# Patient Record
Sex: Male | Born: 1970 | Hispanic: No | Marital: Single | Smoking: Current every day smoker
Health system: Southern US, Community
[De-identification: ages and names within clinical notes are randomized; demographics above are authoritative.]

## PROBLEM LIST (undated history)

## (undated) DIAGNOSIS — E119 Type 2 diabetes mellitus without complications: Secondary | ICD-10-CM

## (undated) DIAGNOSIS — E785 Hyperlipidemia, unspecified: Secondary | ICD-10-CM

## (undated) DIAGNOSIS — I639 Cerebral infarction, unspecified: Secondary | ICD-10-CM

---

## 1999-03-10 ENCOUNTER — Emergency Department (HOSPITAL_COMMUNITY): Admission: EM | Admit: 1999-03-10 | Discharge: 1999-03-10 | Payer: Self-pay | Admitting: Emergency Medicine

## 1999-03-10 ENCOUNTER — Encounter: Payer: Self-pay | Admitting: Emergency Medicine

## 1999-03-21 ENCOUNTER — Encounter: Admission: RE | Admit: 1999-03-21 | Discharge: 1999-03-21 | Payer: Self-pay | Admitting: *Deleted

## 2005-01-24 ENCOUNTER — Emergency Department (HOSPITAL_COMMUNITY): Admission: EM | Admit: 2005-01-24 | Discharge: 2005-01-24 | Payer: Self-pay | Admitting: Emergency Medicine

## 2016-08-19 ENCOUNTER — Emergency Department: Payer: Self-pay

## 2016-08-19 ENCOUNTER — Emergency Department
Admission: EM | Admit: 2016-08-19 | Discharge: 2016-08-19 | Disposition: A | Discharge status: Home / Self Care | Payer: Self-pay | Attending: Emergency Medicine | Admitting: Emergency Medicine

## 2016-08-19 ENCOUNTER — Encounter: Payer: Self-pay | Admitting: Emergency Medicine

## 2016-08-19 DIAGNOSIS — F17200 Nicotine dependence, unspecified, uncomplicated: Secondary | ICD-10-CM | POA: Insufficient documentation

## 2016-08-19 DIAGNOSIS — E0800 Diabetes mellitus due to underlying condition with hyperosmolarity without nonketotic hyperglycemic-hyperosmolar coma (NKHHC): Secondary | ICD-10-CM

## 2016-08-19 DIAGNOSIS — E1100 Type 2 diabetes mellitus with hyperosmolarity without nonketotic hyperglycemic-hyperosmolar coma (NKHHC): Secondary | ICD-10-CM | POA: Insufficient documentation

## 2016-08-19 DIAGNOSIS — L03211 Cellulitis of face: Secondary | ICD-10-CM | POA: Insufficient documentation

## 2016-08-19 HISTORY — DX: Type 2 diabetes mellitus without complications: E11.9

## 2016-08-19 LAB — CBC WITH DIFFERENTIAL/PLATELET
Basophils Absolute: 0.1 10*3/uL (ref 0–0.1)
Basophils Relative: 1 %
Eosinophils Absolute: 0.7 10*3/uL (ref 0–0.7)
Eosinophils Relative: 5 %
HCT: 48.3 % (ref 40.0–52.0)
Hemoglobin: 16.5 g/dL (ref 13.0–18.0)
Lymphocytes Relative: 13 %
Lymphs Abs: 1.9 10*3/uL (ref 1.0–3.6)
MCH: 30.4 pg (ref 26.0–34.0)
MCHC: 34.2 g/dL (ref 32.0–36.0)
MCV: 88.9 fL (ref 80.0–100.0)
Monocytes Absolute: 0.8 10*3/uL (ref 0.2–1.0)
Monocytes Relative: 6 %
Neutro Abs: 10.6 10*3/uL — ABNORMAL HIGH (ref 1.4–6.5)
Neutrophils Relative %: 75 %
Platelets: 347 10*3/uL (ref 150–440)
RBC: 5.43 MIL/uL (ref 4.40–5.90)
RDW: 13.2 % (ref 11.5–14.5)
WBC: 14.2 10*3/uL — ABNORMAL HIGH (ref 3.8–10.6)

## 2016-08-19 LAB — BASIC METABOLIC PANEL
Anion gap: 7 (ref 5–15)
BUN: 12 mg/dL (ref 6–20)
CO2: 28 mmol/L (ref 22–32)
Calcium: 8.9 mg/dL (ref 8.9–10.3)
Chloride: 101 mmol/L (ref 101–111)
Creatinine, Ser: 0.83 mg/dL (ref 0.61–1.24)
GFR calc Af Amer: >60 mL/min (ref >60)
GFR calc non Af Amer: >60 mL/min (ref >60)
Glucose, Bld: 315 mg/dL — ABNORMAL HIGH (ref 65–99)
Potassium: 3.9 mmol/L (ref 3.5–5.1)
Sodium: 136 mmol/L (ref 135–145)

## 2016-08-19 MED ORDER — INSULIN ASPART 100 UNIT/ML ~~LOC~~ SOLN
20.0000 [IU] | Freq: Once | SUBCUTANEOUS | Status: AC
  Administered 2016-08-19: 20 [IU] via INTRAVENOUS
  Filled 2016-08-19: qty 20

## 2016-08-19 MED ORDER — METFORMIN HCL 500 MG PO TABS: 500.0000 mg | ORAL_TABLET | Freq: Two times a day (BID) | ORAL | 11 refills | Status: DC

## 2016-08-19 MED ORDER — IOPAMIDOL (ISOVUE-300) INJECTION 61%
75.0000 mL | Freq: Once | INTRAVENOUS | Status: AC | PRN
  Administered 2016-08-19: 75 mL via INTRAVENOUS
  Filled 2016-08-19: qty 75

## 2016-08-19 MED ORDER — OXYCODONE-ACETAMINOPHEN 5-325 MG PO TABS: 1.0000 | ORAL_TABLET | Freq: Four times a day (QID) | ORAL | 0 refills | Status: AC | PRN

## 2016-08-19 MED ORDER — OXYCODONE-ACETAMINOPHEN 5-325 MG PO TABS
1.0000 | ORAL_TABLET | Freq: Once | ORAL | Status: AC
  Administered 2016-08-19: 1 via ORAL
  Filled 2016-08-19: qty 1

## 2016-08-19 MED ORDER — SODIUM CHLORIDE 0.9 % IV BOLUS (SEPSIS)
1000.0000 mL | Freq: Once | INTRAVENOUS | Status: AC
  Administered 2016-08-19: 1000 mL via INTRAVENOUS

## 2016-08-19 MED ORDER — IBUPROFEN 600 MG PO TABS
600.0000 mg | ORAL_TABLET | Freq: Once | ORAL | Status: AC
  Administered 2016-08-19: 600 mg via ORAL
  Filled 2016-08-19: qty 1

## 2016-08-19 MED ORDER — IBUPROFEN 600 MG PO TABS: 600.0000 mg | ORAL_TABLET | Freq: Three times a day (TID) | ORAL | 0 refills | Status: DC | PRN

## 2016-08-19 NOTE — ED Provider Notes (Signed)
[facility] Emergency Department Provider Note   ____________________________________________   None    (approximate)  I have reviewed the triage vital signs and the nursing notes.   HISTORY  Chief Complaint Abscess    HPI Phillip Camacho is a 45 y.o. male patient presents to the ED with right inferior lateral facial edema. Patient state the edema started on Tuesday.Patient saw her PCP yesterday and was given Rocephin 1 g IM. Patient also was given antibiotics. Patient states no improvement since yesterday. Patient rates pain as a 6/10.   Past Medical History:  Diagnosis Date  . Diabetes mellitus without complication ([place])     There are no active problems to display for this patient.   History reviewed. No pertinent surgical history.  Prior to Admission medications   Medication Sig Start Date End Date Taking? Authorizing Provider  ibuprofen (ADVIL,MOTRIN) 600 MG tablet Take 1 tablet (600 mg total) by mouth every 8 (eight) hours as needed. 08/19/16   Sable Feil, PA-C  metFORMIN (GLUCOPHAGE) 500 MG tablet Take 1 tablet (500 mg total) by mouth 2 (two) times daily with a meal. 08/19/16 08/19/17  Sable Feil, PA-C  oxyCODONE-acetaminophen (ROXICET) 5-325 MG tablet Take 1 tablet by mouth every 6 (six) hours as needed. 08/19/16 08/19/17  Sable Feil, PA-C    Allergies Review of patient's allergies indicates no known allergies.  No family history on file.  Social History Social History  Substance Use Topics  . Smoking status: Current Every Day Smoker  . Smokeless tobacco: Never Used  . Alcohol use No    Review of Systems Constitutional: No fever/chills Eyes: No visual changes. ENT: No sore throat. Cardiovascular: Denies chest pain. Respiratory: Denies shortness of breath. Gastrointestinal: No abdominal pain.  No nausea, no vomiting.  No diarrhea.  No constipation. Genitourinary: Negative for dysuria. Musculoskeletal: Negative for  back pain. Skin: Negative for rash. Neurological: Negative for headaches, focal weakness or numbness. Endocrine:Diabetes ___________________________________________   PHYSICAL EXAM:  VITAL SIGNS: ED Triage Vitals  Enc Vitals Group     BP 08/19/16 1212 (!) 181/72     Pulse Rate 08/19/16 1212 96     Resp 08/19/16 1212 16     Temp 08/19/16 1212 98.7 F (37.1 C)     Temp Source 08/19/16 1212 Oral     SpO2 08/19/16 1212 98 %     Weight 08/19/16 1211 136 lb (61.7 kg)     Height 08/19/16 1211 5\' 6"  (1.676 m)     Head Circumference --      Peak Flow --      Pain Score 08/19/16 1212 6     Pain Loc --      Pain Edu? --      Excl. in [place]? --     Constitutional: Alert and oriented. Well appearing and in no acute distress. Eyes: Conjunctivae are normal. PERRL. EOMI. Head: Atraumatic. Nose: No congestion/rhinnorhea. Mouth/Throat: Mucous membranes are moist.  Oropharynx non-erythematous.Edema to the right lateral mandible. Neck: No stridor.  No cervical spine tenderness to palpation. Hematological/Lymphatic/Immunilogical: No cervical lymphadenopathy. Cardiovascular: Normal rate, regular rhythm. Grossly normal heart sounds.  Good peripheral circulation.Elevated blood pressure Respiratory: Normal respiratory effort.  No retractions. Lungs CTAB. Gastrointestinal: Soft and nontender. No distention. No abdominal bruits. No CVA tenderness. Musculoskeletal: No lower extremity tenderness nor edema.  No joint effusions. Neurologic:  Normal speech and language. No gross focal neurologic deficits are appreciated. No gait instability. Skin:  Skin is warm,  dry and intact. No rash noted. Psychiatric: Mood and affect are normal. Speech and behavior are normal.  ____________________________________________   LABS (all labs ordered are listed, but only abnormal results are displayed)  Labs Reviewed  BASIC METABOLIC PANEL - Abnormal; Notable for the following:       Result Value   Glucose, Bld 315  (*)    All other components within normal limits  CBC WITH DIFFERENTIAL/PLATELET - Abnormal; Notable for the following:    WBC 14.2 (*)    Neutro Abs 10.6 (*)    All other components within normal limits   ____________________________________________  EKG   ____________________________________________  RADIOLOGY  Maxillofacial CT with contrast is consistent with cellulitis but no true abscess. ____________________________________________   PROCEDURES  Procedure(s) performed: None  Procedures  Critical Care performed: No  ____________________________________________   INITIAL IMPRESSION / ASSESSMENT AND PLAN / ED COURSE  Pertinent labs & imaging results that were available during my care of the patient were reviewed by me and considered in my medical decision making (see chart for details).  Facial cellulitis. Patient advised to continue antibiotics. Patient given discharge care instructions. Patient given a prescription for Percocet, ibuprofen, and metformin. [facility]  Clinical Course   Patient CT scan consistent with cellulitis but no true abscess. Patient relates that he has not been on metformin for 2-3 months. His elevated glucose patient was given sodium chloride bolus 20 units insulin.  ____________________________________________   FINAL CLINICAL IMPRESSION(S) / ED DIAGNOSES  Final diagnoses:  Facial cellulitis  Diabetes mellitus due to underlying condition with hyperosmolarity without coma, without long-term current use of insulin (HCC)      NEW MEDICATIONS STARTED DURING THIS VISIT:  New Prescriptions   IBUPROFEN (ADVIL,MOTRIN) 600 MG TABLET    Take 1 tablet (600 mg total) by mouth every 8 (eight) hours as needed.   METFORMIN (GLUCOPHAGE) 500 MG TABLET    Take 1 tablet (500 mg total) by mouth 2 (two) times daily with a meal.   OXYCODONE-ACETAMINOPHEN (ROXICET) 5-325 MG TABLET    Take 1  tablet by mouth every 6 (six) hours as needed.     Note:  This document was prepared using Dragon voice recognition software and may include unintentional dictation errors.    Sable Feil, PA-C 08/19/16 [place], PA-C 08/19/16 [place], MD 08/23/16 [phone]

## 2016-08-19 NOTE — ED Triage Notes (Signed)
Presents with swelling to right side of face   States swelling started on tuesday  Became worse yesterday   Was seen by PCP   Given rocephin shot and po antibiotics

## 2016-08-19 NOTE — Discharge Instructions (Signed)
Continue previous medications. Advised to [facility] to [facility] care medical conditions.

## 2019-08-11 ENCOUNTER — Emergency Department (HOSPITAL_COMMUNITY): Payer: BLUE CROSS/BLUE SHIELD

## 2019-08-11 ENCOUNTER — Encounter (HOSPITAL_COMMUNITY): Payer: Self-pay

## 2019-08-11 ENCOUNTER — Other Ambulatory Visit: Payer: Self-pay

## 2019-08-11 ENCOUNTER — Inpatient Hospital Stay (HOSPITAL_COMMUNITY): Payer: BLUE CROSS/BLUE SHIELD

## 2019-08-11 ENCOUNTER — Inpatient Hospital Stay (HOSPITAL_COMMUNITY)
Admission: EM | Admit: 2019-08-11 | Discharge: 2019-08-14 | DRG: 040 | Disposition: A | Discharge status: Home / Self Care | Payer: BLUE CROSS/BLUE SHIELD | Attending: Internal Medicine | Admitting: Internal Medicine

## 2019-08-11 DIAGNOSIS — I63413 Cerebral infarction due to embolism of bilateral middle cerebral arteries: Principal | ICD-10-CM | POA: Diagnosis present

## 2019-08-11 DIAGNOSIS — R471 Dysarthria and anarthria: Secondary | ICD-10-CM | POA: Diagnosis present

## 2019-08-11 DIAGNOSIS — Z972 Presence of dental prosthetic device (complete) (partial): Secondary | ICD-10-CM | POA: Diagnosis not present

## 2019-08-11 DIAGNOSIS — N179 Acute kidney failure, unspecified: Secondary | ICD-10-CM | POA: Diagnosis present

## 2019-08-11 DIAGNOSIS — Z7984 Long term (current) use of oral hypoglycemic drugs: Secondary | ICD-10-CM

## 2019-08-11 DIAGNOSIS — E1169 Type 2 diabetes mellitus with other specified complication: Secondary | ICD-10-CM

## 2019-08-11 DIAGNOSIS — E1122 Type 2 diabetes mellitus with diabetic chronic kidney disease: Secondary | ICD-10-CM | POA: Diagnosis present

## 2019-08-11 DIAGNOSIS — N183 Chronic kidney disease, stage 3 unspecified: Secondary | ICD-10-CM | POA: Diagnosis present

## 2019-08-11 DIAGNOSIS — E1165 Type 2 diabetes mellitus with hyperglycemia: Secondary | ICD-10-CM | POA: Diagnosis present

## 2019-08-11 DIAGNOSIS — F17210 Nicotine dependence, cigarettes, uncomplicated: Secondary | ICD-10-CM | POA: Diagnosis present

## 2019-08-11 DIAGNOSIS — J90 Pleural effusion, not elsewhere classified: Secondary | ICD-10-CM | POA: Diagnosis present

## 2019-08-11 DIAGNOSIS — N184 Chronic kidney disease, stage 4 (severe): Secondary | ICD-10-CM | POA: Insufficient documentation

## 2019-08-11 DIAGNOSIS — I129 Hypertensive chronic kidney disease with stage 1 through stage 4 chronic kidney disease, or unspecified chronic kidney disease: Secondary | ICD-10-CM | POA: Diagnosis present

## 2019-08-11 DIAGNOSIS — E785 Hyperlipidemia, unspecified: Secondary | ICD-10-CM | POA: Diagnosis present

## 2019-08-11 DIAGNOSIS — I639 Cerebral infarction, unspecified: Secondary | ICD-10-CM | POA: Diagnosis present

## 2019-08-11 DIAGNOSIS — I361 Nonrheumatic tricuspid (valve) insufficiency: Secondary | ICD-10-CM | POA: Diagnosis not present

## 2019-08-11 DIAGNOSIS — Z720 Tobacco use: Secondary | ICD-10-CM | POA: Diagnosis present

## 2019-08-11 DIAGNOSIS — E876 Hypokalemia: Secondary | ICD-10-CM | POA: Diagnosis present

## 2019-08-11 DIAGNOSIS — R739 Hyperglycemia, unspecified: Secondary | ICD-10-CM

## 2019-08-11 DIAGNOSIS — I700 Atherosclerosis of aorta: Secondary | ICD-10-CM | POA: Diagnosis present

## 2019-08-11 DIAGNOSIS — N189 Chronic kidney disease, unspecified: Secondary | ICD-10-CM | POA: Diagnosis present

## 2019-08-11 DIAGNOSIS — Z9114 Patient's other noncompliance with medication regimen: Secondary | ICD-10-CM

## 2019-08-11 DIAGNOSIS — E119 Type 2 diabetes mellitus without complications: Secondary | ICD-10-CM

## 2019-08-11 DIAGNOSIS — R29810 Facial weakness: Secondary | ICD-10-CM | POA: Diagnosis present

## 2019-08-11 DIAGNOSIS — R29705 NIHSS score 5: Secondary | ICD-10-CM | POA: Diagnosis present

## 2019-08-11 DIAGNOSIS — G9341 Metabolic encephalopathy: Secondary | ICD-10-CM | POA: Diagnosis present

## 2019-08-11 DIAGNOSIS — Z20828 Contact with and (suspected) exposure to other viral communicable diseases: Secondary | ICD-10-CM | POA: Diagnosis present

## 2019-08-11 DIAGNOSIS — I160 Hypertensive urgency: Secondary | ICD-10-CM | POA: Diagnosis present

## 2019-08-11 DIAGNOSIS — G8194 Hemiplegia, unspecified affecting left nondominant side: Secondary | ICD-10-CM | POA: Diagnosis present

## 2019-08-11 DIAGNOSIS — I10 Essential (primary) hypertension: Secondary | ICD-10-CM

## 2019-08-11 DIAGNOSIS — I340 Nonrheumatic mitral (valve) insufficiency: Secondary | ICD-10-CM | POA: Diagnosis not present

## 2019-08-11 HISTORY — DX: Type 2 diabetes mellitus with diabetic chronic kidney disease: E11.22

## 2019-08-11 LAB — I-STAT CHEM 8, ED
BUN: 22 mg/dL — ABNORMAL HIGH (ref 6–20)
Calcium, Ion: 1.15 mmol/L (ref 1.15–1.40)
Chloride: 96 mmol/L — ABNORMAL LOW (ref 98–111)
Creatinine, Ser: 2.6 mg/dL — ABNORMAL HIGH (ref 0.61–1.24)
Glucose, Bld: 440 mg/dL — ABNORMAL HIGH (ref 70–99)
HCT: 43 % (ref 39.0–52.0)
Hemoglobin: 14.6 g/dL (ref 13.0–17.0)
Potassium: 3.3 mmol/L — ABNORMAL LOW (ref 3.5–5.1)
Sodium: 133 mmol/L — ABNORMAL LOW (ref 135–145)
TCO2: 27 mmol/L (ref 22–32)

## 2019-08-11 LAB — DIFFERENTIAL
Abs Immature Granulocytes: 0.04 10*3/uL (ref 0.00–0.07)
Basophils Absolute: 0.1 10*3/uL (ref 0.0–0.1)
Basophils Relative: 1 %
Eosinophils Absolute: 0.6 10*3/uL — ABNORMAL HIGH (ref 0.0–0.5)
Eosinophils Relative: 5 %
Immature Granulocytes: 0 %
Lymphocytes Relative: 17 %
Lymphs Abs: 1.9 10*3/uL (ref 0.7–4.0)
Monocytes Absolute: 0.6 10*3/uL (ref 0.1–1.0)
Monocytes Relative: 5 %
Neutro Abs: 8.1 10*3/uL — ABNORMAL HIGH (ref 1.7–7.7)
Neutrophils Relative %: 72 %

## 2019-08-11 LAB — ETHANOL: Alcohol, Ethyl (B): <10 mg/dL (ref <10)

## 2019-08-11 LAB — RAPID URINE DRUG SCREEN, HOSP PERFORMED
Amphetamines: NOT DETECTED
Barbiturates: NOT DETECTED
Benzodiazepines: NOT DETECTED
Cocaine: NOT DETECTED
Opiates: NOT DETECTED
Tetrahydrocannabinol: NOT DETECTED

## 2019-08-11 LAB — URINALYSIS, ROUTINE W REFLEX MICROSCOPIC
Bacteria, UA: NONE SEEN
Bilirubin Urine: NEGATIVE
Glucose, UA: >=500 mg/dL — AB
Ketones, ur: NEGATIVE mg/dL
Leukocytes,Ua: NEGATIVE
Nitrite: NEGATIVE
Protein, ur: >=300 mg/dL — AB
Specific Gravity, Urine: 1.011 (ref 1.005–1.030)
pH: 7 (ref 5.0–8.0)

## 2019-08-11 LAB — CBG MONITORING, ED
Glucose-Capillary: 434 mg/dL — ABNORMAL HIGH (ref 70–99)
Glucose-Capillary: 297 mg/dL — ABNORMAL HIGH (ref 70–99)

## 2019-08-11 LAB — CBC
HCT: 41.7 % (ref 39.0–52.0)
Hemoglobin: 13.4 g/dL (ref 13.0–17.0)
MCH: 28 pg (ref 26.0–34.0)
MCHC: 32.1 g/dL (ref 30.0–36.0)
MCV: 87.1 fL (ref 80.0–100.0)
Platelets: 494 10*3/uL — ABNORMAL HIGH (ref 150–400)
RBC: 4.79 MIL/uL (ref 4.22–5.81)
RDW: 14 % (ref 11.5–15.5)
WBC: 11.3 10*3/uL — ABNORMAL HIGH (ref 4.0–10.5)
nRBC: 0 % (ref 0.0–0.2)

## 2019-08-11 LAB — COMPREHENSIVE METABOLIC PANEL
ALT: 10 U/L (ref 0–44)
AST: 7 U/L — ABNORMAL LOW (ref 15–41)
Albumin: 1.8 g/dL — ABNORMAL LOW (ref 3.5–5.0)
Alkaline Phosphatase: 112 U/L (ref 38–126)
Anion gap: 6 (ref 5–15)
BUN: 22 mg/dL — ABNORMAL HIGH (ref 6–20)
CO2: 27 mmol/L (ref 22–32)
Calcium: 8.1 mg/dL — ABNORMAL LOW (ref 8.9–10.3)
Chloride: 97 mmol/L — ABNORMAL LOW (ref 98–111)
Creatinine, Ser: 2.72 mg/dL — ABNORMAL HIGH (ref 0.61–1.24)
GFR calc Af Amer: 31 mL/min — ABNORMAL LOW (ref >60)
GFR calc non Af Amer: 26 mL/min — ABNORMAL LOW (ref >60)
Glucose, Bld: 446 mg/dL — ABNORMAL HIGH (ref 70–99)
Potassium: 3.2 mmol/L — ABNORMAL LOW (ref 3.5–5.1)
Sodium: 130 mmol/L — ABNORMAL LOW (ref 135–145)
Total Bilirubin: 0.3 mg/dL (ref 0.3–1.2)
Total Protein: 5.7 g/dL — ABNORMAL LOW (ref 6.5–8.1)

## 2019-08-11 LAB — BRAIN NATRIURETIC PEPTIDE: B Natriuretic Peptide: 591.6 pg/mL — ABNORMAL HIGH (ref 0.0–100.0)

## 2019-08-11 LAB — PROTIME-INR
INR: 0.8 (ref 0.8–1.2)
Prothrombin Time: 11.4 seconds (ref 11.4–15.2)

## 2019-08-11 LAB — SARS CORONAVIRUS 2 (TAT 6-24 HRS): SARS Coronavirus 2: NEGATIVE

## 2019-08-11 LAB — GLUCOSE, CAPILLARY: Glucose-Capillary: 226 mg/dL — ABNORMAL HIGH (ref 70–99)

## 2019-08-11 LAB — T4, FREE: Free T4: 1.08 ng/dL (ref 0.61–1.12)

## 2019-08-11 LAB — TROPONIN I (HIGH SENSITIVITY)
Troponin I (High Sensitivity): 17 ng/L (ref <18)
Troponin I (High Sensitivity): 18 ng/L — ABNORMAL HIGH (ref <18)

## 2019-08-11 LAB — APTT: aPTT: 31 seconds (ref 24–36)

## 2019-08-11 LAB — TSH: TSH: 2.16 u[IU]/mL (ref 0.350–4.500)

## 2019-08-11 MED ORDER — HEPARIN SODIUM (PORCINE) 5000 UNIT/ML IJ SOLN
5000.0000 [IU] | Freq: Three times a day (TID) | INTRAMUSCULAR | Status: DC
  Administered 2019-08-11 – 2019-08-14 (×8): 5000 [IU] via SUBCUTANEOUS
  Filled 2019-08-11 (×8): qty 1

## 2019-08-11 MED ORDER — ASPIRIN 325 MG PO TABS
325.0000 mg | ORAL_TABLET | Freq: Every day | ORAL | Status: DC
  Administered 2019-08-11 – 2019-08-14 (×4): 325 mg via ORAL
  Filled 2019-08-11 (×4): qty 1

## 2019-08-11 MED ORDER — INSULIN GLARGINE 100 UNIT/ML ~~LOC~~ SOLN
10.0000 [IU] | SUBCUTANEOUS | Status: DC
  Administered 2019-08-11: 18:00:00 10 [IU] via SUBCUTANEOUS
  Filled 2019-08-11 (×2): qty 0.1

## 2019-08-11 MED ORDER — ASPIRIN 300 MG RE SUPP: 300.0000 mg | Freq: Every day | RECTAL | Status: DC

## 2019-08-11 MED ORDER — LABETALOL HCL 5 MG/ML IV SOLN
10.0000 mg | Freq: Once | INTRAVENOUS | Status: AC
  Administered 2019-08-11: 11:00:00 10 mg via INTRAVENOUS
  Filled 2019-08-11: qty 4

## 2019-08-11 MED ORDER — NICOTINE 21 MG/24HR TD PT24
21.0000 mg | MEDICATED_PATCH | Freq: Every day | TRANSDERMAL | Status: DC
  Administered 2019-08-11 – 2019-08-14 (×4): 21 mg via TRANSDERMAL
  Filled 2019-08-11 (×4): qty 1

## 2019-08-11 MED ORDER — SENNOSIDES-DOCUSATE SODIUM 8.6-50 MG PO TABS: 1.0000 | ORAL_TABLET | Freq: Every evening | ORAL | Status: DC | PRN

## 2019-08-11 MED ORDER — STROKE: EARLY STAGES OF RECOVERY BOOK
Freq: Once | Status: AC
  Administered 2019-08-11: 22:00:00
  Filled 2019-08-11 (×2): qty 1

## 2019-08-11 MED ORDER — KCL IN DEXTROSE-NACL 20-5-0.45 MEQ/L-%-% IV SOLN
Freq: Once | INTRAVENOUS | Status: AC
  Administered 2019-08-11: 13:00:00 via INTRAVENOUS
  Filled 2019-08-11: qty 1000

## 2019-08-11 MED ORDER — ACETAMINOPHEN 325 MG PO TABS
650.0000 mg | ORAL_TABLET | ORAL | Status: DC | PRN
  Administered 2019-08-13: 23:00:00 650 mg via ORAL
  Filled 2019-08-11: qty 2

## 2019-08-11 MED ORDER — ACETAMINOPHEN 650 MG RE SUPP: 650.0000 mg | RECTAL | Status: DC | PRN

## 2019-08-11 MED ORDER — ASPIRIN 81 MG PO CHEW
324.0000 mg | CHEWABLE_TABLET | Freq: Once | ORAL | Status: AC
  Administered 2019-08-11: 12:00:00 324 mg via ORAL
  Filled 2019-08-11: qty 4

## 2019-08-11 MED ORDER — INSULIN ASPART 100 UNIT/ML ~~LOC~~ SOLN
6.0000 [IU] | Freq: Once | SUBCUTANEOUS | Status: AC
  Administered 2019-08-11: 6 [IU] via SUBCUTANEOUS
  Filled 2019-08-11: qty 0.06

## 2019-08-11 MED ORDER — SODIUM CHLORIDE 0.9 % IV SOLN
INTRAVENOUS | Status: DC
  Administered 2019-08-11 – 2019-08-12 (×2): via INTRAVENOUS

## 2019-08-11 MED ORDER — INSULIN ASPART 100 UNIT/ML ~~LOC~~ SOLN
0.0000 [IU] | Freq: Three times a day (TID) | SUBCUTANEOUS | Status: DC
  Administered 2019-08-11: 17:00:00 5 [IU] via SUBCUTANEOUS
  Administered 2019-08-12: 09:00:00 7 [IU] via SUBCUTANEOUS
  Filled 2019-08-11: qty 0.09

## 2019-08-11 MED ORDER — ACETAMINOPHEN 160 MG/5ML PO SOLN: 650.0000 mg | ORAL | Status: DC | PRN

## 2019-08-11 NOTE — H&P (Signed)
History and Physical  Phillip Camacho [id number] DOB: November 11, 1970 DOA: 08/11/2019   Patient coming from: Home & is able to ambulate  Chief Complaint:   HPI: Phillip Camacho is a 48 y.o. male with medical history significant for hypertension, diabetes, tobacco abuse, presents to the ED after son found patient to be very confused, slurred speech, drooling, left-sided facial droop, left-sided weakness, with some left-sided chest pain this a.m. Son last saw patient well around 8 PM on 08/10/2019.  On initial presentation, patient was noted to be altered, with significant slurred speech.  By my interview, patient was more awake, able to answer simple questions in [place], son at bedside.  Patient complains of reproducible left-sided chest pain, reports has been ongoing for about a couple of weeks, nothing makes it better or worse.  Denies any associated nausea/vomiting, shortness of breath, abdominal pain, dysuria, cough/sore throat.  Patient has not seen a doctor in years.  Patient was scheduled to see nephrology at [facility] next month.  ED Course: Afebrile, noted to be in hypertensive crisis with BP 228/95, saturating well on room air, labs showed glucose of 446, normal bicarb/anion gap, creatinine of 2.72, BNP 591.6, troponin 17-->18, WBC 11.3, UDS negative, all level negative, UA negative except for glucose greater than 500, as well as protein greater than 300.  CT head showed recent infarct in the right cerebellum.  Teleneurology was consulted.  MRI pending, if positive will be transferred to [facility] for further evaluation.  Review of Systems: Review of systems are otherwise negative   Past Medical History:  Diagnosis Date  . Diabetes mellitus without complication ([place])    History reviewed. No pertinent surgical history.  Social History:  reports that he has been smoking. He has never used smokeless tobacco. He reports that he does not drink alcohol. No history on file  for drug.   No Known Allergies  History reviewed. No pertinent family history.    Prior to Admission medications   Medication Sig Start Date End Date Taking? Authorizing Provider  furosemide (LASIX) 80 MG tablet Take 1 tablet by mouth daily as needed for fluid. 07/21/19  Yes [provider]  ibuprofen (ADVIL,MOTRIN) 600 MG tablet Take 1 tablet (600 mg total) by mouth every 8 (eight) hours as needed. Patient not taking: Reported on 08/11/2019 08/19/16   Sable Feil, PA-C  metFORMIN (GLUCOPHAGE) 500 MG tablet Take 1 tablet (500 mg total) by mouth 2 (two) times daily with a meal. 08/19/16 08/19/17  Sable Feil, PA-C    Physical Exam: BP (!) 181/81 Comment: Simultaneous filing. User may not have seen previous data.  Pulse 86   Temp 97.9 F (36.6 C) (Oral)   Resp 18   Wt 61.7 kg   SpO2 100%   BMI 21.95 kg/m   General: NAD, chronically ill-appearing Eyes: Normal ENT: Normal Neck: Supple Cardiovascular: S1, S2 present Respiratory: CTA B Abdomen: Soft, nontender, nondistended, bowel sounds present Skin: Normal Musculoskeletal: Trace bilateral pedal edema noted Psychiatric: Normal mood Neurologic: Left facial droop noted, slurred speech, 4/5 in left upper/left lowerextremity, 5/5 strength in the right upper and lower extremity          Labs on Admission:  Basic Metabolic Panel: Recent Labs  Lab 08/11/19 1048 08/11/19 1054  NA 130* 133*  K 3.2* 3.3*  CL 97* 96*  CO2 27  --   GLUCOSE 446* 440*  BUN 22* 22*  CREATININE 2.72* 2.60*  CALCIUM 8.1*  --  Liver Function Tests: Recent Labs  Lab 08/11/19 1048  AST 7*  ALT 10  ALKPHOS 112  BILITOT 0.3  PROT 5.7*  ALBUMIN 1.8*   No results for input(s): LIPASE, AMYLASE in the last 168 hours. No results for input(s): AMMONIA in the last 168 hours. CBC: Recent Labs  Lab 08/11/19 1048 08/11/19 1054  WBC 11.3*  --   NEUTROABS 8.1*  --   HGB 13.4 14.6  HCT 41.7 43.0  MCV 87.1  --   PLT 494*  --     Cardiac Enzymes: No results for input(s): CKTOTAL, CKMB, CKMBINDEX, TROPONINI in the last 168 hours.  BNP (last 3 results) Recent Labs    08/11/19 1050  BNP 591.6*    ProBNP (last 3 results) No results for input(s): PROBNP in the last 8760 hours.  CBG: Recent Labs  Lab 08/11/19 1041  GLUCAP 434*    Radiological Exams on Admission: Ct Head Wo Contrast  Result Date: 08/11/2019 CLINICAL DATA:  Left-sided facial droop with slurred speech. Left-sided weakness EXAM: CT HEAD WITHOUT CONTRAST TECHNIQUE: Contiguous axial images were obtained from the base of the skull through the vertex without intravenous contrast. COMPARISON:  None. FINDINGS: Brain: Ventricles are normal in size and configuration. There is a small cavum septum pellucidum, an anatomic variant. There is no intracranial mass, hemorrhage, extra-axial fluid collection, or midline shift. There is a focal infarct in the posterior aspect of the dentate nucleus on the right medially which may be recent. No other focal infarct evident. Vascular: No hyperdense vessel evident. There is calcification in each carotid siphon region. Skull: The bony calvarium appears intact. Sinuses/Orbits: There is mucosal thickening in several ethmoid air cells. Other visualized paranasal sinuses are clear. Orbits appear symmetric bilaterally. There is rightward deviation of the nasal septum. Other: Visualized mastoid air cells are clear. IMPRESSION: Age uncertain and potentially recent infarct in the posteromedial aspect of the dentate nucleus of the right cerebellum. No other parenchymal abnormality to suggest infarct noted. No mass or hemorrhage. There are foci of arterial vascular calcification. There is mucosal thickening in several ethmoid air cells. There is rightward deviation of the nasal septum. Electronically Signed   By: Lowella Grip III M.D.   On: 08/11/2019 11:08    EKG: Independently reviewed.  Sinus rhythm, no acute ST changes   Assessment/Plan Present on Admission: . Acute CVA (cerebrovascular accident) ([place]) . Essential hypertension . Tobacco abuse  Principal Problem:   Acute CVA (cerebrovascular accident) [facility]) Active Problems:   DM (diabetes mellitus) ([place])   Essential hypertension   Tobacco abuse  ??Acute CVA History as above CT head without contrast showed recent infarct in the right cerebellum MRI brain pending for confirmation VS r/o Lipid panel, A1c pending Echo pending Carotid Doppler pending SLP/PT/OT pending Teleneurology consulted, recommend MRI Spoke to neurology Dr. Cheral Marker, if MRI positive for acute stroke, transfer to [facility] for further management  Hypertensive crisis BP 228/95 on presentation Not on any home meds except for ??Lasix Was given 1 dose of labetalol Will not start any medication to allow for permissive hypertension  Acute metabolic encephalopathy Improving Likely 2/2 above Management as above  Atypical chest pain Reproducible Troponin 17-->18 BNP 591.6 EKG with no acute ST changes Echo pending Telemetry  AKI Likely history of CKD, last creatinine was normal on 07/2016 Patient has been taking ??Lasix UA with >300 protein Renal ultrasound showed medical renal disease, no obstruction focus on either side Avoid nephrotoxic Continue IV fluids Daily BMP  Diabetes mellitus type 2 No A1c on file, currently pending Random blood glucose, 446 on presentation SSI, Accu-Cheks, hypoglycemic protocol Diabetes coordinator  Leukocytosis Likely reactive Afebrile UA negative for infection Chest x-ray pending Trend CBC  Hypokalemia Replace as needed  Tobacco abuse Advised to quit Nicotine patch ordered      DVT prophylaxis: Heparin [place]  Code Status: Full  Family Communication: Son at bedside  Disposition Plan: To be determined  Consults called: Spoke to Dr. Cheral Marker from neurology  Admission status: Inpatient    Alma Friendly MD  [facility]   If 7PM-7AM, please contact night-coverage [email or web address]   08/11/2019, 2:30 PM

## 2019-08-11 NOTE — ED Notes (Signed)
[facility] here for transfer to [place] 

## 2019-08-11 NOTE — ED Notes (Signed)
Awaiting [facility] consult

## 2019-08-11 NOTE — Consult Note (Signed)
[facility] Tele[facility]urology Consult Services  Stat Consult  Date of Service:   08/11/2019 11:07:27  Impression:     .  Rule Out Acute Ischemic Stroke     .  R cerebellar lacunar stroke - subacute; in the setting of hyperglycemia w possible component of metabolic encephalopathy  Comments/Sign-Out: 48 year old man with PMH of DM, HTN and tobacco abuse last known normal yesterday p/w dysarthria, L facial droop and b/l leg weakness and confusion, found w subacte R cerebellar stroke and hyperglycemia likely causing metabolic encephalopathy.  CT HEAD: Reviewed   IMPRESSION: Age uncertain and potentially recent infarct in the posteromedial aspect of the dentate nucleus of the right cerebellum. No other parenchymal abnormality to suggest infarct noted. No mass or hemorrhage.   There are foci of arterial vascular calcification. There is mucosal thickening in several ethmoid air cells. There is rightward deviation of the nasal septum.  Metrics: [facility] Notification Time: 08/11/2019 11:05:52 Stamp Time: 08/11/2019 11:07:27 Callback Response Time: 08/11/2019 11:09:44 Video Start Time: 08/11/2019 11:32:33 Video End Time: 08/11/2019 11:43:08  Our recommendations are outlined below.  Recommendations:     .  Antiplatelet Therapy     .  Initiate Aspirin 325 MG Daily     .  x 1, followed by asa 81mg  daily     .  avoid hyperglycemia     .  permissive htn  Imaging Studies:     .  MRI Head Without Contrast     .  MRA Head and Neck Without Contrast When Available - Stroke Protocol     .  Echocardiogram - Transthoracic Echocardiogram  Therapies:     .  Physical Therapy, Occupational Therapy, Speech Therapy Assessment When Applicable  Other WorkUp:     .  Infectious/metabolic workup per primary team  Disposition: Neurology Follow Up Recommended  Sign Out:     .  Discussed with Emergency Department  Provider  ----------------------------------------------------------------------------------------------------  Chief Complaint: difficulty speaking  History of Present Illness: Patient is a 48 year old Male.  48 year old man with PMH of DM, HTN and tobacco abuse last known normal yesterday am as per son who endorses decreased responsiveness, confusion, left facial droop and difficulty speaking. He smokes tobacco.   Past Medical History:     . Hypertension     . Diabetes Mellitus  Anticoagulant use:  No  Antiplatelet use: No Examination: BP(194/90), Pulse(81), Blood Glucose(434) 1A: Level of Consciousness - Alert; keenly responsive + 0 1B: Ask Month and Age - 1 Question Right + 1 1C: Blink Eyes & Squeeze Hands - Performs Both Tasks + 0 2: Test Horizontal Extraocular Movements - Normal + 0 3: Test Visual Fields - No Visual Loss + 0 4: Test Facial Palsy (Use Grimace if Obtunded) - Minor paralysis (flat nasolabial fold, smile asymmetry) + 1 5A: Test Left Arm Motor Drift - No Drift for 10 Seconds + 0 5B: Test Right Arm Motor Drift - No Drift for 10 Seconds + 0 6A: Test Left Leg Motor Drift - Drift, but doesn't hit bed + 1 6B: Test Right Leg Motor Drift - Drift, but doesn't hit bed + 1 7: Test Limb Ataxia (FNF/Heel-Shin) - No Ataxia + 0 8: Test Sensation - Normal; No sensory loss + 0 9: Test Language/Aphasia - Normal; No aphasia + 0 10: Test Dysarthria - Mild-Moderate Dysarthria: Slurring but can be understood + 1 11: Test Extinction/Inattention - No abnormality + 0  NIHSS Score: 5   Due to  the immediate potential for life-threatening deterioration due to underlying acute neurologic illness, I spent 22 minutes providing critical care. This time includes time for face to face visit via telemedicine, review of medical records, imaging studies and discussion of findings with providers, the patient and/or family.   Dr Barron Schmid   [facility] [phone]    Case [id number]

## 2019-08-11 NOTE — ED Provider Notes (Signed)
[place] DEPT Provider Note   CSN: [id number] Arrival date & time: 08/11/19  1026     History   Chief Complaint Chief Complaint  Patient presents with   Stroke Symptoms    HPI Phillip Camacho is a 48 y.o. male.  He is brought in by ambulance with his son for evaluation of possible strokelike symptoms.  Primarily speaks [place] although is able to speak some English and follow commands.  His son said he saw him last night between 7 and 8 PM to have dinner.  He thought he was moving a little slower than normal.  He checked on him this morning around 715 and said he was less responsive and had slurred speech and was drooling.  He assisted him with ambulation but found that he was leaning towards the left.  The patient himself is pointing to the left side of his chest and saying he has had pain there since 3 days ago.  Denies any trauma.  History of diabetes.  No prior strokes.  History says he is diabetic although the son shows me the only medication he takes since furosemide.  Smoker.     The history is provided by the patient and a relative. The history is limited by a language barrier. A language interpreter was used (family).  Cerebrovascular Accident This is a new problem. The problem has been gradually improving. Associated symptoms include chest pain. Pertinent negatives include no abdominal pain, no headaches and no shortness of breath. Nothing aggravates the symptoms. Nothing relieves the symptoms. He has tried nothing for the symptoms. The treatment provided no relief.    Past Medical History:  Diagnosis Date   Diabetes mellitus without complication ([place])     There are no active problems to display for this patient.   No past surgical history on file.      Home Medications    Prior to Admission medications   Medication Sig Start Date End Date Taking? Authorizing Provider  ibuprofen (ADVIL,MOTRIN) 600 MG tablet Take 1 tablet (600  mg total) by mouth every 8 (eight) hours as needed. 08/19/16   Sable Feil, PA-C  metFORMIN (GLUCOPHAGE) 500 MG tablet Take 1 tablet (500 mg total) by mouth 2 (two) times daily with a meal. 08/19/16 08/19/17  Sable Feil, PA-C    Family History No family history on file.  Social History Social History   Tobacco Use   Smoking status: Current Every Day Smoker   Smokeless tobacco: Never Used  Substance Use Topics   Alcohol use: No   Drug use: Not on file     Allergies   Patient has no known allergies.   Review of Systems Review of Systems  Constitutional: Negative for fever.  HENT: Negative for sore throat.   Eyes: Positive for visual disturbance (blurry).  Respiratory: Negative for shortness of breath.   Cardiovascular: Positive for chest pain.  Gastrointestinal: Negative for abdominal pain.  Genitourinary: Negative for dysuria.  Musculoskeletal: Negative for neck pain.  Skin: Negative for rash.  Neurological: Positive for dizziness. Negative for headaches.     Physical Exam Updated Vital Signs BP (!) 228/95    Pulse 87    Temp 97.9 F (36.6 C) (Oral)    Resp 15    SpO2 100%   Physical Exam Vitals signs and nursing note reviewed.  Constitutional:      Appearance: He is well-developed.  HENT:     Head: Normocephalic and atraumatic.  Eyes:  Conjunctiva/sclera: Conjunctivae normal.  Neck:     Musculoskeletal: Neck supple.  Cardiovascular:     Rate and Rhythm: Normal rate and regular rhythm.     Heart sounds: No murmur.  Pulmonary:     Effort: Pulmonary effort is normal. No respiratory distress.     Breath sounds: Normal breath sounds.  Abdominal:     Palpations: Abdomen is soft.     Tenderness: There is no abdominal tenderness. There is no guarding.  Musculoskeletal: Normal range of motion.        General: No deformity.     Right lower leg: Edema present.     Left lower leg: Edema present.  Skin:    General: Skin is warm and dry.      Capillary Refill: Capillary refill takes less than 2 seconds.  Neurological:     Mental Status: He is alert.     Comments: Patient is awake and alert.  He is following commands.  There may be some subtle left-sided facial droop.  There are some stuttering in his speech but I do not know if this is normal for him.  His son states that his speech is slower and more slurred than normal.  Upper extremity strength 5 x 5 and no pronator drift.  Lower extremity strength 4+ out of 5 and symmetric.      ED Treatments / Results  Labs (all labs ordered are listed, but only abnormal results are displayed) Labs Reviewed  CBC - Abnormal; Notable for the following components:      Result Value   WBC 11.3 (*)    Platelets 494 (*)    All other components within normal limits  DIFFERENTIAL - Abnormal; Notable for the following components:   Neutro Abs 8.1 (*)    Eosinophils Absolute 0.6 (*)    All other components within normal limits  COMPREHENSIVE METABOLIC PANEL - Abnormal; Notable for the following components:   Sodium 130 (*)    Potassium 3.2 (*)    Chloride 97 (*)    Glucose, Bld 446 (*)    BUN 22 (*)    Creatinine, Ser 2.72 (*)    Calcium 8.1 (*)    Total Protein 5.7 (*)    Albumin 1.8 (*)    AST 7 (*)    GFR calc non Af Amer 26 (*)    GFR calc Af Amer 31 (*)    All other components within normal limits  URINALYSIS, ROUTINE W REFLEX MICROSCOPIC - Abnormal; Notable for the following components:   Color, Urine STRAW (*)    Glucose, UA >=500 (*)    Hgb urine dipstick SMALL (*)    Protein, ur >=300 (*)    All other components within normal limits  BRAIN NATRIURETIC PEPTIDE - Abnormal; Notable for the following components:   B Natriuretic Peptide 591.6 (*)    All other components within normal limits  I-STAT CHEM 8, ED - Abnormal; Notable for the following components:   Sodium 133 (*)    Potassium 3.3 (*)    Chloride 96 (*)    BUN 22 (*)    Creatinine, Ser 2.60 (*)    Glucose, Bld  440 (*)    All other components within normal limits  CBG MONITORING, ED - Abnormal; Notable for the following components:   Glucose-Capillary 434 (*)    All other components within normal limits  CBG MONITORING, ED - Abnormal; Notable for the following components:   Glucose-Capillary 297 (*)  All other components within normal limits  TROPONIN I (HIGH SENSITIVITY) - Abnormal; Notable for the following components:   Troponin I (High Sensitivity) 18 (*)    All other components within normal limits  SARS CORONAVIRUS 2 (TAT 6-24 HRS)  CULTURE, BLOOD (ROUTINE X 2)  CULTURE, BLOOD (ROUTINE X 2)  ETHANOL  PROTIME-INR  APTT  RAPID URINE DRUG SCREEN, HOSP PERFORMED  TSH  T4, FREE  HEMOGLOBIN A1C  CBC  BASIC METABOLIC PANEL  TROPONIN I (HIGH SENSITIVITY)    EKG EKG Interpretation  Date/Time:  Monday August 11 2019 10:46:44 EDT Ventricular Rate:  87 PR Interval:    QRS Duration: 80 QT Interval:  376 QTC Calculation: 453 R Axis:   48 Text Interpretation:  Sinus rhythm Probable left atrial enlargement Probable LVH with secondary repol abnrm Baseline wander in lead(s) V5 No old tracing to compare Confirmed by Aletta Edouard [phone]) on 08/11/2019 11:02:33 AM   Radiology Ct Head Wo Contrast  Result Date: 08/11/2019 CLINICAL DATA:  Left-sided facial droop with slurred speech. Left-sided weakness EXAM: CT HEAD WITHOUT CONTRAST TECHNIQUE: Contiguous axial images were obtained from the base of the skull through the vertex without intravenous contrast. COMPARISON:  None. FINDINGS: Brain: Ventricles are normal in size and configuration. There is a small cavum septum pellucidum, an anatomic variant. There is no intracranial mass, hemorrhage, extra-axial fluid collection, or midline shift. There is a focal infarct in the posterior aspect of the dentate nucleus on the right medially which may be recent. No other focal infarct evident. Vascular: No hyperdense vessel evident. There is  calcification in each carotid siphon region. Skull: The bony calvarium appears intact. Sinuses/Orbits: There is mucosal thickening in several ethmoid air cells. Other visualized paranasal sinuses are clear. Orbits appear symmetric bilaterally. There is rightward deviation of the nasal septum. Other: Visualized mastoid air cells are clear. IMPRESSION: Age uncertain and potentially recent infarct in the posteromedial aspect of the dentate nucleus of the right cerebellum. No other parenchymal abnormality to suggest infarct noted. No mass or hemorrhage. There are foci of arterial vascular calcification. There is mucosal thickening in several ethmoid air cells. There is rightward deviation of the nasal septum. Electronically Signed   By: Lowella Grip III M.D.   On: 08/11/2019 11:08   Mr Brain Wo Contrast  Result Date: 08/11/2019 CLINICAL DATA:  Left-sided weakness, facial droop, and slurred speech. History of hypertension and diabetes. EXAM: MRI HEAD WITHOUT CONTRAST TECHNIQUE: Multiplanar, multiecho pulse sequences of the brain and surrounding structures were obtained without intravenous contrast. COMPARISON:  Head CT 08/11/2019 FINDINGS: Brain: There is a 1 cm acute infarct medially in the right cerebellar hemisphere. Additionally, there are scattered punctate acute infarcts involving the left greater than right cerebral hemispheres, predominantly white matter as well as left thalamus. Scattered small foci of T2 hyperintensity elsewhere in the cerebral white matter bilaterally and brainstem/right cerebral peduncle are nonspecific though may reflect mild chronic small vessel ischemic disease given vascular risk factors. Chronic lacunar infarcts are present in the dorsal thalami bilaterally. The ventricles and sulci are within normal limits in size for age. A cavum septum pellucidum is incidentally noted. No intracranial hemorrhage, mass, midline shift, or extra-axial fluid collection is identified. Vascular:  Major intracranial vascular flow voids are preserved. Skull and upper cervical spine: Unremarkable bone marrow signal. Sinuses/Orbits: Unremarkable orbits. Trace left mastoid fluid. Mild left ethmoid air cell mucosal thickening. Other: 2 cm lipoma in the scalp posterior to the right ear. IMPRESSION: 1. Small acute infarcts  in the right cerebellum and both cerebral hemispheres. 2. Mild chronic small vessel ischemic disease. Electronically Signed   By: Logan Bores M.D.   On: 08/11/2019 15:58   US Renal  Result Date: 08/11/2019 CLINICAL DATA:  Acute kidney injury EXAM: RENAL / URINARY TRACT ULTRASOUND COMPLETE COMPARISON:  None. FINDINGS: Right Kidney: Renal measurements: 10.7 x 5.6 x 6.5 cm = volume: 202.2 mL . Echogenicity is increased. Renal cortical thickness is normal. No mass, perinephric fluid, or hydronephrosis visualized. No sonographically demonstrable calculus or ureterectasis. Left Kidney: Renal measurements: 11.3 x 7.4 x 4.4 cm = volume: 192.7 mL. Echogenicity is increased. Renal cortical thickness is normal. No mass, perinephric fluid, or hydronephrosis visualized. No sonographically demonstrable calculus or ureterectasis. Bladder: Appears normal for degree of bladder distention. Other: None IMPRESSION: Increased renal echogenicity, a finding indicative of medical renal disease. Renal cortical thickness normal. No obstructing focus on either side. Study otherwise unremarkable. Electronically Signed   By: Lowella Grip III M.D.   On: 08/11/2019 14:36   Dg Chest Port 1 View  Result Date: 08/11/2019 CLINICAL DATA:  Stroke. EXAM: PORTABLE CHEST 1 VIEW COMPARISON:  None. FINDINGS: The heart size and mediastinal contours are within normal limits. Normal pulmonary vascularity. Small to moderate right and small left pleural effusions with adjacent bibasilar atelectasis. No pneumothorax. No acute osseous abnormality. IMPRESSION: 1. Small to moderate right and small left pleural effusions with  adjacent bibasilar atelectasis. Electronically Signed   By: Titus Dubin M.D.   On: 08/11/2019 15:10    Procedures .Critical Care Performed by: Hayden Rasmussen, MD Authorized by: Hayden Rasmussen, MD   Critical care provider statement:    Critical care time (minutes):  45   Critical care time was exclusive of:  Separately billable procedures and treating other patients   Critical care was necessary to treat or prevent imminent or life-threatening deterioration of the following conditions:  CNS failure or compromise and metabolic crisis   Critical care was time spent personally by me on the following activities:  Discussions with consultants, evaluation of patient's response to treatment, examination of patient, ordering and performing treatments and interventions, ordering and review of laboratory studies, ordering and review of radiographic studies, pulse oximetry, re-evaluation of patient's condition, obtaining history from patient or surrogate, review of old charts and development of treatment plan with patient or surrogate   I assumed direction of critical care for this patient from another provider in my specialty: no     (including critical care time)  Medications Ordered in ED Medications  insulin aspart (novoLOG) injection 0-9 Units (5 Units Subcutaneous Given 08/11/19 1729)  nicotine (NICODERM CQ - dosed in mg/24 hours) patch 21 mg (21 mg Transdermal Patch Applied 08/11/19 1404)  0.9 %  sodium chloride infusion ( Intravenous New Bag/Given 08/11/19 1809)  insulin glargine (LANTUS) injection 10 Units (10 Units Subcutaneous Given 08/11/19 1809)  labetalol (NORMODYNE) injection 10 mg (10 mg Intravenous Given 08/11/19 1108)  aspirin chewable tablet 324 mg (324 mg Oral Given 08/11/19 1207)  insulin aspart (novoLOG) injection 6 Units (6 Units Subcutaneous Given 08/11/19 1210)  dextrose 5 % and 0.45 % NaCl with KCl 20 mEq/L infusion ( Intravenous Stopped 08/11/19 1808)      Initial Impression / Assessment and Plan / ED Course  I have reviewed the triage vital signs and the nursing notes.  Pertinent labs & imaging results that were available during my care of the patient were reviewed by me and considered in my medical  decision making (see chart for details).  Clinical Course as of Aug 10 1912  Mon Aug 10, 6913  2168 48 year old male with possible strokelike symptoms.  Blood pressure also markedly elevated along with glucose.  Differential includes stroke, bleed, malignant hypertension, hyperglycemia, metabolic derangement   [MB]  1115 Blood pressure markedly elevated ordered labetalol 10 mg.  Will reevaluate.  Head CT showing probable stroke   [MB]  1250 Received call back from teleneurology who feels the patient had an acute stroke.  She is recommending aspirin, permissive hypertension, and admission for MRI and further studies.  Discussed with [facility] hospitalist Dr. Horris Latino who will accept the patient for admission, [facility] at [facility]   [MB]    Clinical Course User Index [MB] Hayden Rasmussen, MD       Final Clinical Impressions(s) / ED Diagnoses   Final diagnoses:  Cerebral infarction, unspecified mechanism ([place])  Hypertension, unspecified type  Hyperglycemia  AKI (acute kidney injury) [facility])  Hypokalemia    ED Discharge Orders    None       Hayden Rasmussen, MD 08/11/19 1916

## 2019-08-11 NOTE — ED Notes (Signed)
MRI called. Verified pt is not claustrophobic, no metal or other significant surgical hx

## 2019-08-11 NOTE — ED Notes (Signed)
Son at bedside. Pending Stroke consult on tele monitor.

## 2019-08-11 NOTE — ED Triage Notes (Signed)
Patient BIB EMS from home with son. Patient speaks [place]namese primarily. Patient's son states last known well was approx 2000 08/10/19. Patient son states he checked on his dad and ate dinner with him. Patient's son put dad to bed approx 2000, then checked on his dad this morning approx 0715. Patient's son states dad was "drooling really bad," left side facial droop, slurred speech, "stuttering really bad like he couldn't find his wordss," left sided weakness, and left sided chest pain. Patients son states the patient was able to "barely" walk with assistance.

## 2019-08-11 NOTE — ED Notes (Signed)
Pt off floor in CT 

## 2019-08-11 NOTE — ED Notes (Addendum)
[facility] called for patient transport to [facility] 3W. Attempted to call report to [facility] 3W, but was told the RN was unavailable as she was taking report on another patient. Will re-attempt

## 2019-08-11 NOTE — Progress Notes (Signed)
Inpatient Diabetes Program Recommendations  AACE/ADA: New Consensus Statement on Inpatient Glycemic Control (2015)  Target Ranges:  Prepandial:   less than 140 mg/dL      Peak postprandial:   less than 180 mg/dL (1-2 hours)      Critically ill patients:  140 - 180 mg/dL   Lab Results  Component Value Date   [id number] (H) 08/11/2019    Review of Glycemic Control  Diabetes history: DM2 Outpatient Diabetes medications: metformin 500 mg bid Current orders for Inpatient glycemic control: Novolog 0-9 units tidwc.  Blood sugars 434, 440 mg/dL. No HgbA1C available.   Inpatient Diabetes Program Recommendations:     Add Lantus 10 units QD HgbA1C to assess glycemic control prior to admission  Will continue to follow.   Thank you. Lorenda Peck, RD, LDN, CDE Inpatient Diabetes Coordinator [phone]

## 2019-08-11 NOTE — ED Notes (Signed)
US at bedside

## 2019-08-11 NOTE — ED Notes (Signed)
Attempted to call report to [facility] 3W RN x2. RN not available at this time- reports she is still getting report on another patient. Will re-attempt.

## 2019-08-12 ENCOUNTER — Inpatient Hospital Stay (HOSPITAL_COMMUNITY): Payer: BLUE CROSS/BLUE SHIELD

## 2019-08-12 DIAGNOSIS — I340 Nonrheumatic mitral (valve) insufficiency: Secondary | ICD-10-CM

## 2019-08-12 DIAGNOSIS — I639 Cerebral infarction, unspecified: Secondary | ICD-10-CM

## 2019-08-12 DIAGNOSIS — I10 Essential (primary) hypertension: Secondary | ICD-10-CM | POA: Diagnosis not present

## 2019-08-12 DIAGNOSIS — I361 Nonrheumatic tricuspid (valve) insufficiency: Secondary | ICD-10-CM

## 2019-08-12 LAB — FOLATE: Folate: 6.6 ng/mL (ref >5.9)

## 2019-08-12 LAB — GLUCOSE, CAPILLARY
Glucose-Capillary: 315 mg/dL — ABNORMAL HIGH (ref 70–99)
Glucose-Capillary: 116 mg/dL — ABNORMAL HIGH (ref 70–99)
Glucose-Capillary: 124 mg/dL — ABNORMAL HIGH (ref 70–99)
Glucose-Capillary: 121 mg/dL — ABNORMAL HIGH (ref 70–99)

## 2019-08-12 LAB — ECHOCARDIOGRAM COMPLETE: Weight: 2176.38 oz

## 2019-08-12 LAB — VITAMIN B12: Vitamin B-12: 326 pg/mL (ref 180–914)

## 2019-08-12 LAB — D-DIMER, QUANTITATIVE: D-Dimer, Quant: 1.7 ug/mL-FEU — ABNORMAL HIGH (ref 0.00–0.50)

## 2019-08-12 LAB — FIBRINOGEN: Fibrinogen: >800 mg/dL — ABNORMAL HIGH (ref 210–475)

## 2019-08-12 LAB — ANTITHROMBIN III: AntiThromb III Func: 106 % (ref 75–120)

## 2019-08-12 LAB — RPR: RPR Ser Ql: NONREACTIVE

## 2019-08-12 LAB — HEMOGLOBIN A1C
Hgb A1c MFr Bld: 11.6 % — ABNORMAL HIGH (ref 4.8–5.6)
Mean Plasma Glucose: 286 mg/dL

## 2019-08-12 MED ORDER — INSULIN ASPART 100 UNIT/ML ~~LOC~~ SOLN: 0.0000 [IU] | Freq: Every day | SUBCUTANEOUS | Status: DC

## 2019-08-12 MED ORDER — CLOPIDOGREL BISULFATE 75 MG PO TABS
75.0000 mg | ORAL_TABLET | Freq: Every day | ORAL | Status: DC
  Administered 2019-08-12 – 2019-08-14 (×3): 75 mg via ORAL
  Filled 2019-08-12 (×3): qty 1

## 2019-08-12 MED ORDER — INSULIN GLARGINE 100 UNIT/ML ~~LOC~~ SOLN
12.0000 [IU] | SUBCUTANEOUS | Status: DC
  Administered 2019-08-12 – 2019-08-13 (×2): 12 [IU] via SUBCUTANEOUS
  Filled 2019-08-12 (×4): qty 0.12

## 2019-08-12 MED ORDER — AMLODIPINE BESYLATE 2.5 MG PO TABS
2.5000 mg | ORAL_TABLET | Freq: Every day | ORAL | Status: DC
  Administered 2019-08-12: 2.5 mg via ORAL
  Filled 2019-08-12: qty 1

## 2019-08-12 MED ORDER — SODIUM CHLORIDE 0.9 % IV SOLN: INTRAVENOUS | Status: AC

## 2019-08-12 MED ORDER — INSULIN ASPART 100 UNIT/ML ~~LOC~~ SOLN
4.0000 [IU] | Freq: Three times a day (TID) | SUBCUTANEOUS | Status: DC
  Administered 2019-08-12 – 2019-08-14 (×5): 4 [IU] via SUBCUTANEOUS

## 2019-08-12 MED ORDER — ATORVASTATIN CALCIUM 80 MG PO TABS
80.0000 mg | ORAL_TABLET | Freq: Every day | ORAL | Status: DC
  Administered 2019-08-12 – 2019-08-13 (×2): 80 mg via ORAL
  Filled 2019-08-12 (×3): qty 1

## 2019-08-12 MED ORDER — INSULIN ASPART 100 UNIT/ML ~~LOC~~ SOLN
0.0000 [IU] | Freq: Three times a day (TID) | SUBCUTANEOUS | Status: DC
  Administered 2019-08-12: 18:00:00 1 [IU] via SUBCUTANEOUS
  Administered 2019-08-13: 2 [IU] via SUBCUTANEOUS
  Administered 2019-08-13: 18:00:00 5 [IU] via SUBCUTANEOUS
  Administered 2019-08-14: 06:00:00 1 [IU] via SUBCUTANEOUS

## 2019-08-12 NOTE — Progress Notes (Signed)
Paged on call about patient's bp of 225/79 (118); received care order to recheck bp at 9pm and if it is greater than [id number] systolic call for medication order; will continue to monitor.

## 2019-08-12 NOTE — Progress Notes (Signed)
  Echocardiogram 2D Echocardiogram has been performed.  Phillip Camacho 08/12/2019, 4:12 PM

## 2019-08-12 NOTE — H&P (View-Only) (Signed)
Stroke Neurology Consultation Note  Consult Requested by: [facility], seen by teleneurology at [facility] ED 08/11/2019  Reason for Consult: stroke symptoms  Consult Date:  08/12/19  The history was obtained from the pt, his son and medical record.  During history and examination, all items were  able to obtain unless otherwise noted.  History of Present Illness:  Phillip Camacho is an 48 y.o. [place] male with PMH of HTN, DM, and tobacco abusepresented to [facility] with confusion, slurred speech, L facial droop, and L side weakness. teleneurology called for code stroke. NIHSS = 5 (question, left facial droop, dysarthria, b/l LE drift). CT showed subacute right cerebellar infarct.  Glucose was high.  Admitted for further stroke work-up.  Overnight, patient symptom gradually getting better and now resolved.  Stroke work-up with MRI showed right cerebellum, bilateral punctate MCA/PCA, and right PCA and right thalamus infarcts.  As per son, patient not compliant with medication at home, not taking BP and glucose level.  Found to have elevated creatinine, with plans to see nephrology soon.  Current smoker, denies alcohol or illicit drugs.   Past Medical History:  Diagnosis Date  . Diabetes mellitus without complication ([place])      History reviewed. No pertinent surgical history.  History reviewed. No pertinent family history.   Social History:  reports that he has been smoking. He has never used smokeless tobacco. He reports that he does not drink alcohol. No history on file for drug.  Review of Systems: A full ROS was attempted today and was able to be performed.  Systems assessed include - Constitutional, Eyes, HENT, Respiratory, Cardiovascular, Gastrointestinal, Genitourinary, Integument/breast, Hematologic/lymphatic, Musculoskeletal, Neurological, Behavioral/Psych, Endocrine, Allergic/Immunologic - with pertinent responses as per HPI.  Allergies: No Known  Allergies   Medications:  Prior to Admission:  Medications Prior to Admission  Medication Sig Dispense Refill Last Dose  . furosemide (LASIX) 80 MG tablet Take 1 tablet by mouth daily as needed for fluid.   Unknown  . ibuprofen (ADVIL,MOTRIN) 600 MG tablet Take 1 tablet (600 mg total) by mouth every 8 (eight) hours as needed. (Patient not taking: Reported on 08/11/2019) 15 tablet 0 Not Taking at Unknown time  . metFORMIN (GLUCOPHAGE) 500 MG tablet Take 1 tablet (500 mg total) by mouth 2 (two) times daily with a meal. 60 tablet 11    Scheduled: . aspirin  300 mg Rectal Daily   Or  . aspirin  325 mg Oral Daily  . atorvastatin  80 mg Oral q1800  . clopidogrel  75 mg Oral Daily  . heparin  5,000 Units Subcutaneous Q8H  . insulin aspart  0-5 Units Subcutaneous QHS  . insulin aspart  0-9 Units Subcutaneous TID WC  . insulin aspart  4 Units Subcutaneous TID WC  . insulin glargine  12 Units Subcutaneous Q24H  . nicotine  21 mg Transdermal Daily   Continuous: . sodium chloride 50 mL/hr at 08/12/19 [id number]    Test Results: CBC:  Recent Labs  Lab 08/11/19 1048 08/11/19 1054  WBC 11.3*  --   NEUTROABS 8.1*  --   HGB 13.4 14.6  HCT 41.7 43.0  MCV 87.1  --   PLT 494*  --    Basic Metabolic Panel:  Recent Labs  Lab 08/11/19 1048 08/11/19 1054  NA 130* 133*  K 3.2* 3.3*  CL 97* 96*  CO2 27  --   GLUCOSE 446* 440*  BUN 22* 22*  CREATININE 2.72* 2.60*  CALCIUM 8.1*  --    Liver Function Tests: Recent Labs  Lab 08/11/19 1048  AST 7*  ALT 10  ALKPHOS 112  BILITOT 0.3  PROT 5.7*  ALBUMIN 1.8*   No results for input(s): LIPASE, AMYLASE in the last 168 hours. No results for input(s): AMMONIA in the last 168 hours. Coagulation Studies:  Recent Labs    08/11/19 1048  LABPROT 11.4  INR 0.8   Cardiac Enzymes: No results for input(s): CKTOTAL, CKMB, CKMBINDEX, TROPONINI in the last 168 hours. BNP: Invalid input(s): POCBNP CBG:  Recent Labs  Lab 08/11/19 1041  08/11/19 1711 08/11/19 2119 08/12/19 0919 08/12/19 1146  GLUCAP 434* 297* 226* 315* 116*   Urinalysis:  Recent Labs  Lab 08/11/19 1254  COLORURINE STRAW*  LABSPEC 1.011  PHURINE 7.0  GLUCOSEU >=500*  HGBUR SMALL*  BILIRUBINUR NEGATIVE  KETONESUR NEGATIVE  PROTEINUR >=300*  NITRITE NEGATIVE  LEUKOCYTESUR NEGATIVE   Microbiology:  Results for orders placed or performed during the hospital encounter of 08/11/19  SARS CORONAVIRUS 2 (TAT 6-24 HRS) Nasopharyngeal Nasopharyngeal Swab     Status: None   Collection Time: 08/11/19 11:16 AM   Specimen: Nasopharyngeal Swab  Result Value Ref Range Status   SARS Coronavirus 2 NEGATIVE NEGATIVE Final    Comment: (NOTE) SARS-CoV-2 target nucleic acids are NOT DETECTED. The SARS-CoV-2 RNA is generally detectable in upper and lower respiratory specimens during the acute phase of infection. Negative results do not preclude SARS-CoV-2 infection, do not rule out co-infections with other pathogens, and should not be used as the sole basis for treatment or other patient management decisions. Negative results must be combined with clinical observations, patient history, and epidemiological information. The expected result is Negative. Fact Sheet for Patients: SugarRoll.be Fact Sheet for Healthcare Providers: https://www.woods-mathews.com/ This test is not yet approved or cleared by the [place] FDA and  has been authorized for detection and/or diagnosis of SARS-CoV-2 by FDA under an Emergency Use Authorization (EUA). This EUA will remain  in effect (meaning this test can be used) for the duration of the COVID-19 declaration under Section 56 4(b)(1) of the Act, 21 U.S.C. section 360bbb-3(b)(1), unless the authorization is terminated or revoked sooner. Performed at [facility], [place] [address], [place], [place] [address]   Culture, blood (routine x 2)     Status: None (Preliminary result)    Collection Time: 08/11/19  5:45 PM   Specimen: BLOOD  Result Value Ref Range Status   Specimen Description   Final    BLOOD RIGHT ANTECUBITAL Performed at [facility] [address], [place], [place] [address]    Special Requests   Final    BOTTLES DRAWN AEROBIC AND ANAEROBIC Blood Culture adequate volume Performed at [facility] [address], [place], [place] [address]    Culture   Final    NO GROWTH < 12 HOURS Performed at [facility] [address], [place], [place] [address]    Report Status PENDING  Incomplete   Lipid Panel: No results found for: CHOL, TRIG, HDL, CHOLHDL, VLDL, LDLCALC HgbA1c:  Lab Results  Component Value Date   HGBA1C 11.6 (H) 08/11/2019   Urine Drug Screen:     Component Value Date/Time   LABOPIA NONE DETECTED 08/11/2019 1254   COCAINSCRNUR NONE DETECTED 08/11/2019 1254   LABBENZ NONE DETECTED 08/11/2019 1254   AMPHETMU NONE DETECTED 08/11/2019 1254   THCU NONE DETECTED 08/11/2019 1254   LABBARB NONE DETECTED 08/11/2019  1254    Alcohol Level:  Recent Labs  Lab 08/11/19 1048  ETH <10    Ct Head Wo Contrast  Result Date: 08/11/2019 CLINICAL DATA:  Left-sided facial droop with slurred speech. Left-sided weakness EXAM: CT HEAD WITHOUT CONTRAST TECHNIQUE: Contiguous axial images were obtained from the base of the skull through the vertex without intravenous contrast. COMPARISON:  None. FINDINGS: Brain: Ventricles are normal in size and configuration. There is a small cavum septum pellucidum, an anatomic variant. There is no intracranial mass, hemorrhage, extra-axial fluid collection, or midline shift. There is a focal infarct in the posterior aspect of the dentate nucleus on the right medially which may be recent. No other focal infarct evident. Vascular: No hyperdense vessel evident. There is calcification in each carotid siphon region. Skull: The bony calvarium appears intact. Sinuses/Orbits: There  is mucosal thickening in several ethmoid air cells. Other visualized paranasal sinuses are clear. Orbits appear symmetric bilaterally. There is rightward deviation of the nasal septum. Other: Visualized mastoid air cells are clear. IMPRESSION: Age uncertain and potentially recent infarct in the posteromedial aspect of the dentate nucleus of the right cerebellum. No other parenchymal abnormality to suggest infarct noted. No mass or hemorrhage. There are foci of arterial vascular calcification. There is mucosal thickening in several ethmoid air cells. There is rightward deviation of the nasal septum. Electronically Signed   By: Lowella Grip III M.D.   On: 08/11/2019 11:08   Mr Angio Head Wo Contrast  Result Date: 08/12/2019 CLINICAL DATA:  Stroke follow-up EXAM: MRA HEAD WITHOUT CONTRAST TECHNIQUE: Angiographic images of the Circle of Willis were obtained using MRA technique without intravenous contrast. COMPARISON:  Brain MRI from yesterday FINDINGS: Anterior circulation: Atheromatous irregularity of cavernous carotids with up to 50% stenosis at the right paraclinoid segment. Degree of right ACA hypoplasia with at least moderate narrowed appearance of right A1 and A2 segments. Mild atheromatous irregularity of medium size vessels. Negative for aneurysm. Posterior circulation: The vertebral and basilar arteries are smooth and widely patent. Dominant left PICA and right AICA. Moderate stenosis at the right P2 segment and high-grade stenosis with apparent flow gap at the upper branch right PCA. IMPRESSION: Age advanced intracranial atherosclerosis with affected sites described above. Electronically Signed   By: Monte Fantasia M.D.   On: 08/12/2019 04:56   Mr Angio Neck Wo Contrast  Result Date: 08/12/2019 CLINICAL DATA:  Stroke follow-up EXAM: MRA NECK WITHOUT CONTRAST TECHNIQUE: Angiographic images of the neck were obtained using MRA technique without intravenous contrast. COMPARISON:  None. FINDINGS:  The arch was not covered in the field of view. There appears to be 3 vessel branching with no evidence of great vessel stenoses. There is antegrade flow in the bilateral carotid and vertebral circulation. No significant stenosis, beading, or ulceration. There is mild narrowing at the proximal right ECA. IMPRESSION: Essentially negative neck MRA. Electronically Signed   By: Monte Fantasia M.D.   On: 08/12/2019 04:58   Mr Brain Wo Contrast  Result Date: 08/11/2019 CLINICAL DATA:  Left-sided weakness, facial droop, and slurred speech. History of hypertension and diabetes. EXAM: MRI HEAD WITHOUT CONTRAST TECHNIQUE: Multiplanar, multiecho pulse sequences of the brain and surrounding structures were obtained without intravenous contrast. COMPARISON:  Head CT 08/11/2019 FINDINGS: Brain: There is a 1 cm acute infarct medially in the right cerebellar hemisphere. Additionally, there are scattered punctate acute infarcts involving the left greater than right cerebral hemispheres, predominantly white matter as well as left thalamus. Scattered small foci of T2 hyperintensity  elsewhere in the cerebral white matter bilaterally and brainstem/right cerebral peduncle are nonspecific though may reflect mild chronic small vessel ischemic disease given vascular risk factors. Chronic lacunar infarcts are present in the dorsal thalami bilaterally. The ventricles and sulci are within normal limits in size for age. A cavum septum pellucidum is incidentally noted. No intracranial hemorrhage, mass, midline shift, or extra-axial fluid collection is identified. Vascular: Major intracranial vascular flow voids are preserved. Skull and upper cervical spine: Unremarkable bone marrow signal. Sinuses/Orbits: Unremarkable orbits. Trace left mastoid fluid. Mild left ethmoid air cell mucosal thickening. Other: 2 cm lipoma in the scalp posterior to the right ear. IMPRESSION: 1. Small acute infarcts in the right cerebellum and both cerebral  hemispheres. 2. Mild chronic small vessel ischemic disease. Electronically Signed   By: Logan Bores M.D.   On: 08/11/2019 15:58   US Renal  Result Date: 08/11/2019 CLINICAL DATA:  Acute kidney injury EXAM: RENAL / URINARY TRACT ULTRASOUND COMPLETE COMPARISON:  None. FINDINGS: Right Kidney: Renal measurements: 10.7 x 5.6 x 6.5 cm = volume: 202.2 mL . Echogenicity is increased. Renal cortical thickness is normal. No mass, perinephric fluid, or hydronephrosis visualized. No sonographically demonstrable calculus or ureterectasis. Left Kidney: Renal measurements: 11.3 x 7.4 x 4.4 cm = volume: 192.7 mL. Echogenicity is increased. Renal cortical thickness is normal. No mass, perinephric fluid, or hydronephrosis visualized. No sonographically demonstrable calculus or ureterectasis. Bladder: Appears normal for degree of bladder distention. Other: None IMPRESSION: Increased renal echogenicity, a finding indicative of medical renal disease. Renal cortical thickness normal. No obstructing focus on either side. Study otherwise unremarkable. Electronically Signed   By: Lowella Grip III M.D.   On: 08/11/2019 14:36   Dg Chest Port 1 View  Result Date: 08/11/2019 CLINICAL DATA:  Stroke. EXAM: PORTABLE CHEST 1 VIEW COMPARISON:  None. FINDINGS: The heart size and mediastinal contours are within normal limits. Normal pulmonary vascularity. Small to moderate right and small left pleural effusions with adjacent bibasilar atelectasis. No pneumothorax. No acute osseous abnormality. IMPRESSION: 1. Small to moderate right and small left pleural effusions with adjacent bibasilar atelectasis. Electronically Signed   By: Titus Dubin M.D.   On: 08/11/2019 15:10   Vas [place] Lower Extremity Venous (dvt)  Result Date: 08/12/2019  Lower Venous Study Indications: Stroke.  Performing Technologist: Antonieta Pert RDMS, RVT  Examination Guidelines: A complete evaluation includes B-mode imaging, spectral Doppler, color Doppler, and  power Doppler as needed of all accessible portions of each vessel. Bilateral testing is considered an integral part of a complete examination. Limited examinations for reoccurring indications may be performed as noted.  +---------+---------------+---------+-----------+----------+--------------+ RIGHT    CompressibilityPhasicitySpontaneityPropertiesThrombus Aging +---------+---------------+---------+-----------+----------+--------------+ CFV      Full           Yes      Yes                                 +---------+---------------+---------+-----------+----------+--------------+ SFJ      Full                                                        +---------+---------------+---------+-----------+----------+--------------+ FV Prox  Full                                                        +---------+---------------+---------+-----------+----------+--------------+  FV Mid   Full                                                        +---------+---------------+---------+-----------+----------+--------------+ FV DistalFull                                                        +---------+---------------+---------+-----------+----------+--------------+ PFV      Full                                                        +---------+---------------+---------+-----------+----------+--------------+ POP      Full           Yes      Yes                                 +---------+---------------+---------+-----------+----------+--------------+ PTV      Full                                                        +---------+---------------+---------+-----------+----------+--------------+ PERO     Full                                                        +---------+---------------+---------+-----------+----------+--------------+ GSV      Full                                                         +---------+---------------+---------+-----------+----------+--------------+ atherosclerotic changes throughout extremity.  +---------+---------------+---------+-----------+----------+--------------+ LEFT     CompressibilityPhasicitySpontaneityPropertiesThrombus Aging +---------+---------------+---------+-----------+----------+--------------+ CFV      Full           Yes      Yes                                 +---------+---------------+---------+-----------+----------+--------------+ SFJ      Full                                                        +---------+---------------+---------+-----------+----------+--------------+ FV Prox  Full                                                        +---------+---------------+---------+-----------+----------+--------------+  FV Mid   Full                                                        +---------+---------------+---------+-----------+----------+--------------+ FV DistalFull                                                        +---------+---------------+---------+-----------+----------+--------------+ PFV      Full                                                        +---------+---------------+---------+-----------+----------+--------------+ POP      Full           Yes      Yes                                 +---------+---------------+---------+-----------+----------+--------------+ PTV      Full                                                        +---------+---------------+---------+-----------+----------+--------------+ PERO     Full                                                        +---------+---------------+---------+-----------+----------+--------------+ GSV      Full                                                        +---------+---------------+---------+-----------+----------+--------------+ atherosclerotic changes throughout extremity.    Summary: Right: There is no  evidence of deep vein thrombosis in the lower extremity. No cystic structure found in the popliteal fossa. Left: There is no evidence of deep vein thrombosis in the lower extremity. No cystic structure found in the popliteal fossa.  *See table(s) above for measurements and observations.    Preliminary      EKG: normal sinus rhythm, probable LA enlargement, probable LVH, baseline wander in V5.   Physical Examination: Temp:  [97.6 F (36.4 C)-98.7 F (37.1 C)] 97.6 F (36.4 C) (10/20 1143) Pulse Rate:  [78-100] 100 (10/20 1143) Resp:  [14-23] 17 (10/20 1143) BP: (146-206)/(72-92) 171/86 (10/20 1143) SpO2:  [96 %-100 %] 100 % (10/20 1143)  General - Well nourished, well developed, in no apparent distress.  Ophthalmologic - fundi not visualized due to noncooperation.  Cardiovascular - Regular rate and rhythm.  Mental Status -  Level of arousal and orientation to time, place, and person were intact. Language including  expression, naming, repetition, comprehension was assessed and found intact.  Cranial Nerves II - XII - II - Visual field intact OU. III, IV, VI - Extraocular movements intact. V - Facial sensation intact bilaterally. VII - Facial movement intact bilaterally. VIII - Hearing & vestibular intact bilaterally. X - Palate elevates symmetrically. XI - Chin turning & shoulder shrug intact bilaterally. XII - Tongue protrusion intact.  Motor Strength - The patient's strength was normal in all extremities and pronator drift was absent.  Bulk was normal and fasciculations were absent.   Motor Tone - Muscle tone was assessed at the neck and appendages and was normal.  Reflexes - The patient's reflexes were symmetrical in all extremities and he had no pathological reflexes.  Sensory - Light touch, temperature/pinprick were assessed and were symmetrical.    Coordination - The patient had normal movements in the hands with no ataxia or dysmetria.  Tremor was absent.  Gait and  Station - deferred.    Data Reviewed: Ct Head Wo Contrast  Result Date: 08/11/2019 CLINICAL DATA:  Left-sided facial droop with slurred speech. Left-sided weakness EXAM: CT HEAD WITHOUT CONTRAST TECHNIQUE: Contiguous axial images were obtained from the base of the skull through the vertex without intravenous contrast. COMPARISON:  None. FINDINGS: Brain: Ventricles are normal in size and configuration. There is a small cavum septum pellucidum, an anatomic variant. There is no intracranial mass, hemorrhage, extra-axial fluid collection, or midline shift. There is a focal infarct in the posterior aspect of the dentate nucleus on the right medially which may be recent. No other focal infarct evident. Vascular: No hyperdense vessel evident. There is calcification in each carotid siphon region. Skull: The bony calvarium appears intact. Sinuses/Orbits: There is mucosal thickening in several ethmoid air cells. Other visualized paranasal sinuses are clear. Orbits appear symmetric bilaterally. There is rightward deviation of the nasal septum. Other: Visualized mastoid air cells are clear. IMPRESSION: Age uncertain and potentially recent infarct in the posteromedial aspect of the dentate nucleus of the right cerebellum. No other parenchymal abnormality to suggest infarct noted. No mass or hemorrhage. There are foci of arterial vascular calcification. There is mucosal thickening in several ethmoid air cells. There is rightward deviation of the nasal septum. Electronically Signed   By: Lowella Grip III M.D.   On: 08/11/2019 11:08   Mr Angio Head Wo Contrast  Result Date: 08/12/2019 CLINICAL DATA:  Stroke follow-up EXAM: MRA HEAD WITHOUT CONTRAST TECHNIQUE: Angiographic images of the Circle of Willis were obtained using MRA technique without intravenous contrast. COMPARISON:  Brain MRI from yesterday FINDINGS: Anterior circulation: Atheromatous irregularity of cavernous carotids with up to 50% stenosis at the  right paraclinoid segment. Degree of right ACA hypoplasia with at least moderate narrowed appearance of right A1 and A2 segments. Mild atheromatous irregularity of medium size vessels. Negative for aneurysm. Posterior circulation: The vertebral and basilar arteries are smooth and widely patent. Dominant left PICA and right AICA. Moderate stenosis at the right P2 segment and high-grade stenosis with apparent flow gap at the upper branch right PCA. IMPRESSION: Age advanced intracranial atherosclerosis with affected sites described above. Electronically Signed   By: Monte Fantasia M.D.   On: 08/12/2019 04:56   Mr Angio Neck Wo Contrast  Result Date: 08/12/2019 CLINICAL DATA:  Stroke follow-up EXAM: MRA NECK WITHOUT CONTRAST TECHNIQUE: Angiographic images of the neck were obtained using MRA technique without intravenous contrast. COMPARISON:  None. FINDINGS: The arch was not covered in the field of view. There appears to  be 3 vessel branching with no evidence of great vessel stenoses. There is antegrade flow in the bilateral carotid and vertebral circulation. No significant stenosis, beading, or ulceration. There is mild narrowing at the proximal right ECA. IMPRESSION: Essentially negative neck MRA. Electronically Signed   By: Monte Fantasia M.D.   On: 08/12/2019 04:58   Mr Brain Wo Contrast  Result Date: 08/11/2019 CLINICAL DATA:  Left-sided weakness, facial droop, and slurred speech. History of hypertension and diabetes. EXAM: MRI HEAD WITHOUT CONTRAST TECHNIQUE: Multiplanar, multiecho pulse sequences of the brain and surrounding structures were obtained without intravenous contrast. COMPARISON:  Head CT 08/11/2019 FINDINGS: Brain: There is a 1 cm acute infarct medially in the right cerebellar hemisphere. Additionally, there are scattered punctate acute infarcts involving the left greater than right cerebral hemispheres, predominantly white matter as well as left thalamus. Scattered small foci of T2  hyperintensity elsewhere in the cerebral white matter bilaterally and brainstem/right cerebral peduncle are nonspecific though may reflect mild chronic small vessel ischemic disease given vascular risk factors. Chronic lacunar infarcts are present in the dorsal thalami bilaterally. The ventricles and sulci are within normal limits in size for age. A cavum septum pellucidum is incidentally noted. No intracranial hemorrhage, mass, midline shift, or extra-axial fluid collection is identified. Vascular: Major intracranial vascular flow voids are preserved. Skull and upper cervical spine: Unremarkable bone marrow signal. Sinuses/Orbits: Unremarkable orbits. Trace left mastoid fluid. Mild left ethmoid air cell mucosal thickening. Other: 2 cm lipoma in the scalp posterior to the right ear. IMPRESSION: 1. Small acute infarcts in the right cerebellum and both cerebral hemispheres. 2. Mild chronic small vessel ischemic disease. Electronically Signed   By: Logan Bores M.D.   On: 08/11/2019 15:58   US Renal  Result Date: 08/11/2019 CLINICAL DATA:  Acute kidney injury EXAM: RENAL / URINARY TRACT ULTRASOUND COMPLETE COMPARISON:  None. FINDINGS: Right Kidney: Renal measurements: 10.7 x 5.6 x 6.5 cm = volume: 202.2 mL . Echogenicity is increased. Renal cortical thickness is normal. No mass, perinephric fluid, or hydronephrosis visualized. No sonographically demonstrable calculus or ureterectasis. Left Kidney: Renal measurements: 11.3 x 7.4 x 4.4 cm = volume: 192.7 mL. Echogenicity is increased. Renal cortical thickness is normal. No mass, perinephric fluid, or hydronephrosis visualized. No sonographically demonstrable calculus or ureterectasis. Bladder: Appears normal for degree of bladder distention. Other: None IMPRESSION: Increased renal echogenicity, a finding indicative of medical renal disease. Renal cortical thickness normal. No obstructing focus on either side. Study otherwise unremarkable. Electronically Signed   By:  Lowella Grip III M.D.   On: 08/11/2019 14:36   Dg Chest Port 1 View  Result Date: 08/11/2019 CLINICAL DATA:  Stroke. EXAM: PORTABLE CHEST 1 VIEW COMPARISON:  None. FINDINGS: The heart size and mediastinal contours are within normal limits. Normal pulmonary vascularity. Small to moderate right and small left pleural effusions with adjacent bibasilar atelectasis. No pneumothorax. No acute osseous abnormality. IMPRESSION: 1. Small to moderate right and small left pleural effusions with adjacent bibasilar atelectasis. Electronically Signed   By: Titus Dubin M.D.   On: 08/11/2019 15:10   Vas [place] Lower Extremity Venous (dvt)  Result Date: 08/12/2019  Lower Venous Study Indications: Stroke.  Performing Technologist: Antonieta Pert RDMS, RVT  Examination Guidelines: A complete evaluation includes B-mode imaging, spectral Doppler, color Doppler, and power Doppler as needed of all accessible portions of each vessel. Bilateral testing is considered an integral part of a complete examination. Limited examinations for reoccurring indications may be performed as noted.  +---------+---------------+---------+-----------+----------+--------------+  RIGHT    CompressibilityPhasicitySpontaneityPropertiesThrombus Aging +---------+---------------+---------+-----------+----------+--------------+ CFV      Full           Yes      Yes                                 +---------+---------------+---------+-----------+----------+--------------+ SFJ      Full                                                        +---------+---------------+---------+-----------+----------+--------------+ FV Prox  Full                                                        +---------+---------------+---------+-----------+----------+--------------+ FV Mid   Full                                                        +---------+---------------+---------+-----------+----------+--------------+ FV DistalFull                                                         +---------+---------------+---------+-----------+----------+--------------+ PFV      Full                                                        +---------+---------------+---------+-----------+----------+--------------+ POP      Full           Yes      Yes                                 +---------+---------------+---------+-----------+----------+--------------+ PTV      Full                                                        +---------+---------------+---------+-----------+----------+--------------+ PERO     Full                                                        +---------+---------------+---------+-----------+----------+--------------+ GSV      Full                                                        +---------+---------------+---------+-----------+----------+--------------+  atherosclerotic changes throughout extremity.  +---------+---------------+---------+-----------+----------+--------------+ LEFT     CompressibilityPhasicitySpontaneityPropertiesThrombus Aging +---------+---------------+---------+-----------+----------+--------------+ CFV      Full           Yes      Yes                                 +---------+---------------+---------+-----------+----------+--------------+ SFJ      Full                                                        +---------+---------------+---------+-----------+----------+--------------+ FV Prox  Full                                                        +---------+---------------+---------+-----------+----------+--------------+ FV Mid   Full                                                        +---------+---------------+---------+-----------+----------+--------------+ FV DistalFull                                                        +---------+---------------+---------+-----------+----------+--------------+ PFV      Full                                                         +---------+---------------+---------+-----------+----------+--------------+ POP      Full           Yes      Yes                                 +---------+---------------+---------+-----------+----------+--------------+ PTV      Full                                                        +---------+---------------+---------+-----------+----------+--------------+ PERO     Full                                                        +---------+---------------+---------+-----------+----------+--------------+ GSV      Full                                                        +---------+---------------+---------+-----------+----------+--------------+  atherosclerotic changes throughout extremity.    Summary: Right: There is no evidence of deep vein thrombosis in the lower extremity. No cystic structure found in the popliteal fossa. Left: There is no evidence of deep vein thrombosis in the lower extremity. No cystic structure found in the popliteal fossa.  *See table(s) above for measurements and observations.    Preliminary      Assessment:  Mr. IAGO COTTINGHAM is a 48 y.o. male with history of HTN, DB, tobacco abuse presenting to [facility] ED with confusion, slurred speech, drooling, L facial droop, L side weakness with L sided CP. He did not receive IV t-PA due as out of the window.   Stroke: R small cerebellar and bilateral MCA/PCA, right PCA, right thalamic punctate infarcts embolic secondary to unknown source  CT head age indeterminate R cerebellar infarct. Sinus dz. R septum deviation  MRI  Small R cerebellar and B cerebral punctate infarcts. Mild small vessel disease.   MRA head age advanced atherosclerosis, right ICA siphon 50% stenosis, right P2 moderate and P3 high-grade stenosis.  MRA neck Unremarkable   LE Doppler  No DVT  2D Echo EF 65 to 70%  TEE tomorrow at 0930  If TEE negative, a [facility] electrophysiologist will consult and consider placement of an implantable loop recorder to evaluate for atrial fibrillation as etiology of stroke. This has been explained to patient/family by Dr. Erlinda Hong and he is agreeable. They plan to see tomorrow.  LDL pending   HgbA1c 11.6   Heparin 5000 units sq tid for VTE prophylaxis  No antithrombotic prior to admission, now on aspirin 325 daily and clopidogrel 75 mg daily.  Continue DAPT for 3 months and then aspirin alone given severe intracranial stenosis.  Therapy recommendations:  HH PT, no OT  Disposition:  Return home  Hypertensive Urgency   BP as high as 228/95  Stable on the high end  On amlodipine 2.5 . Permissive hypertension (OK if < 220/120) but gradually normalize in 3-5 days . Long-term BP goal 130-150 given severe intracranial stenosis  Hyperlipidemia  Home meds:  No statin  Now on lipitor 80  LDL pending, goal < 70  Continue statin at discharge  Diabetes type II, Uncontrolled  HgbA1c 11.6, goal < 7.0  CBGs  SSI  On Lantus  DB coordinator following  Close PCP follow-up for better DM control  CKD  Creatinine 2.72->2.6  Has outpatient follow-up with nephrology  Tobacco abuse  Current smoker  Smoking cessation counseling provided  Nicotine patch provided  Pt is willing to quit  Other Stroke Risk Factors    Other Active Problems  Leukocytosis WBC 11.3  Hypokalemia K 3.3-supplement  Hospital day # 1   Thank you for this consultation and allowing [place] to participate in the care of this patient.  We will follow  Rosalin Hawking, MD PhD Stroke Neurology 08/12/2019 6:57 PM  To contact Stroke Continuity provider, please refer to [email or web address] After hours, contact General Neurology

## 2019-08-12 NOTE — Evaluation (Signed)
Physical Therapy Evaluation Patient Details Name: Phillip Camacho MRN: [id number] DOB: Jan 22, 1971 Today's Date: 08/12/2019   History of Present Illness  Phillip Camacho is a 48 y.o. male with medical history significant for hypertension, diabetes, tobacco abuse, presents to the ED after son found patient to be very confused, slurred speech, drooling, left-sided facial droop, left-sided weakness, with some left-sided chest pain this a.m.   Clinical Impression  Pt understands most english and demonstrated good command following however when speaking in medical terms and providing test/imaging results he needs it to be translated in [place]. Pt doesn't read english very well. Pts son came at end of session and verified pt's report of PLOF and living set up to be accurate. Pt with mild L UE/LE weakness, mild delayed processing (could be language barrier), and mildly impaired balance. Pt's son reports daughter to be arriving from [place] tonight or tomorrow morning and to stay with patient for 2 weeks as they are looking for an ALF for patient as his lease is up this month. Son very supportive and hands on. Pt functioning at min guard for safety. Acute PT to cont to follow.     Follow Up Recommendations Home health PT;Supervision/Assistance - 24 hour    Equipment Recommendations  None recommended by PT    Recommendations for Other Services       Precautions / Restrictions Precautions Precautions: Fall Precaution Comments: limited english Restrictions Weight Bearing Restrictions: No      Mobility  Bed Mobility Overal bed mobility: Modified Independent             General bed mobility comments: no difficulty, HOB elevated  Transfers Overall transfer level: Needs assistance Equipment used: None Transfers: Sit to/from Stand Sit to Stand: Min guard         General transfer comment: min guard for safety due to first time up in 24 hours, mildly unsteady initially but no physical  assist needed  Ambulation/Gait Ambulation/Gait assistance: Min assist Gait Distance (Feet): 300 Feet Assistive device: None Gait Pattern/deviations: Step-through pattern;Decreased stride length Gait velocity: slow Gait velocity interpretation: 1.31 - 2.62 ft/sec, indicative of limited community ambulator General Gait Details: pt inititaly unsteady however became steady and amb WFL  Stairs Stairs: Yes Stairs assistance: Min assist Stair Management: One rail Right Number of Stairs: 3(limited by IV pole and lines) General stair comments: no difficulty, reciprocal  Wheelchair Mobility    Modified Rankin (Stroke Patients Only) Modified Rankin (Stroke Patients Only) Pre-Morbid Rankin Score: No significant disability Modified Rankin: Slight disability     Balance Overall balance assessment: Mild deficits observed, not formally tested                               Standardized Balance Assessment Standardized Balance Assessment : Dynamic Gait Index   Dynamic Gait Index Level Surface: Mild Impairment Change in Gait Speed: Mild Impairment Gait with Horizontal Head Turns: Mild Impairment Gait with Vertical Head Turns: Mild Impairment Gait and Pivot Turn: Mild Impairment Step Over Obstacle: Mild Impairment Step Around Obstacles: Mild Impairment Steps: Mild Impairment Total Score: 16       Pertinent Vitals/Pain Pain Assessment: No/denies pain    Home Living Family/patient expects to be discharged to:: Private residence Living Arrangements: Alone Available Help at Discharge: Family;Available PRN/intermittently Type of Home: Apartment Home Access: Stairs to enter Entrance Stairs-Rails: Right Entrance Stairs-Number of Steps: flight(second floor apartment) Home Layout: Two level Home Equipment:  None Additional Comments: son reports his lease ends this month, looking for another home possibly ALF    Prior Function Level of Independence: Independent          Comments: 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     Hand Dominance   Dominant Hand: Right    Extremity/Trunk Assessment   Upper Extremity Assessment Upper Extremity Assessment: Overall WFL for tasks assessed    Lower Extremity Assessment Lower Extremity Assessment: Overall WFL for tasks assessed(reports numbness in bilat feet)    Cervical / Trunk Assessment Cervical / Trunk Assessment: Normal  Communication   Communication: No difficulties  Cognition Arousal/Alertness: Awake/alert Behavior During Therapy: WFL for tasks assessed/performed Overall Cognitive Status: Difficult to assess                                 General Comments: pt with noted sequencing difficulty when using hand sanitizer but ultimatey       General Comments General comments (skin integrity, edema, etc.): BP 188/98, noted to be high    Exercises     Assessment/Plan    PT Assessment Patient needs continued PT services  PT Problem List Decreased strength;Decreased activity tolerance;Decreased balance;Decreased mobility;Decreased knowledge of use of DME;Decreased safety awareness;Decreased cognition;Decreased knowledge of precautions       PT Treatment Interventions Gait training;Stair training;Functional mobility training;Therapeutic activities;Balance training;Therapeutic exercise;Neuromuscular re-education;Cognitive remediation;Patient/family education    PT Goals (Current goals can be found in the Care Plan section)  Acute Rehab PT Goals Patient Stated Goal: go home PT Goal Formulation: With patient/family Time For Goal Achievement: 08/26/19 Potential to Achieve Goals: Good    Frequency Min 4X/week   Barriers to discharge Decreased caregiver support daughter coming to stay for 2 weeks, they're looking for assisted living facility    Co-evaluation               AM-PAC PT "6 Clicks" Mobility  Outcome Measure Help  needed turning from your back to your side while in a flat bed without using bedrails?: None Help needed moving from lying on your back to sitting on the side of a flat bed without using bedrails?: None Help needed moving to and from a bed to a chair (including a wheelchair)?: A Little Help needed standing up from a chair using your arms (e.g., wheelchair or bedside chair)?: A Little Help needed to walk in hospital room?: A Little Help needed climbing 3-5 steps with a railing? : A Little 6 Click Score: 20    End of Session Equipment Utilized During Treatment: Gait belt Activity Tolerance: Patient tolerated treatment well Patient left: in chair;with call bell/phone within reach;with chair alarm set;with family/visitor present Nurse Communication: Mobility status PT Visit Diagnosis: Unsteadiness on feet (R26.81);Difficulty in walking, not elsewhere classified (R26.2)    Time: [id number] PT Time Calculation (min) (ACUTE ONLY): 31 min   Charges:   PT Evaluation $PT Eval Moderate Complexity: 1 Mod PT Treatments $Gait Training: 8-22 mins        Kittie Plater, PT, DPT Acute Rehabilitation Services Pager #: [phone] Office #: [phone]   Berline Lopes 08/12/2019, 10:27 AM

## 2019-08-12 NOTE — Progress Notes (Addendum)
Inpatient Diabetes Program Recommendations  AACE/ADA: New Consensus Statement on Inpatient Glycemic Control (2015)  Target Ranges:  Prepandial:   less than 140 mg/dL      Peak postprandial:   less than 180 mg/dL (1-2 hours)      Critically ill patients:  140 - 180 mg/dL   Lab Results  Component Value Date   GLUCAP 315 (H) 08/12/2019   HGBA1C 11.6 (H) 08/11/2019    Review of Glycemic Control Results for Phillip Camacho, Phillip Camacho (MRN [id number]) as of 08/12/2019 11:09  Ref. Range 08/11/2019 10:41 08/11/2019 17:11 08/11/2019 21:19 08/12/2019 09:19  Glucose-Capillary Latest Ref Range: 70 - 99 mg/dL 434 (H) 297 (H) 226 (H) 315 (H)   Outpatient Diabetes medications: metformin 500 mg bid Current orders for Inpatient glycemic control: Lantus 10 units + Novolog 0-9 units tidwc.  Inpatient Diabetes Program Recommendations:   Please consider: -Increase Lantus to 12 units -Add Novolog hs 0-5 units  Thank you, Bethena Roys E. Sricharan Lacomb, RN, MSN, CDE  Diabetes Coordinator Inpatient Glycemic Control Team Team Pager [phone] (8am-5pm) 08/12/2019 11:11 AM

## 2019-08-12 NOTE — Progress Notes (Addendum)
PROGRESS NOTE  Phillip Camacho [id number] DOB: [birth year] DOA: 08/11/2019 PCP: Patient, No Pcp Per  HPI/Recap of past 24 hours: Phillip Camacho is a 48 y.o. male with medical history significant for hypertension, diabetes, tobacco abuse, presents to the ED after son found patient to be very confused, slurred speech, drooling, left-sided facial droop, left-sided weakness, with some left-sided chest pain this a.m. Son last saw patient well around 8 PM on 08/10/2019.  On initial presentation, patient was noted to be altered, with significant slurred speech.  By my interview, patient was more awake, able to answer simple questions in [place], son at bedside.  Patient complains of reproducible left-sided chest pain, reports has been ongoing for about a couple of weeks, nothing makes it better or worse.  Denies any associated nausea/vomiting, shortness of breath, abdominal pain, dysuria, cough/sore throat.  Patient has not seen a doctor in years.  Patient was scheduled to see nephrology at [facility] next month.  ED Course: Afebrile, noted to be in hypertensive crisis with BP 228/95, saturating well on room air, labs showed glucose of 446, normal bicarb/anion gap, creatinine of 2.72, BNP 591.6, troponin 17-->18, WBC 11.3, UDS negative, all level negative, UA negative except for glucose greater than 500, as well as protein greater than 300.  CT head showed recent infarct in the right cerebellum.  Teleneurology was consulted.  MRI pending, if positive will be transferred to [facility] for further evaluation.  08/12/19: Patient was seen and examined at his bedside this morning.  Reports numbness in his feet.  Also reports generalized weakness.  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 Seen by neurology, stroke team.  Appreciate recommendations. Ongoing stroke work-up Hemoglobin A1c 11.6 goal less than 7.0 LDL pending 2D echo pending Negative neck MRA Twelve-lead EKG personally reviewed showing sinus rhythm with rate of 87 and QTC 453. Medical health PT with 24-hour supervision/assistance OT recommends no OT follow-up Speech evaluation pending Continue fall precautions Possible TEE and loop recorder placement tomorrow.  Appreciate neurology assistance in contacting cardiology to arrange this.  Type 2 diabetes with hyperglycemia Hemoglobin A1c 11.6 Continue Lantus, increase to 12 units nightly NovoLog 4 units 3 times daily Continue insulin sliding scale, sensitive  Right pleural effusion Unclear etiology Independently reviewed chest x-ray done on admission which showed right pleural effusion. O2 saturation currently stable 100% on room air Continue to monitor  Elevated BNP No prior history of heart failure, he denies. Presented with BNP greater than 500. Lasix 80 mg as needed at home, hold due to AKI 2D echo pending Monitor volume status Start strict I's and O's and daily weight  AKI Previous records indicate baseline creatinine 0.8 with GFR greater than 60 Presented with creatinine of 2.72 with GFR of 26 Started on IV fluid hydration normal saline at 100 cc/h Reduced to 50 cc/h x 1 day in light of elevated BNP and right pleural effusion. Hold Lasix Continue to avoid nephrotoxins Repeat BMP daily Monitor urine output  Ambulatory dysfunction PT OT assessed Recommendation for home health PT Continue physical therapy Fall precaution.  Tobacco abuse Tobacco cessation counseling done at bedside Nicotine patch as needed  Essential hypertension No antihypertensives at home On as needed p.o. Lasix at home Ongoing permissive hypertension Gradually normalize blood pressure  DVT prophylaxis: Heparin [place]  Code Status: Full  Family  Communication: Son at bedside  Disposition Plan:  Patient is currently not appropriate for discharge at this time due to ongoing stroke work-up.  Patient requires at least 2 midnights for further evaluation and treatment of present condition.  Consults called:  Neurology stroke team      Objective: Vitals:   08/12/19 0505 08/12/19 0556 08/12/19 0857 08/12/19 1143  BP: (!) 206/87 (!) 191/88  (!) 171/86  Pulse: 91 92  100  Resp: 20 20  17   Temp: 97.7 F (36.5 C)  98.7 F (37.1 C) 97.6 F (36.4 C)  TempSrc: Oral  Oral Oral  SpO2: 99% 98%  100%  Weight:        Intake/Output Summary (Last 24 hours) at 08/12/2019 1426 Last data filed at 08/12/2019 1155 Gross per 24 hour  Intake 1120 ml  Output 720 ml  Net 400 ml   Filed Weights   08/11/19 1040  Weight: 61.7 kg    Exam:   General: 48 y.o. year-old male well developed well nourished in no acute distress.  Alert and oriented x3.  Cardiovascular: Regular rate and rhythm with no rubs or gallops.  No thyromegaly or JVD noted.    Respiratory: Mild rales at bases with no wheezing noted. Good inspiratory effort.  Abdomen: Soft nontender nondistended with normal bowel sounds x4 quadrants.  Musculoskeletal: No lower extremity edema. 2/4 pulses in all 4 extremities.  Psychiatry: Mood is appropriate for condition and setting   Data Reviewed: CBC: Recent Labs  Lab 08/11/19 1048 08/11/19 1054  WBC 11.3*  --   NEUTROABS 8.1*  --   HGB 13.4 14.6  HCT 41.7 43.0  MCV 87.1  --   PLT 494*  --    Basic Metabolic Panel: Recent Labs  Lab 08/11/19 1048 08/11/19 1054  NA 130* 133*  K 3.2* 3.3*  CL 97* 96*  CO2 27  --   GLUCOSE 446* 440*  BUN 22* 22*  CREATININE 2.72* 2.60*  CALCIUM 8.1*  --    GFR: CrCl cannot be calculated (Unknown ideal weight.). Liver Function Tests: Recent Labs  Lab 08/11/19 1048  AST 7*  ALT 10  ALKPHOS 112  BILITOT 0.3  PROT 5.7*  ALBUMIN 1.8*   No results for input(s): LIPASE,  AMYLASE in the last 168 hours. No results for input(s): AMMONIA in the last 168 hours. Coagulation Profile: Recent Labs  Lab 08/11/19 1048  INR 0.8   Cardiac Enzymes: No results for input(s): CKTOTAL, CKMB, CKMBINDEX, TROPONINI in the last 168 hours. BNP (last 3 results) No results for input(s): PROBNP in the last 8760 hours. HbA1C: Recent Labs    08/11/19 1047  HGBA1C 11.6*   CBG: Recent Labs  Lab 08/11/19 1041 08/11/19 1711 08/11/19 2119 08/12/19 0919 08/12/19 1146  GLUCAP 434* 297* 226* 315* 116*   Lipid Profile: No results for input(s): CHOL, HDL, LDLCALC, TRIG, CHOLHDL, LDLDIRECT in the last 72 hours. Thyroid Function Tests: Recent Labs    08/11/19 1047  TSH 2.160  FREET4 1.08   Anemia Panel: Recent Labs    08/12/19 1006  VITAMINB12 326  FOLATE 6.6   Urine analysis:    Component Value Date/Time   COLORURINE STRAW (A) 08/11/2019 1254   APPEARANCEUR CLEAR 08/11/2019 1254   LABSPEC 1.011 08/11/2019 1254   PHURINE 7.0 08/11/2019 1254   GLUCOSEU >=500 (A) 08/11/2019 1254   HGBUR SMALL (A) 08/11/2019 1254   BILIRUBINUR NEGATIVE 08/11/2019 1254  KETONESUR NEGATIVE 08/11/2019 1254   PROTEINUR >=300 (A) 08/11/2019 1254   NITRITE NEGATIVE 08/11/2019 1254   LEUKOCYTESUR NEGATIVE 08/11/2019 1254   Sepsis Labs: @LABRCNTIP (procalcitonin:4,lacticidven:4)  ) Recent Results (from the past 240 hour(s))  SARS CORONAVIRUS 2 (TAT 6-24 HRS) Nasopharyngeal Nasopharyngeal Swab     Status: None   Collection Time: 08/11/19 11:16 AM   Specimen: Nasopharyngeal Swab  Result Value Ref Range Status   SARS Coronavirus 2 NEGATIVE NEGATIVE Final    Comment: (NOTE) SARS-CoV-2 target nucleic acids are NOT DETECTED. The SARS-CoV-2 RNA is generally detectable in upper and lower respiratory specimens during the acute phase of infection. Negative results do not preclude SARS-CoV-2 infection, do not rule out co-infections with other pathogens, and should not be used as  the sole basis for treatment or other patient management decisions. Negative results must be combined with clinical observations, patient history, and epidemiological information. The expected result is Negative. Fact Sheet for Patients: SugarRoll.be Fact Sheet for Healthcare Providers: https://www.woods-mathews.com/ This test is not yet approved or cleared by the [place] FDA and  has been authorized for detection and/or diagnosis of SARS-CoV-2 by FDA under an Emergency Use Authorization (EUA). This EUA will remain  in effect (meaning this test can be used) for the duration of the COVID-19 declaration under Section 56 4(b)(1) of the Act, 21 U.S.C. section 360bbb-3(b)(1), unless the authorization is terminated or revoked sooner. Performed at [facility], [place] [address] [place], [place] [address]   Culture, blood (routine x 2)     Status: None (Preliminary result)   Collection Time: 08/11/19  5:45 PM   Specimen: BLOOD  Result Value Ref Range Status   Specimen Description   Final    BLOOD RIGHT ANTECUBITAL Performed at [facility] [address], [place], [place] [address]    Special Requests   Final    BOTTLES DRAWN AEROBIC AND ANAEROBIC Blood Culture adequate volume Performed at [facility] [address] [place], [place] [address]    Culture   Final    NO GROWTH < 12 HOURS Performed at [facility] [address] [place], [place] [address]    Report Status PENDING  Incomplete      Studies: Mr Angio Head Wo Contrast  Result Date: 08/12/2019 CLINICAL DATA:  Stroke follow-up EXAM: MRA HEAD WITHOUT CONTRAST TECHNIQUE: Angiographic images of the Circle of Willis were obtained using MRA technique without intravenous contrast. COMPARISON:  Brain MRI from yesterday FINDINGS: Anterior circulation: Atheromatous irregularity of cavernous carotids with up to 50% stenosis at the  right paraclinoid segment. Degree of right ACA hypoplasia with at least moderate narrowed appearance of right A1 and A2 segments. Mild atheromatous irregularity of medium size vessels. Negative for aneurysm. Posterior circulation: The vertebral and basilar arteries are smooth and widely patent. Dominant left PICA and right AICA. Moderate stenosis at the right P2 segment and high-grade stenosis with apparent flow gap at the upper branch right PCA. IMPRESSION: Age advanced intracranial atherosclerosis with affected sites described above. Electronically Signed   By: Monte Fantasia M.D.   On: 08/12/2019 04:56   Mr Angio Neck Wo Contrast  Result Date: 08/12/2019 CLINICAL DATA:  Stroke follow-up EXAM: MRA NECK WITHOUT CONTRAST TECHNIQUE: Angiographic images of the neck were obtained using MRA technique without intravenous contrast. COMPARISON:  None. FINDINGS: The arch was not covered in the field of view. There appears to be 3 vessel branching with no evidence of great vessel stenoses.  There is antegrade flow in the bilateral carotid and vertebral circulation. No significant stenosis, beading, or ulceration. There is mild narrowing at the proximal right ECA. IMPRESSION: Essentially negative neck MRA. Electronically Signed   By: Monte Fantasia M.D.   On: 08/12/2019 04:58   Mr Brain Wo Contrast  Result Date: 08/11/2019 CLINICAL DATA:  Left-sided weakness, facial droop, and slurred speech. History of hypertension and diabetes. EXAM: MRI HEAD WITHOUT CONTRAST TECHNIQUE: Multiplanar, multiecho pulse sequences of the brain and surrounding structures were obtained without intravenous contrast. COMPARISON:  Head CT 08/11/2019 FINDINGS: Brain: There is a 1 cm acute infarct medially in the right cerebellar hemisphere. Additionally, there are scattered punctate acute infarcts involving the left greater than right cerebral hemispheres, predominantly white matter as well as left thalamus. Scattered small foci of T2  hyperintensity elsewhere in the cerebral white matter bilaterally and brainstem/right cerebral peduncle are nonspecific though may reflect mild chronic small vessel ischemic disease given vascular risk factors. Chronic lacunar infarcts are present in the dorsal thalami bilaterally. The ventricles and sulci are within normal limits in size for age. A cavum septum pellucidum is incidentally noted. No intracranial hemorrhage, mass, midline shift, or extra-axial fluid collection is identified. Vascular: Major intracranial vascular flow voids are preserved. Skull and upper cervical spine: Unremarkable bone marrow signal. Sinuses/Orbits: Unremarkable orbits. Trace left mastoid fluid. Mild left ethmoid air cell mucosal thickening. Other: 2 cm lipoma in the scalp posterior to the right ear. IMPRESSION: 1. Small acute infarcts in the right cerebellum and both cerebral hemispheres. 2. Mild chronic small vessel ischemic disease. Electronically Signed   By: Logan Bores M.D.   On: 08/11/2019 15:58   US Renal  Result Date: 08/11/2019 CLINICAL DATA:  Acute kidney injury EXAM: RENAL / URINARY TRACT ULTRASOUND COMPLETE COMPARISON:  None. FINDINGS: Right Kidney: Renal measurements: 10.7 x 5.6 x 6.5 cm = volume: 202.2 mL . Echogenicity is increased. Renal cortical thickness is normal. No mass, perinephric fluid, or hydronephrosis visualized. No sonographically demonstrable calculus or ureterectasis. Left Kidney: Renal measurements: 11.3 x 7.4 x 4.4 cm = volume: 192.7 mL. Echogenicity is increased. Renal cortical thickness is normal. No mass, perinephric fluid, or hydronephrosis visualized. No sonographically demonstrable calculus or ureterectasis. Bladder: Appears normal for degree of bladder distention. Other: None IMPRESSION: Increased renal echogenicity, a finding indicative of medical renal disease. Renal cortical thickness normal. No obstructing focus on either side. Study otherwise unremarkable. Electronically Signed   By:  Lowella Grip III M.D.   On: 08/11/2019 14:36   Dg Chest Port 1 View  Result Date: 08/11/2019 CLINICAL DATA:  Stroke. EXAM: PORTABLE CHEST 1 VIEW COMPARISON:  None. FINDINGS: The heart size and mediastinal contours are within normal limits. Normal pulmonary vascularity. Small to moderate right and small left pleural effusions with adjacent bibasilar atelectasis. No pneumothorax. No acute osseous abnormality. IMPRESSION: 1. Small to moderate right and small left pleural effusions with adjacent bibasilar atelectasis. Electronically Signed   By: Titus Dubin M.D.   On: 08/11/2019 15:10   Vas [place] Lower Extremity Venous (dvt)  Result Date: 08/12/2019  Lower Venous Study Indications: Stroke.  Performing Technologist: Antonieta Pert RDMS, RVT  Examination Guidelines: A complete evaluation includes B-mode imaging, spectral Doppler, color Doppler, and power Doppler as needed of all accessible portions of each vessel. Bilateral testing is considered an integral part of a complete examination. Limited examinations for reoccurring indications may be performed as noted.  +---------+---------------+---------+-----------+----------+--------------+  RIGHT     Compressibility Phasicity Spontaneity Properties Thrombus  Aging  +---------+---------------+---------+-----------+----------+--------------+  CFV       Full            Yes       Yes                                    +---------+---------------+---------+-----------+----------+--------------+  SFJ       Full                                                             +---------+---------------+---------+-----------+----------+--------------+  FV Prox   Full                                                             +---------+---------------+---------+-----------+----------+--------------+  FV Mid    Full                                                             +---------+---------------+---------+-----------+----------+--------------+  FV Distal Full                                                              +---------+---------------+---------+-----------+----------+--------------+  PFV       Full                                                             +---------+---------------+---------+-----------+----------+--------------+  POP       Full            Yes       Yes                                    +---------+---------------+---------+-----------+----------+--------------+  PTV       Full                                                             +---------+---------------+---------+-----------+----------+--------------+  PERO      Full                                                             +---------+---------------+---------+-----------+----------+--------------+  GSV       Full                                                             +---------+---------------+---------+-----------+----------+--------------+ atherosclerotic changes throughout extremity.  +---------+---------------+---------+-----------+----------+--------------+  LEFT      Compressibility Phasicity Spontaneity Properties Thrombus Aging  +---------+---------------+---------+-----------+----------+--------------+  CFV       Full            Yes       Yes                                    +---------+---------------+---------+-----------+----------+--------------+  SFJ       Full                                                             +---------+---------------+---------+-----------+----------+--------------+  FV Prox   Full                                                             +---------+---------------+---------+-----------+----------+--------------+  FV Mid    Full                                                             +---------+---------------+---------+-----------+----------+--------------+  FV Distal Full                                                             +---------+---------------+---------+-----------+----------+--------------+  PFV       Full                                                              +---------+---------------+---------+-----------+----------+--------------+  POP       Full            Yes       Yes                                    +---------+---------------+---------+-----------+----------+--------------+  PTV       Full                                                             +---------+---------------+---------+-----------+----------+--------------+  PERO      Full                                                             +---------+---------------+---------+-----------+----------+--------------+  GSV       Full                                                             +---------+---------------+---------+-----------+----------+--------------+ atherosclerotic changes throughout extremity.    Summary: Right: There is no evidence of deep vein thrombosis in the lower extremity. No cystic structure found in the popliteal fossa. Left: There is no evidence of deep vein thrombosis in the lower extremity. No cystic structure found in the popliteal fossa.  *See table(s) above for measurements and observations.    Preliminary     Scheduled Meds:  aspirin  300 mg Rectal Daily   Or   aspirin  325 mg Oral Daily   atorvastatin  80 mg Oral q1800   clopidogrel  75 mg Oral Daily   heparin  5,000 Units Subcutaneous Q8H   insulin aspart  0-5 Units Subcutaneous QHS   insulin aspart  0-9 Units Subcutaneous TID WC   insulin aspart  4 Units Subcutaneous TID WC   insulin glargine  12 Units Subcutaneous Q24H   nicotine  21 mg Transdermal Daily    Continuous Infusions:  sodium chloride 50 mL/hr at 08/12/19 [id number]     LOS: 1 day     [name], MD [facility] Pager [phone]  If 7PM-7AM, please contact night-coverage [email or web address] Password [email or web address] 08/12/2019, 2:26 PM

## 2019-08-12 NOTE — Progress Notes (Signed)
    [facility] has been requested to perform a transesophageal echocardiogram on 08/13/2019 for stroke.  After careful review of history and examination, the risks and benefits of transesophageal echocardiogram have been explained including risks of esophageal damage, perforation (1:10,000 risk), bleeding, pharyngeal hematoma as well as other potential complications associated with conscious sedation including aspiration, arrhythmia, respiratory failure and death. Alternatives to treatment were discussed, questions were answered. Patient is willing to proceed.   TEE scheduled for 08/13/2019 at 12:30pm with Dr. Sallyanne Kuster.  Roby Lofts, PA-C 08/12/2019 3:20 PM

## 2019-08-12 NOTE — Evaluation (Signed)
Occupational Therapy Evaluation Patient Details Name: Phillip Camacho MRN: [id number] DOB: 03-Dec-1970 Today's Date: 08/12/2019    History of Present Illness Phillip Camacho is a 48 y.o. male with medical history significant for hypertension, diabetes, tobacco abuse, presents to the ED after son found patient to be very confused, slurred speech, drooling, left-sided facial droop, left-sided weakness, with some left-sided chest pain this a.m.    Clinical Impression   PT admitted with R cerebellar lacunar infarct. Pt currently with functional limitiations due to the deficits listed below (see OT problem list). OT to follow acutely for higher level balance changes for home environment with adls. Pt does cook his meals and drives to grocery store.  Pt will benefit from skilled OT to increase their independence and safety with adls and balance to allow discharge none.     Follow Up Recommendations  No OT follow up    Equipment Recommendations  None recommended by OT    Recommendations for Other Services       Precautions / Restrictions Precautions Precautions: Fall Precaution Comments: limited english Restrictions Weight Bearing Restrictions: No      Mobility Bed Mobility Overal bed mobility: Modified Independent             General bed mobility comments: in hall with PT on arrival  Transfers Overall transfer level: Needs assistance Equipment used: None Transfers: Sit to/from Stand Sit to Stand: Supervision         General transfer comment: min guard for safety due to first time up in 24 hours, mildly unsteady initially but no physical assist needed    Balance Overall balance assessment: Mild deficits observed, not formally tested                               Standardized Balance Assessment Standardized Balance Assessment : Dynamic Gait Index   Dynamic Gait Index Level Surface: Mild Impairment Change in Gait Speed: Mild Impairment Gait with  Horizontal Head Turns: Mild Impairment Gait with Vertical Head Turns: Mild Impairment Gait and Pivot Turn: Mild Impairment Step Over Obstacle: Mild Impairment Step Around Obstacles: Mild Impairment Steps: Mild Impairment Total Score: 16     ADL either performed or assessed with clinical judgement   ADL Overall ADL's : At baseline                                       General ADL Comments: pt able to complete oral care sink level , bathroom transfer, LB dressing and coffee prep to drink it. pt pleasant and thanking thearpist for help. son arriving at end of session and educated on recommendation     Vision         Perception     Praxis      Pertinent Vitals/Pain Pain Assessment: No/denies pain     Hand Dominance Right   Extremity/Trunk Assessment Upper Extremity Assessment Upper Extremity Assessment: Overall WFL for tasks assessed   Lower Extremity Assessment Lower Extremity Assessment: Defer to PT evaluation   Cervical / Trunk Assessment Cervical / Trunk Assessment: Normal   Communication Communication Communication: No difficulties   Cognition Arousal/Alertness: Awake/alert Behavior During Therapy: WFL for tasks assessed/performed Overall Cognitive Status: Within Functional Limits for tasks assessed  General Comments: Pt asked to pay with paper money for groceries 13 dollars. what money would he give the cashier. pt reports 10 and 3 ones. pt asked to pay a different way. pt states 20 and they give me 7 back. pt states i could give them 13 ones. pt demonstrates understanding of payment . pt drives at baseline but son manages all medicaiton and bill due to language barriers   General Comments  noted to have elevated BP throughout session. Son educated on elevated BP sign symptoms of stroke and risk factors. Booklet present and son provided to help translate to patient. Pt smokes and jokes asking for  cigarette. son states 'we have talked about that" SOn and daughter from [place] to help move patient     Exercises     Shoulder Instructions      Home Living Family/patient expects to be discharged to:: Private residence Living Arrangements: Alone Available Help at Discharge: Family;Available PRN/intermittently Type of Home: Apartment Home Access: Stairs to enter Entrance Stairs-Number of Steps: flight(second floor apartment) Entrance Stairs-Rails: Right Home Layout: Two level Alternate Level Stairs-Number of Steps: flight Alternate Level Stairs-Rails: Right Bathroom Shower/Tub: Tub/shower unit;Walk-in shower(reports he has 2 showers)   Biochemist, clinical: Standard     Home Equipment: None   Additional Comments: son reports his lease ends this month, looking for another home possibly ALF . son reports having outside cameras and being able to see patient come and go . son also checks on patient prior to work and after work daily      Prior Functioning/Environment Level of Independence: Independent        Comments: reports he was working in a Company [employer] but then the place shut down this past january        OT Problem List:        OT Treatment/Interventions:      OT Goals(Current goals can be found in the care plan section) Acute Rehab OT Goals Patient Stated Goal: go home Potential to Achieve Goals: Good  OT Frequency:     Barriers to D/C:            Co-evaluation              AM-PAC OT "6 Clicks" Daily Activity     Outcome Measure Help from another person eating meals?: None Help from another person taking care of personal grooming?: None Help from another person toileting, which includes using toliet, bedpan, or urinal?: None Help from another person bathing (including washing, rinsing, drying)?: None Help from another person to put on and taking off regular upper body clothing?: None Help from another person to put on and taking off regular lower  body clothing?: None 6 Click Score: 24   End of Session Equipment Utilized During Treatment: Gait belt Nurse Communication: Mobility status;Precautions  Activity Tolerance: Patient tolerated treatment well Patient left: in chair;with call bell/phone within reach;with family/visitor present  OT Visit Diagnosis: Muscle weakness (generalized) (M62.81)                Time: [id number] OT Time Calculation (min): 26 min Charges:  OT General Charges $OT Visit: 1 Visit OT Evaluation $OT Eval Moderate Complexity: 1 Mod   Jeri Modena, OTR/L  Acute Rehabilitation Services Pager: [phone] Office: [phone] .   Jeri Modena 08/12/2019, 11:47 AM

## 2019-08-12 NOTE — Consult Note (Signed)
Stroke Neurology Consultation Note  Consult Requested by: [facility], seen by teleneurology at [facility] ED 08/11/2019  Reason for Consult: stroke symptoms  Consult Date:  08/12/19  The history was obtained from the pt, his son and medical record.  During history and examination, all items were  able to obtain unless otherwise noted.  History of Present Illness:  Phillip Camacho is an 48 y.o. [place] male with PMH of HTN, DM, and tobacco abusepresented to [facility] ED with confusion, slurred speech, L facial droop, and L side weakness. teleneurology called for code stroke. NIHSS = 5 (question, left facial droop, dysarthria, b/l LE drift). CT showed subacute right cerebellar infarct.  Glucose was high.  Admitted for further stroke work-up.  Overnight, patient symptom gradually getting better and now resolved.  Stroke work-up with MRI showed right cerebellum, bilateral punctate MCA/PCA, and right PCA and right thalamus infarcts.  As per son, patient not compliant with medication at home, not taking BP and glucose level.  Found to have elevated creatinine, with plans to see nephrology soon.  Current smoker, denies alcohol or illicit drugs.   Past Medical History:  Diagnosis Date  . Diabetes mellitus without complication ([place])      History reviewed. No pertinent surgical history.  History reviewed. No pertinent family history.   Social History:  reports that he has been smoking. He has never used smokeless tobacco. He reports that he does not drink alcohol. No history on file for drug.  Review of Systems: A full ROS was attempted today and was able to be performed.  Systems assessed include - Constitutional, Eyes, HENT, Respiratory, Cardiovascular, Gastrointestinal, Genitourinary, Integument/breast, Hematologic/lymphatic, Musculoskeletal, Neurological, Behavioral/Psych, Endocrine, Allergic/Immunologic - with pertinent responses as per HPI.  Allergies: No Known  Allergies   Medications:  Prior to Admission:  Medications Prior to Admission  Medication Sig Dispense Refill Last Dose  . furosemide (LASIX) 80 MG tablet Take 1 tablet by mouth daily as needed for fluid.   Unknown  . ibuprofen (ADVIL,MOTRIN) 600 MG tablet Take 1 tablet (600 mg total) by mouth every 8 (eight) hours as needed. (Patient not taking: Reported on 08/11/2019) 15 tablet 0 Not Taking at Unknown time  . metFORMIN (GLUCOPHAGE) 500 MG tablet Take 1 tablet (500 mg total) by mouth 2 (two) times daily with a meal. 60 tablet 11    Scheduled: . aspirin  300 mg Rectal Daily   Or  . aspirin  325 mg Oral Daily  . atorvastatin  80 mg Oral q1800  . clopidogrel  75 mg Oral Daily  . heparin  5,000 Units Subcutaneous Q8H  . insulin aspart  0-5 Units Subcutaneous QHS  . insulin aspart  0-9 Units Subcutaneous TID WC  . insulin aspart  4 Units Subcutaneous TID WC  . insulin glargine  12 Units Subcutaneous Q24H  . nicotine  21 mg Transdermal Daily   Continuous: . sodium chloride 50 mL/hr at 08/12/19 [id number]    Test Results: CBC:  Recent Labs  Lab 08/11/19 1048 08/11/19 1054  WBC 11.3*  --   NEUTROABS 8.1*  --   HGB 13.4 14.6  HCT 41.7 43.0  MCV 87.1  --   PLT 494*  --    Basic Metabolic Panel:  Recent Labs  Lab 08/11/19 1048 08/11/19 1054  NA 130* 133*  K 3.2* 3.3*  CL 97* 96*  CO2 27  --   GLUCOSE 446* 440*  BUN 22* 22*  CREATININE 2.72* 2.60*  CALCIUM 8.1*  --    Liver Function Tests: Recent Labs  Lab 08/11/19 1048  AST 7*  ALT 10  ALKPHOS 112  BILITOT 0.3  PROT 5.7*  ALBUMIN 1.8*   No results for input(s): LIPASE, AMYLASE in the last 168 hours. No results for input(s): AMMONIA in the last 168 hours. Coagulation Studies:  Recent Labs    08/11/19 1048  LABPROT 11.4  INR 0.8   Cardiac Enzymes: No results for input(s): CKTOTAL, CKMB, CKMBINDEX, TROPONINI in the last 168 hours. BNP: Invalid input(s): POCBNP CBG:  Recent Labs  Lab 08/11/19 1041  08/11/19 1711 08/11/19 2119 08/12/19 0919 08/12/19 1146  GLUCAP 434* 297* 226* 315* 116*   Urinalysis:  Recent Labs  Lab 08/11/19 1254  COLORURINE STRAW*  LABSPEC 1.011  PHURINE 7.0  GLUCOSEU >=500*  HGBUR SMALL*  BILIRUBINUR NEGATIVE  KETONESUR NEGATIVE  PROTEINUR >=300*  NITRITE NEGATIVE  LEUKOCYTESUR NEGATIVE   Microbiology:  Results for orders placed or performed during the hospital encounter of 08/11/19  SARS CORONAVIRUS 2 (TAT 6-24 HRS) Nasopharyngeal Nasopharyngeal Swab     Status: None   Collection Time: 08/11/19 11:16 AM   Specimen: Nasopharyngeal Swab  Result Value Ref Range Status   SARS Coronavirus 2 NEGATIVE NEGATIVE Final    Comment: (NOTE) SARS-CoV-2 target nucleic acids are NOT DETECTED. The SARS-CoV-2 RNA is generally detectable in upper and lower respiratory specimens during the acute phase of infection. Negative results do not preclude SARS-CoV-2 infection, do not rule out co-infections with other pathogens, and should not be used as the sole basis for treatment or other patient management decisions. Negative results must be combined with clinical observations, patient history, and epidemiological information. The expected result is Negative. Fact Sheet for Patients: SugarRoll.be Fact Sheet for Healthcare Providers: https://www.woods-mathews.com/ This test is not yet approved or cleared by the [place] FDA and  has been authorized for detection and/or diagnosis of SARS-CoV-2 by FDA under an Emergency Use Authorization (EUA). This EUA will remain  in effect (meaning this test can be used) for the duration of the COVID-19 declaration under Section 56 4(b)(1) of the Act, 21 U.S.C. section 360bbb-3(b)(1), unless the authorization is terminated or revoked sooner. Performed at [facility], [place] [address] [place], [place] [address]   Culture, blood (routine x 2)     Status: None (Preliminary result)    Collection Time: 08/11/19  5:45 PM   Specimen: BLOOD  Result Value Ref Range Status   Specimen Description   Final    BLOOD RIGHT ANTECUBITAL Performed at [facility] [address], [place], [place] [address]    Special Requests   Final    BOTTLES DRAWN AEROBIC AND ANAEROBIC Blood Culture adequate volume Performed at [facility] [address] [place], [place] [address]    Culture   Final    NO GROWTH < 12 HOURS Performed at [facility] [address] [place], [place] [address]    Report Status PENDING  Incomplete   Lipid Panel: No results found for: CHOL, TRIG, HDL, CHOLHDL, VLDL, LDLCALC HgbA1c:  Lab Results  Component Value Date   HGBA1C 11.6 (H) 08/11/2019   Urine Drug Screen:     Component Value Date/Time   LABOPIA NONE DETECTED 08/11/2019 1254   COCAINSCRNUR NONE DETECTED 08/11/2019 1254   LABBENZ NONE DETECTED 08/11/2019 1254   AMPHETMU NONE DETECTED 08/11/2019 1254   THCU NONE DETECTED 08/11/2019 1254   LABBARB NONE DETECTED 08/11/2019  1254    Alcohol Level:  Recent Labs  Lab 08/11/19 1048  ETH <10    Ct Head Wo Contrast  Result Date: 08/11/2019 CLINICAL DATA:  Left-sided facial droop with slurred speech. Left-sided weakness EXAM: CT HEAD WITHOUT CONTRAST TECHNIQUE: Contiguous axial images were obtained from the base of the skull through the vertex without intravenous contrast. COMPARISON:  None. FINDINGS: Brain: Ventricles are normal in size and configuration. There is a small cavum septum pellucidum, an anatomic variant. There is no intracranial mass, hemorrhage, extra-axial fluid collection, or midline shift. There is a focal infarct in the posterior aspect of the dentate nucleus on the right medially which may be recent. No other focal infarct evident. Vascular: No hyperdense vessel evident. There is calcification in each carotid siphon region. Skull: The bony calvarium appears intact. Sinuses/Orbits: There  is mucosal thickening in several ethmoid air cells. Other visualized paranasal sinuses are clear. Orbits appear symmetric bilaterally. There is rightward deviation of the nasal septum. Other: Visualized mastoid air cells are clear. IMPRESSION: Age uncertain and potentially recent infarct in the posteromedial aspect of the dentate nucleus of the right cerebellum. No other parenchymal abnormality to suggest infarct noted. No mass or hemorrhage. There are foci of arterial vascular calcification. There is mucosal thickening in several ethmoid air cells. There is rightward deviation of the nasal septum. Electronically Signed   By: Lowella Grip III M.D.   On: 08/11/2019 11:08   Mr Angio Head Wo Contrast  Result Date: 08/12/2019 CLINICAL DATA:  Stroke follow-up EXAM: MRA HEAD WITHOUT CONTRAST TECHNIQUE: Angiographic images of the Circle of Willis were obtained using MRA technique without intravenous contrast. COMPARISON:  Brain MRI from yesterday FINDINGS: Anterior circulation: Atheromatous irregularity of cavernous carotids with up to 50% stenosis at the right paraclinoid segment. Degree of right ACA hypoplasia with at least moderate narrowed appearance of right A1 and A2 segments. Mild atheromatous irregularity of medium size vessels. Negative for aneurysm. Posterior circulation: The vertebral and basilar arteries are smooth and widely patent. Dominant left PICA and right AICA. Moderate stenosis at the right P2 segment and high-grade stenosis with apparent flow gap at the upper branch right PCA. IMPRESSION: Age advanced intracranial atherosclerosis with affected sites described above. Electronically Signed   By: Monte Fantasia M.D.   On: 08/12/2019 04:56   Mr Angio Neck Wo Contrast  Result Date: 08/12/2019 CLINICAL DATA:  Stroke follow-up EXAM: MRA NECK WITHOUT CONTRAST TECHNIQUE: Angiographic images of the neck were obtained using MRA technique without intravenous contrast. COMPARISON:  None. FINDINGS:  The arch was not covered in the field of view. There appears to be 3 vessel branching with no evidence of great vessel stenoses. There is antegrade flow in the bilateral carotid and vertebral circulation. No significant stenosis, beading, or ulceration. There is mild narrowing at the proximal right ECA. IMPRESSION: Essentially negative neck MRA. Electronically Signed   By: Monte Fantasia M.D.   On: 08/12/2019 04:58   Mr Brain Wo Contrast  Result Date: 08/11/2019 CLINICAL DATA:  Left-sided weakness, facial droop, and slurred speech. History of hypertension and diabetes. EXAM: MRI HEAD WITHOUT CONTRAST TECHNIQUE: Multiplanar, multiecho pulse sequences of the brain and surrounding structures were obtained without intravenous contrast. COMPARISON:  Head CT 08/11/2019 FINDINGS: Brain: There is a 1 cm acute infarct medially in the right cerebellar hemisphere. Additionally, there are scattered punctate acute infarcts involving the left greater than right cerebral hemispheres, predominantly white matter as well as left thalamus. Scattered small foci of T2 hyperintensity  elsewhere in the cerebral white matter bilaterally and brainstem/right cerebral peduncle are nonspecific though may reflect mild chronic small vessel ischemic disease given vascular risk factors. Chronic lacunar infarcts are present in the dorsal thalami bilaterally. The ventricles and sulci are within normal limits in size for age. A cavum septum pellucidum is incidentally noted. No intracranial hemorrhage, mass, midline shift, or extra-axial fluid collection is identified. Vascular: Major intracranial vascular flow voids are preserved. Skull and upper cervical spine: Unremarkable bone marrow signal. Sinuses/Orbits: Unremarkable orbits. Trace left mastoid fluid. Mild left ethmoid air cell mucosal thickening. Other: 2 cm lipoma in the scalp posterior to the right ear. IMPRESSION: 1. Small acute infarcts in the right cerebellum and both cerebral  hemispheres. 2. Mild chronic small vessel ischemic disease. Electronically Signed   By: Logan Bores M.D.   On: 08/11/2019 15:58   US Renal  Result Date: 08/11/2019 CLINICAL DATA:  Acute kidney injury EXAM: RENAL / URINARY TRACT ULTRASOUND COMPLETE COMPARISON:  None. FINDINGS: Right Kidney: Renal measurements: 10.7 x 5.6 x 6.5 cm = volume: 202.2 mL . Echogenicity is increased. Renal cortical thickness is normal. No mass, perinephric fluid, or hydronephrosis visualized. No sonographically demonstrable calculus or ureterectasis. Left Kidney: Renal measurements: 11.3 x 7.4 x 4.4 cm = volume: 192.7 mL. Echogenicity is increased. Renal cortical thickness is normal. No mass, perinephric fluid, or hydronephrosis visualized. No sonographically demonstrable calculus or ureterectasis. Bladder: Appears normal for degree of bladder distention. Other: None IMPRESSION: Increased renal echogenicity, a finding indicative of medical renal disease. Renal cortical thickness normal. No obstructing focus on either side. Study otherwise unremarkable. Electronically Signed   By: Lowella Grip III M.D.   On: 08/11/2019 14:36   Dg Chest Port 1 View  Result Date: 08/11/2019 CLINICAL DATA:  Stroke. EXAM: PORTABLE CHEST 1 VIEW COMPARISON:  None. FINDINGS: The heart size and mediastinal contours are within normal limits. Normal pulmonary vascularity. Small to moderate right and small left pleural effusions with adjacent bibasilar atelectasis. No pneumothorax. No acute osseous abnormality. IMPRESSION: 1. Small to moderate right and small left pleural effusions with adjacent bibasilar atelectasis. Electronically Signed   By: Titus Dubin M.D.   On: 08/11/2019 15:10   Vas [place] Lower Extremity Venous (dvt)  Result Date: 08/12/2019  Lower Venous Study Indications: Stroke.  Performing Technologist: Antonieta Pert RDMS, RVT  Examination Guidelines: A complete evaluation includes B-mode imaging, spectral Doppler, color Doppler, and  power Doppler as needed of all accessible portions of each vessel. Bilateral testing is considered an integral part of a complete examination. Limited examinations for reoccurring indications may be performed as noted.  +---------+---------------+---------+-----------+----------+--------------+ RIGHT    CompressibilityPhasicitySpontaneityPropertiesThrombus Aging +---------+---------------+---------+-----------+----------+--------------+ CFV      Full           Yes      Yes                                 +---------+---------------+---------+-----------+----------+--------------+ SFJ      Full                                                        +---------+---------------+---------+-----------+----------+--------------+ FV Prox  Full                                                        +---------+---------------+---------+-----------+----------+--------------+  FV Mid   Full                                                        +---------+---------------+---------+-----------+----------+--------------+ FV DistalFull                                                        +---------+---------------+---------+-----------+----------+--------------+ PFV      Full                                                        +---------+---------------+---------+-----------+----------+--------------+ POP      Full           Yes      Yes                                 +---------+---------------+---------+-----------+----------+--------------+ PTV      Full                                                        +---------+---------------+---------+-----------+----------+--------------+ PERO     Full                                                        +---------+---------------+---------+-----------+----------+--------------+ GSV      Full                                                         +---------+---------------+---------+-----------+----------+--------------+ atherosclerotic changes throughout extremity.  +---------+---------------+---------+-----------+----------+--------------+ LEFT     CompressibilityPhasicitySpontaneityPropertiesThrombus Aging +---------+---------------+---------+-----------+----------+--------------+ CFV      Full           Yes      Yes                                 +---------+---------------+---------+-----------+----------+--------------+ SFJ      Full                                                        +---------+---------------+---------+-----------+----------+--------------+ FV Prox  Full                                                        +---------+---------------+---------+-----------+----------+--------------+  FV Mid   Full                                                        +---------+---------------+---------+-----------+----------+--------------+ FV DistalFull                                                        +---------+---------------+---------+-----------+----------+--------------+ PFV      Full                                                        +---------+---------------+---------+-----------+----------+--------------+ POP      Full           Yes      Yes                                 +---------+---------------+---------+-----------+----------+--------------+ PTV      Full                                                        +---------+---------------+---------+-----------+----------+--------------+ PERO     Full                                                        +---------+---------------+---------+-----------+----------+--------------+ GSV      Full                                                        +---------+---------------+---------+-----------+----------+--------------+ atherosclerotic changes throughout extremity.    Summary: Right: There is no  evidence of deep vein thrombosis in the lower extremity. No cystic structure found in the popliteal fossa. Left: There is no evidence of deep vein thrombosis in the lower extremity. No cystic structure found in the popliteal fossa.  *See table(s) above for measurements and observations.    Preliminary      EKG: normal sinus rhythm, probable LA enlargement, probable LVH, baseline wander in V5.   Physical Examination: Temp:  [97.6 F (36.4 C)-98.7 F (37.1 C)] 97.6 F (36.4 C) (10/20 1143) Pulse Rate:  [78-100] 100 (10/20 1143) Resp:  [14-23] 17 (10/20 1143) BP: (146-206)/(72-92) 171/86 (10/20 1143) SpO2:  [96 %-100 %] 100 % (10/20 1143)  General - Well nourished, well developed, in no apparent distress.  Ophthalmologic - fundi not visualized due to noncooperation.  Cardiovascular - Regular rate and rhythm.  Mental Status -  Level of arousal and orientation to time, place, and person were intact. Language including  expression, naming, repetition, comprehension was assessed and found intact.  Cranial Nerves II - XII - II - Visual field intact OU. III, IV, VI - Extraocular movements intact. V - Facial sensation intact bilaterally. VII - Facial movement intact bilaterally. VIII - Hearing & vestibular intact bilaterally. X - Palate elevates symmetrically. XI - Chin turning & shoulder shrug intact bilaterally. XII - Tongue protrusion intact.  Motor Strength - The patient's strength was normal in all extremities and pronator drift was absent.  Bulk was normal and fasciculations were absent.   Motor Tone - Muscle tone was assessed at the neck and appendages and was normal.  Reflexes - The patient's reflexes were symmetrical in all extremities and he had no pathological reflexes.  Sensory - Light touch, temperature/pinprick were assessed and were symmetrical.    Coordination - The patient had normal movements in the hands with no ataxia or dysmetria.  Tremor was absent.  Gait and  Station - deferred.    Data Reviewed: Ct Head Wo Contrast  Result Date: 08/11/2019 CLINICAL DATA:  Left-sided facial droop with slurred speech. Left-sided weakness EXAM: CT HEAD WITHOUT CONTRAST TECHNIQUE: Contiguous axial images were obtained from the base of the skull through the vertex without intravenous contrast. COMPARISON:  None. FINDINGS: Brain: Ventricles are normal in size and configuration. There is a small cavum septum pellucidum, an anatomic variant. There is no intracranial mass, hemorrhage, extra-axial fluid collection, or midline shift. There is a focal infarct in the posterior aspect of the dentate nucleus on the right medially which may be recent. No other focal infarct evident. Vascular: No hyperdense vessel evident. There is calcification in each carotid siphon region. Skull: The bony calvarium appears intact. Sinuses/Orbits: There is mucosal thickening in several ethmoid air cells. Other visualized paranasal sinuses are clear. Orbits appear symmetric bilaterally. There is rightward deviation of the nasal septum. Other: Visualized mastoid air cells are clear. IMPRESSION: Age uncertain and potentially recent infarct in the posteromedial aspect of the dentate nucleus of the right cerebellum. No other parenchymal abnormality to suggest infarct noted. No mass or hemorrhage. There are foci of arterial vascular calcification. There is mucosal thickening in several ethmoid air cells. There is rightward deviation of the nasal septum. Electronically Signed   By: Lowella Grip III M.D.   On: 08/11/2019 11:08   Mr Angio Head Wo Contrast  Result Date: 08/12/2019 CLINICAL DATA:  Stroke follow-up EXAM: MRA HEAD WITHOUT CONTRAST TECHNIQUE: Angiographic images of the Circle of Willis were obtained using MRA technique without intravenous contrast. COMPARISON:  Brain MRI from yesterday FINDINGS: Anterior circulation: Atheromatous irregularity of cavernous carotids with up to 50% stenosis at the  right paraclinoid segment. Degree of right ACA hypoplasia with at least moderate narrowed appearance of right A1 and A2 segments. Mild atheromatous irregularity of medium size vessels. Negative for aneurysm. Posterior circulation: The vertebral and basilar arteries are smooth and widely patent. Dominant left PICA and right AICA. Moderate stenosis at the right P2 segment and high-grade stenosis with apparent flow gap at the upper branch right PCA. IMPRESSION: Age advanced intracranial atherosclerosis with affected sites described above. Electronically Signed   By: Monte Fantasia M.D.   On: 08/12/2019 04:56   Mr Angio Neck Wo Contrast  Result Date: 08/12/2019 CLINICAL DATA:  Stroke follow-up EXAM: MRA NECK WITHOUT CONTRAST TECHNIQUE: Angiographic images of the neck were obtained using MRA technique without intravenous contrast. COMPARISON:  None. FINDINGS: The arch was not covered in the field of view. There appears to  be 3 vessel branching with no evidence of great vessel stenoses. There is antegrade flow in the bilateral carotid and vertebral circulation. No significant stenosis, beading, or ulceration. There is mild narrowing at the proximal right ECA. IMPRESSION: Essentially negative neck MRA. Electronically Signed   By: Monte Fantasia M.D.   On: 08/12/2019 04:58   Mr Brain Wo Contrast  Result Date: 08/11/2019 CLINICAL DATA:  Left-sided weakness, facial droop, and slurred speech. History of hypertension and diabetes. EXAM: MRI HEAD WITHOUT CONTRAST TECHNIQUE: Multiplanar, multiecho pulse sequences of the brain and surrounding structures were obtained without intravenous contrast. COMPARISON:  Head CT 08/11/2019 FINDINGS: Brain: There is a 1 cm acute infarct medially in the right cerebellar hemisphere. Additionally, there are scattered punctate acute infarcts involving the left greater than right cerebral hemispheres, predominantly white matter as well as left thalamus. Scattered small foci of T2  hyperintensity elsewhere in the cerebral white matter bilaterally and brainstem/right cerebral peduncle are nonspecific though may reflect mild chronic small vessel ischemic disease given vascular risk factors. Chronic lacunar infarcts are present in the dorsal thalami bilaterally. The ventricles and sulci are within normal limits in size for age. A cavum septum pellucidum is incidentally noted. No intracranial hemorrhage, mass, midline shift, or extra-axial fluid collection is identified. Vascular: Major intracranial vascular flow voids are preserved. Skull and upper cervical spine: Unremarkable bone marrow signal. Sinuses/Orbits: Unremarkable orbits. Trace left mastoid fluid. Mild left ethmoid air cell mucosal thickening. Other: 2 cm lipoma in the scalp posterior to the right ear. IMPRESSION: 1. Small acute infarcts in the right cerebellum and both cerebral hemispheres. 2. Mild chronic small vessel ischemic disease. Electronically Signed   By: Logan Bores M.D.   On: 08/11/2019 15:58   US Renal  Result Date: 08/11/2019 CLINICAL DATA:  Acute kidney injury EXAM: RENAL / URINARY TRACT ULTRASOUND COMPLETE COMPARISON:  None. FINDINGS: Right Kidney: Renal measurements: 10.7 x 5.6 x 6.5 cm = volume: 202.2 mL . Echogenicity is increased. Renal cortical thickness is normal. No mass, perinephric fluid, or hydronephrosis visualized. No sonographically demonstrable calculus or ureterectasis. Left Kidney: Renal measurements: 11.3 x 7.4 x 4.4 cm = volume: 192.7 mL. Echogenicity is increased. Renal cortical thickness is normal. No mass, perinephric fluid, or hydronephrosis visualized. No sonographically demonstrable calculus or ureterectasis. Bladder: Appears normal for degree of bladder distention. Other: None IMPRESSION: Increased renal echogenicity, a finding indicative of medical renal disease. Renal cortical thickness normal. No obstructing focus on either side. Study otherwise unremarkable. Electronically Signed   By:  Lowella Grip III M.D.   On: 08/11/2019 14:36   Dg Chest Port 1 View  Result Date: 08/11/2019 CLINICAL DATA:  Stroke. EXAM: PORTABLE CHEST 1 VIEW COMPARISON:  None. FINDINGS: The heart size and mediastinal contours are within normal limits. Normal pulmonary vascularity. Small to moderate right and small left pleural effusions with adjacent bibasilar atelectasis. No pneumothorax. No acute osseous abnormality. IMPRESSION: 1. Small to moderate right and small left pleural effusions with adjacent bibasilar atelectasis. Electronically Signed   By: Titus Dubin M.D.   On: 08/11/2019 15:10   Vas [place] Lower Extremity Venous (dvt)  Result Date: 08/12/2019  Lower Venous Study Indications: Stroke.  Performing Technologist: Antonieta Pert RDMS, RVT  Examination Guidelines: A complete evaluation includes B-mode imaging, spectral Doppler, color Doppler, and power Doppler as needed of all accessible portions of each vessel. Bilateral testing is considered an integral part of a complete examination. Limited examinations for reoccurring indications may be performed as noted.  +---------+---------------+---------+-----------+----------+--------------+  RIGHT    CompressibilityPhasicitySpontaneityPropertiesThrombus Aging +---------+---------------+---------+-----------+----------+--------------+ CFV      Full           Yes      Yes                                 +---------+---------------+---------+-----------+----------+--------------+ SFJ      Full                                                        +---------+---------------+---------+-----------+----------+--------------+ FV Prox  Full                                                        +---------+---------------+---------+-----------+----------+--------------+ FV Mid   Full                                                        +---------+---------------+---------+-----------+----------+--------------+ FV DistalFull                                                         +---------+---------------+---------+-----------+----------+--------------+ PFV      Full                                                        +---------+---------------+---------+-----------+----------+--------------+ POP      Full           Yes      Yes                                 +---------+---------------+---------+-----------+----------+--------------+ PTV      Full                                                        +---------+---------------+---------+-----------+----------+--------------+ PERO     Full                                                        +---------+---------------+---------+-----------+----------+--------------+ GSV      Full                                                        +---------+---------------+---------+-----------+----------+--------------+  atherosclerotic changes throughout extremity.  +---------+---------------+---------+-----------+----------+--------------+ LEFT     CompressibilityPhasicitySpontaneityPropertiesThrombus Aging +---------+---------------+---------+-----------+----------+--------------+ CFV      Full           Yes      Yes                                 +---------+---------------+---------+-----------+----------+--------------+ SFJ      Full                                                        +---------+---------------+---------+-----------+----------+--------------+ FV Prox  Full                                                        +---------+---------------+---------+-----------+----------+--------------+ FV Mid   Full                                                        +---------+---------------+---------+-----------+----------+--------------+ FV DistalFull                                                        +---------+---------------+---------+-----------+----------+--------------+ PFV      Full                                                         +---------+---------------+---------+-----------+----------+--------------+ POP      Full           Yes      Yes                                 +---------+---------------+---------+-----------+----------+--------------+ PTV      Full                                                        +---------+---------------+---------+-----------+----------+--------------+ PERO     Full                                                        +---------+---------------+---------+-----------+----------+--------------+ GSV      Full                                                        +---------+---------------+---------+-----------+----------+--------------+  atherosclerotic changes throughout extremity.    Summary: Right: There is no evidence of deep vein thrombosis in the lower extremity. No cystic structure found in the popliteal fossa. Left: There is no evidence of deep vein thrombosis in the lower extremity. No cystic structure found in the popliteal fossa.  *See table(s) above for measurements and observations.    Preliminary      Assessment:  [name] is a 47 y.o. male with history of HTN, DB, tobacco abuse presenting to [facility] ED with confusion, slurred speech, drooling, L facial droop, L side weakness with L sided CP. He did not receive IV t-PA due as out of the window.   Stroke: R small cerebellar and bilateral MCA/PCA, right PCA, right thalamic punctate infarcts embolic secondary to unknown source  CT head age indeterminate R cerebellar infarct. Sinus dz. R septum deviation  MRI  Small R cerebellar and B cerebral punctate infarcts. Mild small vessel disease.   MRA head age advanced atherosclerosis, right ICA siphon 50% stenosis, right P2 moderate and P3 high-grade stenosis.  MRA neck Unremarkable   LE Doppler  No DVT  2D Echo EF 65 to 70%  TEE tomorrow at 0930  If TEE negative, a [place] electrophysiologist will consult and consider placement of an implantable loop recorder to evaluate for atrial fibrillation as etiology of stroke. This has been explained to patient/family by Dr. Erlinda Hong and he is agreeable. They plan to see tomorrow.  LDL pending   HgbA1c 11.6   Heparin 5000 units sq tid for VTE prophylaxis  No antithrombotic prior to admission, now on aspirin 325 daily and clopidogrel 75 mg daily.  Continue DAPT for 3 months and then aspirin alone given severe intracranial stenosis.  Therapy recommendations:  HH PT, no OT  Disposition:  Return home  Hypertensive Urgency   BP as high as 228/95  Stable on the high end  On amlodipine 2.5 . Permissive hypertension (OK if < 220/120) but gradually normalize in 3-5 days . Long-term BP goal 130-150 given severe intracranial stenosis  Hyperlipidemia  Home meds:  No statin  Now on lipitor 80  LDL pending, goal < 70  Continue statin at discharge  Diabetes type II, Uncontrolled  HgbA1c 11.6, goal < 7.0  CBGs  SSI  On Lantus  DB coordinator following  Close PCP follow-up for better DM control  CKD  Creatinine 2.72->2.6  Has outpatient follow-up with nephrology  Tobacco abuse  Current smoker  Smoking cessation counseling provided  Nicotine patch provided  Pt is willing to quit  Other Stroke Risk Factors    Other Active Problems  Leukocytosis WBC 11.3  Hypokalemia K 3.3-supplement  Hospital day # 1   Thank you for this consultation and allowing [place] to participate in the care of this patient.  We will follow  Rosalin Hawking, MD PhD Stroke Neurology 08/12/2019 6:57 PM  To contact Stroke Continuity provider, please refer to [email or web address] After hours, contact General Neurology

## 2019-08-12 NOTE — Progress Notes (Signed)
Bilateral lower extremity venous duplex complete. Please see CV Proc tab for preliminary results. Lita Mains- RDMS, RVT 12:55 PM  08/12/2019

## 2019-08-12 NOTE — TOC Initial Note (Signed)
Transition of Care [facility]) - Initial/Assessment Note    Patient Details  Name: Phillip Camacho MRN: [id number] Date of Birth: February 20, 1971  [facility]) CM/SW Contact:    Pollie Friar, RN Phone Number: 08/12/2019, 3:49 PM  Clinical Narrative:                 Recommendations are for outpatient therapy. Pts PCP:Dr [name] will not sign HH orders. Also he has a [employer] and no HH will accept. Pt is in agreement with outpatient therapy and so is MD. Pt states he will have transportation for appts.  TOC following.  Expected Discharge Plan: OP Rehab Barriers to Discharge: Continued Medical Work up   Patient Goals and CMS Choice     Choice offered to / list presented to : Patient  Expected Discharge Plan and Services Expected Discharge Plan: OP Rehab   Discharge Planning Services: CM Consult                                          Prior Living Arrangements/Services   Lives with:: Self Patient language and need for interpreter reviewed:: Yes(no needs) Do you feel safe going back to the place where you live?: Yes      Need for Family Participation in Patient Care: Yes (Comment) Care giver support system in place?: Yes (comment)(daughter is coming to stay with the patient)   Criminal Activity/Legal Involvement Pertinent to Current Situation/Hospitalization: No - Comment as needed  Activities of Daily Living Home Assistive Devices/Equipment: None ADL Screening (condition at time of admission) Patient's cognitive ability adequate to safely complete daily activities?: Yes Is the patient deaf or have difficulty hearing?: No Does the patient have difficulty seeing, even when wearing glasses/contacts?: No Does the patient have difficulty concentrating, remembering, or making decisions?: No Patient able to express need for assistance with ADLs?: Yes Does the patient have difficulty dressing or bathing?: Yes Independently performs ADLs?:  No Communication: Independent(speech slurred) Dressing (OT): Needs assistance Is this a change from baseline?: Change from baseline, expected to last >3 days Grooming: Needs assistance Is this a change from baseline?: Change from baseline, expected to last >3 days Feeding: Needs assistance Is this a change from baseline?: Change from baseline, expected to last >3 days Bathing: Needs assistance Is this a change from baseline?: Change from baseline, expected to last >3 days Toileting: Needs assistance Is this a change from baseline?: Change from baseline, expected to last >3days In/Out Bed: Needs assistance Is this a change from baseline?: Change from baseline, expected to last >3 days Walks in Home: Needs assistance Is this a change from baseline?: Change from baseline, expected to last >3 days Does the patient have difficulty walking or climbing stairs?: Yes(secondary to weakness) Weakness of Legs: Left Weakness of Arms/Hands: Left  Permission Sought/Granted                  Emotional Assessment Appearance:: Appears stated age Attitude/Demeanor/Rapport: Engaged Affect (typically observed): Accepting, Pleasant Orientation: : Oriented to Self, Oriented to Place, Oriented to  Time, Oriented to Situation   Psych Involvement: No (comment)  Admission diagnosis:  Hypokalemia [E87.6] Hyperglycemia [R73.9] AKI (acute kidney injury) ([place]) [N17.9] Acute CVA (cerebrovascular accident) ([place]) [I63.9] Cerebral infarction, unspecified mechanism ([place]) [I63.9] Hypertension, unspecified type [I10] Patient Active Problem List   Diagnosis Date Noted  . Acute CVA (cerebrovascular accident) ([place])  08/11/2019  . DM (diabetes mellitus) ([place]) 08/11/2019  . Essential hypertension 08/11/2019  . Tobacco abuse 08/11/2019   PCP:  Patient, No Pcp Per Pharmacy:   [facility], [place] [address] Phone: [phone] Fax:  [phone]     Social Determinants of Health (SDOH) Interventions    Readmission Risk Interventions No flowsheet data found.

## 2019-08-13 ENCOUNTER — Inpatient Hospital Stay (HOSPITAL_COMMUNITY): Payer: BLUE CROSS/BLUE SHIELD | Admitting: Anesthesiology

## 2019-08-13 ENCOUNTER — Inpatient Hospital Stay (HOSPITAL_COMMUNITY): Payer: BLUE CROSS/BLUE SHIELD

## 2019-08-13 ENCOUNTER — Encounter (HOSPITAL_COMMUNITY): Payer: Self-pay | Admitting: Physician Assistant

## 2019-08-13 ENCOUNTER — Encounter (HOSPITAL_COMMUNITY)
Admission: EM | Disposition: A | Discharge status: Home / Self Care | Payer: Self-pay | Source: Home / Self Care | Attending: Internal Medicine

## 2019-08-13 DIAGNOSIS — I639 Cerebral infarction, unspecified: Secondary | ICD-10-CM

## 2019-08-13 HISTORY — PX: BUBBLE STUDY: SHX6837

## 2019-08-13 HISTORY — PX: LOOP RECORDER INSERTION: EP1214

## 2019-08-13 HISTORY — PX: TEE WITHOUT CARDIOVERSION: SHX5443

## 2019-08-13 LAB — BASIC METABOLIC PANEL
Anion gap: 9 (ref 5–15)
BUN: 25 mg/dL — ABNORMAL HIGH (ref 6–20)
CO2: 21 mmol/L — ABNORMAL LOW (ref 22–32)
Calcium: 7.7 mg/dL — ABNORMAL LOW (ref 8.9–10.3)
Chloride: 107 mmol/L (ref 98–111)
Creatinine, Ser: 2.55 mg/dL — ABNORMAL HIGH (ref 0.61–1.24)
GFR calc Af Amer: 33 mL/min — ABNORMAL LOW (ref >60)
GFR calc non Af Amer: 29 mL/min — ABNORMAL LOW (ref >60)
Glucose, Bld: 244 mg/dL — ABNORMAL HIGH (ref 70–99)
Potassium: 2.8 mmol/L — ABNORMAL LOW (ref 3.5–5.1)
Sodium: 137 mmol/L (ref 135–145)

## 2019-08-13 LAB — GLUCOSE, CAPILLARY
Glucose-Capillary: 94 mg/dL (ref 70–99)
Glucose-Capillary: 144 mg/dL — ABNORMAL HIGH (ref 70–99)
Glucose-Capillary: 287 mg/dL — ABNORMAL HIGH (ref 70–99)
Glucose-Capillary: 148 mg/dL — ABNORMAL HIGH (ref 70–99)

## 2019-08-13 LAB — LIPID PANEL
Cholesterol: 312 mg/dL — ABNORMAL HIGH (ref 0–200)
HDL: 39 mg/dL — ABNORMAL LOW (ref >40)
LDL Cholesterol: 248 mg/dL — ABNORMAL HIGH (ref 0–99)
Total CHOL/HDL Ratio: 8 RATIO
Triglycerides: 125 mg/dL (ref <150)
VLDL: 25 mg/dL (ref 0–40)

## 2019-08-13 LAB — PROTEIN S ACTIVITY: Protein S Activity: 118 % (ref 63–140)

## 2019-08-13 LAB — LUPUS ANTICOAGULANT PANEL
DRVVT: 46.4 s (ref 0.0–47.0)
PTT Lupus Anticoagulant: 44.2 s (ref 0.0–51.9)

## 2019-08-13 LAB — BETA-2-GLYCOPROTEIN I ABS, IGG/M/A
Beta-2 Glyco I IgG: <9 GPI IgG units (ref 0–20)
Beta-2-Glycoprotein I IgA: <9 GPI IgA units (ref 0–25)
Beta-2-Glycoprotein I IgM: <9 GPI IgM units (ref 0–32)

## 2019-08-13 LAB — PROTEIN C ACTIVITY: Protein C Activity: 139 % (ref 73–180)

## 2019-08-13 LAB — HOMOCYSTEINE: Homocysteine: 12.2 umol/L (ref 0.0–14.5)

## 2019-08-13 LAB — PROTEIN S, TOTAL: Protein S Ag, Total: 162 % — ABNORMAL HIGH (ref 60–150)

## 2019-08-13 SURGERY — ECHOCARDIOGRAM, TRANSESOPHAGEAL
Anesthesia: Monitor Anesthesia Care

## 2019-08-13 SURGERY — LOOP RECORDER INSERTION

## 2019-08-13 MED ORDER — AMLODIPINE BESYLATE 5 MG PO TABS
5.0000 mg | ORAL_TABLET | Freq: Every day | ORAL | Status: DC
  Administered 2019-08-13: 08:00:00 5 mg via ORAL
  Filled 2019-08-13: qty 1

## 2019-08-13 MED ORDER — PROPOFOL 500 MG/50ML IV EMUL
INTRAVENOUS | Status: AC
  Filled 2019-08-13: qty 100

## 2019-08-13 MED ORDER — POTASSIUM CHLORIDE CRYS ER 20 MEQ PO TBCR
40.0000 meq | EXTENDED_RELEASE_TABLET | Freq: Two times a day (BID) | ORAL | Status: AC
  Administered 2019-08-13 (×2): 40 meq via ORAL
  Filled 2019-08-13 (×2): qty 2

## 2019-08-13 MED ORDER — PROPOFOL 500 MG/50ML IV EMUL
INTRAVENOUS | Status: DC | PRN
  Administered 2019-08-13: 100 ug/kg/min via INTRAVENOUS

## 2019-08-13 MED ORDER — ONDANSETRON HCL 4 MG/2ML IJ SOLN
INTRAMUSCULAR | Status: DC | PRN
  Administered 2019-08-13: 4 mg via INTRAVENOUS

## 2019-08-13 MED ORDER — LIDOCAINE-EPINEPHRINE 1 %-1:100000 IJ SOLN
INTRAMUSCULAR | Status: DC | PRN
  Administered 2019-08-13: 10 mL

## 2019-08-13 MED ORDER — PROPOFOL 10 MG/ML IV BOLUS
INTRAVENOUS | Status: DC | PRN
  Administered 2019-08-13 (×2): 50 mg via INTRAVENOUS

## 2019-08-13 MED ORDER — LIDOCAINE-EPINEPHRINE 1 %-1:100000 IJ SOLN
INTRAMUSCULAR | Status: AC
  Filled 2019-08-13: qty 1

## 2019-08-13 MED ORDER — BUTAMBEN-TETRACAINE-BENZOCAINE 2-2-14 % EX AERO
INHALATION_SPRAY | CUTANEOUS | Status: DC | PRN
  Administered 2019-08-13: 2 via TOPICAL

## 2019-08-13 MED ORDER — LABETALOL HCL 5 MG/ML IV SOLN
5.0000 mg | INTRAVENOUS | Status: DC | PRN
  Administered 2019-08-13 (×2): 5 mg via INTRAVENOUS
  Filled 2019-08-13 (×4): qty 4

## 2019-08-13 MED ORDER — LIDOCAINE 2% (20 MG/ML) 5 ML SYRINGE
INTRAMUSCULAR | Status: DC | PRN
  Administered 2019-08-13: 60 mg via INTRAVENOUS

## 2019-08-13 MED ORDER — SODIUM CHLORIDE 0.9 % IV SOLN
INTRAVENOUS | Status: DC | PRN
  Administered 2019-08-13 (×2): via INTRAVENOUS

## 2019-08-13 MED ORDER — AMLODIPINE BESYLATE 5 MG PO TABS
5.0000 mg | ORAL_TABLET | Freq: Once | ORAL | Status: AC
  Administered 2019-08-13: 13:00:00 5 mg via ORAL
  Filled 2019-08-13: qty 1

## 2019-08-13 MED ORDER — AMLODIPINE BESYLATE 10 MG PO TABS: 10.0000 mg | ORAL_TABLET | Freq: Every day | ORAL | Status: DC

## 2019-08-13 SURGICAL SUPPLY — 2 items
MONITOR REVEAL LINQ II (Prosthesis & Implant Heart) ×2 IMPLANT
PACK LOOP INSERTION (CUSTOM PROCEDURE TRAY) ×3 IMPLANT

## 2019-08-13 NOTE — Progress Notes (Signed)
SLP Cancellation Note  Patient Details Name: Phillip Camacho MRN: [id number] DOB: 20-Jun-1971   Cancelled treatment:       Reason Eval/Treat Not Completed: Patient at procedure or test/unavailable. Unable to complete cog/com evaluation at this time, as pt is currently off unit for TEE. Will continue efforts.  Phillip Camacho, [facility], Benjamin Perez Speech Language Pathologist Office: [phone] Pager: [phone]  [name] 08/13/2019, 9:50 AM

## 2019-08-13 NOTE — Interval H&P Note (Signed)
History and Physical Interval Note:  08/13/2019 8:10 AM  Phillip Camacho  has presented today for surgery, with the diagnosis of stroke.  The various methods of treatment have been discussed with the patient and family. After consideration of risks, benefits and other options for treatment, the patient has consented to  Procedure(s) with comments: TRANSESOPHAGEAL ECHOCARDIOGRAM (TEE) (N/A) - PATIENT NEEDS LOOP as a surgical intervention.  The patient's history has been reviewed, patient examined, no change in status, stable for surgery.  I have reviewed the patient's chart and labs.  Questions were answered to the patient's satisfaction.     Asiya Cutbirth

## 2019-08-13 NOTE — Anesthesia Procedure Notes (Signed)
Procedure Name: MAC Date/Time: 08/13/2019 9:45 AM Performed by: Teressa Lower., CRNA Pre-anesthesia Checklist: Patient identified, Emergency Drugs available, Suction available, Patient being monitored and Timeout performed Patient Re-evaluated:Patient Re-evaluated prior to induction Oxygen Delivery Method: Nasal cannula

## 2019-08-13 NOTE — Op Note (Signed)
INDICATIONS: cryptogenic stroke  PROCEDURE:   Informed consent was obtained prior to the procedure. The risks, benefits and alternatives for the procedure were discussed and the patient comprehended these risks.  Risks include, but are not limited to, cough, sore throat, vomiting, nausea, somnolence, esophageal and stomach trauma or perforation, bleeding, low blood pressure, aspiration, pneumonia, infection, trauma to the teeth and death.    After a procedural time-out, the oropharynx was anesthetized with 20% benzocaine spray.   During this procedure the patient was administered IV propofol, Dr. Christella Hartigan, Anesthesiology.  The transesophageal probe was inserted in the esophagus and stomach without difficulty and multiple views were obtained.  The patient was kept under observation until the patient left the procedure room.  The patient left the procedure room in stable condition.   Agitated microbubble saline contrast was administered.  COMPLICATIONS:    There were no immediate complications.  FINDINGS:  Normal cardiac TEE. No intracardiac thrombus, mass or shunt. There is moderate thickness and ulcerated plaque in the proximal aortic arch, but no mobile components are seen.  RECOMMENDATIONS:     proceed with ILR.  Time Spent Directly with the Patient:  30 minutes   Da Authement 08/13/2019, 9:51 AM

## 2019-08-13 NOTE — Consult Note (Addendum)
ELECTROPHYSIOLOGY CONSULT NOTE  Patient ID: Phillip Camacho MRN: [id number], DOB/AGE: 48-Oct-1972   Admit date: 08/11/2019 Date of Consult: 08/13/2019  Primary Physician: Patient, No Pcp Per Primary Cardiologist: none Reason for Consultation: Cryptogenic stroke - recommendations regarding Implantable Loop Recorder, requested by Dr. Erlinda Hong   NOTE: the patient is [place].  He speaks [place] and when asked reports comfortable with English and declined translation service.  He answers all orientation answers correctly/accurately.  Asked appropriate questions regarding loop implant/our discussion.   History of Present Illness Arshia Egnew Emrick was admitted on 08/11/2019 with confusion, slurred speech, L facial droop, and L side weakness. Found in hypertensive crisis (228/95), hyperglycemic (446), aute on CKD (22/2.72, unclear baseline), and with stroke.  He first developed symptoms while at home.  PMHx includes: HTN, DM, ongoing smoker, CKD was pending nephrology evaluation, out patient noncompliance with meds and follow up  Neurology has noted R small cerebellar and bilateral MCA/PCA, right PCA, right thalamic punctate infarcts embolic secondary to unknown source.  he has undergone workup for stroke including echocardiogram and carotid angio.  The patient has been monitored on telemetry which has demonstrated sinus rhythm with no arrhythmias.  Inpatient stroke work-up is to be completed with a TEE.     Echocardiogram this admission demonstrated  IMPRESSIONS 1. Left ventricular ejection fraction, by visual estimation, is 65 to 70%. The left ventricle has normal function. Normal left ventricular size. There is no left ventricular hypertrophy.  2. Elevated left ventricular end-diastolic pressure.  3. Left ventricular diastolic Doppler parameters are consistent with pseudonormalization pattern of LV diastolic filling.  4. Global right ventricle has normal systolic function.The right ventricular size  is normal. No increase in right ventricular wall thickness.  5. Left atrial size was mildly dilated.  6. Right atrial size was normal.  7. The mitral valve is normal in structure. Mild mitral valve regurgitation. No evidence of mitral stenosis.  8. The tricuspid valve is normal in structure. Tricuspid valve regurgitation is mild.  9. The aortic valve is tricuspid Aortic valve regurgitation was not visualized by color flow Doppler. Mild aortic valve sclerosis without stenosis. 10. The pulmonic valve was normal in structure. Pulmonic valve regurgitation is trivial by color flow Doppler. 11. Mildly elevated pulmonary artery systolic pressure. 12. The inferior vena cava is normal in size with greater than 50% respiratory variability, suggesting right atrial pressure of 3 mmHg.    Lab work is reviewed.  Prior to admission, the patient denies chest pain, shortness of breath, dizziness, palpitations, or syncope.  They are recovering from their stroke with plans to home at discharge.   Past Medical History:  Diagnosis Date   Diabetes mellitus without complication [facility])      Surgical History: History reviewed. No pertinent surgical history.   Medications Prior to Admission  Medication Sig Dispense Refill Last Dose   furosemide (LASIX) 80 MG tablet Take 1 tablet by mouth daily as needed for fluid.   Unknown   ibuprofen (ADVIL,MOTRIN) 600 MG tablet Take 1 tablet (600 mg total) by mouth every 8 (eight) hours as needed. (Patient not taking: Reported on 08/11/2019) 15 tablet 0 Not Taking at Unknown time   metFORMIN (GLUCOPHAGE) 500 MG tablet Take 1 tablet (500 mg total) by mouth 2 (two) times daily with a meal. 60 tablet 11     Inpatient Medications:   amLODipine  5 mg Oral Daily   aspirin  300 mg Rectal Daily   Or   aspirin  325  mg Oral Daily   atorvastatin  80 mg Oral q1800   clopidogrel  75 mg Oral Daily   heparin  5,000 Units Subcutaneous Q8H   insulin aspart  0-5 Units  Subcutaneous QHS   insulin aspart  0-9 Units Subcutaneous TID WC   insulin aspart  4 Units Subcutaneous TID WC   insulin glargine  12 Units Subcutaneous Q24H   nicotine  21 mg Transdermal Daily    Allergies: No Known Allergies  Social History   Socioeconomic History   Marital status: Single    Spouse name: Not on file   Number of children: Not on file   Years of education: Not on file   Highest education level: Not on file  Occupational History   Not on file  Social Needs   Financial resource strain: Not on file   Food insecurity    Worry: Not on file    Inability: Not on file   Transportation needs    Medical: Not on file    Non-medical: Not on file  Tobacco Use   Smoking status: Current Every Day Smoker   Smokeless tobacco: Never Used  Substance and Sexual Activity   Alcohol use: No   Drug use: Not on file   Sexual activity: Not on file  Lifestyle   Physical activity    Days per week: Not on file    Minutes per session: Not on file   Stress: Not on file  Relationships   Social connections    Talks on phone: Not on file    Gets together: Not on file    Attends religious service: Not on file    Active member of club or organization: Not on file    Attends meetings of clubs or organizations: Not on file    Relationship status: Not on file   Intimate partner violence    Fear of current or ex partner: Not on file    Emotionally abused: Not on file    Physically abused: Not on file    Forced sexual activity: Not on file  Other Topics Concern   Not on file  Social History Narrative   Not on file     Family History  Problem Relation Age of Onset   Other Neg Hx        patient denies any particular family medical history      Review of Systems: All other systems reviewed and are otherwise negative except as noted above.  Physical Exam: Vitals:   08/12/19 2309 08/13/19 0405 08/13/19 0606 08/13/19 0707  BP: (!) 170/68 (!) 176/83 (!)  175/80 (!) 181/78  Pulse: 86 82 86 80  Resp: 14 13 11 15   Temp: 99 F (37.2 C) 98.9 F (37.2 C)    TempSrc: Oral Oral    SpO2: 100% 100% 100% 99%  Weight:  65.8 kg      GEN- The patient is well appearing, alert and oriented x 3 today.   Head- normocephalic, atraumatic Eyes-  Sclera clear, conjunctiva pink Ears- hearing intact Oropharynx- clear Neck- supple Lungs- CTA b/l, normal work of breathing Heart-RRR, no murmurs, rubs or gallops  GI- soft, NT, ND Extremities- no clubbing, cyanosis, or edema MS- no significant deformity or atrophy Skin- no rash or lesion Psych- euthymic mood, full affect   Labs:   Lab Results  Component Value Date   WBC 11.3 (H) 08/11/2019   HGB 14.6 08/11/2019   HCT 43.0 08/11/2019   MCV 87.1 08/11/2019  PLT 494 (H) 08/11/2019    Recent Labs  Lab 08/11/19 1048 08/11/19 1054  NA 130* 133*  K 3.2* 3.3*  CL 97* 96*  CO2 27  --   BUN 22* 22*  CREATININE 2.72* 2.60*  CALCIUM 8.1*  --   PROT 5.7*  --   BILITOT 0.3  --   ALKPHOS 112  --   ALT 10  --   AST 7*  --   GLUCOSE 446* 440*   No results found for: CKTOTAL, CKMB, CKMBINDEX, TROPONINI Lab Results  Component Value Date   CHOL 312 (H) 08/13/2019   Lab Results  Component Value Date   HDL 39 (L) 08/13/2019   Lab Results  Component Value Date   LDLCALC 248 (H) 08/13/2019   Lab Results  Component Value Date   TRIG 125 08/13/2019   Lab Results  Component Value Date   CHOLHDL 8.0 08/13/2019   No results found for: LDLDIRECT  Lab Results  Component Value Date   DDIMER 1.70 (H) 08/12/2019     Radiology/Studies:   Ct Head Wo Contrast Result Date: 08/11/2019 CLINICAL DATA:  Left-sided facial droop with slurred speech. Left-sided weakness EXAM: CT HEAD WITHOUT CONTRAST TECHNIQUE: Contiguous axial images were obtained from the base of the skull through the vertex without intravenous contrast. COMPARISON:  None. FINDINGS: Brain: Ventricles are normal in size and  configuration. There is a small cavum septum pellucidum, an anatomic variant. There is no intracranial mass, hemorrhage, extra-axial fluid collection, or midline shift. There is a focal infarct in the posterior aspect of the dentate nucleus on the right medially which may be recent. No other focal infarct evident. Vascular: No hyperdense vessel evident. There is calcification in each carotid siphon region. Skull: The bony calvarium appears intact. Sinuses/Orbits: There is mucosal thickening in several ethmoid air cells. Other visualized paranasal sinuses are clear. Orbits appear symmetric bilaterally. There is rightward deviation of the nasal septum. Other: Visualized mastoid air cells are clear. IMPRESSION: Age uncertain and potentially recent infarct in the posteromedial aspect of the dentate nucleus of the right cerebellum. No other parenchymal abnormality to suggest infarct noted. No mass or hemorrhage. There are foci of arterial vascular calcification. There is mucosal thickening in several ethmoid air cells. There is rightward deviation of the nasal septum. Electronically Signed   By: Lowella Grip III M.D.   On: 08/11/2019 11:08     Mr Angio Head Wo Contrast Result Date: 08/12/2019 CLINICAL DATA:  Stroke follow-up EXAM: MRA HEAD WITHOUT CONTRAST TECHNIQUE: Angiographic images of the Circle of Willis were obtained using MRA technique without intravenous contrast. COMPARISON:  Brain MRI from yesterday FINDINGS: Anterior circulation: Atheromatous irregularity of cavernous carotids with up to 50% stenosis at the right paraclinoid segment. Degree of right ACA hypoplasia with at least moderate narrowed appearance of right A1 and A2 segments. Mild atheromatous irregularity of medium size vessels. Negative for aneurysm. Posterior circulation: The vertebral and basilar arteries are smooth and widely patent. Dominant left PICA and right AICA. Moderate stenosis at the right P2 segment and high-grade stenosis  with apparent flow gap at the upper branch right PCA. IMPRESSION: Age advanced intracranial atherosclerosis with affected sites described above. Electronically Signed   By: Monte Fantasia M.D.   On: 08/12/2019 04:56    Mr Angio Neck Wo Contrast Result Date: 08/12/2019 CLINICAL DATA:  Stroke follow-up EXAM: MRA NECK WITHOUT CONTRAST TECHNIQUE: Angiographic images of the neck were obtained using MRA technique without intravenous contrast. COMPARISON:  None.  FINDINGS: The arch was not covered in the field of view. There appears to be 3 vessel branching with no evidence of great vessel stenoses. There is antegrade flow in the bilateral carotid and vertebral circulation. No significant stenosis, beading, or ulceration. There is mild narrowing at the proximal right ECA. IMPRESSION: Essentially negative neck MRA. Electronically Signed   By: Monte Fantasia M.D.   On: 08/12/2019 04:58     Mr Brain Wo Contrast Result Date: 08/11/2019 CLINICAL DATA:  Left-sided weakness, facial droop, and slurred speech. History of hypertension and diabetes. EXAM: MRI HEAD WITHOUT CONTRAST TECHNIQUE: Multiplanar, multiecho pulse sequences of the brain and surrounding structures were obtained without intravenous contrast. COMPARISON:  Head CT 08/11/2019 FINDINGS: Brain: There is a 1 cm acute infarct medially in the right cerebellar hemisphere. Additionally, there are scattered punctate acute infarcts involving the left greater than right cerebral hemispheres, predominantly white matter as well as left thalamus. Scattered small foci of T2 hyperintensity elsewhere in the cerebral white matter bilaterally and brainstem/right cerebral peduncle are nonspecific though may reflect mild chronic small vessel ischemic disease given vascular risk factors. Chronic lacunar infarcts are present in the dorsal thalami bilaterally. The ventricles and sulci are within normal limits in size for age. A cavum septum pellucidum is incidentally noted. No  intracranial hemorrhage, mass, midline shift, or extra-axial fluid collection is identified. Vascular: Major intracranial vascular flow voids are preserved. Skull and upper cervical spine: Unremarkable bone marrow signal. Sinuses/Orbits: Unremarkable orbits. Trace left mastoid fluid. Mild left ethmoid air cell mucosal thickening. Other: 2 cm lipoma in the scalp posterior to the right ear. IMPRESSION: 1. Small acute infarcts in the right cerebellum and both cerebral hemispheres. 2. Mild chronic small vessel ischemic disease. Electronically Signed   By: Logan Bores M.D.   On: 08/11/2019 15:58     US Renal Result Date: 08/11/2019 CLINICAL DATA:  Acute kidney injury EXAM: RENAL / URINARY TRACT ULTRASOUND COMPLETE COMPARISON:  None. FINDINGS: Right Kidney: Renal measurements: 10.7 x 5.6 x 6.5 cm = volume: 202.2 mL . Echogenicity is increased. Renal cortical thickness is normal. No mass, perinephric fluid, or hydronephrosis visualized. No sonographically demonstrable calculus or ureterectasis. Left Kidney: Renal measurements: 11.3 x 7.4 x 4.4 cm = volume: 192.7 mL. Echogenicity is increased. Renal cortical thickness is normal. No mass, perinephric fluid, or hydronephrosis visualized. No sonographically demonstrable calculus or ureterectasis. Bladder: Appears normal for degree of bladder distention. Other: None IMPRESSION: Increased renal echogenicity, a finding indicative of medical renal disease. Renal cortical thickness normal. No obstructing focus on either side. Study otherwise unremarkable. Electronically Signed   By: Lowella Grip III M.D.   On: 08/11/2019 14:36     Dg Chest Port 1 View Result Date: 08/11/2019 CLINICAL DATA:  Stroke. EXAM: PORTABLE CHEST 1 VIEW COMPARISON:  None. FINDINGS: The heart size and mediastinal contours are within normal limits. Normal pulmonary vascularity. Small to moderate right and small left pleural effusions with adjacent bibasilar atelectasis. No pneumothorax. No  acute osseous abnormality. IMPRESSION: 1. Small to moderate right and small left pleural effusions with adjacent bibasilar atelectasis. Electronically Signed   By: Titus Dubin M.D.   On: 08/11/2019 15:10     Vas [place] Lower Extremity Venous (dvt) Result Date: 08/12/2019  Lower Venous Study Indications: Stroke.  Performing Technologist: Antonieta Pert RDMS, RVT  Examination Guidelines: A complete evaluation includes B-mode imaging, spectral Doppler, color Doppler, and power Doppler as needed of all accessible portions of each vessel. Bilateral testing is considered an  integral part of a complete examination. Limited examinations for reoccurring indications may be performed as noted. Summary: Right: There is no evidence of deep vein thrombosis in the lower extremity. No cystic structure found in the popliteal fossa. Left: There is no evidence of deep vein thrombosis in the lower extremity. No cystic structure found in the popliteal fossa.  *See table(s) above for measurements and observations. Electronically signed by Servando Snare MD on 08/12/2019 at 3:01:57 PM.    Final     12-lead ECG SR All prior EKG's in EPIC reviewed with no documented atrial fibrillation  Telemetry SR  Assessment and Plan:  1. Cryptogenic stroke The patient presents with cryptogenic stroke.  The patient has a TEE planned for this AM.  I spoke at length with the patient about monitoring for afib with either a 30 day event monitor or an implantable loop recorder.  Risks, benefits, and alteratives to implantable loop recorder were discussed with the patient today.   At this time, the patient is very clear in his decision to proceed with implantable loop recorder.   I discussed with the patient importance of stopping smoking, as well as management of his HTN, DM, and kidney disease as all parts of his care and attempts at preventing another stroke, discussed importance of seeing his doctors and medical compliance.  He states  understanding   Wound care was reviewed with the patient (keep incision clean and dry for 3 days).  Wound check will be scheduled for the patient  Please call with questions.   Renee Dyane Dustman, PA-C 08/13/2019  EP attending  Patient seen and examined.  Agree with the findings as noted above.  The patient has experienced a cryptogenic stroke.  I have discussed the treatment options in detail with the patient.  The risks, goals, benefits, and expectations of insertion of an implantable loop recorder were reviewed.  He wishes to proceed.  If his TEE demonstrates no etiology for his stroke, we will plan to proceed with implantable loop recorder.  I have attempted to call his family as he has requested but his home number had no answer.  Cristopher Peru, MD

## 2019-08-13 NOTE — Progress Notes (Signed)
Physical Therapy Treatment Patient Details Name: Phillip Camacho MRN: [id number] DOB: 23-Feb-1971 Today's Date: 08/13/2019    History of Present Illness Phillip Camacho is a 48 y.o. male with medical history significant for hypertension, diabetes, tobacco abuse, presents to the ED after son found patient to be very confused, slurred speech, drooling, left-sided facial droop, left-sided weakness, with some left-sided chest pain this a.m.     PT Comments    Pt able to speak clearer today and was able to hold appropriate conversation. Pt with some veering and instability initially with ambulation however improved with distance. Pt with impaired L SLS and tandem walking. Pt improved from yesterday but remains at increased falls risk and would require 24/7 initially for safe transition home.   Follow Up Recommendations  Outpatient PT;Supervision/Assistance - 24 hour     Equipment Recommendations  None recommended by PT    Recommendations for Other Services       Precautions / Restrictions Precautions Precautions: Fall Precaution Comments: limited english Restrictions Weight Bearing Restrictions: No    Mobility  Bed Mobility Overal bed mobility: Modified Independent             General bed mobility comments: brought self to EOB, HOB elevated  Transfers Overall transfer level: Needs assistance Equipment used: None Transfers: Sit to/from Stand Sit to Stand: Min guard         General transfer comment: min guard for balance, pt mildly unsteady with noted initial crossover step reaching for foot of bed  Ambulation/Gait Ambulation/Gait assistance: Min assist Gait Distance (Feet): 300 Feet Assistive device: None Gait Pattern/deviations: Step-through pattern;Decreased stride length;Staggering left Gait velocity: slow Gait velocity interpretation: 1.31 - 2.62 ft/sec, indicative of limited community ambulator General Gait Details: pt with decreased L step height, pt with noted  vearing to the L. pt c/o "I was so tired last night" Pt improved with stability t/o amb session   Stairs             Wheelchair Mobility    Modified Rankin (Stroke Patients Only) Modified Rankin (Stroke Patients Only) Pre-Morbid Rankin Score: No significant disability Modified Rankin: Slight disability     Balance Overall balance assessment: Needs assistance                             High Level Balance Comments: pt able to tandem amb 5' prior to LOB, x 3 trials. Pt able to maintian R SLS x 10 sec however unable to maintain L SLS x 5 trials without modA            Cognition Arousal/Alertness: Awake/alert Behavior During Therapy: WFL for tasks assessed/performed Overall Cognitive Status: Within Functional Limits for tasks assessed                                 General Comments: pt following commands well and holding appropriate conversation      Exercises      General Comments General comments (skin integrity, edema, etc.): multiple discoloration spots on bilat LE, pt with noted high BP [id number] systolic      Pertinent Vitals/Pain Pain Assessment: No/denies pain    Home Living                      Prior Function            PT Goals (current goals  can now be found in the care plan section) Progress towards PT goals: Progressing toward goals    Frequency    Min 4X/week      PT Plan Discharge plan needs to be updated    Co-evaluation              AM-PAC PT "6 Clicks" Mobility   Outcome Measure  Help needed turning from your back to your side while in a flat bed without using bedrails?: None Help needed moving from lying on your back to sitting on the side of a flat bed without using bedrails?: None Help needed moving to and from a bed to a chair (including a wheelchair)?: A Little Help needed standing up from a chair using your arms (e.g., wheelchair or bedside chair)?: A Little Help needed to walk in  hospital room?: A Little Help needed climbing 3-5 steps with a railing? : A Little 6 Click Score: 20    End of Session Equipment Utilized During Treatment: Gait belt Activity Tolerance: Patient tolerated treatment well Patient left: in bed;with call bell/phone within reach;with bed alarm set(leaving for TEE) Nurse Communication: Mobility status PT Visit Diagnosis: Unsteadiness on feet (R26.81);Difficulty in walking, not elsewhere classified (R26.2)     Time: [id number] PT Time Calculation (min) (ACUTE ONLY): 21 min  Charges:  $Gait Training: 8-22 mins                     Kittie Plater, PT, DPT Acute Rehabilitation Services Pager #: [phone] Office #: [phone]    Phillip Camacho 08/13/2019, 10:26 AM

## 2019-08-13 NOTE — Anesthesia Postprocedure Evaluation (Signed)
Anesthesia Post Note  Patient: Phillip Camacho  Procedure(s) Performed: TRANSESOPHAGEAL ECHOCARDIOGRAM (TEE) (N/A ) BUBBLE STUDY     Patient location during evaluation: Endoscopy Anesthesia Type: MAC Level of consciousness: awake and alert Pain management: pain level controlled Vital Signs Assessment: post-procedure vital signs reviewed and stable Respiratory status: spontaneous breathing, nonlabored ventilation and respiratory function stable Cardiovascular status: blood pressure returned to baseline and stable Postop Assessment: no apparent nausea or vomiting Anesthetic complications: no    Last Vitals:  Vitals:   08/13/19 0956 08/13/19 1005  BP: 137/65 (!) 143/74  Pulse: 77 81  Resp: (!) 22 (!) 23  Temp: (!) 36.3 C   SpO2: 100% 97%    Last Pain:  Vitals:   08/13/19 0956  TempSrc:   PainSc: 0-No pain                 Lidia Collum

## 2019-08-13 NOTE — Anesthesia Preprocedure Evaluation (Signed)
Anesthesia Evaluation  Patient identified by MRN, date of birth, ID band Patient awake    Reviewed: Allergy & Precautions, NPO status , Patient's Chart, lab work & pertinent test results  History of Anesthesia Complications Negative for: history of anesthetic complications  Airway Mallampati: II  TM Distance: >3 FB Neck ROM: Full    Dental  (+) Upper Dentures   Pulmonary Current Smoker and Patient abstained from smoking.,    Pulmonary exam normal        Cardiovascular hypertension, Normal cardiovascular exam     Neuro/Psych MRA head: age advanced atherosclerosis, right ICA siphon 50% stenosis, right P2 moderate and P3 high-grade stenosis. CVA (R small cerebellar and bilateral MCA/PCA, right PCA, right thalamic punctate infarcts embolic secondary to unknown source) negative psych ROS   GI/Hepatic negative GI ROS, Neg liver ROS,   Endo/Other  diabetes, Poorly Controlled  Renal/GU Renal InsufficiencyRenal disease  negative genitourinary   Musculoskeletal negative musculoskeletal ROS (+)   Abdominal   Peds  Hematology negative hematology ROS (+)   Anesthesia Other Findings   Reproductive/Obstetrics                             Anesthesia Physical Anesthesia Plan  ASA: III  Anesthesia Plan: MAC   Post-op Pain Management:    Induction: Intravenous  PONV Risk Score and Plan: 0 and Propofol infusion, TIVA and Treatment may vary due to age or medical condition  Airway Management Planned: Natural Airway, Nasal Cannula and Simple Face Mask  Additional Equipment: None  Intra-op Plan:   Post-operative Plan:   Informed Consent: I have reviewed the patients History and Physical, chart, labs and discussed the procedure including the risks, benefits and alternatives for the proposed anesthesia with the patient or authorized representative who has indicated his/her understanding and acceptance.        Plan Discussed with:   Anesthesia Plan Comments:         Anesthesia Quick Evaluation

## 2019-08-13 NOTE — Progress Notes (Signed)
SLP Consult Note  Patient Details Name: Phillip Camacho MRN: [id number] DOB: 1971-01-08   Reason Eval/Treat Not Completed: Fatigue/lethargy limiting ability to participate. SLE attempted, however, pt currently quite sleepy and speech is difficult to understand. No family present at this time. Will continue efforts.  Phillip Camacho, [facility], [place] Speech Language Pathologist Office: [phone] Pager: [phone]  Phillip Camacho 08/13/2019, 2:27 PM

## 2019-08-13 NOTE — Discharge Instructions (Signed)
Post implant site/wound care instructions °Keep incision clean and dry for 3 days. °You can remove outer dressing tomorrow. °Leave steri-strips (little pieces of tape) on until seen in the office for wound check appointment. °Call the office ([phone]) for redness, drainage, swelling, or fever. ° °

## 2019-08-13 NOTE — Transfer of Care (Signed)
Immediate Anesthesia Transfer of Care Note  Patient: Phillip Camacho  Procedure(s) Performed: TRANSESOPHAGEAL ECHOCARDIOGRAM (TEE) (N/A ) BUBBLE STUDY  Patient Location: Endoscopy Unit  Anesthesia Type:MAC  Level of Consciousness: awake, alert  and oriented  Airway & Oxygen Therapy: Patient Spontanous Breathing and Patient connected to nasal cannula oxygen  Post-op Assessment: Report given to RN and Post -op Vital signs reviewed and stable  Post vital signs: Reviewed and stable  Last Vitals:  Vitals Value Taken Time  BP    Temp    Pulse 74 08/13/19 0955  Resp 23 08/13/19 0955  SpO2 99 % 08/13/19 0955  Vitals shown include unvalidated device data.  Last Pain:  Vitals:   08/13/19 0845  TempSrc: Oral  PainSc: 0-No pain         Complications: No apparent anesthesia complications

## 2019-08-13 NOTE — Progress Notes (Addendum)
PROGRESS NOTE  Phillip Camacho [id number] DOB: [birth year] DOA: 08/11/2019 PCP: Patient, No Pcp Per  HPI/Recap of past 24 hours: Phillip Camacho is a 48 y.o. male with medical history significant for hypertension, diabetes, tobacco abuse, presents to the ED after son found patient to be very confused, slurred speech, drooling, left-sided facial droop, left-sided weakness, with some left-sided chest pain this a.m. Son last saw patient well around 8 PM on 08/10/2019.  On initial presentation, patient was noted to be altered, with significant slurred speech.  By my interview, patient was more awake, able to answer simple questions in [place], son at bedside.  Patient complains of reproducible left-sided chest pain, reports has been ongoing for about a couple of weeks, nothing makes it better or worse.  Denies any associated nausea/vomiting, shortness of breath, abdominal pain, dysuria, cough/sore throat.  Patient has not seen a doctor in years.  Patient was scheduled to see nephrology at [facility] next month.  ED Course: Afebrile, noted to be in hypertensive crisis with BP 228/95, saturating well on room air, labs showed glucose of 446, normal bicarb/anion gap, creatinine of 2.72, BNP 591.6, troponin 17-->18, WBC 11.3, UDS negative, all level negative, UA negative except for glucose greater than 500, as well as protein greater than 300.  CT head showed recent infarct in the right cerebellum.  Teleneurology was consulted.  MRI pending, if positive will be transferred to [facility] for further evaluation.  10/20 Reports numbness in his feet.  Also reports generalized weakness.  08/13/19:Patient was seen and examined at his bedside. No acute events overnight.  Uncontrolled hypertension with systolic blood pressure greater than 200 this morning.  Started on Norvasc.  Plan for TEE and possible loop recorder placement today.  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 Seen by neurology, stroke team.  Appreciate recommendations. Ongoing stroke work-up Hemoglobin A1c 11.6 goal less than 7.0 LDL pending 2D echo pending Negative neck MRA Twelve-lead EKG personally reviewed showing sinus rhythm with rate of 87 and QTC 453. Medical health PT with 24-hour supervision/assistance OT recommends no OT follow-up Speech evaluation pending Continue fall precautions TEE and loop recorder placement on 08/13/2019.    Type 2 diabetes with hyperglycemia Hemoglobin A1c 11.6 Continue Lantus, increase to 12 units nightly NovoLog 4 units 3 times daily Continue insulin sliding scale, sensitive  HLD LDL 248 on 08/13/2019  goal LDL less than 70 Continue Lipitor 80 mg daily  Uncontrolled HTN BP not at goal Started on norvasc, increase dose to 10 mg daily Goal BP normotensive gradually  Right pleural effusion Unclear etiology Independently reviewed chest x-ray done on admission which showed right pleural effusion. O2 saturation currently stable 100% on room air Continue to monitor  Elevated BNP No prior history of heart failure, he denies. Presented with BNP greater than 500. Lasix 80 mg as needed at home, hold due to AKI 2D echo LVEF 65-70% Monitor volume status C/w strict I's and O's and daily weight  AKI Previous records indicate baseline creatinine 0.8 with GFR greater than 60 Presented with creatinine of 2.72 with GFR of 26 Started on IV fluid hydration normal saline at 100 cc/h Hold Lasix for now Continue to avoid nephrotoxins Repeat BMP daily, add on Monitor urine output  Ambulatory dysfunction PT OT  assessed Recommendation for home health PT Continue physical therapy Fall precaution.  Tobacco abuse Tobacco cessation counseling done  at bedside Nicotine patch as needed  Essential hypertension No antihypertensives at home On as needed p.o. Lasix at home Ongoing permissive hypertension Gradually normalize blood pressure   DVT prophylaxis: Heparin   Code Status: Full  Family Communication: Son at bedside  Disposition Plan:  Patient is currently not appropriate for discharge at this time due to uncontrolled HTN and titration of his new medication.  Consults called:  Neurology stroke team      Objective: Vitals:   08/13/19 0845 08/13/19 0956 08/13/19 1005 08/13/19 1119  BP: (!) 204/83 137/65 (!) 143/74 (!) 151/76  Pulse: 84 77 81 82  Resp: 19 (!) 22 (!) 23 20  Temp: 98 F (36.7 C) (!) 97.3 F (36.3 C)  97.9 F (36.6 C)  TempSrc: Oral   Oral  SpO2: 97% 100% 97% 100%  Weight: 65.8 kg     Height: 5\' 5"  (1.651 m)       Intake/Output Summary (Last 24 hours) at 08/13/2019 1235 Last data filed at 08/13/2019 0949 Gross per 24 hour  Intake 1050 ml  Output 1220 ml  Net -170 ml   Filed Weights   08/11/19 1040 08/13/19 0405 08/13/19 0845  Weight: 61.7 kg 65.8 kg 65.8 kg    Exam:  . General: 48 y.o. year-old male WD  WN NAD A&O x 4 . Cardiovascular: Regular rate and rhythm no rubs or gallops no JVD.   Marland Kitchen Respiratory: Clear to auscultationauscultation no wheezes or rales.  Poor Respiratory effort.   . Abdomen:soft NT ND NBS . Musculoskeletal: no LE edema . Psychiatry: Mood is appropriate for condition and setting.   Data Reviewed: CBC: Recent Labs  Lab 08/11/19 1048 08/11/19 1054  WBC 11.3*  --   NEUTROABS 8.1*  --   HGB 13.4 14.6  HCT 41.7 43.0  MCV 87.1  --   PLT 494*  --    Basic Metabolic Panel: Recent Labs  Lab 08/11/19 1048 08/11/19 1054  NA 130* 133*  K 3.2* 3.3*  CL 97* 96*  CO2 27  --   GLUCOSE 446* 440*  BUN 22* 22*  CREATININE 2.72* 2.60*  CALCIUM 8.1*  --    GFR: Estimated Creatinine Clearance: 30.2 mL/min (A) (by C-G formula based on SCr of 2.6 mg/dL (H)).  Liver Function Tests: Recent Labs  Lab 08/11/19 1048  AST 7*  ALT 10  ALKPHOS 112  BILITOT 0.3  PROT 5.7*  ALBUMIN 1.8*   No results for input(s): LIPASE, AMYLASE in the last 168 hours. No results for input(s): AMMONIA in the last 168 hours. Coagulation Profile: Recent Labs  Lab 08/11/19 1048  INR 0.8   Cardiac Enzymes: No results for input(s): CKTOTAL, CKMB, CKMBINDEX, TROPONINI in the last 168 hours. BNP (last 3 results) No results for input(s): PROBNP in the last 8760 hours. HbA1C: Recent Labs    08/11/19 1047  HGBA1C 11.6*   CBG: Recent Labs  Lab 08/12/19 1146 08/12/19 1744 08/12/19 2114 08/13/19 0602 08/13/19 1228  GLUCAP 116* 124* 121* 94 144*   Lipid Profile: Recent Labs    08/13/19 0356  CHOL 312*  HDL 39*  LDLCALC 248*  TRIG 125  CHOLHDL 8.0   Thyroid Function Tests: Recent Labs    08/11/19 1047  TSH 2.160  FREET4 1.08   Anemia Panel: Recent Labs    08/12/19 1006  VITAMINB12 326  FOLATE 6.6  Urine analysis:    Component Value Date/Time   COLORURINE STRAW (A) 08/11/2019 1254   APPEARANCEUR CLEAR 08/11/2019 1254   LABSPEC 1.011 08/11/2019 1254   PHURINE 7.0 08/11/2019 1254   GLUCOSEU >=500 (A) 08/11/2019 1254   HGBUR SMALL (A) 08/11/2019 1254   BILIRUBINUR NEGATIVE 08/11/2019 1254   KETONESUR NEGATIVE 08/11/2019 1254   PROTEINUR >=300 (A) 08/11/2019 1254   NITRITE NEGATIVE 08/11/2019 1254   LEUKOCYTESUR NEGATIVE 08/11/2019 1254   Sepsis Labs: @LABRCNTIP (procalcitonin:4,lacticidven:4)  ) Recent Results (from the past 240 hour(s))  SARS CORONAVIRUS 2 (TAT 6-24 HRS) Nasopharyngeal Nasopharyngeal Swab     Status: None   Collection Time: 08/11/19 11:16 AM   Specimen: Nasopharyngeal Swab  Result Value Ref Range Status   SARS Coronavirus 2 NEGATIVE NEGATIVE Final    Comment: (NOTE) SARS-CoV-2 target nucleic acids are NOT DETECTED. The SARS-CoV-2 RNA is generally detectable in upper and lower respiratory specimens during the  acute phase of infection. Negative results do not preclude SARS-CoV-2 infection, do not rule out co-infections with other pathogens, and should not be used as the sole basis for treatment or other patient management decisions. Negative results must be combined with clinical observations, patient history, and epidemiological information. The expected result is Negative. Fact Sheet for Patients: SugarRoll.be Fact Sheet for Healthcare Providers: https://www.woods-mathews.com/ This test is not yet approved or cleared by the [place] FDA and  has been authorized for detection and/or diagnosis of SARS-CoV-2 by FDA under an Emergency Use Authorization (EUA). This EUA will remain  in effect (meaning this test can be used) for the duration of the COVID-19 declaration under Section 56 4(b)(1) of the Act, 21 U.S.C. section 360bbb-3(b)(1), unless the authorization is terminated or revoked sooner. Performed at [facility], [place] [address], [place], [place] [address]   Culture, blood (routine x 2)     Status: None (Preliminary result)   Collection Time: 08/11/19  5:45 PM   Specimen: BLOOD  Result Value Ref Range Status   Specimen Description   Final    BLOOD RIGHT ANTECUBITAL Performed at [facility] [address], [place], [place] [address]    Special Requests   Final    BOTTLES DRAWN AEROBIC AND ANAEROBIC Blood Culture adequate volume Performed at [facility] [address], [place], [place] [address]    Culture   Final    NO GROWTH 2 DAYS Performed at [facility] [address] [place], [place] [address]    Report Status PENDING  Incomplete      Studies: No results found.  Scheduled Meds: . amLODipine  5 mg Oral Daily  . aspirin  300 mg Rectal Daily   Or  . aspirin  325 mg Oral Daily  . atorvastatin  80 mg Oral q1800  . clopidogrel  75 mg Oral Daily  . heparin  5,000 Units  Subcutaneous Q8H  . insulin aspart  0-5 Units Subcutaneous QHS  . insulin aspart  0-9 Units Subcutaneous TID WC  . insulin aspart  4 Units Subcutaneous TID WC  . insulin glargine  12 Units Subcutaneous Q24H  . nicotine  21 mg Transdermal Daily    Continuous Infusions:    LOS: 2 days     [name], MD [facility] Pager [phone]  If 7PM-7AM, please contact night-coverage [email or web address] Password [facility] 08/13/2019, 12:35 PM

## 2019-08-13 NOTE — Progress Notes (Addendum)
Echocardiogram 2D Echocardiogram has been performed. Transesophageal Echocardiogram has been performed.  Phillip Camacho Aquilla Voiles 08/13/2019, 10:05 AM

## 2019-08-13 NOTE — Progress Notes (Addendum)
STROKE TEAM PROGRESS NOTE   INTERVAL HISTORY Pt sitting in chair eating chicken nuggets from [employer]. He had TEE and loop recorder done.   Vitals:   08/13/19 0845 08/13/19 0956 08/13/19 1005 08/13/19 1119  BP: (!) 204/83 137/65 (!) 143/74 (!) 151/76  Pulse: 84 77 81 82  Resp: 19 (!) 22 (!) 23 20  Temp: 98 F (36.7 C) (!) 97.3 F (36.3 C)  97.9 F (36.6 C)  TempSrc: Oral   Oral  SpO2: 97% 100% 97% 100%  Weight: 65.8 kg     Height: 5\' 5"  (1.651 m)       CBC:  Recent Labs  Lab 08/11/19 1048 08/11/19 1054  WBC 11.3*  --   NEUTROABS 8.1*  --   HGB 13.4 14.6  HCT 41.7 43.0  MCV 87.1  --   PLT 494*  --     Basic Metabolic Panel:  Recent Labs  Lab 08/11/19 1048 08/11/19 1054  NA 130* 133*  K 3.2* 3.3*  CL 97* 96*  CO2 27  --   GLUCOSE 446* 440*  BUN 22* 22*  CREATININE 2.72* 2.60*  CALCIUM 8.1*  --    Lipid Panel:     Component Value Date/Time   CHOL 312 (H) 08/13/2019 0356   TRIG 125 08/13/2019 0356   HDL 39 (L) 08/13/2019 0356   CHOLHDL 8.0 08/13/2019 0356   VLDL 25 08/13/2019 0356   LDLCALC 248 (H) 08/13/2019 0356   HgbA1c:  Lab Results  Component Value Date   HGBA1C 11.6 (H) 08/11/2019   Urine Drug Screen:     Component Value Date/Time   LABOPIA NONE DETECTED 08/11/2019 1254   COCAINSCRNUR NONE DETECTED 08/11/2019 1254   LABBENZ NONE DETECTED 08/11/2019 1254   AMPHETMU NONE DETECTED 08/11/2019 1254   THCU NONE DETECTED 08/11/2019 1254   LABBARB NONE DETECTED 08/11/2019 1254    Alcohol Level     Component Value Date/Time   ETH <10 08/11/2019 1048    IMAGING Mr Angio Head Wo Contrast  Result Date: 08/12/2019 CLINICAL DATA:  Stroke follow-up EXAM: MRA HEAD WITHOUT CONTRAST TECHNIQUE: Angiographic images of the Circle of Willis were obtained using MRA technique without intravenous contrast. COMPARISON:  Brain MRI from yesterday FINDINGS: Anterior circulation: Atheromatous irregularity of cavernous carotids with up to 50% stenosis at the right  paraclinoid segment. Degree of right ACA hypoplasia with at least moderate narrowed appearance of right A1 and A2 segments. Mild atheromatous irregularity of medium size vessels. Negative for aneurysm. Posterior circulation: The vertebral and basilar arteries are smooth and widely patent. Dominant left PICA and right AICA. Moderate stenosis at the right P2 segment and high-grade stenosis with apparent flow gap at the upper branch right PCA. IMPRESSION: Age advanced intracranial atherosclerosis with affected sites described above. Electronically Signed   By: Monte Fantasia M.D.   On: 08/12/2019 04:56   Mr Angio Neck Wo Contrast  Result Date: 08/12/2019 CLINICAL DATA:  Stroke follow-up EXAM: MRA NECK WITHOUT CONTRAST TECHNIQUE: Angiographic images of the neck were obtained using MRA technique without intravenous contrast. COMPARISON:  None. FINDINGS: The arch was not covered in the field of view. There appears to be 3 vessel branching with no evidence of great vessel stenoses. There is antegrade flow in the bilateral carotid and vertebral circulation. No significant stenosis, beading, or ulceration. There is mild narrowing at the proximal right ECA. IMPRESSION: Essentially negative neck MRA. Electronically Signed   By: Monte Fantasia M.D.   On: 08/12/2019 04:58  Mr Brain Wo Contrast  Result Date: 08/11/2019 CLINICAL DATA:  Left-sided weakness, facial droop, and slurred speech. History of hypertension and diabetes. EXAM: MRI HEAD WITHOUT CONTRAST TECHNIQUE: Multiplanar, multiecho pulse sequences of the brain and surrounding structures were obtained without intravenous contrast. COMPARISON:  Head CT 08/11/2019 FINDINGS: Brain: There is a 1 cm acute infarct medially in the right cerebellar hemisphere. Additionally, there are scattered punctate acute infarcts involving the left greater than right cerebral hemispheres, predominantly white matter as well as left thalamus. Scattered small foci of T2  hyperintensity elsewhere in the cerebral white matter bilaterally and brainstem/right cerebral peduncle are nonspecific though may reflect mild chronic small vessel ischemic disease given vascular risk factors. Chronic lacunar infarcts are present in the dorsal thalami bilaterally. The ventricles and sulci are within normal limits in size for age. A cavum septum pellucidum is incidentally noted. No intracranial hemorrhage, mass, midline shift, or extra-axial fluid collection is identified. Vascular: Major intracranial vascular flow voids are preserved. Skull and upper cervical spine: Unremarkable bone marrow signal. Sinuses/Orbits: Unremarkable orbits. Trace left mastoid fluid. Mild left ethmoid air cell mucosal thickening. Other: 2 cm lipoma in the scalp posterior to the right ear. IMPRESSION: 1. Small acute infarcts in the right cerebellum and both cerebral hemispheres. 2. Mild chronic small vessel ischemic disease. Electronically Signed   By: Logan Bores M.D.   On: 08/11/2019 15:58   US Renal  Result Date: 08/11/2019 CLINICAL DATA:  Acute kidney injury EXAM: RENAL / URINARY TRACT ULTRASOUND COMPLETE COMPARISON:  None. FINDINGS: Right Kidney: Renal measurements: 10.7 x 5.6 x 6.5 cm = volume: 202.2 mL . Echogenicity is increased. Renal cortical thickness is normal. No mass, perinephric fluid, or hydronephrosis visualized. No sonographically demonstrable calculus or ureterectasis. Left Kidney: Renal measurements: 11.3 x 7.4 x 4.4 cm = volume: 192.7 mL. Echogenicity is increased. Renal cortical thickness is normal. No mass, perinephric fluid, or hydronephrosis visualized. No sonographically demonstrable calculus or ureterectasis. Bladder: Appears normal for degree of bladder distention. Other: None IMPRESSION: Increased renal echogenicity, a finding indicative of medical renal disease. Renal cortical thickness normal. No obstructing focus on either side. Study otherwise unremarkable. Electronically Signed   By:  Lowella Grip III M.D.   On: 08/11/2019 14:36   Dg Chest Port 1 View  Result Date: 08/11/2019 CLINICAL DATA:  Stroke. EXAM: PORTABLE CHEST 1 VIEW COMPARISON:  None. FINDINGS: The heart size and mediastinal contours are within normal limits. Normal pulmonary vascularity. Small to moderate right and small left pleural effusions with adjacent bibasilar atelectasis. No pneumothorax. No acute osseous abnormality. IMPRESSION: 1. Small to moderate right and small left pleural effusions with adjacent bibasilar atelectasis. Electronically Signed   By: Titus Dubin M.D.   On: 08/11/2019 15:10   Vas [place] Lower Extremity Venous (dvt)  Result Date: 08/12/2019  Lower Venous Study Indications: Stroke.  Performing Technologist: Antonieta Pert RDMS, RVT  Examination Guidelines: A complete evaluation includes B-mode imaging, spectral Doppler, color Doppler, and power Doppler as needed of all accessible portions of each vessel. Bilateral testing is considered an integral part of a complete examination. Limited examinations for reoccurring indications may be performed as noted.  +---------+---------------+---------+-----------+----------+--------------+ RIGHT    CompressibilityPhasicitySpontaneityPropertiesThrombus Aging +---------+---------------+---------+-----------+----------+--------------+ CFV      Full           Yes      Yes                                 +---------+---------------+---------+-----------+----------+--------------+  SFJ      Full                                                        +---------+---------------+---------+-----------+----------+--------------+ FV Prox  Full                                                        +---------+---------------+---------+-----------+----------+--------------+ FV Mid   Full                                                        +---------+---------------+---------+-----------+----------+--------------+ FV DistalFull                                                         +---------+---------------+---------+-----------+----------+--------------+ PFV      Full                                                        +---------+---------------+---------+-----------+----------+--------------+ POP      Full           Yes      Yes                                 +---------+---------------+---------+-----------+----------+--------------+ PTV      Full                                                        +---------+---------------+---------+-----------+----------+--------------+ PERO     Full                                                        +---------+---------------+---------+-----------+----------+--------------+ GSV      Full                                                        +---------+---------------+---------+-----------+----------+--------------+ atherosclerotic changes throughout extremity.  +---------+---------------+---------+-----------+----------+--------------+ LEFT     CompressibilityPhasicitySpontaneityPropertiesThrombus Aging +---------+---------------+---------+-----------+----------+--------------+ CFV      Full           Yes      Yes                                 +---------+---------------+---------+-----------+----------+--------------+  SFJ      Full                                                        +---------+---------------+---------+-----------+----------+--------------+ FV Prox  Full                                                        +---------+---------------+---------+-----------+----------+--------------+ FV Mid   Full                                                        +---------+---------------+---------+-----------+----------+--------------+ FV DistalFull                                                        +---------+---------------+---------+-----------+----------+--------------+ PFV      Full                                                         +---------+---------------+---------+-----------+----------+--------------+ POP      Full           Yes      Yes                                 +---------+---------------+---------+-----------+----------+--------------+ PTV      Full                                                        +---------+---------------+---------+-----------+----------+--------------+ PERO     Full                                                        +---------+---------------+---------+-----------+----------+--------------+ GSV      Full                                                        +---------+---------------+---------+-----------+----------+--------------+ atherosclerotic changes throughout extremity.    Summary: Right: There is no evidence of deep vein thrombosis in the lower extremity. No cystic structure found in the popliteal fossa. Left: There is no evidence of deep vein thrombosis in the lower extremity. No cystic structure found in the popliteal  fossa.  *See table(s) above for measurements and observations. Electronically signed by Servando Snare MD on 08/12/2019 at 3:01:57 PM.    Final    TEE Normal cardiac TEE. No intracardiac thrombus, mass or shunt. There is moderate thickness and ulcerated plaque in the proximal aortic arch, but no mobile components are seen.   PHYSICAL EXAM  General - Well nourished, well developed, in no apparent distress.  Ophthalmologic - fundi not visualized due to noncooperation.  Cardiovascular - Regular rate and rhythm.  Mental Status -  Level of arousal and orientation to time, place, and person were intact. Language including expression, naming, repetition, comprehension was assessed and found intact.  Cranial Nerves II - XII - II - Visual field intact OU. III, IV, VI - Extraocular movements intact. V - Facial sensation intact bilaterally. VII - Facial movement intact  bilaterally. VIII - Hearing & vestibular intact bilaterally. X - Palate elevates symmetrically. XI - Chin turning & shoulder shrug intact bilaterally. XII - Tongue protrusion intact.  Motor Strength - The patient's strength was normal in all extremities and pronator drift was absent.  Bulk was normal and fasciculations were absent.   Motor Tone - Muscle tone was assessed at the neck and appendages and was normal.  Reflexes - The patient's reflexes were symmetrical in all extremities and he had no pathological reflexes.  Sensory - Light touch, temperature/pinprick were assessed and were symmetrical.    Coordination - The patient had normal movements in the hands with no ataxia or dysmetria.  Tremor was absent.  Gait and Station - deferred.   ASSESSMENT/PLAN [name] is a 48 y.o. male with history of HTN, DB, tobacco abuse presenting to [facility] ED with confusion, slurred speech, drooling, L facial droop, L side weakness with L sided CP. He did not receive IV t-PA due as out of the window.   Stroke: R small cerebellar and bilateral MCA/PCA, right PCA, right thalamic punctate infarcts embolic secondary to unknown source  CT head age indeterminate R cerebellar infarct. Sinus dz. R septum deviation  MRI  Small R cerebellar and B cerebral punctate infarcts. Mild small vessel disease.   MRA head age advanced atherosclerosis, right ICA siphon 50% stenosis, right P2 moderate and P3 high-grade stenosis.  MRA neck Unremarkable   LE Doppler  No DVT  2D Echo EF 65 to 70%  TEE - no PFO, moderate thickness and ulcerated plaque in aortic arch   Loop recorder placed  LDL 248   HgbA1c 11.6   Heparin 5000 units sq tid for VTE prophylaxis  No antithrombotic prior to admission, now on aspirin 325 daily and clopidogrel 75 mg daily.  Continue DAPT for 3 months and then aspirin alone given severe intracranial stenosis.  Therapy recommendations:  OP PT, no  OT  Disposition:  Return home  Aortic arch atherosclerosis  TEE showed proximal aortic arch ulcerated plaque  Could be potential source of emboli  Continue DAPT  Stroke risk factor modifications  Hypertensive Urgency   BP as high as 228/95  Stable on the high end  On amlodipine 2.5  Permissive hypertension (OK if < 220/120) but gradually normalize in 3-5 days  Long-term BP goal 130-150 given severe intracranial stenosis  Hyperlipidemia  Home meds:  No statin  Now on lipitor 80  LDL 248, goal < 70  Continue statin at discharge  Diabetes type II, Uncontrolled  HgbA1c 11.6, goal < 7.0  CBGs  SSI  On Lantus  DB  coordinator following  Close PCP follow-up for better DM control  CKD  Creatinine 2.72->2.6  Has outpatient follow-up with nephrology  Tobacco abuse  Current smoker  Smoking cessation counseling provided  Nicotine patch provided  Pt is willing to quit  Other Stroke Risk Factors    Other Active Problems  Leukocytosis WBC 11.3    Hypokalemia K 3.3-supplement    Elevated BNP  R pleural effusion   Hospital day # 2  Neurology will sign off. Please call with questions. Pt will follow up with stroke clinic NP at [facility] in about 4 weeks. Thanks for the consult.  Rosalin Hawking, MD PhD Stroke Neurology 08/13/2019 1:34 PM    To contact Stroke Continuity provider, please refer to [email or web address] After hours, contact General Neurology

## 2019-08-14 LAB — BASIC METABOLIC PANEL WITH GFR
Anion gap: 8 (ref 5–15)
BUN: 26 mg/dL — ABNORMAL HIGH (ref 6–20)
CO2: 20 mmol/L — ABNORMAL LOW (ref 22–32)
Calcium: 8 mg/dL — ABNORMAL LOW (ref 8.9–10.3)
Chloride: 110 mmol/L (ref 98–111)
Creatinine, Ser: 2.67 mg/dL — ABNORMAL HIGH (ref 0.61–1.24)
GFR calc Af Amer: 31 mL/min — ABNORMAL LOW
GFR calc non Af Amer: 27 mL/min — ABNORMAL LOW
Glucose, Bld: 169 mg/dL — ABNORMAL HIGH (ref 70–99)
Potassium: 4.1 mmol/L (ref 3.5–5.1)
Sodium: 138 mmol/L (ref 135–145)

## 2019-08-14 LAB — GLUCOSE, CAPILLARY
Glucose-Capillary: 121 mg/dL — ABNORMAL HIGH (ref 70–99)
Glucose-Capillary: 147 mg/dL — ABNORMAL HIGH (ref 70–99)

## 2019-08-14 LAB — CARDIOLIPIN ANTIBODIES, IGG, IGM, IGA
Anticardiolipin IgA: <9 APL U/mL (ref 0–11)
Anticardiolipin IgG: <9 GPL U/mL (ref 0–14)
Anticardiolipin IgM: 9 MPL U/mL (ref 0–12)

## 2019-08-14 LAB — PROTEIN C, TOTAL: Protein C, Total: 110 % (ref 60–150)

## 2019-08-14 MED ORDER — ATORVASTATIN CALCIUM 80 MG PO TABS: 80.0000 mg | ORAL_TABLET | Freq: Every day | ORAL | 0 refills | Status: DC

## 2019-08-14 MED ORDER — INSULIN ASPART 100 UNIT/ML FLEXPEN: 4.0000 [IU] | PEN_INJECTOR | Freq: Three times a day (TID) | SUBCUTANEOUS | 0 refills | Status: DC

## 2019-08-14 MED ORDER — CLOPIDOGREL BISULFATE 75 MG PO TABS: 75.0000 mg | ORAL_TABLET | Freq: Every day | ORAL | 0 refills | Status: AC

## 2019-08-14 MED ORDER — AMLODIPINE BESYLATE 10 MG PO TABS
10.0000 mg | ORAL_TABLET | Freq: Every day | ORAL | Status: DC
  Administered 2019-08-14: 10 mg via ORAL
  Filled 2019-08-14: qty 1

## 2019-08-14 MED ORDER — BLOOD GLUCOSE MONITOR KIT: PACK | 0 refills | Status: DC

## 2019-08-14 MED ORDER — AMLODIPINE BESYLATE 10 MG PO TABS: 10.0000 mg | ORAL_TABLET | Freq: Every day | ORAL | 0 refills | Status: DC

## 2019-08-14 MED ORDER — INSULIN GLARGINE 100 UNIT/ML SOLOSTAR PEN: 12.0000 [IU] | PEN_INJECTOR | Freq: Every day | SUBCUTANEOUS | 0 refills | Status: DC

## 2019-08-14 MED ORDER — NICOTINE 21 MG/24HR TD PT24: 21.0000 mg | MEDICATED_PATCH | Freq: Every day | TRANSDERMAL | 0 refills | Status: DC

## 2019-08-14 MED ORDER — ASPIRIN 325 MG PO TABS: 325.0000 mg | ORAL_TABLET | Freq: Every day | ORAL | 0 refills | Status: DC

## 2019-08-14 NOTE — Discharge Summary (Addendum)
Discharge Summary  Phillip Camacho [id number] DOB: [birth year]  PCP: Patient, No Pcp Per  Admit date: 08/11/2019 Discharge date: 08/14/2019  Time spent: 35 minutes  Recommendations for Outpatient Follow-up:  1. Follow-up with neurology 2. Follow-up with your primary care provider 3. Follow-up with cardiology 4. Take medications as prescribed 5. Completely abstain from tobacco use  Discharge Diagnoses:  Active Hospital Problems   Diagnosis Date Noted  . Acute CVA (cerebrovascular accident) ([place]) 08/11/2019  . DM (diabetes mellitus) ([employer]) 08/11/2019  . Essential hypertension 08/11/2019  . Tobacco abuse 08/11/2019    Resolved Hospital Problems  No resolved problems to display.    Discharge Condition: Stable  Diet recommendation: Heart healthy carb modified diet.  Vitals:   08/14/19 0400 08/14/19 0834  BP: (!) 177/85 (!) 178/79  Pulse: 84 88  Resp: 16 12  Temp: 98 F (36.7 C) 97.6 F (36.4 C)  SpO2: 98% 100%    History of present illness:   Phillip V Nguyenis a 48 y.o.malewith medical history significant forhypertension, diabetes, tobacco abuse, presents to the ED aftersonfound patient to be very confused, slurred speech, drooling, left-sided facial droop,left-sided weakness, with some left-sided chest pain this a.m.Sonlast saw patient well around 8 PM on 08/10/2019.On initial presentation, patient was noted to be altered, with significant slurred speech.By my interview, patient was more awake, able to answer simple questions in [place], son at bedside. Patient complains of reproducible left-sided chest pain, reports has been ongoing for about a couple of weeks, nothing makes it better or worse. Denies any associated nausea/vomiting, shortness of breath, abdominal pain, dysuria, cough/sore throat.Patient has not seen a doctor in years. Patient was scheduled to see nephrology at [facility] next month.  ED Course:Afebrile, noted to be in  hypertensive crisis with BP228/95,saturating well on room air,labs showed glucose of 446, normal bicarb/anion gap,creatinine of2.72,BNP 591.6,troponin17-->18,WBC 11.3,UDS negative,all level negative, UA negative except for glucose greater than 500,as well as protein greater than 300.CT head showed recent infarct in the right cerebellum.Teleneurology was consulted.MRI pending,if positive will be transferred to [facility] for further evaluation.  10/20 Reports numbness in his feet.  Also reports generalized weakness.  Hospital course complicated by uncontrolled hypertension with SBP greater than 200.  Started on Norvasc, increased to 10 mg daily with improvement of blood pressure.  Post TEE and loop recorder placement on 08/13/2019.  08/14/19: Patient was seen and examined at his bedside this morning.  He wants to go home.  Hypokalemic on 08/13/2019 repleted with p.o. KCl supplement 40 meq x 2 doses, repeated labs pending.  Vital signs reviewed and are stable.  Patient will need to follow-up with his primary care provider and repeat BMP on Monday, 08/18/2019.  Hospital Course:  Principal Problem:   Acute CVA (cerebrovascular accident) [facility]) Active Problems:   DM (diabetes mellitus) ([place])   Essential hypertension   Tobacco abuse  Per neurology: Phillip Camacho is a 48 y.o. male with history of HTN, DB, tobacco [facility]usep[facility], slurred speech, drooling, L facial droop, L side weakness with L sided CP.Hedid not receive IV t-PA due as out of the window.   Stroke:Rsmallcerebellar andbilateral MCA/PCA, right PCA, right thalamic punctateinfarctsembolic secondary to unknownsource  CT headage indeterminate R cerebellar infarct. Sinus dz. R septum deviation  MRISmall R cerebellar and B cerebralpunctateinfarcts. Mild small vessel disease.   MRAhead age advanced atherosclerosis, right ICA siphon 50% stenosis, right  P2 moderate and P3 high-grade stenosis.  MRA neckUnremarkable  LEDopplerNo DVT  2D EchoEF 65 to 70%  TEE - no PFO, moderate thickness and ulcerated plaque in aortic arch   Loop recorder placed  LDL248   HgbA1c11.6   Heparin 5000 units sq tidfor VTE prophylaxis  No antithromboticprior to admission, now on aspirin325daily and clopidogrel 75 mg daily.Continue DAPT for 3 months and then aspirin alone given severe intracranial stenosis.  Therapy recommendations:OP PT, no OT  Disposition:Return home  Aortic arch atherosclerosis  TEE showed proximal aortic arch ulcerated plaque  Could be potential source of emboli  Continue DAPT  Stroke risk factor modifications  HypertensiveUrgency   BP as high as 228/95  Stableon the high end  On amlodipine 2.5  Permissive hypertension (OK if < 220/120) but gradually normalize in3-5days  Long-term BP goal130-150 given severe intracranial stenosis  Acute right cerebellar and both cerebral hemispheric CVA Confirmed on MRI brain. Seen by neurology, stroke team.  Hemoglobin A1c 11.6 goal less than 7.0 LDL 248, goal less than 70. TEE done on 08/13/2019 no PFO Negative neck MRA Twelve-lead EKG personally reviewed showing sinus rhythm with rate of 87 and QTC 453. Medical health PT with 24-hour supervision/assistance OT recommends no OT follow-up Speech evaluation pending Continue fall precautions TEE and loop recorder placement on 08/13/2019 Follow-up with neurology and cardiology.    Type 2 diabetes with hyperglycemia Hemoglobin A1c 11.6 Continue Lantus, increase to 12 units nightly NovoLog 4 units 3 times daily Stop Metformin in the setting of renal insufficiency Follow-up with your PCP  HLD LDL 248 on 08/13/2019  goal LDL less than 70 Continue Lipitor 80 mg daily Follow-up with your PCP  Uncontrolled HTN BP not at goal Started on norvasc, increase dose to 10 mg daily Goal BP normotensive  gradually  Right pleural effusion Unclear etiology Independently reviewed chest x-ray done on admission which showed right pleural effusion. O2 saturation currently stable 100% on room air Continue to hold off Lasix due to renal insufficiency Follow-up with cardiology  Elevated BNP No prior history of heart failure, he denies. Presented with BNP greater than 500. Lasix 80 mg as needed at home, hold due to AKI 2D echo LVEF 65-70% Monitor volume status C/w strict I's and O's and daily weight  Improving AKI, likely prerenal in the setting of dehydration Previous records indicate baseline creatinine 0.8 with GFR greater than 60 Presented with creatinine of 2.72 with GFR of 26 Started on IV fluid hydration normal saline at 100 cc/h Hold Lasix for now Continue to avoid nephrotoxins such as NSAIDs Creatinine 2.55 with GFR of 29 on 08/13/2019. Follow-up with your PCP  Hypokalemia Potassium 2.8 on 08/13/2019 Repleted with p.o. KCl 40 mEq x 2 doses Repeat BMP on 08/14/2019.  Ambulatory dysfunction PT OT assessed Recommendation for home health PT Continue physical therapy Fall precaution.  Tobacco abuse Tobacco cessation counseling done at bedside Nicotine patch as needed  Essential hypertension No antihypertensives at home On as needed p.o. Lasix at home, continue to hold off Lasix due to renal insufficiency. Started on Norvasc 10 mg daily Follow-up with your PCP   Code Status:Full   Consults called: Neurology stroke team    Discharge Exam: BP (!) 178/79 (BP Location: Right Arm)   Pulse 88   Temp 97.6 F (36.4 C) (Oral)   Resp 12   Ht 5\' 5"  (1.651 m)   Wt 65.8 kg   SpO2 100%   BMI 24.14 kg/m  . General: 48 y.o. year-old male well developed well nourished in no acute distress.  Alert and oriented x3. . Cardiovascular: Regular rate and rhythm with no rubs or gallops.  No thyromegaly or JVD noted.   Marland Kitchen Respiratory: Clear to auscultation with no  wheezes or rales. Good inspiratory effort. . Abdomen: Soft nontender nondistended with normal bowel sounds x4 quadrants. . Musculoskeletal: No lower extremity edema. 2/4 pulses in all 4 extremities. Marland Kitchen Psychiatry: Mood is appropriate for condition and setting  Discharge Instructions You were cared for by a hospitalist during your hospital stay. If you have any questions about your discharge medications or the care you received while you were in the hospital after you are discharged, you can call the unit and asked to speak with the hospitalist on call if the hospitalist that took care of you is not available. Once you are discharged, your primary care physician will handle any further medical issues. Please note that NO REFILLS for any discharge medications will be authorized once you are discharged, as it is imperative that you return to your primary care physician (or establish a relationship with a primary care physician if you do not have one) for your aftercare needs so that they can reassess your need for medications and monitor your lab values.  Discharge Instructions    Ambulatory referral to Neurology   Complete by: As directed    Follow up with stroke clinic NP (Jessica Vanschaick or Cecille Rubin, if both not available, consider Zachery Dauer, or Ahern) at [facility] in about 4 weeks. Thanks.   Ambulatory referral to Physical Therapy   Complete by: As directed      Allergies as of 08/14/2019   No Known Allergies     Medication List    STOP taking these medications   furosemide 80 MG tablet Commonly known as: LASIX   ibuprofen 600 MG tablet Commonly known as: ADVIL   metFORMIN 500 MG tablet Commonly known as: Glucophage     TAKE these medications   amLODipine 10 MG tablet Commonly known as: NORVASC Take 1 tablet (10 mg total) by mouth daily. Start taking on: August 15, 2019   aspirin 325 MG tablet Take 1 tablet (325 mg total) by mouth daily. Start taking on: August 15, 2019   atorvastatin 80 MG tablet Commonly known as: LIPITOR Take 1 tablet (80 mg total) by mouth daily at 6 PM.   clopidogrel 75 MG tablet Commonly known as: PLAVIX Take 1 tablet (75 mg total) by mouth daily for 21 days. Start taking on: August 15, 2019   insulin aspart 100 UNIT/ML FlexPen Commonly known as: NOVOLOG Inject 4 Units into the skin 3 (three) times daily with meals.   Insulin Glargine 100 UNIT/ML Solostar Pen Commonly known as: LANTUS Inject 12 Units into the skin daily.   nicotine 21 mg/24hr patch Commonly known as: NICODERM CQ - dosed in mg/24 hours Place 1 patch (21 mg total) onto the skin daily. Start taking on: August 15, 2019      No Known Allergies Follow-up Information    [place] [facility] Follow [facility].   Specialty: Cardiology Why: 08/26/2019 @ 8:30AM, wound check visit Contact information: [address], [place] Follow up.   Specialty: Rehabilitation Why: The outpatient therapy will contact you for the first appointment Contact information: [place] [id number] [place] [phone]       [facility]. Schedule an appointment  as soon as possible for a visit in 4 week(s).   Specialty: Neurology Contact information: [place]. Call in 1 day(s).   Why: Please call for a post hospital follow-up appointment. Contact information: [address] [place] [phone]           The results of significant diagnostics from this hospitalization (including imaging, microbiology, ancillary and laboratory) are listed below for reference.    Significant Diagnostic Studies: Ct Head Wo Contrast  Result Date:  08/11/2019 CLINICAL DATA:  Left-sided facial droop with slurred speech. Left-sided weakness EXAM: CT HEAD WITHOUT CONTRAST TECHNIQUE: Contiguous axial images were obtained from the base of the skull through the vertex without intravenous contrast. COMPARISON:  None. FINDINGS: Brain: Ventricles are normal in size and configuration. There is a small cavum septum pellucidum, an anatomic variant. There is no intracranial mass, hemorrhage, extra-axial fluid collection, or midline shift. There is a focal infarct in the posterior aspect of the dentate nucleus on the right medially which may be recent. No other focal infarct evident. Vascular: No hyperdense vessel evident. There is calcification in each carotid siphon region. Skull: The bony calvarium appears intact. Sinuses/Orbits: There is mucosal thickening in several ethmoid air cells. Other visualized paranasal sinuses are clear. Orbits appear symmetric bilaterally. There is rightward deviation of the nasal septum. Other: Visualized mastoid air cells are clear. IMPRESSION: Age uncertain and potentially recent infarct in the posteromedial aspect of the dentate nucleus of the right cerebellum. No other parenchymal abnormality to suggest infarct noted. No mass or hemorrhage. There are foci of arterial vascular calcification. There is mucosal thickening in several ethmoid air cells. There is rightward deviation of the nasal septum. Electronically Signed   By: Lowella Grip III M.D.   On: 08/11/2019 11:08   Mr Angio Head Wo Contrast  Result Date: 08/12/2019 CLINICAL DATA:  Stroke follow-up EXAM: MRA HEAD WITHOUT CONTRAST TECHNIQUE: Angiographic images of the Circle of Willis were obtained using MRA technique without intravenous contrast. COMPARISON:  Brain MRI from yesterday FINDINGS: Anterior circulation: Atheromatous irregularity of cavernous carotids with up to 50% stenosis at the right paraclinoid segment. Degree of right ACA hypoplasia with at least moderate  narrowed appearance of right A1 and A2 segments. Mild atheromatous irregularity of medium size vessels. Negative for aneurysm. Posterior circulation: The vertebral and basilar arteries are smooth and widely patent. Dominant left PICA and right AICA. Moderate stenosis at the right P2 segment and high-grade stenosis with apparent flow gap at the upper branch right PCA. IMPRESSION: Age advanced intracranial atherosclerosis with affected sites described above. Electronically Signed   By: Monte Fantasia M.D.   On: 08/12/2019 04:56   Mr Angio Neck Wo Contrast  Result Date: 08/12/2019 CLINICAL DATA:  Stroke follow-up EXAM: MRA NECK WITHOUT CONTRAST TECHNIQUE: Angiographic images of the neck were obtained using MRA technique without intravenous contrast. COMPARISON:  None. FINDINGS: The arch was not covered in the field of view. There appears to be 3 vessel branching with no evidence of great vessel stenoses. There is antegrade flow in the bilateral carotid and vertebral circulation. No significant stenosis, beading, or ulceration. There is mild narrowing at the proximal right ECA. IMPRESSION: Essentially negative neck MRA. Electronically Signed   By: Monte Fantasia M.D.   On: 08/12/2019 04:58   Mr Brain Wo Contrast  Result Date: 08/11/2019 CLINICAL DATA:  Left-sided weakness,  facial droop, and slurred speech. History of hypertension and diabetes. EXAM: MRI HEAD WITHOUT CONTRAST TECHNIQUE: Multiplanar, multiecho pulse sequences of the brain and surrounding structures were obtained without intravenous contrast. COMPARISON:  Head CT 08/11/2019 FINDINGS: Brain: There is a 1 cm acute infarct medially in the right cerebellar hemisphere. Additionally, there are scattered punctate acute infarcts involving the left greater than right cerebral hemispheres, predominantly white matter as well as left thalamus. Scattered small foci of T2 hyperintensity elsewhere in the cerebral white matter bilaterally and brainstem/right  cerebral peduncle are nonspecific though may reflect mild chronic small vessel ischemic disease given vascular risk factors. Chronic lacunar infarcts are present in the dorsal thalami bilaterally. The ventricles and sulci are within normal limits in size for age. A cavum septum pellucidum is incidentally noted. No intracranial hemorrhage, mass, midline shift, or extra-axial fluid collection is identified. Vascular: Major intracranial vascular flow voids are preserved. Skull and upper cervical spine: Unremarkable bone marrow signal. Sinuses/Orbits: Unremarkable orbits. Trace left mastoid fluid. Mild left ethmoid air cell mucosal thickening. Other: 2 cm lipoma in the scalp posterior to the right ear. IMPRESSION: 1. Small acute infarcts in the right cerebellum and both cerebral hemispheres. 2. Mild chronic small vessel ischemic disease. Electronically Signed   By: Logan Bores M.D.   On: 08/11/2019 15:58   US Renal  Result Date: 08/11/2019 CLINICAL DATA:  Acute kidney injury EXAM: RENAL / URINARY TRACT ULTRASOUND COMPLETE COMPARISON:  None. FINDINGS: Right Kidney: Renal measurements: 10.7 x 5.6 x 6.5 cm = volume: 202.2 mL . Echogenicity is increased. Renal cortical thickness is normal. No mass, perinephric fluid, or hydronephrosis visualized. No sonographically demonstrable calculus or ureterectasis. Left Kidney: Renal measurements: 11.3 x 7.4 x 4.4 cm = volume: 192.7 mL. Echogenicity is increased. Renal cortical thickness is normal. No mass, perinephric fluid, or hydronephrosis visualized. No sonographically demonstrable calculus or ureterectasis. Bladder: Appears normal for degree of bladder distention. Other: None IMPRESSION: Increased renal echogenicity, a finding indicative of medical renal disease. Renal cortical thickness normal. No obstructing focus on either side. Study otherwise unremarkable. Electronically Signed   By: Lowella Grip III M.D.   On: 08/11/2019 14:36   Dg Chest Port 1 View  Result  Date: 08/11/2019 CLINICAL DATA:  Stroke. EXAM: PORTABLE CHEST 1 VIEW COMPARISON:  None. FINDINGS: The heart size and mediastinal contours are within normal limits. Normal pulmonary vascularity. Small to moderate right and small left pleural effusions with adjacent bibasilar atelectasis. No pneumothorax. No acute osseous abnormality. IMPRESSION: 1. Small to moderate right and small left pleural effusions with adjacent bibasilar atelectasis. Electronically Signed   By: Titus Dubin M.D.   On: 08/11/2019 15:10   Vas [place] Lower Extremity Venous (dvt)  Result Date: 08/12/2019  Lower Venous Study Indications: Stroke.  Performing Technologist: Antonieta Pert RDMS, RVT  Examination Guidelines: A complete evaluation includes B-mode imaging, spectral Doppler, color Doppler, and power Doppler as needed of all accessible portions of each vessel. Bilateral testing is considered an integral part of a complete examination. Limited examinations for reoccurring indications may be performed as noted.  +---------+---------------+---------+-----------+----------+--------------+ RIGHT    CompressibilityPhasicitySpontaneityPropertiesThrombus Aging +---------+---------------+---------+-----------+----------+--------------+ CFV      Full           Yes      Yes                                 +---------+---------------+---------+-----------+----------+--------------+ SFJ      Full                                                        +---------+---------------+---------+-----------+----------+--------------+  FV Prox  Full                                                        +---------+---------------+---------+-----------+----------+--------------+ FV Mid   Full                                                        +---------+---------------+---------+-----------+----------+--------------+ FV DistalFull                                                         +---------+---------------+---------+-----------+----------+--------------+ PFV      Full                                                        +---------+---------------+---------+-----------+----------+--------------+ POP      Full           Yes      Yes                                 +---------+---------------+---------+-----------+----------+--------------+ PTV      Full                                                        +---------+---------------+---------+-----------+----------+--------------+ PERO     Full                                                        +---------+---------------+---------+-----------+----------+--------------+ GSV      Full                                                        +---------+---------------+---------+-----------+----------+--------------+ atherosclerotic changes throughout extremity.  +---------+---------------+---------+-----------+----------+--------------+ LEFT     CompressibilityPhasicitySpontaneityPropertiesThrombus Aging +---------+---------------+---------+-----------+----------+--------------+ CFV      Full           Yes      Yes                                 +---------+---------------+---------+-----------+----------+--------------+ SFJ      Full                                                        +---------+---------------+---------+-----------+----------+--------------+  FV Prox  Full                                                        +---------+---------------+---------+-----------+----------+--------------+ FV Mid   Full                                                        +---------+---------------+---------+-----------+----------+--------------+ FV DistalFull                                                        +---------+---------------+---------+-----------+----------+--------------+ PFV      Full                                                         +---------+---------------+---------+-----------+----------+--------------+ POP      Full           Yes      Yes                                 +---------+---------------+---------+-----------+----------+--------------+ PTV      Full                                                        +---------+---------------+---------+-----------+----------+--------------+ PERO     Full                                                        +---------+---------------+---------+-----------+----------+--------------+ GSV      Full                                                        +---------+---------------+---------+-----------+----------+--------------+ atherosclerotic changes throughout extremity.    Summary: Right: There is no evidence of deep vein thrombosis in the lower extremity. No cystic structure found in the popliteal fossa. Left: There is no evidence of deep vein thrombosis in the lower extremity. No cystic structure found in the popliteal fossa.  *See table(s) above for measurements and observations. Electronically signed by Servando Snare MD on 08/12/2019 at 3:01:57 PM.    Final     Microbiology: Recent Results (from the past 240 hour(s))  SARS CORONAVIRUS 2 (TAT 6-24 HRS) Nasopharyngeal Nasopharyngeal Swab     Status: None   Collection Time: 08/11/19 11:16 AM   Specimen: Nasopharyngeal  Swab  Result Value Ref Range Status   SARS Coronavirus 2 NEGATIVE NEGATIVE Final    Comment: (NOTE) SARS-CoV-2 target nucleic acids are NOT DETECTED. The SARS-CoV-2 RNA is generally detectable in upper and lower respiratory specimens during the acute phase of infection. Negative results do not preclude SARS-CoV-2 infection, do not rule out co-infections with other pathogens, and should not be used as the sole basis for treatment or other patient management decisions. Negative results must be combined with clinical observations, patient history, and epidemiological information. The  expected result is Negative. Fact Sheet for Patients: SugarRoll.be Fact Sheet for Healthcare Providers: https://www.woods-mathews.com/ This test is not yet approved or cleared by the [place] FDA and  has been authorized for detection and/or diagnosis of SARS-CoV-2 by FDA under an Emergency Use Authorization (EUA). This EUA will remain  in effect (meaning this test can be used) for the duration of the COVID-19 declaration under Section 56 4(b)(1) of the Act, 21 U.S.C. section 360bbb-3(b)(1), unless the authorization is terminated or revoked sooner. Performed at [facility], [place] [address], [place], [place] [address]   Culture, blood (routine x 2)     Status: None (Preliminary result)   Collection Time: 08/11/19  5:45 PM   Specimen: BLOOD  Result Value Ref Range Status   Specimen Description   Final    BLOOD RIGHT ANTECUBITAL Performed at [facility] [address], [place], [place] [address]    Special Requests   Final    BOTTLES DRAWN AEROBIC AND ANAEROBIC Blood Culture adequate volume Performed at [place] [address] [place], [place] [address]    Culture   Final    NO GROWTH 3 DAYS Performed at [facility], [place] [address], [place], [place] [address]    Report Status PENDING  Incomplete     Labs: Basic Metabolic Panel: Recent Labs  Lab 08/11/19 1048 08/11/19 1054 08/13/19 1350  NA 130* 133* 137  K 3.2* 3.3* 2.8*  CL 97* 96* 107  CO2 27  --  21*  GLUCOSE 446* 440* 244*  BUN 22* 22* 25*  CREATININE 2.72* 2.60* 2.55*  CALCIUM 8.1*  --  7.7*   Liver Function Tests: Recent Labs  Lab 08/11/19 1048  AST 7*  ALT 10  ALKPHOS 112  BILITOT 0.3  PROT 5.7*  ALBUMIN 1.8*   No results for input(s): LIPASE, AMYLASE in the last 168 hours. No results for input(s): AMMONIA in the last 168 hours. CBC: Recent Labs  Lab 08/11/19 1048 08/11/19 1054  WBC 11.3*   --   NEUTROABS 8.1*  --   HGB 13.4 14.6  HCT 41.7 43.0  MCV 87.1  --   PLT 494*  --    Cardiac Enzymes: No results for input(s): CKTOTAL, CKMB, CKMBINDEX, TROPONINI in the last 168 hours. BNP: BNP (last 3 results) Recent Labs    08/11/19 1050  BNP 591.6*    ProBNP (last 3 results) No results for input(s): PROBNP in the last 8760 hours.  CBG: Recent Labs  Lab 08/13/19 0602 08/13/19 1228 08/13/19 1735 08/13/19 2116 08/14/19 0604  GLUCAP 94 144* 287* 148* 121*       Signed:  [name], MD [facility] 08/14/2019, 11:17 AM

## 2019-08-14 NOTE — TOC Transition Note (Signed)
Transition of Care [facility]) - CM/SW Discharge Note   Patient Details  Name: Phillip Camacho MRN: [id number] Date of Birth: [birth year]  Transition of Care [facility]) CM/SW Contact:  Pollie Friar, RN Phone Number: 08/14/2019, 12:02 PM   Clinical Narrative:    Pt discharging home with outpatient therapy. Information on the AVS. Pt to stay with son at d/c until he is able to stay on his own. Daughter to be here today and will stay with the patient and son. Pt has transportation home.    Final next level of care: OP Rehab Barriers to Discharge: No Barriers Identified   Patient Goals and CMS Choice   CMS Medicare.gov Compare Post Acute Care list provided to:: Patient Choice offered to / list presented to : Patient  Discharge Placement                       Discharge Plan and Services   Discharge Planning Services: CM Consult                                 Social Determinants of Health (SDOH) Interventions     Readmission Risk Interventions No flowsheet data found.

## 2019-08-14 NOTE — Progress Notes (Signed)
Pt given discharge summary and discharged home Via son as transportation.

## 2019-08-14 NOTE — Progress Notes (Signed)
Occupational Therapy Treatment Patient Details Name: Phillip Camacho MRN: [id number] DOB: 1971/08/11 Today's Date: 08/14/2019    History of present illness Phillip Camacho is a 48 y.o. male with medical history significant for hypertension, diabetes, tobacco abuse, presents to the ED after son found patient to be very confused, slurred speech, drooling, left-sided facial droop, left-sided weakness, with some left-sided chest pain this a.m.    OT comments  Pt demonstrated improvement in dynamic standing balance and higher level problem solving tasks today. Pt able to complete several dynamic standing balance tasks with a focus on righting reactions and functional activity tolerance, (S) overall required. Pt safe to d/c home with family supervision.    Follow Up Recommendations  No OT follow up    Equipment Recommendations  None recommended by OT       Precautions / Restrictions Precautions Precautions: Fall Precaution Comments: limited english Restrictions Weight Bearing Restrictions: No       Mobility Bed Mobility Overal bed mobility: Modified Independent                Transfers Overall transfer level: Needs assistance Equipment used: None Transfers: Sit to/from Stand Sit to Stand: Supervision         General transfer comment: (S) overall for safety. Pt aware of deficits    Balance Overall balance assessment: Mild deficits observed, not formally tested             Standing balance comment: Mild postural sway/perturbations, no LOB or major balance deficits observed                           ADL either performed or assessed with clinical judgement   ADL Overall ADL's : At baseline                                                       Cognition Arousal/Alertness: Awake/alert Behavior During Therapy: WFL for tasks assessed/performed Overall Cognitive Status: Within Functional Limits for tasks assessed                                  General Comments: All cognition WFL during session. Appropriate command following, spatial orientation                   Pertinent Vitals/ Pain       Pain Assessment: No/denies pain         Frequency  Min 2X/week        Progress Toward Goals  OT Goals(current goals can now be found in the care plan section)  Progress towards OT goals: Progressing toward goals  Acute Rehab OT Goals Patient Stated Goal: go home Potential to Achieve Goals: Good ADL Goals Additional ADL Goal #1: pt will complete pathfinding task with 1 error allowed mod I Additional ADL Goal #2: Pt will complete dynamic balance task with adls mod i  Plan Discharge plan remains appropriate    Co-evaluation                 AM-PAC OT "6 Clicks" Daily Activity     Outcome Measure   Help from another person eating meals?: None Help from another person taking care of personal grooming?: None Help  from another person toileting, which includes using toliet, bedpan, or urinal?: None Help from another person bathing (including washing, rinsing, drying)?: None Help from another person to put on and taking off regular upper body clothing?: None Help from another person to put on and taking off regular lower body clothing?: None 6 Click Score: 24    End of Session Equipment Utilized During Treatment: Gait belt  OT Visit Diagnosis: Muscle weakness (generalized) (M62.81)   Activity Tolerance Patient tolerated treatment well   Patient Left in bed;with call bell/phone within reach   Nurse Communication Mobility status        Time: 1001-1012 OT Time Calculation (min): 11 min  Charges: OT General Charges $OT Visit: 1 Visit OT Treatments $Therapeutic Activity: 8-22 mins   Curtis Sites OTR/L 08/14/2019, 10:53 AM

## 2019-08-15 ENCOUNTER — Encounter (HOSPITAL_COMMUNITY): Payer: Self-pay | Admitting: Cardiovascular Disease

## 2019-08-15 LAB — ANTINUCLEAR ANTIBODIES, IFA: ANA Ab, IFA: NEGATIVE

## 2019-08-16 LAB — CULTURE, BLOOD (ROUTINE X 2)
Culture: NO GROWTH
Special Requests: ADEQUATE

## 2019-08-18 LAB — PROTHROMBIN GENE MUTATION

## 2019-08-18 LAB — FACTOR 5 LEIDEN

## 2019-08-18 LAB — MTHFR DNA ANALYSIS

## 2019-08-21 ENCOUNTER — Ambulatory Visit: Payer: BLUE CROSS/BLUE SHIELD

## 2019-08-25 ENCOUNTER — Other Ambulatory Visit: Payer: Self-pay | Admitting: *Deleted

## 2019-08-25 NOTE — Patient Outreach (Addendum)
[place] [facility]) Care Management  08/25/2019  QUENTAVIUS HUESTON 26-Apr-1971 [id number]   EMMI- stroke  RED ON EMMI ALERT Day # 9 Date: Sunday 08/24/19 1000  Red Alert Reason:Questions/problems with meds? Yes   Insurance: blue cross and blue shield [facility] admissions x 1  ED visits x 1 in the last 6 months  Last admission on 08/11/19 with D/c from [facility] on 08/14/19   Outreach attempt # 1 using [employer] interpreter # [phone] Patient is able to verify HIPAA, DOB and address [place] Management RN reviewed and addressed red alert with patient He gives permission for [place] RN CM to speak with his daughter [name] "at any time"    EMMI:  He confirms questions about his medications and reports he believes he is having side effects from his medications. He reports when he is taking his pills he feels "hot and his eyes become blurred" He reports this occurs when he wakes up in the mornings  [facility] RN CM went his list of medications on his after summary sheet with him.  [facility] RN CM assessed him to inquire about BP values He reports he is not taking his BP as he can not afford a BP cuff and reports his insurance does not cover the cost of one.  He had a TEE and loop recorder placement on 08/13/2019  Diabetes He states he is not aware if he has DM type 1 or 2 He reports a cbg of 287 this am and 114 cbg during this call to him [facility] RN CM briefly reviewed carbohydrates related to diabetes. He reports he has not had potatoes and no rice this week. When encouraged to eat more fruits, vegetables and meats. He reports he does not have them in the home and will need to go to get chicken and fish. He reports he had hot pockets and frozen meals only.   He reports he is not smoking. He reports taking insulin 3 times a day   Social:  Mr Hoopingarner is a 48 year old single [place] patient who reports living alone and is independent with his care needs. 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 He reports his daughter is aware of his new primary MD's name. His discharge instructions states he had not been seen by  09/04/19 He reports he is to see a primary care provider in [facility]  08/26/19 0830 CV loop implant from 08/13/19  09/17/19 0915 [facility] f/u stroke  09/17/19 Nephrology initial consult Adetoye Lufadeju  Advance Directives: Denies need for assist with advance directives   Consent: [facility] RN CM reviewed [facility] services with patient. Patient gave verbal consent for services [facility] telephonic RN CM.   Advised patient that there will be further automated EMMI- post discharge calls to assess how the patient is doing following the recent hospitalization Advised the patient that another call may be received from a nurse if any of their responses were  abnormal. Patient voiced understanding and was appreciative of f/u call.   Plan: [facility] RN CM will refer Mr Flattery to [place] for voiced concerns with reported side effects of feeling hot and blurred vision in the mornings  [facility] RN CM will follow up with Mr Dewinter and his daughter [name] within 7-10 business days   Pt encouraged to return a call to [facility] RN CM prn  [facility] RN CM sent a successful outreach letter as discussed with [facility] brochure enclosed for review   Dearl Rudden L. Lavina Hamman, RN, BSN, [place] Coordinator Office number [phone] Mobile number [phone]  Main [facility] number  [phone] Fax number [phone]

## 2019-08-26 ENCOUNTER — Other Ambulatory Visit: Payer: Self-pay

## 2019-08-26 ENCOUNTER — Other Ambulatory Visit: Payer: Self-pay | Admitting: *Deleted

## 2019-08-26 ENCOUNTER — Ambulatory Visit (INDEPENDENT_AMBULATORY_CARE_PROVIDER_SITE_OTHER): Payer: BLUE CROSS/BLUE SHIELD | Admitting: *Deleted

## 2019-08-26 DIAGNOSIS — I639 Cerebral infarction, unspecified: Secondary | ICD-10-CM

## 2019-08-26 LAB — CUP PACEART INCLINIC DEVICE CHECK
Date Time Interrogation Session: 20201103091054
Implantable Pulse Generator Implant Date: 20201021

## 2019-08-26 NOTE — Patient Instructions (Signed)
Call the [facility] at [phone] if you have any questions about your loop recorder or your home monitor.

## 2019-08-26 NOTE — Progress Notes (Signed)
ILR wound check in clinic. Steri strips removed by patient prior to visit. Wound well healed. Home monitor transmitting nightly. No episodes. Education provided via interpreter. All questions answered. Monthly summary reports and ROV with GT PRN.
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
## 2019-08-27 ENCOUNTER — Encounter: Payer: Self-pay | Admitting: *Deleted

## 2019-08-27 ENCOUNTER — Other Ambulatory Visit: Payer: Self-pay | Admitting: *Deleted

## 2019-08-27 NOTE — Patient Outreach (Addendum)
[place] [facility]) Care Management  [place]   08/27/2019  Phillip Camacho 04-18-1971 [id number]  Reason for referral: Medication Management  Referral source: EMMI Red Flag for medication issues Current insurance: Blue Cross [facility]  PMHx includes but not limited to: T2DM, hx smoking, hypertension, swelling, CKD, noted recent hospitalization 10/19-10/22 for cryptogenic stroke, also found to have AKI and hypertensive crisis with BP 228/85, SCr 2.72.  Patient started on insulin therapy, dual antiplatelet therapy, and several medication changes were made to regimen.    Outreach:  Successful telephone call with daughter, [name], who is listed as primary contact due to patient not speaking English well, needs [place] interpreter.  HIPAA identifiers verified.   [name] reports she is stationed in [place] with [employer] therefore is not able to visit patient in the home however does communicate with her father as much as possible telephonically.  She reports patient is separated from spouse and also has 2 sons however states that sons are not able to provide assistance with healthcare for patient.    She is not able to confirm patient's medications telephonically but does state he is taking aspirin BID (prescribed as 329m qDay)    She also states that she is working with DSS and [facility] for patient to apply for [place] Medicaid, disability, food stamps, and housing.   Daughter states patient is not a citizen and is waiting on USCIS for  green card in order to renew drivers licence and work. Patient is currently unemployed.   Patient's PCP is Dr. EArelia Sneddonbut [place] this) for later this month for further follow-up.  [place] [place]   Successful 3-way call with patient and interpreter.   . Patient reports taking new  medications prescribed at discharge (amlodipine, atorvastatin, aspirin, clopidogrel, insulins) but stated that none of his prescriptions have refills. . Patient reports he was counseled on insulin administration by retail pharmacist and does not have any questions on this. He does not check BG regularly due to pain with pricking finger, therefore has not been doing this consistently.  Reports BG values of 100s-200s.  . Patient confirms he is no longer taking metformin, furosemide, and ibuprofen.  He states he has not been on them for several months however later stated he had been taking furosemide PRN for swelling.  He mentions he does have a "little" swelling right now.   . Patient expressed difficulties with vision in the past 2-3 days, especially in the mornings for a few hours when he wakes up, stating he cannot see anything but that it does goes away.     Objective: The ASCVD Risk score (Mikey BussingDC Jr., et al., 2013) failed to calculate for the following reasons:   The patient has a prior MI or stroke diagnosis  Lab Results  Component Value Date   CREATININE 2.67 (H) 08/14/2019   CREATININE 2.55 (H) 08/13/2019   CREATININE 2.60 (H) 08/11/2019    Lab Results  Component Value Date   HGBA1C 11.6 (H) 08/11/2019    Lipid Panel     Component Value Date/Time   CHOL 312 (H) 08/13/2019 0356   TRIG 125 08/13/2019 0356   HDL 39 (L) 08/13/2019 0356   CHOLHDL 8.0 08/13/2019 0356   VLDL 25 08/13/2019 0356   LDLCALC 248 (H) 08/13/2019 0356    BP Readings  from Last 3 Encounters:  08/14/19 (!) 183/85  08/19/16 (!) 178/88    No Known Allergies  Medications Reviewed Today    Reviewed by Barbaraann Faster, RN (Registered Nurse) on 08/25/19 at [facility]: <None>  Medication Order Taking? Sig Documenting Provider Last Dose Status Informant  amLODipine (NORVASC) 10 MG tablet [id number]  Take 1 tablet (10 mg total) by mouth daily. Irene Pap [place], [place]  Active   aspirin 325 MG tablet  [id number]  Take 1 tablet (325 mg total) by mouth daily. [name], DO  Active   atorvastatin (LIPITOR) 80 MG tablet [id number]  Take 1 tablet (80 mg total) by mouth daily at 6 PM. [name], DO  Active   blood glucose meter kit and supplies KIT [id number]  Dispense based on patient and insurance preference. Use up to four times daily as directed. (FOR ICD-9 250.00, 250.01). [name], DO  Active   clopidogrel (PLAVIX) 75 MG tablet [id number]  Take 1 tablet (75 mg total) by mouth daily for 21 days. [name], DO  Active   insulin aspart (NOVOLOG) 100 UNIT/ML FlexPen [id number]  Inject 4 Units into the skin 3 (three) times daily with meals. Irene Pap N, DO  Active   Insulin Glargine (LANTUS) 100 UNIT/ML Solostar Pen [id number]  Inject 12 Units into the skin daily. Irene Pap N, DO  Active   nicotine (NICODERM CQ - DOSED IN MG/24 HOURS) 21 mg/24hr patch [id number]  Place 1 patch (21 mg total) onto the skin daily. [name], DO  Active           Assessment: Drugs sorted by system:  Cardiovascular: amlodipine 87m 1T PO daily, aspirin 848m4T PO daily, clopidogrel 7532mT PO daily, atorvastatin 23m83m PO daily.  Endocrine: Novolog 4u TID after meals, Lantus 12u daily.  Medication Review Findings:  . Patient originally taking OTC aspirin 81mg12mablets BID.  We counseled patient on taking either 4 tablets of 81mg 83mther or buying aspirin 325mg a79making 1 tablet daily.  Patient voiced understanding and stated he will take correct dose moving forward. . Patient has difficulty checking BG in mornings due to blurry vision during that time.  He has been checking sporadically in the day.  We reviewed importance of checking BG consistently and ideally in the morning after waking up to assess FBG.  Patient reports he can try to do this once he is able to see meter after vision improves.  We recommended also that patient record these values and bring to office visits with PCP so  diabetes management can be assessed.  Patient voiced understanding.  . Patient is not checking blood pressures at home due to inability to afford blood pressure machine.  THN can provide BP machine to patient as well as log book to record readings.  Reviewed that [facility] these items to his house and show him how to use.    Outreach call placed to Dr. Elkins Arelia Sneddonice to update PCP regarding vision issues, need for new prescriptions called into pharmacy, and request for patient records to be faxed to TH[facility].   [facility] with VanessaLorriane Shireo confirmed that the patient was seen with Dr. Elkins [name]23 after his discharge. She reports Md continued all medications from hospitalization, no changes made. She will schedule a follow up appointment with patient and place referral to opthomology.  She will fax [facility] from PCP (  fax # provided).  She will also call in refills to patient's pharmacy for him.      Plan: . Home visit scheduled for Thursday, November 5th, at 9:30AM with interpreter and [facility] RN. o Will obtain current insurance medication during home visit in case patient eligible for any medication assistance. o Will assess insulin administration technique, CBG monitoring technique, and provide BP cuff and education on how to use o Will provide [facility]N log book to record CBG and BP values o THN RN will provide nutritional education and disease state management education  [name], PharmD Candidate [place], PharmD, [place] [phone]

## 2019-08-27 NOTE — Patient Outreach (Signed)
[place] [facility]) Care Management  08/27/2019  Phillip Camacho 03-Mar-1971 [id number]   Care coordination  [facility] RN CM collaborated with Phillip Camacho staff, Phillip Camacho on the tx plan for patient [name] RN CM requested Phillip Camacho be sent a BP cuff by [facility] CMA to his home address but then with collaboration with Phillip Camacho the BP cuff will be delivered to him during a home visit scheduled by pharmacist on 08/28/19 0930  [facility] RN CM attempted to speak with staff at Dr [name] office and [place] and wellness center but each office have changed office hours related to covid pandemic and no one was reached at each office   Plan [facility] RN CM will follow up with Phillip Camacho and his daughter Phillip Camacho within 7-10 business days [facility] RN CM will collaborate with his other medical providers, [facility] pharmacy and SW prn    [name], RN, BSN, [place] Coordinator Office number [phone] Mobile number [phone]  Main [facility] number [phone] Fax number [phone]

## 2019-08-28 ENCOUNTER — Ambulatory Visit: Payer: BLUE CROSS/BLUE SHIELD | Admitting: Pharmacist

## 2019-08-28 ENCOUNTER — Other Ambulatory Visit: Payer: Self-pay | Admitting: *Deleted

## 2019-08-28 ENCOUNTER — Other Ambulatory Visit: Payer: Self-pay | Admitting: Pharmacist

## 2019-08-28 NOTE — Patient Outreach (Signed)
[facility]) Care Management  08/28/2019  Phillip Camacho [birth year] [id number]   Care coordination  [facility] RN CM spoke with Amy at Dr [name] office to discuss primary care services  Amy confirms [place] staff spoke with an office student 08/27/19 and Phillip Camacho has an appointment to see Dr [name] on today 08/28/19 at 1200 He was last seen by Dr [name] per Amy on August 15 2019 for a hospital follow up on August 15 2019. Office notes indicated if needed the ex wife (lives in [place]) had offered to assist with finding medical care in her area  [place] visit with Phillip Cejas and [facility] pharmacy and Pharmacy student on 08/28/19 [place] Lavina Hamman, RN, BSN, [place] Coordinator Office number [phone] Mobile number [phone]  Main [facility] number [phone] Fax number [phone]

## 2019-08-28 NOTE — Patient Outreach (Addendum)
[facility]) Care Management  08/28/2019  KAEDIN HORKEY 1947/01/11 [id number]   Home visit for this EMMI stroke patient in collaboration with [facility] pharmacist, student and vietnamese interpreter   Home visit completed with patient HIPAA verified [place] interpreter, Ty, present to assist Insurance : blue cross and blue shield Local with [place]- He allowed [facility] RN CM and [place] staff to review his insurance card-Customer service number [phone] [email or web address]   Medications, insulin needle sticks, BP monitoring and cbg monitoring reviewed by [facility] pharmacist Mr Gaschler at 1030 has not taken any medications He states he generally take his am medicines by 10 am but today he is scheduled to go see Dr [name] at 1200 He is noted to take only 4 units of insulin as meal time coverage Refer to [place] note   Diabetes home care reviewed by [facility] RN CM Mr Geathers was provided with the understanding Diabetes booklet and notes written in book as reminders Mr Holz reports he does not like the Relion glucose meter that he has had for a few months He reports increased pain with finger sticks ("stick to hard")  and was educated on how to decrease the level of prick and to rotate fingers [facility]N 2020 calendar provided to be able to write down his cbg and BP values  CBG today was 140 Values noted in the glucometer are 210, 269, 325, 287, 144 and 140  He continues to report some vision concerns  He was educated on how to manage hypoglycemia and hyperglycemia episodes Insulin use for hyperglycemia and protein (peanut butter, meats) and/or glucose tablets for hypoglycemia  Hyperglycemia and hypoglycemia parameter values discussed and written down for him 70-120 but preference to keep cbg lower than 200 fasting   He reports he takes his night insulin around 9 pm  He reports he use to see Dr Candiss Norse in [place] [employer] years ago  Reinforced education on  nutrition. Encouraged intake of water, teas, low sugar drinks (propel, fruit2O, hint, zero products) Pt was shown pictures of drinks and discussed where to purchase. He prefers [employer] for shopping. Discussed sugar intake for diabetic patient as 25 g or 6 teaspoons per day. Discussed protein foods and protein intake for hypoglycemia. Discussed carbohydrates, starchy foods, encourage better food choices to include brown rice and whole wheat noodles, grains etc. Encouraged in take of lean meats, vegetables and fruits. Discussed refer to [facility] SW for offer of local food resources.  Discussed the standard serving size (palm of hand). Shown and reviewed food label standards and used his can of regular Sprite as an example to calories, serving size, carbohydrates, protein, sodium and sugar contents. Chips also noted on the dining table.   Hypertension home care Mr Steighner was given a new omron BP monitor He was assisted and instructed in taking his BP by the pharmacy student BP today was 193/95 heart rate of 97 He was encouraged and he did take his am dose He inquired about hypotension home care he was referred to his [place] calendar and [facility] RN CM discussed increase of fluids to increase BP and dehydration s/s Hypotension and Hypertension parameter values discussed and written down for home < 130/80  Smoking He reports he use to spoke 2-3 packs of cigarettes but is down to "one" a week   Social:  Mr Neiswonger is a 48 year old single [place] patient who reports living alone and is independent with his care needs. 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, HTN, HLD,DM (hgA1c of 11.6), Tobacco abuse, loop recorder placed on 08/13/19  DME: Glucose meter, Omron BP monitor   Appointments:  08/26/19 Loop recorder wound site checked by  vascular RN in the office and loop recorder transmission nightly  09/04/19 He reports he is to see a primary care provider in [place]  -may be cancelled 09/17/19 [place] hospital f/u stroke  09/17/19 Nephrology initial consult Adetoye Lufadeju  Advance Directives: Denies need for assist with advance directives   Consent: [facility] RN CM reviewed [facility] services with patient. Patient gave verbal consent for services [facility] telephonic RN CM.   Advised patient that there will be further automated EMMI-post discharge calls to assess how the patient is doing following the recent hospitalization Advised the patient that another call may be received from a nurse if any of their responses were abnormal. Patient voiced understanding and was appreciative of f/u call.   Plan: [facility] RN CM will follow up with Mr Gillis and/or his daughter [name] within 7-10 business days to continue care coordination of home care for Diabetes and EMMI stroke follow up  Pt encouraged to return a call to [facility] RN CM prn  Routed to MDs  [name], RN, BSN, [place] Coordinator Office number [phone] Mobile number [phone]  Main [facility]N number [phone] Fax number [phone]

## 2019-08-28 NOTE — Patient Outreach (Signed)
[facility]) Care Management  [place]   08/28/2019  Phillip Camacho 1970-11-05 [id number]   Home visit with patient, interpreter, RN, and pharmacy student.   Subjective: -Patient reports he had another episode of blurry vision this morning upon waking up.  He reports again that it goes away after a few hours.   -Patient reports he has not checked CBG yet today and does not like the needle / lancet.  He is currently using has the Relion [employer] brand) glucometer.  -Patient reports he has not taken any medication yet today as he has PCP appt today with Dr. [name] and has blood work scheduled.  He reports that he typically takes all this oral medications in the morning with food, typically prior to 10 AM.  -Patient has [employer] through [facility].   Objective:  CBG: 140 (machine readings: 210, 269, 325, 287, 144) BP: 193/95 (prior to taking amlodipine) HR 97  Encounter Medications: Outpatient Encounter Medications as of 08/28/2019  Medication Sig  . amLODipine (NORVASC) 10 MG tablet Take 1 tablet (10 mg total) by mouth daily.  Marland Kitchen aspirin 325 MG tablet Take 1 tablet (325 mg total) by mouth daily. (Patient taking differently: Take 325 mg by mouth daily. Patient taking 4 tablets of 58m once daily)  . atorvastatin (LIPITOR) 80 MG tablet Take 1 tablet (80 mg total) by mouth daily at 6 PM.  . blood glucose meter kit and supplies KIT Dispense based on patient and insurance preference. Use up to four times daily as directed. (FOR ICD-9 250.00, 250.01).  .Marland Kitchenclopidogrel (PLAVIX) 75 MG tablet Take 1 tablet (75 mg total) by mouth daily for 21 days.  . insulin aspart (NOVOLOG) 100 UNIT/ML FlexPen Inject 4 Units into the skin 3 (three) times daily with meals.  . Insulin Glargine (LANTUS) 100 UNIT/ML Solostar Pen Inject 12 Units into the skin daily.  . nicotine (NICODERM CQ - DOSED IN MG/24 HOURS) 21 mg/24hr patch Place 1 patch (21 mg total) onto the skin daily.  (Patient not taking: Reported on 08/27/2019)   No facility-administered encounter medications on file as of 08/28/2019.     Assessment: Diabetes:  -Uncontrolled T2DM as evidenced by A1C of 11.6 due to patient non-compliance with medication (metformin) and lack of education on nutrition and non pharmacologic treatment strategies.   -Reviewed how to check CBGs with patient.  Patient able to check blood sugar on his own, denies having any questions.  Patient did express discomfort with the pain of the needle, unfortunately has to use deepest needle depth in order to draw blood.  -Incorrect insulin storage: Patient keeping all insulin pens at room temperature.  Reviewed with patient correct storage and patient voiced understanding, put unopen pens back into the fridge.  -Insulin administration: Reviewed how to correctly administer insulin with patient, including rotating sites, proper needle disposal, aseptic technique. -Provided log-book to patient to record daily CBG readings -Diabetes education: Counseled him on some diet changes that he could make, including drinks with less sugar, and switching from white to brown rice.  Also provided handout on hypo- and hyper-glycemia and reviewed how to treat low blood sugars. -Patient has f/u visit with PCP today to discuss blurry vision and medication management for diabetes / HTN.  Office may make referral for patient to opthalmology.   HTN: Uncontrolled hypertension as evidenced by consistently elevated blood pressure from both recent hospitalization and at home today (BP 193/95), likely worsened by smoking history, lack of  education on non-pharmacologic treatment.     -Reviewed importance of taking medication consistently to reduce risk of complications including another stroke.  -Provided patient with automatic blood pressure cuff.  Reviewed instructions on how to use, and to check blood pressure while sitting down with his feet flat and before any he  drinks any coffee.   -Patient took dose of amlodipine during visit.  Will have f/u with PCP in 30 minutes and will have BP re-checked.   -Reviewed goal blood pressures with patient and how to treat low BP.   -Provided patient with log-book to record values, encouraged him to always bring this to office visits with providers.   Smoking cessation: Patient stated that he was using OTC nicotine patches, but that they were only helping him slightly. He has limited his smoking to only 1 cigarette weekly.   Medication assistance: Patient currently has commercial insurance through [employer] which may have a narrow coverage window for providers and pharmacies not in [place] area.  Unfortunately, patient does not have reliable transportation to other cities for medical care and prescription coverage.  Daughter paid for patient's medications after he was discharged, total cost ~$200.    Update provided to patient's daughter, Phillip Camacho, on home visit.  Phillip Camacho is appreciative of TH[facility] efforts.  We discussed insurance.  Phillip Camacho is helping patient apply for [place] Medicaid and disability and hopefully can switch insurance plans to help patient have better coverage closer to his home in [place].  For now, he will continue to see Dr. [name] for f/u.    Plan: F/u again with patient via translator in 1-2 weeks to assess medication compliance.   Phillip Camacho, [facility] PharmD Candidate  Agree with above.   Phillip Camacho, PharmD, [place] [phone]

## 2019-08-29 ENCOUNTER — Encounter: Payer: Self-pay | Admitting: *Deleted

## 2019-09-03 ENCOUNTER — Other Ambulatory Visit: Payer: Self-pay | Admitting: *Deleted

## 2019-09-03 NOTE — Patient Outreach (Signed)
[facility]) Care Management  09/03/2019  Phillip Camacho 1971-01-24 [id number]   Follow call to EMMI stroke patient   Insurance : blue cross and blue shield Local with [facility]--Customer service number [phone] [email or web address]  THN RN CM collaborated briefly with Phillip Camacho, Phillip Camacho on 09/03/19 Encouraged contact with his daughter, Phillip Camacho to Phillip Camacho interpreter, Phillip Camacho [phone]), from [facility] language line assisted via telephone HIPAA verified  Discussed the purpose of the call to review primary care services and upcoming appointment in [place] on 09/04/19  Medicine refills Phillip Camacho informs [facility] RN CM via interpreter after CM discussed the purpose of the call that he is out of his medicine, "the pink pill"  [facility] RN CM encouraged him to find the bottle the medicine was in. He read the bottle for Plavix.   [facility] RN CM discussed the need to call Dr [name] office (MD seen on 08/28/19 for f/u) to request Rx to be sent to [facility] RN CM called Dr [name] office and spoke with Phillip Camacho about the need for scripts to be sent or called into [place].  Phillip Camacho will ask Phillip Bake, NP to assist.  [facility] RN CM reviewed this with Phillip Camacho using the interpreter. [facility] RN CM offered the [place] number to him to call prior to going to the pharmacy but he stated he will go to the pharmacy on 09/04/19  Primary care services [facility] RN CM inquired if Phillip Camacho wanted to go [facility] [place] for primary care services or if he wanted to continue to see Dr [name]. Phillip Camacho informed [facility] RN CM that he was going to the [place] MD on 09/04/19 and wanted to continue to see Dr [name]. [facility] RN CM discussed Dr [name] was found to be an Out of network MD for his insurance coverage. Phillip Camacho reports he pays Dr [name] "fifty dollars" each time he sees him but unable to afford this.  "if you know this why are you calling me?" per the interpreter [facility] RN CM attempted to explain to  Phillip Camacho via interpreter again that this [facility] RN CM was calling to attempt to assist him with primary care services in network for his insurance and locally since he reported he hd financial concerns. THN RN CM discussed the [place] and wellness center in [place].  THN RN CM provided the [place] and wellness center contact number and discussed his right to choose the primary care provider he prefers. The translator not noted to be on the call to assist with further translation. THN RN CM and Phillip Camacho unsure if translator was disconnected.   Phillip Camacho agrees to give [facility] RN CM permission to update his daughter, Phillip Camacho  [facility] RN CM called Phillip Camacho, daughter HIPAA verified Phillip Camacho was updated on the home visit, the office visit to Dr [name], and the call interaction on today Phillip Camacho voiced preference for Phillip Camacho to see an in network [employer] primary care MD he can afford vs traveling to [place] . She confirms Phillip Camacho has paid Dr [name] "fifty dollars" for each visit.  THN RN CM discussed the [place] and wellness center in [place] (income based MD, pharmacy, CM). Reviewed THN SW services, non APL services.  Again she confirms Phillip Camacho needs his green card to return to work in [employer] at the [employer], assistance with housing, finances and transportation   Post  sent an e-mail to Phillip Camacho, [name] inquiring about availability of assistance with getting pt connected with a [facility] MD/services.   Plan  [facility] RN CM will follow up with Phillip Camacho within 7-14 days business days and continue to collaborate with Phillip Camacho, [facility] pharmacy and [facility] staff  Pt encouraged to return a call to [facility] RN CM prn   Phillip Camacho L. Lavina Hamman, RN, BSN, [place] Coordinator Office number [phone] Mobile number [phone]  Main THN number [phone] Fax number [phone]

## 2019-09-04 ENCOUNTER — Encounter: Payer: Self-pay | Admitting: *Deleted

## 2019-09-04 ENCOUNTER — Other Ambulatory Visit: Payer: Self-pay | Admitting: *Deleted

## 2019-09-04 ENCOUNTER — Other Ambulatory Visit: Payer: Self-pay

## 2019-09-04 NOTE — Patient Outreach (Addendum)
[facility]) Care Management  09/04/2019  Phillip Camacho 1971/05/10 [id number]   Social Work referral received form RNCM, Joellyn Quails.  "Patient needs assistance with financial resources (unemplyed, behind in rent), housing resources (need another home/apt), support resources (caregiver resources), food resources (unable to pay for food as unemployed and is Diabetic) and transportation resources to medical appointments" It was suggested by Maudie Mercury that patient's daughter, Phillip Camacho, be contacted due to difficulty with interpreting services.  Patient has given consent for [facility] staff to talk with daughter.  Attempted to reach her today but had to leave a message.  Will attempt to reach her again tomorrow if no return call.    Addendum: Received return call from patient's daughter.  Many needs for this patient were addressed.  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  Daughter is familiar with this process due to her mother having gone through it in the past.  Per daughter, he has to attend an interview  at [employer] in [place] for this.  Daughter states that her brother could take him to appointment, however, they have been unable to get it scheduled.  Inquired about conducting a three way call with patient for purpose of scheduling.  Per daughter, she has called [employer] many times over the last several weeks and is told that patient has to be contacted by their office; patient is not able to contact them for scheduling.  Daughter was appreciative for inquiry about this issue but stated 'there is really not anything that you can do"    Financial:  Per daughter, patient is currently behind in rent by $2000. As of right now, patient has not received eviction notice from Secondary school teacher. Patient has received assistance through DSS for utility bill and his son is assisting with water bill.  Informed  daughter about  CDC Eviction Program created in response to [place].  Will send information to her about this program.  She was encouraged to contact Legal Aid to inquire further about this program.  Will also send additional financial resources.  Encouraged her to utilize [place] [address] for various resources as well.  Medicaid application has been submitted and daughter has been in communication with caseworker that is processing it.   Housing:  Informed daughter about socialserve.com to search for low income housing.  Daughter is aware that most low income properties are going to have a wait list.    Food:  Application for Food and Nutrition Services has already been submitted.  Informed her of program through [employer] that patient can utilize through end of December if approved for EBT card.  Program will allow patient to receive $40 worth of free food.  Will send [employer] [place] [employer] [place] [employer] which list all food pantries and kitchens in the [facility].    Transportation:  Per daughter, the biggest barrier for transportation is that patient's drivers license is expired.  It cannot be renewed until extension of green card is approved.  Unfortunately, patient does not qualify for SCAT services as this service is specifically for those with a physical or cognitive disability that prevents them from driving.  Patient is below the age requirement for [employer]  Utilizing the public bus system is best option at this point.  Daughter did state that patient's son can transport him to MD appointments if they are scheduled on  a day that he does not have to work.    The following will be sent to daughter via secure email. Resources are being sent to daughter as patient has difficulty reading English and resources are not available in his first language.  CDC Eviction Chartered certified accountant giving rental assistance [employer] Link to [employer].com The [place] [employer] [place] on EBT/Food [employer]  Will follow up with daughter next week but encouraged her to contact me if any questions arise before then.     Ronn Melena, BSW Social Worker [phone]

## 2019-09-04 NOTE — Patient Outreach (Signed)
[facility]) Care Management  09/04/2019  Phillip Camacho 1971/01/19 [id number]   Care coordination  Spoke with Phillip Camacho at [place] and wellness center [facility]) to get new patient appointment scheduled at [facility] based upon the present [place] cross and blue shield coverage First available appointment on October 03 2019 Friday at [place] with Dr Phillip Camacho After the first appointment he can be assisted with pharmacy, CM and SW services  Updated Phillip Camacho, Phillip Camacho on pt new appointment for primary care services as patient agreed the best preference is to speak with Phillip Camacho especially related to language barrier even with use of [place] language line service HIPAA verified Discussed that he will have access to [facility]  primary MD, SW, CM and pharmacy services after the 10/03/19 [place] appointment Provided the address and number to [facility]. Phillip Camacho will ask her brother living in [place] if the patient can come live with him. She reports previously there was "conflict" when they stayed together before" Phillip Camacho reports having her youngest brother and her Phillip Camacho with her in [place]. [facility] RN CM answered questions about housing and discussed there is a waiting list for [employer] and there may not be an immediate opening for housing for Phillip Camacho.   Phillip Camacho will check with the [place] on the cost of the medications that may have been renewed for refill by Dr Phillip Camacho office  [place] with Phillip Camacho, [place] via phone  Updated [name], Phillip Camacho  The [place] clinic appointment was cancelled for 09/04/19 1430 as requested by Phillip Camacho, Phillip Camacho [facility] RN CM spoke with Phillip Camacho at  [place] clinic [id number] to cancel the appointment  [facility] pharmacy updated on medicine refill concerns via in basket on 09/03/19 and 09/04/19  Plan [facility] RN CM will follow up with Phillip Camacho and/or his Phillip Camacho within 7-14 days business days and continue to collaborate with [place], [place] and [facility] staff prn  Pt encouraged to return a call to [place] CM prn   [name], RN, BSN, [place] Coordinator Office number [phone] Mobile number [phone]  Main THN number [phone] Fax number [phone]

## 2019-09-10 ENCOUNTER — Other Ambulatory Visit: Payer: Self-pay

## 2019-09-10 ENCOUNTER — Other Ambulatory Visit: Payer: Self-pay | Admitting: *Deleted

## 2019-09-10 NOTE — Patient Outreach (Signed)
[facility]) Care Management  09/10/2019  BRANDTLEY LINSON 05/12/1971 [id number]   Follow up for EMMI stroke vietnamese patient- Not on APL  Insurance :blue cross and blue shieldLocal with [facility]--Customer service number [phone] [email or web address]  Outreach to [employer] as Mr Kibler has given [facility] staff permission to speak with her at any time HIPAA verified (DOB and address) * [name] confirms Mr Schrack remains at his apartment on [address] and has not moved in with his son  Discussed the purpose of the call to review [facility] services and to assess for any further needs  Medications/Plavix [name] confirms Mr Arritt did pick up filled medications from his pharmacy [facility] RN CM discussed upcoming pharmacy follow up   [facility] SW/Medicaid services initiated. [name] voice appreciation of the resources. [name] discussed waiting on the medicaid services to be approved  Primary care services - Reviewed upcoming appointment to establish care at [facility]) on 112/11/20. Discussed importance of going to this scheduled appointment. Discussed options of transportation services, pharmacy services and CM/SW services after this appointment completed. [name] voiced understanding   Diabetes/nutrition- [facility] RN CM discussed found educational information on vietnamese diabetes nutritional care. [name] confirm Mr Waugh is able to read vietnamese. [name] discussed Mr Berretta may not have money to purchase for diabetic meal plan. [facility] RN CM discussed [facility] SW resources for local food assistance. [facility] RN CM sent via mail to his listed address information translated in vietnamese on Live well with type 2 diabetes, healthy meal ideas fact sheet and diabetes meal planning for the vietnamese american client  Plans [facility] RN CM will follow up with Mr Pieske or his daughter within the next 21-28 days to assess for further needs and plan for case closure if none  found   Danese Dorsainvil L. Lavina Hamman, RN, BSN, [place] Coordinator Office number [phone] Mobile number [phone]  Main [facility] number [phone] Fax number [phone]

## 2019-09-11 ENCOUNTER — Other Ambulatory Visit: Payer: Self-pay

## 2019-09-11 NOTE — Patient Outreach (Signed)
[facility]) Care Management  09/11/2019  MARQUI DEHAVEN 09-18-1971 [id number]   Follow up call to patient's daughter today as he has given consent to communicate with her as needed.  Daughter did receive resources that were sent on 09/04/19: CDC Eviction Order [employer] and Conservation officer, nature giving rental assistance [employer] Link to [employer] [place] on EBT/Food [employer] Daughter stated that she has not yet reviewed all of the information.  Encouraged her to prioritize contacting agencies providing rental assistance and Systems developer for Food and Nutrition Services  Also reminded her of food resources that were included.  Daughter is still waiting for response on Medicaid eligibility.   She still has not been contacted by USCIS regarding interview for renewal of [employer].  Daughter reports that she has contacted office many times but is not able to schedule interview; she is told they will contact her.  Will follow up with daughter again within the next two weeks.  Ronn Melena, BSW Social Worker [phone]

## 2019-09-15 ENCOUNTER — Ambulatory Visit: Payer: Self-pay | Admitting: Pharmacist

## 2019-09-15 ENCOUNTER — Other Ambulatory Visit: Payer: Self-pay

## 2019-09-15 NOTE — Patient Outreach (Addendum)
[facility]) Care Management  [place] 09/15/2019  Phillip Camacho [birth year] [id number]  Reason for call:  Following up on medication management.  Successful telephone call with daughter, Phillip Camacho.  Daughter confirmed that the patient has picked up his refills from his pharmacy and denies any issues with affording the medications.  She cannot confirm names of medications as she is [place] of [place].   She stated that he did not feel back to baseline, and that his vision was still blurry in the morning.   Informed daughter that patient will be followed by [facility] and their pharmacy services going forward.  Successful telephone call with patient and interpreter.  Patient confirmed that he is still taking amlodipine, aspirin (81mg  4 tablets daily), atorvastatin, and clopidogrel. Has good understanding of medication regimen and dosing.  He denies having any questions on how to take his medications.    He confirmed he is still taking the same prescribed dose of insulin.  Patient checks BP and BG daily. He reported his past 3 morning BP values as 179/93, 182/88, 177/90, 193/102. This is prior to taking his amlodipine.   FBG: 187, 134, 165, 158, 124, 68 (only once after home visit, which he reported remedying with something sweet).  Patient states that he only had 2 tablets left of amlodipine.  Patient stated that he had just filled his clopidogrel and that there are no longer any refills following this prescription.  He also voiced complaints of continued blurry vision throughout the whole day.  Successful telephone call to [place].  Confirmed that the refill prescription for amlodipine had just been sent in with 3 refills.  Fill would be ready for pick up today.  Total cost was $3.08 cents.  Plan:  Counseled patient to check blood pressures after taking amlodipine, and to give recorded values to upcoming doctor's visit to assist with  further medication management.    Educated patient to check blood sugars after eating something sweet during an episode of hypoglycemia.  Reminded patient of upcoming doctor's appointments.  Helped patient fill amlodipine for pick up.  Provided contact information to patient and encouraged him to call if he has any concerns or questions related to his medications or health.  Route note to PCP and [facility] for further follow up.  [name], PharmD Candidate [facility] [employer]   Agree with above.  Will route note to new PCP Cammie Fulp and other [facility] CM team (LCSW and RN).  [facility] pharmacy will sign-off but am happy to assist in the future as needed.   [name], PharmD, [place] [phone]

## 2019-09-16 ENCOUNTER — Ambulatory Visit (INDEPENDENT_AMBULATORY_CARE_PROVIDER_SITE_OTHER): Payer: BLUE CROSS/BLUE SHIELD | Admitting: *Deleted

## 2019-09-16 DIAGNOSIS — I639 Cerebral infarction, unspecified: Secondary | ICD-10-CM | POA: Diagnosis not present

## 2019-09-16 LAB — CUP PACEART REMOTE DEVICE CHECK
Date Time Interrogation Session: 20201124141243
Implantable Pulse Generator Implant Date: 20201021

## 2019-09-17 ENCOUNTER — Inpatient Hospital Stay: Payer: BLUE CROSS/BLUE SHIELD | Admitting: Adult Health

## 2019-09-17 DIAGNOSIS — N179 Acute kidney failure, unspecified: Secondary | ICD-10-CM | POA: Insufficient documentation

## 2019-09-17 DIAGNOSIS — N184 Chronic kidney disease, stage 4 (severe): Secondary | ICD-10-CM | POA: Insufficient documentation

## 2019-09-17 DIAGNOSIS — N049 Nephrotic syndrome with unspecified morphologic changes: Secondary | ICD-10-CM | POA: Insufficient documentation

## 2019-09-17 DIAGNOSIS — E1121 Type 2 diabetes mellitus with diabetic nephropathy: Secondary | ICD-10-CM

## 2019-09-17 DIAGNOSIS — Z794 Long term (current) use of insulin: Secondary | ICD-10-CM | POA: Insufficient documentation

## 2019-09-17 HISTORY — DX: Type 2 diabetes mellitus with diabetic nephropathy: E11.21

## 2019-09-19 ENCOUNTER — Other Ambulatory Visit (HOSPITAL_BASED_OUTPATIENT_CLINIC_OR_DEPARTMENT_OTHER): Payer: BLUE CROSS/BLUE SHIELD | Admitting: Family Medicine

## 2019-09-19 DIAGNOSIS — I10 Essential (primary) hypertension: Secondary | ICD-10-CM

## 2019-09-19 DIAGNOSIS — Z8673 Personal history of transient ischemic attack (TIA), and cerebral infarction without residual deficits: Secondary | ICD-10-CM

## 2019-09-19 NOTE — Progress Notes (Signed)
Patient ID: Phillip Camacho, male   DOB: 29-Sep-1971, 48 y.o.   MRN: [id number]   48 yo male s/p CVA with uncontrolled hypertension. Will refer to clinical pharmacist and have patient return as soon as possible for office follow-up as well

## 2019-09-25 ENCOUNTER — Ambulatory Visit: Payer: Self-pay

## 2019-09-25 ENCOUNTER — Encounter (HOSPITAL_COMMUNITY): Payer: Self-pay | Admitting: Cardiovascular Disease

## 2019-09-25 ENCOUNTER — Inpatient Hospital Stay: Payer: BLUE CROSS/BLUE SHIELD | Admitting: Adult Health

## 2019-09-25 NOTE — Progress Notes (Deleted)
[facility] [address] [place]. [place] [address] [phone]       HOSPITAL FOLLOW UP NOTE  Phillip. Phillip Camacho Date of Birth:  11-05-1970 Medical Record Number:  [id number]   Reason for Referral:  hospital stroke follow up    CHIEF COMPLAINT:  No chief complaint on file.   HPI: Phillip Camacho being seen today for in office hospital follow-up regarding right small cerebellar bilateral MCA/PCA, right PCA, right thalamic punctate infarcts embolic secondary to unknown source on 08/11/2019.  History obtained from *** and chart review. Reviewed all radiology images and labs personally.  Phillip Camacho is a 48 y.o. male with history of HTN, DB, tobacco [facility]usep[facility], slurred speech, drooling, L facial droop, L side weakness with L sided CP.Hedid not receive IV t-PA due as out of the window.  He was shortly transferred to [facility] for further evaluation and management.  Stroke work-up showed right small cerebellar bilateral MCA/PCA, right PCA, right thalamic punctate infarcts embolic as evidenced on MRI secondary to unknown source.  MRA head showed advanced age and arthrosclerosis, right ICA siphon 50% stenosis and right P2 moderate and P3 high-grade stenosis.  MRA neck unremarkable.  LE Doppler negative for DVT.  2D echo normal EF without cardiac source of embolus identified.  TEE negative for PFO with evidence of moderate thickness and ulcerated plaque in aortic arch.  Loop recorder placed for long-term monitoring of atrial fibrillation as potential stroke etiology.  He was seen by Dr. Erlinda Hong and recommended DAPT for 3 months then aspirin alone given severe intercranial stenosis and evidence of aortic arch arthrosclerosis which could be potential source of emboli.  Blood pressure initially elevated as high as 220/95 and initiated amlodipine 2.5 mg daily and recommended long-term BP goal 130-150 given severe intercranial  stenosis.  LDL 248 and initiated atorvastatin 80 mg daily.  Uncontrolled DM with A1c 11.6 and recommend close PCP follow-up.  History of CKD with creatinine 2.6 and ongoing follow-up with nephrology outpatient.  Current tobacco use with smoking cessation counseling provided.  No prior history of stroke.  Other active problems include leukocytosis, hypokalemia, elevated BNP and right pleural effusion.  He was discharged home in stable condition with recommendation of outpatient PT.     ROS:   14 system review of systems performed and negative with exception of ***  PMH:  Past Medical History:  Diagnosis Date  . Diabetes mellitus without complication (HCC)     PSH:  Past Surgical History:  Procedure Laterality Date  . BUBBLE STUDY  08/13/2019   Procedure: BUBBLE STUDY;  Surgeon: [name], MD;  Location: [facility];  Service: Cardiovascular;;  . LOOP RECORDER INSERTION N/A 08/13/2019   Procedure: LOOP RECORDER INSERTION;  Surgeon: [name], MD;  Location: [facility];  Service: Cardiovascular;  Laterality: N/A;  . TEE WITHOUT CARDIOVERSION N/A 08/13/2019   Procedure: TRANSESOPHAGEAL ECHOCARDIOGRAM (TEE);  Surgeon: [name], MD;  Location: [facility] ENDOSCOPY;  Service: Cardiovascular;  Laterality: N/A;  PATIENT NEEDS LOOP    Social History:  Social History   Socioeconomic History  . Marital status: Single    Spouse name: Not on file  . Number of children: 3  . Years of education: Not on file  . Highest education level: Not on file  Occupational History  . 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  Financial resource strain: Very hard  . Food insecurity    Worry: Often true    Inability: Often true  . Transportation needs    Medical: Yes    Non-medical: Yes  Tobacco Use  . Smoking status: Current Every Day Smoker  . Smokeless tobacco: Never Used  Substance and Sexual Activity  . Alcohol use: No   . Drug use: Not on file  . Sexual activity: Not on file  Lifestyle  . Physical activity    Days per week: Not on file    Minutes per session: Not on file  . Stress: Not on file  Relationships  . Social connections    Talks on phone: More than three times a week    Gets together: Once a week    Attends religious service: Never    Active member of club or organization: No    Attends meetings of clubs or organizations: Never    Relationship status: Not on file  . Intimate partner violence    Fear of current or ex partner: Not on file    Emotionally abused: Not on file    Physically abused: Not on file    Forced sexual activity: Not on file  Other Topics Concern  . Not on file  Social History Narrative   Phillip Camacho is [place] in [place] with work green card   He is able to understand some [place] translator needed at times   His Daughter Phillip Camacho is primary caregiver but lives in [place] in [place] in November 2020   He has a son living in [place] with Phillip Camacho   He has a son who lives in [place]- assists only prn   November 2020 -he is unemployed- previous job at Company secretary in [place]    Family History:  Family History  Problem Relation Age of Onset  . Other Neg Hx        patient denies any particular family medical history    Medications:   Current Outpatient Medications on File Prior to Visit  Medication Sig Dispense Refill  . amLODipine (NORVASC) 10 MG tablet Take 1 tablet (10 mg total) by mouth daily. 60 tablet 0  . aspirin 325 MG tablet Take 1 tablet (325 mg total) by mouth daily. (Patient taking differently: Take 325 mg by mouth daily. Patient taking 4 tablets of 19m once daily) 90 tablet 0  . atorvastatin (LIPITOR) 80 MG tablet Take 1 tablet (80 mg total) by mouth daily at 6 PM. 30 tablet 0  . blood glucose meter kit and supplies KIT Dispense based on patient and insurance preference. Use up to four times daily as directed. (FOR ICD-9 250.00, 250.01).  1 each 0  . clopidogrel (PLAVIX) 75 MG tablet Take 75 mg by mouth daily.    . insulin aspart (NOVOLOG) 100 UNIT/ML FlexPen Inject 4 Units into the skin 3 (three) times daily with meals. 15 mL 0  . Insulin Glargine (LANTUS) 100 UNIT/ML Solostar Pen Inject 12 Units into the skin daily. 15 mL 0  . nicotine (NICODERM CQ - DOSED IN MG/24 HOURS) 21 mg/24hr patch Place 1 patch (21 mg total) onto the skin daily. 28 patch 0   No current facility-administered medications on file prior to visit.     Allergies:  No Known Allergies   Physical Exam  There were no vitals filed for this visit. There is no height or weight on file to calculate BMI. No exam data  present  Depression screen [facility] 2/9 08/25/2019  Decreased Interest 0  Down, Depressed, Hopeless 0  PHQ - 2 Score 0     General: well developed, well nourished, seated, in no evident distress Head: head normocephalic and atraumatic.   Neck: supple with no carotid or supraclavicular bruits Cardiovascular: regular rate and rhythm, no murmurs Musculoskeletal: no deformity Skin:  no rash/petichiae Vascular:  Normal pulses all extremities   Neurologic Exam Mental Status: Awake and fully alert. Oriented to place and time. Recent and remote memory intact. Attention span, concentration and fund of knowledge appropriate. Mood and affect appropriate.  Cranial Nerves: Fundoscopic exam reveals sharp disc margins. Pupils equal, briskly reactive to light. Extraocular movements full without nystagmus. Visual fields full to confrontation. Hearing intact. Facial sensation intact. Face, tongue, palate moves normally and symmetrically.  Motor: Normal bulk and tone. Normal strength in all tested extremity muscles. Sensory.: intact to touch , pinprick , position and vibratory sensation.  Coordination: Rapid alternating movements normal in all extremities. Finger-to-nose and heel-to-shin performed accurately bilaterally. Gait and Station: Arises from chair  without difficulty. Stance is normal. Gait demonstrates normal stride length and balance Reflexes: 1+ and symmetric. Toes downgoing.     NIHSS  *** Modified Rankin  *** CHA2DS2-VASc *** HAS-BLED ***   Diagnostic Data (Labs, Imaging, Testing)  CT HEAD WO CONTRAST 08/11/2019 IMPRESSION: Age uncertain and potentially recent infarct in the posteromedial aspect of the dentate nucleus of the right cerebellum. No other parenchymal abnormality to suggest infarct noted. No mass or hemorrhage. There are foci of arterial vascular calcification. There is mucosal thickening in several ethmoid air cells. There is rightward deviation of the nasal septum.  Phillip BRAIN WO CONTRAST 08/11/2019 IMPRESSION: 1. Small acute infarcts in the right cerebellum and Camacho cerebral hemispheres. 2. Mild chronic small vessel ischemic disease.  Phillip MRA HEAD  Phillip MRA NECK 08/12/2019 IMPRESSION: Age advanced intracranial atherosclerosis with affected sites described above.  ECHOCARDIOGRAM 08/12/2019 IMPRESSIONS  1. Left ventricular ejection fraction, by visual estimation, is 65 to 70%. The left ventricle has normal function. Normal left ventricular size. There is no left ventricular hypertrophy.  2. Elevated left ventricular end-diastolic pressure.  3. Left ventricular diastolic Doppler parameters are consistent with pseudonormalization pattern of LV diastolic filling.  4. Global right ventricle has normal systolic function.The right ventricular size is normal. No increase in right ventricular wall thickness.  5. Left atrial size was mildly dilated.  6. Right atrial size was normal.  7. The mitral valve is normal in structure. Mild mitral valve regurgitation. No evidence of mitral stenosis.  8. The tricuspid valve is normal in structure. Tricuspid valve regurgitation is mild.  9. The aortic valve is tricuspid Aortic valve regurgitation was not visualized by color flow Doppler. Mild aortic valve sclerosis  without stenosis. 10. The pulmonic valve was normal in structure. Pulmonic valve regurgitation is trivial by color flow Doppler. 11. Mildly elevated pulmonary artery systolic pressure. 12. The inferior vena cava is normal in size with greater than 50% respiratory variability, suggesting right atrial pressure of 3 mmHg.  ECHO TEE 08/13/2019 IMPRESSIONS  1. Left ventricular ejection fraction, by visual estimation, is 60 to 65%. The left ventricle has normal function. Normal left ventricular size. There is mildly increased left ventricular hypertrophy.  2. Elevated mean left atrial pressure.  3. Left ventricular diastolic Doppler parameters are consistent with pseudonormalization pattern of LV diastolic filling.  4. Global right ventricle has normal systolic function.The right ventricular size is normal. No increase  in right ventricular wall thickness.  5. Left atrial size was normal.  6. Right atrial size was normal.  7. The mitral valve is normal in structure. No evidence of mitral valve regurgitation. No evidence of mitral stenosis.  8. The tricuspid valve is normal in structure. Tricuspid valve regurgitation was not visualized by color flow Doppler.  9. The aortic valve is normal in structure. Aortic valve regurgitation was not visualized by color flow Doppler. Structurally normal aortic valve, with no evidence of sclerosis or stenosis. 10. The pulmonic valve was normal in structure. Pulmonic valve regurgitation is not visualized by color flow Doppler. 11. Moderate plaque invoving the transverse aorta. 12. Mildly elevated pulmonary artery systolic pressure. 13. The inferior vena cava is normal in size with greater than 50% respiratory variability, suggesting right atrial pressure of 3 mmHg.     ASSESSMENT: Phillip Camacho is a 48 y.o. year old male presented with confusion, slurred speech, drooling, left facial droop and left-sided weakness on 08/11/2019 with stroke work-up showing right  small cerebellar and bilateral MCA/PCA, right PCA and right thalamic punctate infarcts embolic pattern secondary to unknown source.  Loop recorder placed for evaluation of potential atrial fibrillation as stroke etiology.  He also had evidence of aortic arch arthrosclerosis which was felt could be potential source of emboli therefore recommended DAPT for 3 months. Vascular risk factors include HTN, HLD, uncontrolled DM and tobacco use.     PLAN:  1. Embolic stroke: Continue {anticoagulants:31417}  and ***  for secondary stroke prevention. Maintain strict control of hypertension with blood pressure goal below 130/90, diabetes with hemoglobin A1c goal below 6.5% and cholesterol with LDL cholesterol (bad cholesterol) goal below 70 mg/dL.  I also advised the patient to eat a healthy diet with plenty of whole grains, cereals, fruits and vegetables, exercise regularly with at least 30 minutes of continuous activity daily and maintain ideal body weight. 2. HTN: Advised to continue current treatment regimen.  Today's BP ***.  Advised to continue to monitor at home along with continued follow-up with PCP for management 3. HLD: Advised to continue current treatment regimen along with continued follow-up with PCP for future prescribing and monitoring of lipid panel 4. DMII: Advised to continue to monitor glucose levels at home along with continued follow-up with PCP for management and monitoring    Follow up in *** or call earlier if needed   Greater than 50% of time during this 45 minute visit was spent on counseling, explanation of diagnosis of ***, reviewing risk factor management of ***, planning of further management along with potential future management, and discussion with patient and family answering all questions.    Phillip Camacho, AGNP-BC  [facility] [address] [place], [place] [address]  Phone [phone] Fax [phone] Note: This document was  prepared with digital dictation and possible smart phrase technology. Any transcriptional errors that result from this process are unintentional.

## 2019-09-26 ENCOUNTER — Other Ambulatory Visit: Payer: Self-pay | Admitting: *Deleted

## 2019-09-26 ENCOUNTER — Other Ambulatory Visit: Payer: Self-pay

## 2019-09-26 NOTE — Patient Outreach (Signed)
[place] [facility]) Care Management  09/26/2019  Phillip Camacho [birth year] [id number]   Care coordination  [facility] RN CM collaborated with [place], [place] related to Phillip Camacho services Pending interventions from [place], daughter   Plans [facility] RN CM will follow up with Phillip Mosey or his daughter within the next 14-21 days to assess for further needs and plan for case closure if none found   Kimberly L. Lavina Hamman, RN, BSN, [place] Coordinator Office number [phone] Mobile number [phone]  Main [facility] number [phone] Fax number [phone]

## 2019-09-26 NOTE — Patient Outreach (Signed)
[facility]) Care Management  09/26/2019  Phillip Camacho 03-07-1971 [id number]   Follow up call to patient's daughter today.   Daughter unsure of status of Medicaid application that was submitted in late October.  Encouraged her to contact DSS; confirmed that she has contact information for caseworker.  Daughter's biggest concern at this point is status of patient's green card.  During previous calls, daughter reported that she has contacted USCIS multiple times and was told that patient needed to come in for an interview.  Per daughter, she was not able to schedule the interview during these calls and was told that she would be contacted by a representative.  During call today, daughter reported that she was contacted by USCIS on 09/11/19 but was told that patient was issued an extended green card in December 2019.  Daughter informed representative that patient did not receive this card. Per daughter, she was told that she/patient need to obtain proof from [employer] [place] [employer] that patient did not receive card.  As of today, daughter has not attempted to contact post office.  She was encouraged to contact post office ASAP. Daughter received a "Termination Notice"(dated 09/11/19) from [place] and asked if she could forward it to me for review.  After reviewing the letter, it seems that an appeal can be submitted via uscis.gov by completing an I-290B form.  Daughter has not completed form.  Reviewed site and informed daughter of how to access form; encouraged her to complete ASAP.  Per letter, appeal has to be submitted within 33 days of date on letter.   Unfortunately, this writer is not knowledgeable of green card process. Collaborated with CSW, Nat Christen, about potential resource for daughter/patient to get assistance with this process. Di Kindle located number for [employer] and Scientific laboratory technician through Department of [employer].  Called daughter back and provided her with  contact information for this service. During this call, daughter reported that she contacted the post office and received an automated message about how to submit "a ticket" for assistance. She also left a message for caseworker with DSS.   Will follow up with daughter again next week to determine if she was able to get assistance through ombudsman service.  Ronn Melena, BSW Social Worker [phone]

## 2019-09-29 ENCOUNTER — Inpatient Hospital Stay (HOSPITAL_COMMUNITY)
Admission: EM | Admit: 2019-09-29 | Discharge: 2019-10-02 | DRG: 065 | Disposition: A | Discharge status: Home Health | Payer: BLUE CROSS/BLUE SHIELD | Attending: Student in an Organized Health Care Education/Training Program | Admitting: Student in an Organized Health Care Education/Training Program

## 2019-09-29 ENCOUNTER — Encounter (HOSPITAL_COMMUNITY): Payer: Self-pay | Admitting: Emergency Medicine

## 2019-09-29 DIAGNOSIS — Z79899 Other long term (current) drug therapy: Secondary | ICD-10-CM

## 2019-09-29 DIAGNOSIS — E11319 Type 2 diabetes mellitus with unspecified diabetic retinopathy without macular edema: Secondary | ICD-10-CM | POA: Diagnosis present

## 2019-09-29 DIAGNOSIS — E119 Type 2 diabetes mellitus without complications: Secondary | ICD-10-CM

## 2019-09-29 DIAGNOSIS — Z794 Long term (current) use of insulin: Secondary | ICD-10-CM

## 2019-09-29 DIAGNOSIS — H547 Unspecified visual loss: Secondary | ICD-10-CM | POA: Diagnosis not present

## 2019-09-29 DIAGNOSIS — I6340 Cerebral infarction due to embolism of unspecified cerebral artery: Principal | ICD-10-CM | POA: Diagnosis present

## 2019-09-29 DIAGNOSIS — N1832 Chronic kidney disease, stage 3b: Secondary | ICD-10-CM | POA: Diagnosis present

## 2019-09-29 DIAGNOSIS — I6310 Cerebral infarction due to embolism of unspecified precerebral artery: Secondary | ICD-10-CM

## 2019-09-29 DIAGNOSIS — R29700 NIHSS score 0: Secondary | ICD-10-CM | POA: Diagnosis present

## 2019-09-29 DIAGNOSIS — I639 Cerebral infarction, unspecified: Secondary | ICD-10-CM | POA: Diagnosis present

## 2019-09-29 DIAGNOSIS — E1151 Type 2 diabetes mellitus with diabetic peripheral angiopathy without gangrene: Secondary | ICD-10-CM | POA: Diagnosis present

## 2019-09-29 DIAGNOSIS — H4050X0 Glaucoma secondary to other eye disorders, unspecified eye, stage unspecified: Secondary | ICD-10-CM | POA: Diagnosis present

## 2019-09-29 DIAGNOSIS — Z20828 Contact with and (suspected) exposure to other viral communicable diseases: Secondary | ICD-10-CM | POA: Diagnosis present

## 2019-09-29 DIAGNOSIS — Z7902 Long term (current) use of antithrombotics/antiplatelets: Secondary | ICD-10-CM

## 2019-09-29 DIAGNOSIS — Z7982 Long term (current) use of aspirin: Secondary | ICD-10-CM

## 2019-09-29 DIAGNOSIS — E1165 Type 2 diabetes mellitus with hyperglycemia: Secondary | ICD-10-CM | POA: Diagnosis present

## 2019-09-29 DIAGNOSIS — N183 Chronic kidney disease, stage 3 unspecified: Secondary | ICD-10-CM | POA: Diagnosis present

## 2019-09-29 DIAGNOSIS — E1122 Type 2 diabetes mellitus with diabetic chronic kidney disease: Secondary | ICD-10-CM | POA: Diagnosis present

## 2019-09-29 DIAGNOSIS — E11649 Type 2 diabetes mellitus with hypoglycemia without coma: Secondary | ICD-10-CM | POA: Diagnosis not present

## 2019-09-29 DIAGNOSIS — E785 Hyperlipidemia, unspecified: Secondary | ICD-10-CM | POA: Diagnosis present

## 2019-09-29 DIAGNOSIS — F17210 Nicotine dependence, cigarettes, uncomplicated: Secondary | ICD-10-CM | POA: Diagnosis present

## 2019-09-29 DIAGNOSIS — H3410 Central retinal artery occlusion, unspecified eye: Secondary | ICD-10-CM | POA: Diagnosis present

## 2019-09-29 DIAGNOSIS — I129 Hypertensive chronic kidney disease with stage 1 through stage 4 chronic kidney disease, or unspecified chronic kidney disease: Secondary | ICD-10-CM | POA: Diagnosis present

## 2019-09-29 DIAGNOSIS — I10 Essential (primary) hypertension: Secondary | ICD-10-CM

## 2019-09-29 DIAGNOSIS — H3411 Central retinal artery occlusion, right eye: Secondary | ICD-10-CM | POA: Diagnosis present

## 2019-09-29 HISTORY — DX: Cerebral infarction, unspecified: I63.9

## 2019-09-29 LAB — CBC WITH DIFFERENTIAL/PLATELET
Abs Immature Granulocytes: 0.04 10*3/uL (ref 0.00–0.07)
Basophils Absolute: 0.1 10*3/uL (ref 0.0–0.1)
Basophils Relative: 1 %
Eosinophils Absolute: 0.6 10*3/uL — ABNORMAL HIGH (ref 0.0–0.5)
Eosinophils Relative: 6 %
HCT: 37.6 % — ABNORMAL LOW (ref 39.0–52.0)
Hemoglobin: 11.6 g/dL — ABNORMAL LOW (ref 13.0–17.0)
Immature Granulocytes: 0 %
Lymphocytes Relative: 19 %
Lymphs Abs: 1.9 10*3/uL (ref 0.7–4.0)
MCH: 27 pg (ref 26.0–34.0)
MCHC: 30.9 g/dL (ref 30.0–36.0)
MCV: 87.4 fL (ref 80.0–100.0)
Monocytes Absolute: 0.5 10*3/uL (ref 0.1–1.0)
Monocytes Relative: 5 %
Neutro Abs: 6.8 10*3/uL (ref 1.7–7.7)
Neutrophils Relative %: 69 %
Platelets: 641 10*3/uL — ABNORMAL HIGH (ref 150–400)
RBC: 4.3 MIL/uL (ref 4.22–5.81)
RDW: 13.8 % (ref 11.5–15.5)
WBC: 9.9 10*3/uL (ref 4.0–10.5)
nRBC: 0 % (ref 0.0–0.2)

## 2019-09-29 LAB — COMPREHENSIVE METABOLIC PANEL
ALT: 13 U/L (ref 0–44)
AST: 11 U/L — ABNORMAL LOW (ref 15–41)
Albumin: 2.2 g/dL — ABNORMAL LOW (ref 3.5–5.0)
Alkaline Phosphatase: 105 U/L (ref 38–126)
Anion gap: 6 (ref 5–15)
BUN: 30 mg/dL — ABNORMAL HIGH (ref 6–20)
CO2: 24 mmol/L (ref 22–32)
Calcium: 8.5 mg/dL — ABNORMAL LOW (ref 8.9–10.3)
Chloride: 108 mmol/L (ref 98–111)
Creatinine, Ser: 3.77 mg/dL — ABNORMAL HIGH (ref 0.61–1.24)
GFR calc Af Amer: 21 mL/min — ABNORMAL LOW (ref >60)
GFR calc non Af Amer: 18 mL/min — ABNORMAL LOW (ref >60)
Glucose, Bld: 241 mg/dL — ABNORMAL HIGH (ref 70–99)
Potassium: 4 mmol/L (ref 3.5–5.1)
Sodium: 138 mmol/L (ref 135–145)
Total Bilirubin: 0.1 mg/dL — ABNORMAL LOW (ref 0.3–1.2)
Total Protein: 6.8 g/dL (ref 6.5–8.1)

## 2019-09-29 LAB — PROTIME-INR
INR: 0.9 (ref 0.8–1.2)
Prothrombin Time: 12.1 seconds (ref 11.4–15.2)

## 2019-09-29 NOTE — ED Triage Notes (Signed)
Pt was sent to ER due to not having any vision in right eye for 3 days - pt states is completely black- pt arrives with images of eye.    hx of stroke 3 weeks ago.

## 2019-09-30 ENCOUNTER — Encounter: Payer: Self-pay | Admitting: Adult Health

## 2019-09-30 ENCOUNTER — Inpatient Hospital Stay (HOSPITAL_COMMUNITY): Payer: BLUE CROSS/BLUE SHIELD

## 2019-09-30 ENCOUNTER — Emergency Department (HOSPITAL_COMMUNITY): Payer: BLUE CROSS/BLUE SHIELD

## 2019-09-30 DIAGNOSIS — E1139 Type 2 diabetes mellitus with other diabetic ophthalmic complication: Secondary | ICD-10-CM | POA: Diagnosis not present

## 2019-09-30 DIAGNOSIS — Z20828 Contact with and (suspected) exposure to other viral communicable diseases: Secondary | ICD-10-CM | POA: Diagnosis present

## 2019-09-30 DIAGNOSIS — I69339 Monoplegia of upper limb following cerebral infarction affecting unspecified side: Secondary | ICD-10-CM | POA: Diagnosis not present

## 2019-09-30 DIAGNOSIS — H4089 Other specified glaucoma: Secondary | ICD-10-CM

## 2019-09-30 DIAGNOSIS — E1151 Type 2 diabetes mellitus with diabetic peripheral angiopathy without gangrene: Secondary | ICD-10-CM | POA: Diagnosis present

## 2019-09-30 DIAGNOSIS — H547 Unspecified visual loss: Secondary | ICD-10-CM | POA: Diagnosis present

## 2019-09-30 DIAGNOSIS — F17210 Nicotine dependence, cigarettes, uncomplicated: Secondary | ICD-10-CM | POA: Diagnosis present

## 2019-09-30 DIAGNOSIS — E11649 Type 2 diabetes mellitus with hypoglycemia without coma: Secondary | ICD-10-CM | POA: Diagnosis not present

## 2019-09-30 DIAGNOSIS — I999 Unspecified disorder of circulatory system: Secondary | ICD-10-CM

## 2019-09-30 DIAGNOSIS — E785 Hyperlipidemia, unspecified: Secondary | ICD-10-CM | POA: Diagnosis present

## 2019-09-30 DIAGNOSIS — I6340 Cerebral infarction due to embolism of unspecified cerebral artery: Secondary | ICD-10-CM | POA: Diagnosis present

## 2019-09-30 DIAGNOSIS — H3410 Central retinal artery occlusion, unspecified eye: Secondary | ICD-10-CM | POA: Diagnosis not present

## 2019-09-30 DIAGNOSIS — I129 Hypertensive chronic kidney disease with stage 1 through stage 4 chronic kidney disease, or unspecified chronic kidney disease: Secondary | ICD-10-CM

## 2019-09-30 DIAGNOSIS — Z79899 Other long term (current) drug therapy: Secondary | ICD-10-CM

## 2019-09-30 DIAGNOSIS — I69322 Dysarthria following cerebral infarction: Secondary | ICD-10-CM | POA: Diagnosis not present

## 2019-09-30 DIAGNOSIS — H3411 Central retinal artery occlusion, right eye: Secondary | ICD-10-CM | POA: Diagnosis present

## 2019-09-30 DIAGNOSIS — R451 Restlessness and agitation: Secondary | ICD-10-CM

## 2019-09-30 DIAGNOSIS — Z720 Tobacco use: Secondary | ICD-10-CM

## 2019-09-30 DIAGNOSIS — E118 Type 2 diabetes mellitus with unspecified complications: Secondary | ICD-10-CM | POA: Diagnosis not present

## 2019-09-30 DIAGNOSIS — I6529 Occlusion and stenosis of unspecified carotid artery: Secondary | ICD-10-CM

## 2019-09-30 DIAGNOSIS — R29700 NIHSS score 0: Secondary | ICD-10-CM | POA: Diagnosis present

## 2019-09-30 DIAGNOSIS — N1832 Chronic kidney disease, stage 3b: Secondary | ICD-10-CM

## 2019-09-30 DIAGNOSIS — I639 Cerebral infarction, unspecified: Secondary | ICD-10-CM

## 2019-09-30 DIAGNOSIS — Z7982 Long term (current) use of aspirin: Secondary | ICD-10-CM | POA: Diagnosis not present

## 2019-09-30 DIAGNOSIS — Z7902 Long term (current) use of antithrombotics/antiplatelets: Secondary | ICD-10-CM | POA: Diagnosis not present

## 2019-09-30 DIAGNOSIS — H4050X0 Glaucoma secondary to other eye disorders, unspecified eye, stage unspecified: Secondary | ICD-10-CM | POA: Diagnosis present

## 2019-09-30 DIAGNOSIS — E1165 Type 2 diabetes mellitus with hyperglycemia: Secondary | ICD-10-CM | POA: Diagnosis present

## 2019-09-30 DIAGNOSIS — E1122 Type 2 diabetes mellitus with diabetic chronic kidney disease: Secondary | ICD-10-CM | POA: Diagnosis present

## 2019-09-30 DIAGNOSIS — H42 Glaucoma in diseases classified elsewhere: Secondary | ICD-10-CM

## 2019-09-30 DIAGNOSIS — Z794 Long term (current) use of insulin: Secondary | ICD-10-CM

## 2019-09-30 DIAGNOSIS — I708 Atherosclerosis of other arteries: Secondary | ICD-10-CM | POA: Diagnosis not present

## 2019-09-30 DIAGNOSIS — E11319 Type 2 diabetes mellitus with unspecified diabetic retinopathy without macular edema: Secondary | ICD-10-CM | POA: Diagnosis present

## 2019-09-30 LAB — I-STAT CHEM 8, ED
BUN: 29 mg/dL — ABNORMAL HIGH (ref 6–20)
Calcium, Ion: 1.19 mmol/L (ref 1.15–1.40)
Chloride: 110 mmol/L (ref 98–111)
Creatinine, Ser: 3.8 mg/dL — ABNORMAL HIGH (ref 0.61–1.24)
Glucose, Bld: 120 mg/dL — ABNORMAL HIGH (ref 70–99)
HCT: 35 % — ABNORMAL LOW (ref 39.0–52.0)
Hemoglobin: 11.9 g/dL — ABNORMAL LOW (ref 13.0–17.0)
Potassium: 4.3 mmol/L (ref 3.5–5.1)
Sodium: 140 mmol/L (ref 135–145)
TCO2: 25 mmol/L (ref 22–32)

## 2019-09-30 LAB — URINALYSIS, ROUTINE W REFLEX MICROSCOPIC
Bilirubin Urine: NEGATIVE
Glucose, UA: 150 mg/dL — AB
Hgb urine dipstick: NEGATIVE
Ketones, ur: NEGATIVE mg/dL
Leukocytes,Ua: NEGATIVE
Nitrite: NEGATIVE
Protein, ur: 100 mg/dL — AB
Specific Gravity, Urine: 1.017 (ref 1.005–1.030)
pH: 6 (ref 5.0–8.0)

## 2019-09-30 LAB — ETHANOL: Alcohol, Ethyl (B): <10 mg/dL (ref <10)

## 2019-09-30 LAB — SARS CORONAVIRUS 2 (TAT 6-24 HRS): SARS Coronavirus 2: NEGATIVE

## 2019-09-30 LAB — RAPID URINE DRUG SCREEN, HOSP PERFORMED
Amphetamines: NOT DETECTED
Barbiturates: NOT DETECTED
Benzodiazepines: NOT DETECTED
Cocaine: NOT DETECTED
Opiates: NOT DETECTED
Tetrahydrocannabinol: NOT DETECTED

## 2019-09-30 LAB — CBG MONITORING, ED
Glucose-Capillary: 135 mg/dL — ABNORMAL HIGH (ref 70–99)
Glucose-Capillary: 182 mg/dL — ABNORMAL HIGH (ref 70–99)
Glucose-Capillary: 151 mg/dL — ABNORMAL HIGH (ref 70–99)

## 2019-09-30 LAB — APTT: aPTT: 34 seconds (ref 24–36)

## 2019-09-30 MED ORDER — INSULIN GLARGINE 100 UNIT/ML ~~LOC~~ SOLN
8.0000 [IU] | Freq: Every day | SUBCUTANEOUS | Status: DC
  Administered 2019-09-30 – 2019-10-01 (×2): 8 [IU] via SUBCUTANEOUS
  Filled 2019-09-30 (×4): qty 0.08

## 2019-09-30 MED ORDER — ACETAMINOPHEN 650 MG RE SUPP: 650.0000 mg | RECTAL | Status: DC | PRN

## 2019-09-30 MED ORDER — AMLODIPINE BESYLATE 5 MG PO TABS
10.0000 mg | ORAL_TABLET | Freq: Once | ORAL | Status: AC
  Administered 2019-09-30: 10 mg via ORAL
  Filled 2019-09-30: qty 2

## 2019-09-30 MED ORDER — HYDROMORPHONE HCL 1 MG/ML IJ SOLN
0.5000 mg | INTRAMUSCULAR | Status: DC | PRN
  Administered 2019-10-02: 0.5 mg via INTRAVENOUS
  Filled 2019-09-30: qty 0.5

## 2019-09-30 MED ORDER — TETRACAINE HCL 0.5 % OP SOLN
2.0000 [drp] | Freq: Two times a day (BID) | OPHTHALMIC | Status: DC | PRN
  Filled 2019-09-30: qty 4

## 2019-09-30 MED ORDER — ASPIRIN 325 MG PO TABS
325.0000 mg | ORAL_TABLET | Freq: Every day | ORAL | Status: DC
  Administered 2019-10-01: 325 mg via ORAL
  Filled 2019-09-30: qty 1

## 2019-09-30 MED ORDER — INSULIN ASPART 100 UNIT/ML ~~LOC~~ SOLN: 0.0000 [IU] | Freq: Every day | SUBCUTANEOUS | Status: DC

## 2019-09-30 MED ORDER — INSULIN ASPART 100 UNIT/ML ~~LOC~~ SOLN: 0.0000 [IU] | Freq: Three times a day (TID) | SUBCUTANEOUS | Status: DC

## 2019-09-30 MED ORDER — HYDROMORPHONE HCL 1 MG/ML IJ SOLN: 1.0000 mg | INTRAMUSCULAR | Status: DC | PRN

## 2019-09-30 MED ORDER — ACETAMINOPHEN 325 MG PO TABS
650.0000 mg | ORAL_TABLET | ORAL | Status: DC | PRN
  Administered 2019-10-01 – 2019-10-02 (×3): 650 mg via ORAL
  Filled 2019-09-30 (×3): qty 2

## 2019-09-30 MED ORDER — ACETAMINOPHEN 160 MG/5ML PO SOLN: 650.0000 mg | ORAL | Status: DC | PRN

## 2019-09-30 MED ORDER — ENOXAPARIN SODIUM 30 MG/0.3ML ~~LOC~~ SOLN
30.0000 mg | SUBCUTANEOUS | Status: DC
  Administered 2019-10-01 (×2): 30 mg via SUBCUTANEOUS
  Filled 2019-09-30 (×3): qty 0.3

## 2019-09-30 MED ORDER — AMLODIPINE BESYLATE 10 MG PO TABS
10.0000 mg | ORAL_TABLET | Freq: Every day | ORAL | Status: DC
  Administered 2019-10-01 – 2019-10-02 (×2): 10 mg via ORAL
  Filled 2019-09-30 (×2): qty 1

## 2019-09-30 MED ORDER — SENNOSIDES-DOCUSATE SODIUM 8.6-50 MG PO TABS: 1.0000 | ORAL_TABLET | Freq: Every evening | ORAL | Status: DC | PRN

## 2019-09-30 MED ORDER — TETRACAINE HCL 0.5 % OP SOLN
2.0000 [drp] | Freq: Once | OPHTHALMIC | Status: AC
  Administered 2019-09-30: 2 [drp] via OPHTHALMIC
  Filled 2019-09-30: qty 8

## 2019-09-30 NOTE — ED Provider Notes (Addendum)
[facility] EMERGENCY DEPARTMENT Provider Note   CSN: [id number] Arrival date & time: 09/29/19  1719     History   Chief Complaint Chief Complaint  Patient presents with  . Eye Problem    HPI Phillip Camacho is a 48 y.o. male.     HPI   He presents for vision loss in right eye.  Hospitalized 7 weeks ago with hypertensive urgency, and acute CVA.  Noted at that time to have an ulcerated aortic arch plaque.  He was started on aspirin and Plavix.  Norvasc was started to control blood pressure.  He saw nephrology, 09/17/2019, regarding acute on chronic renal insufficiency, and nephrotic syndrome.  He was started on losartan.  Plan follow-up with renal ultrasound and office appointment about 4 weeks from now.  He states that he saw Dr. Franne Grip from [facility] ophthalmology, yesterday, who directed him to come to the emergency department.  A follow-up appointment was scheduled with Dr. San Jetty for 10/13/2019.  The patient reports painful loss of vision for 3 days, initially the vision loss was gradual, blurry only, but rapidly progressed to complete vision loss.  He denies other changes.  He is able to eat, drink, walk, and has no shortness of breath, cough or weakness.  He is taking his usual medications.  There are no other known modifying factors.  Past Medical History:  Diagnosis Date  . Diabetes mellitus without complication ([place])   . Stroke [facility])     Patient Active Problem List   Diagnosis Date Noted  . Acute CVA (cerebrovascular accident) ([place]) 08/11/2019  . DM (diabetes mellitus) ([place]) 08/11/2019  . Essential hypertension 08/11/2019  . Tobacco abuse 08/11/2019    Past Surgical History:  Procedure Laterality Date  . BUBBLE STUDY  08/13/2019   Procedure: BUBBLE STUDY;  Surgeon: [name], MD;  Location: [facility] ENDOSCOPY;  Service: Cardiovascular;;  . LOOP RECORDER INSERTION N/A 08/13/2019   Procedure: LOOP RECORDER INSERTION;  Surgeon: [name], MD;   Location: [facility] CV LAB;  Service: Cardiovascular;  Laterality: N/A;  . TEE WITHOUT CARDIOVERSION N/A 08/13/2019   Procedure: TRANSESOPHAGEAL ECHOCARDIOGRAM (TEE);  Surgeon: [name], MD;  Location: [facility];  Service: Cardiovascular;  Laterality: N/A;  PATIENT NEEDS LOOP        Home Medications    Prior to Admission medications   Medication Sig Start Date End Date Taking? Authorizing Provider  amLODipine (NORVASC) 10 MG tablet Take 1 tablet (10 mg total) by mouth daily. 08/15/19   [name], DO  aspirin 325 MG tablet Take 1 tablet (325 mg total) by mouth daily. Patient taking differently: Take 325 mg by mouth daily. Patient taking 4 tablets of 27m once daily 08/15/19 11/13/19  HKayleen Memos DO  atorvastatin (LIPITOR) 80 MG tablet Take 1 tablet (80 mg total) by mouth daily at 6 PM. 08/14/19   HKayleen Memos DO  blood glucose meter kit and supplies KIT Dispense based on patient and insurance preference. Use up to four times daily as directed. (FOR ICD-9 250.00, 250.01). 08/14/19   HKayleen Memos DO  clopidogrel (PLAVIX) 75 MG tablet Take 75 mg by mouth daily.    [provider]  insulin aspart (NOVOLOG) 100 UNIT/ML FlexPen Inject 4 Units into the skin 3 (three) times daily with meals. 08/14/19   HKayleen Memos DO  Insulin Glargine (LANTUS) 100 UNIT/ML Solostar Pen Inject 12 Units into the skin daily. 08/14/19   HKayleen Memos DO  nicotine (NICODERM CQ - DOSED IN MG/24 HOURS) 21 mg/24hr patch Place 1 patch (21 mg total) onto the skin daily. 08/15/19   [name], DO    Family History Family History  Problem Relation Age of Onset  . Other Neg Hx        patient denies any particular family medical history    Social History Social History   Tobacco Use  . Smoking status: Current Every Day Smoker  . Smokeless tobacco: Never Used  Substance Use Topics  . Alcohol use: No  . Drug use: Not on file     Allergies   Patient has no known allergies.    Review of Systems Review of Systems  All other systems reviewed and are negative.    Physical Exam Updated Vital Signs BP (!) 192/95   Pulse 96   Temp 98.5 F (36.9 C) (Oral)   Resp 17   SpO2 100%   Physical Exam Vitals signs and nursing note reviewed.  Constitutional:      General: He is not in acute distress.    Appearance: He is well-developed. He is not ill-appearing, toxic-appearing or diaphoretic.  HENT:     Head: Normocephalic and atraumatic.     Right Ear: External ear normal.     Left Ear: External ear normal.     Nose: No congestion.     Mouth/Throat:     Pharynx: No oropharyngeal exudate or posterior oropharyngeal erythema.  Eyes:     Conjunctiva/sclera: Conjunctivae normal.     Comments: Right eye 6 x 7 mm, irregular shaped, somewhat oval with decreased light responsiveness, left eye 4 mm normal responsiveness to light.  Right eye diffusely red with some redness and swelling of the right eyelid.  External ocular muscles are intact.  Neck:     Musculoskeletal: Normal range of motion and neck supple.     Trachea: Phonation normal.  Cardiovascular:     Rate and Rhythm: Normal rate and regular rhythm.     Heart sounds: Normal heart sounds.  Pulmonary:     Effort: Pulmonary effort is normal.     Breath sounds: Normal breath sounds.  Abdominal:     Palpations: Abdomen is soft.     Tenderness: There is no abdominal tenderness.  Musculoskeletal: Normal range of motion.  Skin:    General: Skin is warm and dry.  Neurological:     Mental Status: He is alert and oriented to person, place, and time.     Cranial Nerves: No cranial nerve deficit.     Sensory: No sensory deficit.     Motor: No abnormal muscle tone.     Coordination: Coordination normal.  Psychiatric:        Mood and Affect: Mood normal.        Behavior: Behavior normal.        Thought Content: Thought content normal.        Judgment: Judgment normal.      ED Treatments / Results  Labs  (all labs ordered are listed, but only abnormal results are displayed) Labs Reviewed  COMPREHENSIVE METABOLIC PANEL - Abnormal; Notable for the following components:      Result Value   Glucose, Bld 241 (*)    BUN 30 (*)    Creatinine, Ser 3.77 (*)    Calcium 8.5 (*)    Albumin 2.2 (*)    AST 11 (*)    Total Bilirubin 0.1 (*)    GFR calc non Af  Amer 18 (*)    GFR calc Af Amer 21 (*)    All other components within normal limits  CBC WITH DIFFERENTIAL/PLATELET - Abnormal; Notable for the following components:   Hemoglobin 11.6 (*)    HCT 37.6 (*)    Platelets 641 (*)    Eosinophils Absolute 0.6 (*)    All other components within normal limits  I-STAT CHEM 8, ED - Abnormal; Notable for the following components:   BUN 29 (*)    Creatinine, Ser 3.80 (*)    Glucose, Bld 120 (*)    Hemoglobin 11.9 (*)    HCT 35.0 (*)    All other components within normal limits  SARS CORONAVIRUS 2 (TAT 6-24 HRS)  PROTIME-INR  ETHANOL  APTT  RAPID URINE DRUG SCREEN, HOSP PERFORMED  URINALYSIS, ROUTINE W REFLEX MICROSCOPIC    EKG EKG Interpretation  Date/Time:  Tuesday September 30 2019 10:13:59 EST Ventricular Rate:  88 PR Interval:    QRS Duration: 83 QT Interval:  385 QTC Calculation: 466 R Axis:   58 Text Interpretation: Sinus rhythm Probable LVH with secondary repol abnrm Baseline wander in lead(s) V1 V6 since last tracing no significant change Confirmed by Daleen Bo [phone]) on 09/30/2019 10:28:05 AM   Radiology No results found.  Procedures .Critical Care Performed by: Daleen Bo, MD Authorized by: Daleen Bo, MD   Critical care provider statement:    Critical care time (minutes):  35   Critical care start time:  09/30/2019 8:08 AM   Critical care end time:  09/30/2019 10:23 AM   Critical care time was exclusive of:  Separately billable procedures and treating other patients   Critical care was time spent personally by me on the following activities:  Blood draw for  specimens, development of treatment plan with patient or surrogate, discussions with consultants, evaluation of patient's response to treatment, examination of patient, obtaining history from patient or surrogate, ordering and performing treatments and interventions, ordering and review of laboratory studies, pulse oximetry, re-evaluation of patient's condition, review of old charts and ordering and review of radiographic studies   (including critical care time)  Medications Ordered in ED Medications  amLODipine (NORVASC) tablet 10 mg (10 mg Oral Given 09/30/19 0859)  tetracaine (PONTOCAINE) 0.5 % ophthalmic solution 2 drop (2 drops Right Eye Given 09/30/19 0859)     Initial Impression / Assessment and Plan / ED Course  I have reviewed the triage vital signs and the nursing notes.  Pertinent labs & imaging results that were available during my care of the patient were reviewed by me and considered in my medical decision making (see chart for details).  Clinical Course as of Sep 29 1028  Tue Sep 30, 2019  0820 Normal except glucose high, BUN high, creatinine high, calcium low, albumin low, AST low, total bilirubin low, GFR low  Comprehensive metabolic panel(!) [EW]  3785 Normal  Protime-INR [EW]  0820 Normal except hemoglobin low  CBC with Differential(!) [EW]  0940 Received call back from ophthalmology, Dr. Valetta Close.  He says he sent patient here for suspected retinal artery occlusion, a stroke equivalent.  He states that the patient has severe proliferative diabetic retinal artery changes.  Also, his right pupil is distorted, and nonreactive, from neovascularization of the right iris.   [EW]  [phone] Comprehensive stroke evaluation in the emergency department, initiated at this time.  He is not a TPA candidate, this is not a code stroke.   [EW]    Clinical Course User  Index [EW] Daleen Bo, MD        Patient Vitals for the past 24 hrs:  BP Temp Temp src Pulse Resp SpO2  09/30/19  0915 (!) 192/95 - - 96 - 100 %  09/30/19 0845 (!) 189/80 - - 83 - 100 %  09/30/19 0815 (!) 199/80 - - 80 - 99 %  09/30/19 0718 (!) 166/82 98.5 F (36.9 C) Oral 95 17 100 %  09/30/19 0333 (!) 155/85 98.4 F (36.9 C) Oral 94 16 100 %  09/29/19 2104 (!) 166/78 - - 85 14 100 %  09/29/19 1737 (!) 173/80 97.8 F (36.6 C) Oral 99 16 100 %    9:49 AM Reevaluation with update and discussion. After initial assessment and treatment, an updated evaluation reveals no change in clinical status.  Visual acuity performed, no vision right eye.  Somewhat diminished vision left eye. Daleen Bo   Medical Decision Making: Subacute vision loss right eye, stroke syndrome, requiring comprehensive evaluation.  Patient currently being treated with Plavix and aspirin, as well as antihypertensive medication.  Recently hospitalized for stroke evaluation, 7 weeks ago.  He has a known aortic plaque which is ulcerated.  This is a potential source for emboli.  He does not appear to have other acute or subacute neurologic abnormalities at this time.  He requires hospitalization for further evaluation and treatment.  RAIFORD FETTERMAN was evaluated in Emergency Department on 09/30/2019 for the symptoms described in the history of present illness. He was evaluated in the context of the global COVID-19 pandemic, which necessitated consideration that the patient might be at risk for infection with the SARS-CoV-2 virus that causes COVID-19. Institutional protocols and algorithms that pertain to the evaluation of patients at risk for COVID-19 are in a state of rapid change based on information released by regulatory bodies including the CDC and federal and state organizations. These policies and algorithms were followed during the patient's care in the ED.  CRITICAL CARE-yes Performed by: Daleen Bo   Nursing Notes Reviewed/ Care Coordinated Applicable Imaging Reviewed Interpretation of Laboratory Data incorporated into ED  treatment  10:05 AM-Consult complete with admitting resident. Patient case explained and discussed.  He agrees to admit patient for further evaluation and treatment. Call ended at 10:12 AM  Plan: Admit    Final Clinical Impressions(s) / ED Diagnoses   Final diagnoses:  Retinal artery occlusion, central, right  Cerebrovascular accident (CVA) due to embolism of precerebral artery ([place])  Hypertension, unspecified type    ED Discharge Orders    None       Daleen Bo, MD 09/30/19 1024    Daleen Bo, MD 09/30/19 1029

## 2019-09-30 NOTE — ED Notes (Signed)
Pt given nourishment bag

## 2019-09-30 NOTE — ED Notes (Signed)
Patient transported to MRI 

## 2019-09-30 NOTE — ED Notes (Signed)
Family at bedside. 

## 2019-09-30 NOTE — ED Notes (Signed)
Pt returns from mri.

## 2019-09-30 NOTE — ED Notes (Signed)
Pt has returned from MRI. 

## 2019-09-30 NOTE — ED Notes (Signed)
Patients son [name] at the bedside. He has been provided with an update.

## 2019-09-30 NOTE — ED Notes (Signed)
Visually checked on pt. He requested lights remain on. Pt. Is comfortable but states his head hurts slightly.

## 2019-09-30 NOTE — Consult Note (Addendum)
Neurology Consultation  Reason for Consult: Right CRAO  Referring Physician: Dr. Eulis Foster  History is obtained from: Patient/chart/interpreter  HPI: Phillip Camacho is a 48 y.o. male with a history of DM who was hospitalized 7 weeks ago for hypertensive urgency and acute CVA - at that time an ulcerated aortic arch plaque was diagnosed and the patient was started on ASA and Plavix. He also was seen by Nephrology 2 weeks ago for acute on chronic renal insufficiency with nephrotic syndrome.  Patient was recently admitted on 08/16/2019 for stroke symptoms.  Symptoms at that time were confusion, slurred speech, left facial droop and left-sided weakness.  MRI at that time showed right cerebellum, bilateral punctate MCA/PCA and right thalamic infarcts.  TEE showed no intracardiac thrombus, mass or shunt, MRA of head showed age advanced atherosclerosis, right ICA siphon 50% stenosis, right P2 moderate and P3 high-grade stenosis.  2D echo showed EF of 65 to 70%.  LDL was 248 and A1c was 11.6.  Patient was placed on dual antiplatelet therapy for 3 months with plan subsequently for aspirin alone.  Given that the stroke occurred 6 weeks ago, he is still on DAPT.    The patient had onset of complete vision loss in his right eye 3 days ago, which stared gradually with blurring, but then rapidly progressed to complete vision loss. He states that he cannot see anything, including differences in light level - vision is totally black in his right eye. He went to see Dr. Franne Grip of [facility] yesterday. A right CRAO was noted and the patient was advised to go to the ER for further management. He did not make it here until today.   When patient was asked why he did not immediately come to the emergency department, he stated he did not have anybody to drive him.  Currently he states that his right eye is painful and also has a headache on that side.  He states he has no vision out of that right eye.  He states that he  has been taking his aspirin and Plavix as directed. He also takes Lipitor. He denies any weakness or sensory numbness.   MRI brain obtained in the ED reveals new small acute to early subacute infarcts in the right frontal, right occipital, and left temporal lobes. Chronic small vessel ischemia with multiple old infarcts also noted.   Chart review-as above  LKW: 3 days prior tpa given?: no, out of window Premorbid modified Rankin scale (mRS): 0 NIH stroke scale of 0   Past Medical History:  Diagnosis Date  . Diabetes mellitus without complication ([place])   . Stroke [facility])     Family History  Problem Relation Age of Onset  . Other Neg Hx        patient denies any particular family medical history    Social History:   reports that he has been smoking. He has never used smokeless tobacco. He reports that he does not drink alcohol. No history on file for drug.  Medications No current facility-administered medications for this encounter.   Current Outpatient Medications:  .  amLODipine (NORVASC) 10 MG tablet, Take 1 tablet (10 mg total) by mouth daily., Disp: 60 tablet, Rfl: 0 .  aspirin 325 MG tablet, Take 1 tablet (325 mg total) by mouth daily. (Patient taking differently: Take 325 mg by mouth daily. Patient taking 4 tablets of 56m once daily), Disp: 90 tablet, Rfl: 0 .  atorvastatin (LIPITOR) 80 MG tablet, Take 1 tablet (  80 mg total) by mouth daily at 6 PM., Disp: 30 tablet, Rfl: 0 .  blood glucose meter kit and supplies KIT, Dispense based on patient and insurance preference. Use up to four times daily as directed. (FOR ICD-9 250.00, 250.01)., Disp: 1 each, Rfl: 0 .  clopidogrel (PLAVIX) 75 MG tablet, Take 75 mg by mouth daily., Disp: , Rfl:  .  insulin aspart (NOVOLOG) 100 UNIT/ML FlexPen, Inject 4 Units into the skin 3 (three) times daily with meals., Disp: 15 mL, Rfl: 0 .  Insulin Glargine (LANTUS) 100 UNIT/ML Solostar Pen, Inject 12 Units into the skin daily., Disp: 15 mL, Rfl:  0 .  nicotine (NICODERM CQ - DOSED IN MG/24 HOURS) 21 mg/24hr patch, Place 1 patch (21 mg total) onto the skin daily., Disp: 28 patch, Rfl: 0   Exam: Current vital signs: BP (!) 192/95   Pulse 96   Temp 98.5 F (36.9 C) (Oral)   Resp 17   SpO2 100%  Vital signs in last 24 hours: Temp:  [97.8 F (36.6 C)-98.5 F (36.9 C)] 98.5 F (36.9 C) (12/08 0718) Pulse Rate:  [80-99] 96 (12/08 0915) Resp:  [14-17] 17 (12/08 0718) BP: (155-199)/(78-95) 192/95 (12/08 0915) SpO2:  [99 %-100 %] 100 % (12/08 0915)  ROS:    General ROS: negative for - chills, fatigue, fever, night sweats, weight gain or weight loss Psychological ROS: negative for - behavioral disorder, hallucinations, memory difficulties, mood swings or suicidal ideation Ophthalmic ROS: Positive for -  loss of vision and eye pain ENT ROS: positive for right eyelid inflammation, erythema of eyelid and sclera Respiratory ROS: negative for - cough, hemoptysis, shortness of breath or wheezing Cardiovascular ROS: negative for - chest pain, dyspnea on exertion, edema or irregular heartbeat Gastrointestinal ROS: negative for - abdominal pain, diarrhea, hematemesis, nausea/vomiting or stool incontinence Genito-Urinary ROS: negative for - dysuria, hematuria, incontinence or urinary frequency/urgency Musculoskeletal ROS: negative for - joint swelling or muscular weakness Neurological ROS: as noted in HPI Dermatological ROS: negative for rash and skin lesion changes   Physical Exam   Constitutional: Appears well-developed and well-nourished.  Psych: Affect appropriate to situation Eyes: Positive for right eye scleral injection, right eye lid inflammation HENT: No OP obstrucion. Right eyelid swollen with lacrimation noted.  Head: Normocephalic.  Cardiovascular: Normal rate and regular rhythm.  Respiratory: Effort normal, non-labored breathing GI: Soft.  No distension. There is no tenderness.  Skin: WDI  Neuro: Mental  Status: Patient is alert and oriented, able to follow all commands.  Shows no dysarthria or aphasia. Cranial Nerves: II: Complete Loss of vision in the right eye with normal vision of the left eye. Right fundus is pale with decreased vascularity. Right pupil is dilated at 6 mm and unreactive. Left pupil is 3 mm and reactive.  III,IV, VI: EOMI with ptosis right eye secondary to right eyelid inflammation .  V: Facial sensation is symmetric to temperature VII: Facial movement is symmetric.  VIII: hearing is intact to voice X: Palate elevates symmetrically XI: Shoulder shrug is symmetric. XII: tongue is midline without atrophy or fasciculations.  Motor: Tone is normal. Bulk is normal. 5/5 strength was present in all four extremities.  Sensory: Sensation is symmetric to light touch and temperature in the arms and legs. Deep Tendon Reflexes: 2+ and symmetric in the biceps no lower extremity DTRs.  Plantars: Toes are downgoing bilaterally.  Cerebellar: FNF and HKS are intact bilaterally  Labs I have reviewed labs in epic and the results pertinent  to this consultation are:   CBC    Component Value Date/Time   WBC 9.9 09/29/2019 1753   RBC 4.30 09/29/2019 1753   HGB 11.9 (L) 09/30/2019 1013   HCT 35.0 (L) 09/30/2019 1013   PLT 641 (H) 09/29/2019 1753   MCV 87.4 09/29/2019 1753   MCH 27.0 09/29/2019 1753   MCHC 30.9 09/29/2019 1753   RDW 13.8 09/29/2019 1753   LYMPHSABS 1.9 09/29/2019 1753   MONOABS 0.5 09/29/2019 1753   EOSABS 0.6 (H) 09/29/2019 1753   BASOSABS 0.1 09/29/2019 1753    CMP     Component Value Date/Time   NA 140 09/30/2019 1013   K 4.3 09/30/2019 1013   CL 110 09/30/2019 1013   CO2 24 09/29/2019 1753   GLUCOSE 120 (H) 09/30/2019 1013   BUN 29 (H) 09/30/2019 1013   CREATININE 3.80 (H) 09/30/2019 1013   CALCIUM 8.5 (L) 09/29/2019 1753   PROT 6.8 09/29/2019 1753   ALBUMIN 2.2 (L) 09/29/2019 1753   AST 11 (L) 09/29/2019 1753   ALT 13 09/29/2019 1753    ALKPHOS 105 09/29/2019 1753   BILITOT 0.1 (L) 09/29/2019 1753   GFRNONAA 18 (L) 09/29/2019 1753   GFRAA 21 (L) 09/29/2019 1753    Lipid Panel     Component Value Date/Time   CHOL 312 (H) 08/13/2019 0356   TRIG 125 08/13/2019 0356   HDL 39 (L) 08/13/2019 0356   CHOLHDL 8.0 08/13/2019 0356   VLDL 25 08/13/2019 0356   LDLCALC 248 (H) 08/13/2019 0356   Patient did have a hypercoagulable panel on 10/02/2019 which only showed slightly elevated protein S antigen of 162  Imaging I have reviewed the images obtained:  MRI examination of the brain-new small acute to early subacute infarcts in the right frontal, right occipital and left temporal lobes.  Chronic small vessel ischemia with multiple old infarcts   Etta Quill PA-C [facility] [phone] 09/30/2019, 10:31 AM    Assessment: 48 year old male with history of recent strokes, ulcerated aortic arch plaque, recently started on ASA and Plavix, who re-presents with right CRAO.  1. MRI brain in the ED today reveals new small acute to early subacute infarcts in the right frontal, right occipital, and left temporal lobes. Chronic small vessel ischemia with multiple old infarcts also noted.   2. Recent stroke work up from hospitalization 7 weeks ago: MRI showed right cerebellum, bilateral punctate MCA/PCA and right thalamic infarcts.  MRA of head showed age advanced atherosclerosis, right ICA siphon 50% stenosis, right P2 moderate and P3 high-grade stenosis.  MRA neck was essentially negative. 2D echo showed EF of 65 to 70%.  LDL was 248 and A1c was 11.6. TEE showed no intracardiac thrombus, mass or shunt; however, moderate plaque invoving the transverse aorta was noted.  3. On exam his right pupil is nonreactive and 5 mm.  Right eyelid does look inflamed and patient complains of right ocular pain; however, patient has been rubbing his right eye and MRI brain shows no intraorbital abnormality. . 4. Stroke risk factors: Prior stroke,  HLD, DM and HTN 5. Given recurrence of embolic phenomena in multiple vascular territories, current and prior strokes as well as the right CRAO are most likely cardioembolic from an occult thrombus, undiagnosed paroxysmal atrial fibrillation, or due to embolization from the ulcerated aortic arch plaque diagnosed previously. May need loop recorder, possible more detailed imaging of the aortic arch and carotid ultrasound.     Recommendations: - Patient has recently had a full  stroke work-up.  At this point no need repeat full stroke work-up however patient should have a carotid duplex bilaterally to reassess despite essentially unremarkable MRA neck with prior evaluation.   -- Consider placement of a loop recorder.  -- May need escalation of treatment from DAPT to oral anticoagulation. -- Bilateral carotid ultrasound -- Cardiology should be contacted to determine if more detailed imaging of his aortic arch is indicated, perhaps with cardiac CT.  - Stroke team to see tomorrow and make final recommendations regarding anticoagulation. Continue DAPT for now.  -- PT/OT.   I have seen and examined the patient. I have formulated the assessment and recommendations. 48 year old male with recent strokes, re-presents with right CRAO and new subacute strokes in multiple vascular territories. Exam reveals right monocular blindness and pale retina with decreased vascular markings OD. Most likely cardioembolic mechanism or embolization from unstable aortic arch plaque - an associated aortic arch mural thrombus should also be considered. Recommendations as above.  Electronically signed: Dr. Kerney Elbe

## 2019-09-30 NOTE — ED Notes (Signed)
Pt provided with Diet Sprite and Crackers after he has woken up from his nap. Standby assist to the bathroom to void.

## 2019-09-30 NOTE — ED Notes (Signed)
Returned from MRI 

## 2019-09-30 NOTE — H&P (Addendum)
Date: 09/30/2019               Patient Name:  Phillip Camacho MRN: [id number]  DOB: 1971-10-11 Age / Sex: 48 y.o., male   PCP: Leonard Downing, MD              Medical Service: Internal Medicine Teaching Service              Attending Physician: Dr. Lalla Brothers    First Contact: [name], MS 3 Pager: [phone]  Second Contact: Dr. Harvie Heck Pager: [phone]  Third Contact Dr. Neva Seat Pager: [phone]       After Hours (After 5p/  First Contact Pager: [phone]  weekends / holidays): Second Contact Pager: [phone]   Chief Complaint: Vision loss  History of Present Illness: Mr. Reichling is a 48 year old male with a PMHx of CVA, CKD 3b, HTN and diabetes who presents with a 3 day history of vision loss and associated eye pain.  The pt speaks [place] but does understand some [place]. An interpreter was used for the encounter.  The pt reports sudden vision loss 3 days ago and then subsequent pain in his eye after that. He endorses chills but denies any dizziness, numbness in extremities, chest pain, fevers, chills, or night sweats. The pt reports going to see an ophthalmologist yesterday, and was given pictures of his retina, which he brought with him today. The pt reports that he did not come to the ER earlier because he had no one to transport him here. The pt does endorse having a stroke 1 month ago, where he endorses dysarthria and numbness in his arm, but does not endorse does symptoms over the past 3 days.   The pt reports that he measures his blood pressure at home and that his numbers "run a little high, in the 150's." the pt reports he checks his glucose at home and that those numbers are good. The pt reports that he takes all of his medication as prescribed to him.   Meds:  Amlodipine 10 mg QD Aspirin 325 mg QD Atorvastatin 80 mg QD Clopidogrel 75 mg QD Aspart insulin 4 Units, three times a day Glargine insulin 12 units, once daily Losartan 50 mg QD   Allergies: Allergies as of 09/29/2019   (No Known Allergies)   Past Medical History:  Diagnosis Date   Diabetes mellitus without complication ([place])    Stroke [facility])     Family History: no relevant family history  Social History: Pt is from [place]. Currently lives by himself. Pt reports smoking cigarettes daily.  Review of Systems: Comprehensive ROS completed and negative except per HPI  Physical Exam: Blood pressure (!) 192/95, pulse 96, temperature 98.5 F (36.9 C), temperature source Oral, resp. rate 17, SpO2 100 %. Physical Exam Constitutional:      Comments: Pt appears tired and uncomfortable, not toxic appearing  HENT:     Head: Normocephalic.  Eyes:     General: Visual field deficit present. No scleral icterus.       Left eye: Discharge present.    Comments: Erythematous conjunctiva of right eye, Right eyelid edema. Impaired right eye abduction. Left eye EOM intact  Cardiovascular:     Rate and Rhythm: Normal rate and regular rhythm.     Heart sounds: Normal heart sounds. No murmur. No friction rub. No gallop.   Pulmonary:     Effort: Pulmonary effort is normal. No respiratory distress.     Breath sounds: Normal breath  sounds. No wheezing.  Neurological:     Mental Status: He is alert.     Sensory: Sensation is intact.     Motor: Motor function is intact.     Deep Tendon Reflexes:     Reflex Scores:      Brachioradialis reflexes are 2+ on the right side and 2+ on the left side.      Achilles reflexes are 2+ on the right side and 2+ on the left side.    Comments: Pt unable to abduct R eye, closes eyes and vision returns forward.   Psychiatric:        Attention and Perception: Attention normal.        Behavior: Behavior is agitated.     Images from ophthalmology visit, yesterday   Right eye   Left eye  MR Brain W/O Contrast: New small acute to early subacute infarcts in the right frontal, right occipital, and left temporal lobes. Chronic small vessel  ischemia with multiple old infarcts as Above.   Assessment & Plan by Problem: Principal Problem:   Central retinal artery occlusion Active Problems:   Acute CVA (cerebrovascular accident) ([place])   DM (diabetes mellitus) ([place])   Essential hypertension  Mr. Reinsmith is a 48 year old male with a PMHx of HTN, diabetes,and CVA 6 weeks ago who presents with a 3 day history of vision loss, eye pain and periorbital edema. Occlusion from atherosclerotic plaques are most likely etiology, considering pt's hx of diabetes, hypertension, and previous imaging showing presence of plaques and carotid artery stenosis.  Central retinal artery occlusion/periorbital edema Pt's complete unilateral vision loss as well as retinal imaging suggests retina artery occlusion. Hx of multiple CVA's suggest most likely thromboembolic etiology. Pt also has diabetes and hypertension that is poorly-controlled which could contribute to the vision loss. GCA is less likely due to age and pt's recent medical history - Will order MR angio of the orbit, head to check for possible emboli - Will order GCA labs for GCA rule out. - Will order basic lab workup to check for infections - Consulted w/ Ophthalmology, greatly appreciate recs. - Will acquire collateral with cardiology about readings from pt's loop recorder to assess for arrhythmia  Acute CVA: Pt has multiple subacute infarcts bilaterally, suggesting multiple new CVA events. Pt's last CVA was 7 weeks ago. Most likely thromboembolic in nature from a larger blood vessel or source. - Will order MR angio of the head - Neurology on board, appreciate recs - q2hr neuro checks - Will order PT/OT consult - Order nurse stroke swallow screen - Place on permissive hypertension status for 24-48 hours  Diabetes mellitus: Last Hgb A1c(08/11/2019): 11.6. poorly-controlled, pt is on insulin regimen. Newly diagnosed with neovascular glaucoma. Most likely has retinopathy contributing to pt's  new-onset vision loss. - Order Hgb A1c - Continue current insulin regimen, adjust accordingly based on lab results  Essential hypertension: Blood pressures elevated. Pt on permissive hypertensive status due to stroke protocol - Will hold amlodipine 10 mg - Continue to monitor   Dispo: Admit patient to Inpatient with expected length of stay greater than 2 midnights.  Signed: Carin Primrose, Medical Student 09/30/2019, 4:47 PM

## 2019-10-01 ENCOUNTER — Other Ambulatory Visit: Payer: Self-pay | Admitting: *Deleted

## 2019-10-01 ENCOUNTER — Inpatient Hospital Stay (HOSPITAL_COMMUNITY): Payer: BLUE CROSS/BLUE SHIELD

## 2019-10-01 ENCOUNTER — Encounter: Payer: Self-pay | Admitting: *Deleted

## 2019-10-01 DIAGNOSIS — H3410 Central retinal artery occlusion, unspecified eye: Secondary | ICD-10-CM

## 2019-10-01 DIAGNOSIS — Z8673 Personal history of transient ischemic attack (TIA), and cerebral infarction without residual deficits: Secondary | ICD-10-CM

## 2019-10-01 DIAGNOSIS — H3411 Central retinal artery occlusion, right eye: Secondary | ICD-10-CM

## 2019-10-01 DIAGNOSIS — E118 Type 2 diabetes mellitus with unspecified complications: Secondary | ICD-10-CM

## 2019-10-01 DIAGNOSIS — I10 Essential (primary) hypertension: Secondary | ICD-10-CM

## 2019-10-01 LAB — LIPID PANEL
Cholesterol: 190 mg/dL (ref 0–200)
HDL: 30 mg/dL — ABNORMAL LOW (ref >40)
LDL Cholesterol: 133 mg/dL — ABNORMAL HIGH (ref 0–99)
Total CHOL/HDL Ratio: 6.3 RATIO
Triglycerides: 137 mg/dL (ref <150)
VLDL: 27 mg/dL (ref 0–40)

## 2019-10-01 LAB — COMPREHENSIVE METABOLIC PANEL
ALT: 11 U/L (ref 0–44)
AST: 10 U/L — ABNORMAL LOW (ref 15–41)
Albumin: 1.8 g/dL — ABNORMAL LOW (ref 3.5–5.0)
Alkaline Phosphatase: 97 U/L (ref 38–126)
Anion gap: 7 (ref 5–15)
BUN: 31 mg/dL — ABNORMAL HIGH (ref 6–20)
CO2: 23 mmol/L (ref 22–32)
Calcium: 8.3 mg/dL — ABNORMAL LOW (ref 8.9–10.3)
Chloride: 110 mmol/L (ref 98–111)
Creatinine, Ser: 3.52 mg/dL — ABNORMAL HIGH (ref 0.61–1.24)
GFR calc Af Amer: 22 mL/min — ABNORMAL LOW (ref >60)
GFR calc non Af Amer: 19 mL/min — ABNORMAL LOW (ref >60)
Glucose, Bld: 102 mg/dL — ABNORMAL HIGH (ref 70–99)
Potassium: 3.9 mmol/L (ref 3.5–5.1)
Sodium: 140 mmol/L (ref 135–145)
Total Bilirubin: 0.2 mg/dL — ABNORMAL LOW (ref 0.3–1.2)
Total Protein: 5.6 g/dL — ABNORMAL LOW (ref 6.5–8.1)

## 2019-10-01 LAB — GLUCOSE, CAPILLARY
Glucose-Capillary: 89 mg/dL (ref 70–99)
Glucose-Capillary: 54 mg/dL — ABNORMAL LOW (ref 70–99)
Glucose-Capillary: 100 mg/dL — ABNORMAL HIGH (ref 70–99)
Glucose-Capillary: 90 mg/dL (ref 70–99)
Glucose-Capillary: 123 mg/dL — ABNORMAL HIGH (ref 70–99)
Glucose-Capillary: 111 mg/dL — ABNORMAL HIGH (ref 70–99)

## 2019-10-01 LAB — SEDIMENTATION RATE: Sed Rate: 102 mm/hr — ABNORMAL HIGH (ref 0–16)

## 2019-10-01 LAB — HEMOGLOBIN A1C
Hgb A1c MFr Bld: 9.8 % — ABNORMAL HIGH (ref 4.8–5.6)
Mean Plasma Glucose: 234.56 mg/dL

## 2019-10-01 LAB — CBC
HCT: 33.3 % — ABNORMAL LOW (ref 39.0–52.0)
Hemoglobin: 10.5 g/dL — ABNORMAL LOW (ref 13.0–17.0)
MCH: 26.9 pg (ref 26.0–34.0)
MCHC: 31.5 g/dL (ref 30.0–36.0)
MCV: 85.2 fL (ref 80.0–100.0)
Platelets: 494 10*3/uL — ABNORMAL HIGH (ref 150–400)
RBC: 3.91 MIL/uL — ABNORMAL LOW (ref 4.22–5.81)
RDW: 13.6 % (ref 11.5–15.5)
WBC: 10.2 10*3/uL (ref 4.0–10.5)
nRBC: 0 % (ref 0.0–0.2)

## 2019-10-01 LAB — HIV ANTIBODY (ROUTINE TESTING W REFLEX): HIV Screen 4th Generation wRfx: NONREACTIVE

## 2019-10-01 LAB — C-REACTIVE PROTEIN: CRP: 0.9 mg/dL (ref <1.0)

## 2019-10-01 MED ORDER — DEXTROSE 50 % IV SOLN: 1.0000 | Freq: Once | INTRAVENOUS | Status: DC

## 2019-10-01 MED ORDER — CLOPIDOGREL BISULFATE 75 MG PO TABS
75.0000 mg | ORAL_TABLET | Freq: Every day | ORAL | Status: DC
  Administered 2019-10-01 – 2019-10-02 (×2): 75 mg via ORAL
  Filled 2019-10-01: qty 1

## 2019-10-01 MED ORDER — ASPIRIN EC 81 MG PO TBEC
81.0000 mg | DELAYED_RELEASE_TABLET | Freq: Every day | ORAL | Status: DC
  Administered 2019-10-02: 81 mg via ORAL
  Filled 2019-10-01: qty 1

## 2019-10-01 MED ORDER — ATORVASTATIN CALCIUM 80 MG PO TABS
80.0000 mg | ORAL_TABLET | Freq: Every day | ORAL | Status: DC
  Administered 2019-10-01 – 2019-10-02 (×2): 80 mg via ORAL
  Filled 2019-10-01 (×2): qty 1

## 2019-10-01 MED ORDER — SODIUM CHLORIDE 0.9 % IV SOLN
1000.0000 mg | INTRAVENOUS | Status: DC
  Administered 2019-10-01 – 2019-10-02 (×3): 1000 mg via INTRAVENOUS
  Filled 2019-10-01 (×2): qty 8

## 2019-10-01 MED ORDER — INSULIN ASPART 100 UNIT/ML ~~LOC~~ SOLN
0.0000 [IU] | Freq: Three times a day (TID) | SUBCUTANEOUS | Status: DC
  Administered 2019-10-01: 2 [IU] via SUBCUTANEOUS

## 2019-10-01 NOTE — Progress Notes (Addendum)
Subjective:  Last night, pt had a hypoglycemic episiode, which was corrected w/ PO carbs.    This morning the pt reported sleeping well and being able to eat. Pt was upset that he did not understand everything that was being checked on him and the reason he was in the hospital. Pt was told about evaluation for stroke and testing being done to confirm the cause of his vision loss. Pt is upset and reports he asked for pain medication this morning but was never given any.  Pt denies any family hx of lung disease, DM, HTN, vision loss. Unsure about thyroid disease. Pt reports that he currently lives with his son here in [place]. His son works to support both of them, pt is unable to work and is frustrated by that. The recent strokes and vision loss have both been setbacks that have made it difficult to work. He reports he does not have any other support in the community.   Objective:  Vital signs in last 24 hours: Vitals:   10/01/19 0045 10/01/19 0130 10/01/19 0242 10/01/19 0256  BP: (!) 163/66 (!) 168/70 (!) 166/78   Pulse: 81 85 81   Resp: 13 10 19    Temp:   98.9 F (37.2 C)   TempSrc:   Oral   SpO2: 98% 100% 100%   Weight:    67.1 kg   Weight change:   Intake/Output Summary (Last 24 hours) at 10/01/2019 0731 Last data filed at 10/01/2019 0300 Gross per 24 hour  Intake 240 ml  Output   Net 240 ml   Physical Exam Constitutional:      Comments: Pt is lying comfortably on hospital bed  HENT:     Mouth/Throat:     Mouth: Mucous membranes are moist.  Eyes:     Comments: Lacrimation of the right eye  Cardiovascular:     Rate and Rhythm: Normal rate and regular rhythm.  Neurological:     Mental Status: He is alert.    Labs: BUN: 31, Cr 3.52, Alb 1.8, GFR 19 Hgb 10.5, Hct 33.3 HgbA1c: 9.8 LDL 254->133 ESR 102  U/A: + glucose  EKG: normal sinus rhythm - tele monitoring showed 3 strips: normal sinus rhythm, then undetectable p waves, then R-R' waves  Assessment/Plan:   Principal Problem:   Central retinal artery occlusion Active Problems:   Acute CVA (cerebrovascular accident) ([place])   DM (diabetes mellitus) ([place])   Essential hypertension  Mr. Maniscalco is a 48 year old male with a PMHx of HTN, diabetes,and CVA 7 weeks ago who presents with a 3 day history of vision loss, eye pain and periorbital edema. Occlusion from atherosclerotic plaques are most likely etiology, considering pt's hx of diabetes, hypertension, and previous imaging showing presence of plaques and carotid artery stenosis. Embolic etiology 2/2 to arrhythmia could also contribute to the bilateral strokes.   Central retinal artery occlusion/periorbital edema Pt's complete unilateral vision loss as well as retinal imaging suggests retina artery occlusion. Hx of multiple CVA's suggest most likely thromboembolic etiology. Pt also has diabetes and hypertension that is poorly-controlled which could contribute to the vision loss. Ophthalmology also newly diagnosed him with neovascular glaucoma at his appt 3 days ago. Previous and current CVA's cause concern for a source of thromboemboli, potentially undeteced A fib. EKG was benign.  Ophtho would like to rule out GCA, ESR is elevated. Will order temporal artery biopsy if no findings of atrial fibrillation are found on loop recorder - Consulted w/ Ophthalmology, greatly appreciate  recs. - Will order empiric Solumedrol  - Will consult cardiology about pt's loop recorder data to assess for arrhythmia - Will order Tylenol 650 mg PRN and Dilaudid 0.5 mg for eye pain  Acute CVA: Pt has multiple subacute infarcts bilaterally, suggesting multiple new CVA events. Pt's last CVA was 7 weeks ago. Most likely thromboembolic in nature from a larger blood vessel or source. Pt has many risk factors that make his stroke etiology likely thrombotic, also concern for potential arrythmia leading to embolus formation.  CHADs-VASC score is 5 and HAS-BLED score is 2 so would  indicates anticoagulation should begin. - Neurology consulted - Will order PT/OT consult - Place on permissive hypertension status for 24-48 hours   Diabetes mellitus: Last Hgb A1c(08/11/2019): 11.6. poorly-controlled, pt is on insulin regimen. Newly diagnosed with neovascular glaucoma. Most likely has retinopathy contributing to pt's new-onset vision loss. - A1c: 11.6(07/2019)->9.8, improved but remains poorly controlled - continue to monitor blood glucose - Continue current insulin regimen, adjust accordingly based on glucose checks   Essential hypertension: Blood pressures elevated. Pt on permissive hypertensive status due to stroke protocol - Will hold amlodipine 10 mg - Continue to monitor   LOS: 1 day   Carin Primrose, Medical Student 10/01/2019, 7:31 AM

## 2019-10-01 NOTE — Progress Notes (Signed)
Hypoglycemic Event CBG < 70, Pt asymptomatic  CBG: 58  Treatment: gave oral carbonhydrate  Symptoms:  None  Follow-up CBG: Time: 0644 CBG Result 100  Possible Reasons for Event:   Pt has been NPO since arrival t the hospital   Comments/MD notified:Chundi.    Wells Guiles Essien-[place]pan

## 2019-10-01 NOTE — Progress Notes (Addendum)
Occupational Therapy Evaluation Patient Details Name: Phillip Camacho MRN: [id number] DOB: 1971-09-12 Today's Date: 10/01/2019    History of Present Illness Pt is a 48 y.o. M with significant PMH of CVA, CKD, HTN, and diabetes who presents with a 3 day history of vision loss and associated eye pain. MRI showing new small acute to early subacute infarcts in the right frontal, right occipital, and left temporal lobes.   Clinical Impression   Teleinterpreter used throughout session: Phillip Camacho. PTA, pt was living at home alone, and reports he was independent with ADL/IADL and functional mobility. 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 Pt currently completes ADL with modified independence to independence level. He demonstrates compensatory strategies for decreased vision. Pt reports he is unable to see anything out of his right eye. He requires minguard for functional mobility with 2x minor LOB, pt able to self-correct using external surface. Pt will benefit from continued skilled OT services to address low vision, medication management, stability and safety during functional mobility, and understanding signs and symptoms of a stroke. Currently recommend Outpatient OT services. Will continue to follow acutely.     Follow Up Recommendations  Outpatient OT    Equipment Recommendations  None recommended by OT    Recommendations for Other Services  SLP (pt reports difference in speech)     Precautions / Restrictions Precautions Precautions: Fall;Other (comment) Precaution Comments: Right visual field cut Restrictions Weight Bearing Restrictions: No      Mobility Bed Mobility Overal bed mobility: Independent                Transfers Overall transfer level: Needs assistance Equipment used: None Transfers: Sit to/from Stand Sit to Stand: Supervision         General transfer comment: (S) overall for safety. Pt aware of deficits    Balance Overall balance assessment:  Mild deficits observed, not formally tested             Standing balance comment: Mild postural sway/perturbations, 2x minor LOB noted pt able to self correct with external surface                           ADL either performed or assessed with clinical judgement   ADL Overall ADL's : At baseline                                       General ADL Comments: pt able to complete oral care sink level , bathroom transfer, LB dressing. Pt reports he feels he is at his baseline;able to compensate for visual deficits     Vision Baseline Vision/History: No visual deficits Patient Visual Report: Other (comment)(decreased vision in R eye) Vision Assessment?: Yes Eye Alignment: Within Functional Limits Ocular Range of Motion: Within Functional Limits Alignment/Gaze Preference: Within Defined Limits Tracking/Visual Pursuits: Able to track stimulus in all quads without difficulty Saccades: Other (comment)(difficult to assess due to language barrier) Convergence: Impaired (comment)(r eye does not converge) Additional Comments: all aspects WFL/WDL/WNL for L eye;pt reports he is unable to see at all out of R eye, reports it is all black;able to compensate during ADL and functional mobiltiy     Perception     Praxis      Pertinent Vitals/Pain Pain Assessment: Faces Pain Score: 8  Faces Pain Scale: Hurts even more Pain Location: right eye  Pain Descriptors / Indicators: Grimacing;Guarding Pain Intervention(s): Limited activity within patient's tolerance;Monitored during session     Hand Dominance Right   Extremity/Trunk Assessment Upper Extremity Assessment Upper Extremity Assessment: Overall WFL for tasks assessed   Lower Extremity Assessment Lower Extremity Assessment: Defer to PT evaluation   Cervical / Trunk Assessment Cervical / Trunk Assessment: Normal   Communication Communication Communication: Prefers language other than English([place]namese,  limited english)   Cognition Arousal/Alertness: Awake/alert Behavior During Therapy: WFL for tasks assessed/performed Overall Cognitive Status: Difficult to assess                                 General Comments: All cognition WFL during session. Appropriate command following, spatial orientation   General Comments  Dr. Leonie Man present during session to discuss stroke and implications with pt;pt reported he has no additional questions    Exercises     Shoulder Instructions      Home Living Family/patient expects to be discharged to:: Private residence Living Arrangements: Alone Available Help at Discharge: Family;Available PRN/intermittently Type of Home: Apartment Home Access: Stairs to enter Entrance Stairs-Number of Steps: flight Entrance Stairs-Rails: Right Home Layout: Two level Alternate Level Stairs-Number of Steps: flight Alternate Level Stairs-Rails: Right Bathroom Shower/Tub: Tub/shower unit;Walk-in shower   Bathroom Toilet: Standard     Home Equipment: None   Additional Comments: son reports his lease ends this month, looking for another home possibly ALF . son reports having outside cameras and being able to see patient come and go . 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 Additional ADL Goal #2: Pt will demonstrate independence with 3 low vision strategies during ADL/IADL and functional mobility. Additional ADL Goal #3: Pt will demonstrate independence with verbalizing signs and symptoms of stroke using BE FAST acroynm  OT Frequency: Min 2X/week   Barriers to D/C:            Co-evaluation              AM-PAC OT "6 Clicks" Daily Activity     Outcome Measure Help from another person eating meals?: None Help from another person taking care of personal grooming?: None Help from another person toileting, which includes using toliet, bedpan, or urinal?: None Help from another person bathing (including washing, rinsing, drying)?: None Help from another person to put on and taking off regular upper body clothing?: None Help from another person to put on and taking off regular lower body clothing?: None 6 Click Score: 24   End of Session Equipment Utilized During Treatment: Gait belt Nurse Communication: Mobility status  Activity Tolerance: Patient tolerated treatment well Patient left: with call bell/phone within reach;in chair;with chair alarm set  OT Visit Diagnosis:  Muscle weakness (generalized) (M62.81);Low vision, both eyes (H54.2);Pain Pain - Right/Left: Right Pain - part of body: (eye)                Time: 1103-1130 OT Time Calculation (min): 27 min Charges:  OT General Charges $OT Visit: 1 Visit OT Evaluation $OT Eval Moderate Complexity: 1 Mod OT Treatments $Self Care/Home Management : 8-22 mins  Dorinda Hill OTR/L Acute Rehabilitation Services Office: [phone]   Wyn Forster 10/01/2019, 12:44 PM

## 2019-10-01 NOTE — Patient Outreach (Addendum)
[facility]) Care Management  10/01/2019  Phillip Camacho [birth year] [id number]   [facility] care coordination re admission note, case closure   [facility] RN CM noted pt re admitted to hospital  [name] was referred to [facility] after his 10/19/20n admission for stroke and discharged with EMMI stroke referral, not on APL   [name] was seen by [place] and closed, Home visit completed by [facility] pharmacy, RN CM and [place] interpreter [name] had [facility] SW assisting with social concerns (refer to [facility] SW notes) [name] had [facility] RN CM following with pending transfer to [facility] and wellness center [facility]) services as he was assisted with getting an appointment to be seen by Dr Chapman Fitch on October 03 2019 at 1450.   He is for follow up pending hospital discharge and pending establishing of care with [facility]   [facility] RN CM updated [facility] hospital liaisons of [name] re admission [facility] RN CM collaborated with [facility] hospital liaisons and [facility] clinical nurse specialist (case closure) [facility] RN CM left a voice message for [facility] SW, [place] RN CM and sent an e-mail to [facility] RN CM  [facility] RN CM spoke with [facility] SW to complete a warm transfer of patient to [facility] Main services with detail information on [facility] interventions completed with [name] and encouraged collaboration calls prn   [facility] RN CM completed warm transfer to [facility] RN and SW  This case will be closed as this patient will have [facility] as external CM/SW program [facility] RN CM discussed this with his daughter, [name] during the 09/10/19 contact  [place] called to remind daughter but had to leave a voice message along with contact numbers for [facility] RN CM, [place], [place], [place] and [facility] RN CM number if further questions   [employer]. Lavina Hamman, RN, BSN, [place] Coordinator Office number [phone] Mobile number [phone]  Main [facility] number [phone] Fax number [phone]

## 2019-10-01 NOTE — Progress Notes (Addendum)
Inpatient Diabetes Program Recommendations  AACE/ADA: New Consensus Statement on Inpatient Glycemic Control (2015)  Target Ranges:  Prepandial:   less than 140 mg/dL      Peak postprandial:   less than 180 mg/dL (1-2 hours)      Critically ill patients:  140 - 180 mg/dL   Lab Results  Component Value Date   GLUCAP 90 10/01/2019   HGBA1C 9.8 (H) 10/01/2019    Review of Glycemic Control Results for Phillip Camacho, Phillip Camacho (MRN [id number]) as of 10/01/2019 12:18  Ref. Range 09/30/2019 23:15 10/01/2019 03:08 10/01/2019 06:12 10/01/2019 06:42 10/01/2019 11:48  Glucose-Capillary Latest Ref Range: 70 - 99 mg/dL 151 (H) 89 54 (L) 100 (H) 90   Diabetes history: DM 2 Outpatient Diabetes medications: Lantus 8 units, Novolog 4 units tid  Current orders for Inpatient glycemic control: Lantus 8 units, Novolog 0-9 units tid + hs  A1c 9.8% on 12/9 lower than before (11.6% on 10/19)  Inpatient Diabetes Program Recommendations:    Hypoglycemia in the 50's this am. Consider reducing Lantus to 5 units.  Addendum 2pm:  Spoke with patient through interpreter Mitzi Hansen ID# [phone]). Patient reports checking his glucose once a day. Trends usually 133-135 at home. Patient reports taking his medication consistently. Pt does not have a PCP and got his prescription of insulin from his last hospitalization.  Patient reports needing test strips for his glucose meter (generic strip order # [id number]).  Discussed current A1c 9.8%. Discussed glucose and A1c goals. Discuss the importance for glucose control to reduce complications associated with uncontrolled DM. Discussed glucose control relating to admitting diagnosis.  Advised pt to check glucose bid at home to see what his glucose trends are other parts of the day.  Patient reports no other needs at this time.  Thanks,  Tama Headings RN, MSN, BC-ADM Inpatient Diabetes Coordinator Team Pager [phone] (8a-5p)

## 2019-10-01 NOTE — Patient Outreach (Signed)
[facility]) Care Management  10/01/2019  Phillip Camacho 1971-05-03 [id number]   Care coordination- return call from Daughter  Call from [name] daughter, Phillip Camacho after she received [facility] RN CM voice messages She reports she was not able to hear the message very well and she called on the [facility] RN CM office line with some further audio issues HIPAA verified  Phillip Camacho provided an update on the voice message left confirming the warm transfer of [name] to [facility] health and wellness center [facility]) services with the contact numbers for the [facility] SW and RN CM  Phillip Camacho inquired if [name] had another stroke as she states she was informed by her brother, Phillip Camacho and asked "what's going on?" [facility] RN CM discussed being aware of [name] admission and [facility] RN CM recommended she call the  nursing unit, her father or Phillip Camacho to have the nurse to give her clarity. She voiced understanding  Phillip Camacho L. Lavina Hamman, RN, BSN, [place] Coordinator Office number [phone] Mobile number [phone]  Main [facility] number [phone] Fax number [phone]

## 2019-10-01 NOTE — Evaluation (Addendum)
Physical Therapy Evaluation Patient Details Name: Phillip Camacho MRN: [id number] DOB: 1971/07/09 Today's Date: 10/01/2019   History of Present Illness  Pt is a 48 y.o. M with significant PMH of CVA, CKD, HTN, and diabetes who presents with a 3 day history of vision loss and associated eye pain. MRI showing new small acute to early subacute infarcts in the right frontal, right occipital, and left temporal lobes.  Clinical Impression  Pt admitted with above diagnosis. Prior to admission, pt lives alone in an apartment with intermittent family support. On PT evaluation, pt presents with decreased functional mobility secondary to right eye pain, visual field cut and balance impairments. Ambulating limited hallway distances with no assistive device with up to min assist. Negotiated 5 steps with railing. BP 159/96 post mobility. Compensating for visual field cut fairly well in regards to environmental negotiation. Would recommend home safety evaluation and 24 hour supervision/assist upon discharge.   [place] interpreter utilized for this session.     Follow Up Recommendations Home health PT;Supervision/Assistance - 24 hour    Equipment Recommendations  None recommended by PT    Recommendations for Other Services       Precautions / Restrictions Precautions Precautions: Fall;Other (comment) Precaution Comments: Right visual field cut Restrictions Weight Bearing Restrictions: No      Mobility  Bed Mobility Overal bed mobility: Independent                Transfers Overall transfer level: Needs assistance Equipment used: None Transfers: Sit to/from Stand Sit to Stand: Supervision            Ambulation/Gait Ambulation/Gait assistance: Min guard;Min assist Gait Distance (Feet): 100 Feet Assistive device: None Gait Pattern/deviations: Step-through pattern;Decreased stride length;Drifts right/left Gait velocity: slow   General Gait Details: Pt requiring min guard-min  assist for balance; one episode of mild lateral LOB.  Stairs Stairs: Yes Stairs assistance: Min guard Stair Management: One rail Right Number of Stairs: 5 General stair comments: no difficulty, reciprocal  Wheelchair Mobility    Modified Rankin (Stroke Patients Only) Modified Rankin (Stroke Patients Only) Pre-Morbid Rankin Score: No significant disability Modified Rankin: Moderately severe disability     Balance Overall balance assessment: Mild deficits observed, not formally tested                                           Pertinent Vitals/Pain Pain Assessment: 0-10 Pain Score: 8  Pain Location: right eye Pain Descriptors / Indicators: Grimacing;Guarding Pain Intervention(s): Monitored during session;Patient requesting pain meds-RN notified    Home Living Family/patient expects to be discharged to:: Private residence Living Arrangements: Alone Available Help at Discharge: Family;Available PRN/intermittently Type of Home: Apartment Home Access: Stairs to enter   Entrance Stairs-Number of Steps: flight Home Layout: Two level Home Equipment: None      Prior Function Level of Independence: Independent         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, newspaper   Dominant Hand: Right    Extremity/Trunk Assessment   Upper Extremity Assessment Upper Extremity Assessment: Defer to OT evaluation    Lower Extremity Assessment Lower Extremity Assessment: Overall WFL for tasks assessed    Cervical / Trunk Assessment Cervical / Trunk Assessment: Normal  Communication   Communication: Prefers language other than English([place]namese, limited english)  Cognition Arousal/Alertness: Awake/alert Behavior During Therapy: WFL for tasks assessed/performed Overall Cognitive Status:  Difficult to assess                                        General Comments      Exercises     Assessment/Plan    PT Assessment Patient needs continued  PT services  PT Problem List Decreased strength;Decreased activity tolerance;Decreased balance;Decreased mobility;Decreased knowledge of use of DME;Decreased safety awareness;Decreased cognition;Decreased knowledge of precautions       PT Treatment Interventions Gait training;Stair training;Functional mobility training;Therapeutic activities;Balance training;Therapeutic exercise;Neuromuscular re-education;Cognitive remediation;Patient/family education    PT Goals (Current goals can be found in the Care Plan section)  Acute Rehab PT Goals Patient Stated Goal: less pain PT Goal Formulation: With patient Time For Goal Achievement: 10/15/19 Potential to Achieve Goals: Good    Frequency Min 4X/week   Barriers to discharge Decreased caregiver support      Co-evaluation               AM-PAC PT "6 Clicks" Mobility  Outcome Measure Help needed turning from your back to your side while in a flat bed without using bedrails?: None Help needed moving from lying on your back to sitting on the side of a flat bed without using bedrails?: None Help needed moving to and from a bed to a chair (including a wheelchair)?: A Little Help needed standing up from a chair using your arms (e.g., wheelchair or bedside chair)?: None Help needed to walk in hospital room?: A Little Help needed climbing 3-5 steps with a railing? : A Little 6 Click Score: 21    End of Session Equipment Utilized During Treatment: Gait belt Activity Tolerance: Patient tolerated treatment well Patient left: in bed;with call bell/phone within reach;with bed alarm set Nurse Communication: Mobility status;Patient requests pain meds PT Visit Diagnosis: Unsteadiness on feet (R26.81);Difficulty in walking, not elsewhere classified (R26.2)    Time: [id number] PT Time Calculation (min) (ACUTE ONLY): 30 min   Charges:   PT Evaluation $PT Eval Moderate Complexity: 1 Mod PT Treatments $Therapeutic Activity: 8-22 mins         Ellamae Sia, PT, DPT Acute Rehabilitation Services Pager [phone] Office [phone]   Willy Eddy 10/01/2019, 10:01 AM

## 2019-10-01 NOTE — Progress Notes (Signed)
STROKE TEAM PROGRESS NOTE   INTERVAL HISTORY His therapist and vietnamese electronic interpreter are at the bedside.  Pt is up in the chair. States he feels about the same as yesterday.  I have personally reviewed history of presenting illness, electronic medical records and imaging films in PACS. He states his nephrologist told him never to take aspirin but patient was discharged following his last stroke on aspirin and Plavix. Vitals:   10/01/19 0130 10/01/19 0242 10/01/19 0256 10/01/19 0806  BP: (!) 168/70 (!) 166/78  (!) 151/71  Pulse: 85 81  87  Resp: 10 19  18   Temp:  98.9 F (37.2 C)  99.5 F (37.5 C)  TempSrc:  Oral  Oral  SpO2: 100% 100%  99%  Weight:   67.1 kg     CBC:  Recent Labs  Lab 09/29/19 1753 09/30/19 1013 10/01/19 0414  WBC 9.9  --  10.2  NEUTROABS 6.8  --   --   HGB 11.6* 11.9* 10.5*  HCT 37.6* 35.0* 33.3*  MCV 87.4  --  85.2  PLT 641*  --  494*    Basic Metabolic Panel:  Recent Labs  Lab 09/29/19 1753 09/30/19 1013 10/01/19 0414  NA 138 140 140  K 4.0 4.3 3.9  CL 108 110 110  CO2 24  --  23  GLUCOSE 241* 120* 102*  BUN 30* 29* 31*  CREATININE 3.77* 3.80* 3.52*  CALCIUM 8.5*  --  8.3*   Lipid Panel:     Component Value Date/Time   CHOL 190 10/01/2019 0414   TRIG 137 10/01/2019 0414   HDL 30 (L) 10/01/2019 0414   CHOLHDL 6.3 10/01/2019 0414   VLDL 27 10/01/2019 0414   LDLCALC 133 (H) 10/01/2019 0414   HgbA1c:  Lab Results  Component Value Date   HGBA1C 9.8 (H) 10/01/2019   Urine Drug Screen:     Component Value Date/Time   LABOPIA NONE DETECTED 09/30/2019 1009   COCAINSCRNUR NONE DETECTED 09/30/2019 1009   LABBENZ NONE DETECTED 09/30/2019 1009   AMPHETMU NONE DETECTED 09/30/2019 1009   THCU NONE DETECTED 09/30/2019 1009   LABBARB NONE DETECTED 09/30/2019 1009    Alcohol Level     Component Value Date/Time   ETH <10 09/30/2019 1000    IMAGING Mr Angio Head Wo Contrast  Result Date: 09/30/2019 CLINICAL DATA:  Visual  loss or uveitis/scleritis. EXAM: MRA HEAD WITHOUT CONTRAST TECHNIQUE: Angiographic images of the Circle of Willis were obtained using MRA technique without intravenous contrast. COMPARISON:  Brain MRI performed earlier the same day 09/30/2019, MRA head 08/12/2019 FINDINGS: The examination is moderate to severely motion degraded, which limits evaluation for stenoses and for small aneurysms. The bilateral intracranial internal carotid arteries are patent. Atherosclerotic irregularity of the cavernous/paraclinoid right ICA with at least moderate stenosis. No high-grade stenosis identified within the intracranial left ICA. The M1 right middle cerebral artery is patent, although marked motion degradation precludes adequate evaluation for stenosis. No right MCA M2 proximal branch occlusion is identified. The M1 left middle cerebral artery is patent without high-grade stenosis. No left MCA M2 proximal branch occlusion is identified. 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 The A1 left anterior cerebral artery is patent without significant stenosis. The A2 and more distal anterior cerebral arteries are patent bilaterally. The intracranial vertebral arteries are patent without significant stenosis, as is the basilar artery. The bilateral posterior cerebral  arteries are patent. Redemonstrated moderate focal stenosis within the P2 right PCA, as well as high-grade stenosis in the upper branch of the right PCA more distally. No intracranial aneurysm is identified. IMPRESSION: 1. Significantly motion degraded and limited examination, as detailed. 2. Intracranial atherosclerotic disease with multifocal stenoses, most notably as follows. 3. Redemonstrated at least moderate stenosis within the cavernous/paraclinoid right internal carotid artery. 4. The A1 right anterior cerebral artery is poorly delineated,  although this may be related to developmental hypoplasia and significant motion artifact at this level. 5. Redemonstrated moderate focal stenosis within the P2 right posterior cerebral artery, as well as high-grade stenosis within the upper branch of the right PCA more distally. 6. Please note significant motion degradation precludes adequate evaluation for stenosis within the M1 right middle cerebral artery. Electronically Signed   By: Kellie Simmering DO   On: 09/30/2019 18:20   Mr Brain Wo Contrast  Result Date: 09/30/2019 CLINICAL DATA:  Rapidly progressive right eye visual loss over 3 days. History of hypertensive urgency and stroke in 07/2019 with ulcerated aortic arch plaque. EXAM: MRI HEAD WITHOUT CONTRAST TECHNIQUE: Multiplanar, multiecho pulse sequences of the brain and surrounding structures were obtained without intravenous contrast. COMPARISON:  08/11/2019 FINDINGS: Brain: There are new acute to early subacute infarcts measuring 1 cm in the white matter lateral to the frontal horn of the right lateral ventricle and 2.5 x 0.7 cm in the white matter of the right occipital lobe. Additional punctate cortical and white matter infarcts are noted in the left temporal lobe. There is no residual restricted diffusion associated with the acute infarcts on the prior MRI. Small chronic infarcts are noted in the thalami and cerebellum bilaterally. There is also a chronic lacunar infarct in the left lentiform nucleus which was likely subacute on the prior study. T2 hyperintensities elsewhere in the cerebral white matter and brainstem are nonspecific but compatible with mild chronic small vessel ischemic disease. There are chronic blood products associated with chronic infarcts in the left basal ganglia, right thalamus, and right cerebellum. No mass, midline shift, or extra-axial fluid collection is identified. The ventricles and sulci are within normal limits. A cavum septum pellucidum is noted. Vascular: Major  intracranial vascular flow voids are preserved. Skull and upper cervical spine: Unremarkable bone marrow signal. Sinuses/Orbits: Unremarkable orbits. Paranasal sinuses and mastoid air cells are clear. 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 IMPRESSION: 1. New small acute to early subacute infarcts in the right frontal, right occipital, and left temporal lobes. 2. Chronic small vessel ischemia with multiple old infarcts as above. Electronically Signed   By: Logan Bores M.D.   On: 09/30/2019 11:45    PHYSICAL EXAM Frail middle-aged [place] male not in distress. . Afebrile. Head is nontraumatic. Neck is supple without bruit.    Cardiac exam no murmur or gallop. Lungs are clear to auscultation. Distal pulses are well felt.  Neurological Exam ; exam was done using a [place] language interpreter via video phone Awake  Alert oriented x 3. Normal speech and language.eye movements full without nystagmus.fundi were not visualized. Vision acuity is significantly diminished in the right eye with no light perception.  Pupil is not reactive on the right.  Did not visualize.  Fields appear normal in the left eye.Mar[place] Kitchen Hearing is normal. Palatal movements are normal. Face symmetric. Tongue midline. Normal strength, tone, reflexes and coordination. Normal sensation. Gait deferred.  ASSESSMENT/PLAN [name] is a 48 y.o. male with  history of diabetes, hospitalized 7 wks ago for HTN urgency found to have an embolic acute stroke from an unknown source but possible from an ulcerate aortic arch plaque, started on aspirin and plavix.  Now pt presents with complete vision loss in his R eye with pain and HA.   Stroke:   New small R frontal lobe, R occipital and L temporal lobe infarcts embolic secondary to unknown source R CRAO  MRI  New small R frontal lobe, R occipital and L temporal lobe lobe infarcts. Small vessel disease. Multiple old infarcts.  MRA  Intracranial atherosclerosis  with multiple stenoses: moderate R ICA cavernous, R A1, moderate R P2, high-grade upper distal R PCA. Unable to see previously noted R M1 stenosis d/t motion  Carotid Doppler  B ICA 1-39% stenosis, VAs antegrade   Loop recorder inserted 08/13/19 by DR. Lovena Le . Will have medtronic interrogate for possible atrial fibrillation    LDL 133  HgbA1c 9.8  Lovenox 30 mg sq daily for VTE prophylaxis  aspirin 81 mg daily and clopidogrel 75 mg daily prior to admission, now on aspirin 325 mg daily. Will resume DAPT with plans to stop aspirin in 3 weeks.     Therapy recommendations:  HH PT  Disposition:  pending  (lives alone PTA,works at a nail salon)  Patient advised not to drive until vision improves  History of Stroke  07/2019 - Rsmallcerebellar andbilateral MCA/PCA, right PCA, right thalamic punctateinfarctsembolic secondary to unknownsource - found to have moderate thickness and ulcerated plaque in aortic arch. Placed on DAPT. Loop recorder inserted.   Aortic arch atherosclerosis  TEE 07/2019 showed proximal aortic arch ulcerated plaque  Could be potential source of emboli  Continue DAPT  Stroke risk factor modifications  Hypertension  Stable . Permissive hypertension (OK if < 220/120) but gradually normalize in 5-7 days . 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  Recommend dual antiplatelet therapy for 3 months followed by a Plavix alone..  Aggressive risk factor modification.  Continue ongoing stroke work-up.  Patient was counseled to quit smoking cigarettes and agrees.  Greater than 50% time during this 35-minute visit was spent on counseling and coordination of care about his strokes intracranial atherosclerotic disease and answering questions. Antony Contras, MD To contact Stroke Continuity provider, please refer to [email or web address] After hours, contact General Neurology

## 2019-10-02 ENCOUNTER — Ambulatory Visit: Payer: Self-pay

## 2019-10-02 ENCOUNTER — Other Ambulatory Visit: Payer: Self-pay

## 2019-10-02 DIAGNOSIS — I708 Atherosclerosis of other arteries: Secondary | ICD-10-CM

## 2019-10-02 LAB — BASIC METABOLIC PANEL
Anion gap: 9 (ref 5–15)
BUN: 31 mg/dL — ABNORMAL HIGH (ref 6–20)
CO2: 21 mmol/L — ABNORMAL LOW (ref 22–32)
Calcium: 8.3 mg/dL — ABNORMAL LOW (ref 8.9–10.3)
Chloride: 104 mmol/L (ref 98–111)
Creatinine, Ser: 3.63 mg/dL — ABNORMAL HIGH (ref 0.61–1.24)
GFR calc Af Amer: 22 mL/min — ABNORMAL LOW (ref >60)
GFR calc non Af Amer: 19 mL/min — ABNORMAL LOW (ref >60)
Glucose, Bld: 306 mg/dL — ABNORMAL HIGH (ref 70–99)
Potassium: 4.7 mmol/L (ref 3.5–5.1)
Sodium: 134 mmol/L — ABNORMAL LOW (ref 135–145)

## 2019-10-02 LAB — GLUCOSE, CAPILLARY
Glucose-Capillary: 266 mg/dL — ABNORMAL HIGH (ref 70–99)
Glucose-Capillary: 236 mg/dL — ABNORMAL HIGH (ref 70–99)
Glucose-Capillary: 263 mg/dL — ABNORMAL HIGH (ref 70–99)

## 2019-10-02 LAB — CBC
HCT: 36.4 % — ABNORMAL LOW (ref 39.0–52.0)
Hemoglobin: 11.7 g/dL — ABNORMAL LOW (ref 13.0–17.0)
MCH: 27.1 pg (ref 26.0–34.0)
MCHC: 32.1 g/dL (ref 30.0–36.0)
MCV: 84.3 fL (ref 80.0–100.0)
Platelets: 491 10*3/uL — ABNORMAL HIGH (ref 150–400)
RBC: 4.32 MIL/uL (ref 4.22–5.81)
RDW: 13.2 % (ref 11.5–15.5)
WBC: 8.2 10*3/uL (ref 4.0–10.5)
nRBC: 0 % (ref 0.0–0.2)

## 2019-10-02 MED ORDER — ASPIRIN 81 MG PO TBEC: 81.0000 mg | DELAYED_RELEASE_TABLET | Freq: Every day | ORAL | 0 refills | Status: DC

## 2019-10-02 MED ORDER — INSULIN ASPART 100 UNIT/ML ~~LOC~~ SOLN
0.0000 [IU] | Freq: Three times a day (TID) | SUBCUTANEOUS | Status: DC
  Administered 2019-10-02 (×2): 11 [IU] via SUBCUTANEOUS
  Administered 2019-10-02: 7 [IU] via SUBCUTANEOUS

## 2019-10-02 MED ORDER — SODIUM CHLORIDE 0.9 % IV SOLN
INTRAVENOUS | Status: DC
  Administered 2019-10-02: 10:00:00 via INTRAVENOUS

## 2019-10-02 MED ORDER — LOSARTAN POTASSIUM 50 MG PO TABS
50.0000 mg | ORAL_TABLET | Freq: Every day | ORAL | Status: DC
  Administered 2019-10-02: 50 mg via ORAL
  Filled 2019-10-02: qty 1

## 2019-10-02 NOTE — Discharge Summary (Signed)
Name: Phillip Camacho MRN: [id number] DOB: 11-25-1970 48 y.o. PCP: [name], MD  Date of Admission: 09/29/2019  5:28 PM Date of Discharge: 10/02/2019 Attending Physician: Axel Filler, *  Discharge Diagnosis: 1. Central retinal artery occlusion 2. Acute CVA 3. Diabetes mellitus 4. HTN  Discharge Medications: Allergies as of 10/02/2019   No Known Allergies     Medication List    STOP taking these medications   aspirin 325 MG tablet Replaced by: aspirin 81 MG EC tablet     TAKE these medications   amLODipine 10 MG tablet Commonly known as: NORVASC Take 1 tablet (10 mg total) by mouth daily.   aspirin 81 MG EC tablet Take 1 tablet (81 mg total) by mouth daily. Start taking on: October 03, 2019 Replaces: aspirin 325 MG tablet   atorvastatin 80 MG tablet Commonly known as: LIPITOR Take 1 tablet (80 mg total) by mouth daily at 6 PM.   blood glucose meter kit and supplies Kit Dispense based on patient and insurance preference. Use up to four times daily as directed. (FOR ICD-9 250.00, 250.01).   clopidogrel 75 MG tablet Commonly known as: PLAVIX Take 75 mg by mouth daily.   insulin aspart 100 UNIT/ML FlexPen Commonly known as: NOVOLOG Inject 4 Units into the skin 3 (three) times daily with meals.   Insulin Glargine 100 UNIT/ML Solostar Pen Commonly known as: LANTUS Inject 12 Units into the skin daily. What changed: when to take this   losartan 50 MG tablet Commonly known as: COZAAR Take 50 mg by mouth daily.   nicotine 21 mg/24hr patch Commonly known as: NICODERM CQ - dosed in mg/24 hours Place 1 patch (21 mg total) onto the skin daily.       Disposition and follow-up:   [name] was discharged from [facility] in Stable condition.  At the hospital follow up visit please address:  1.  Central retinal artery occlusion: 2/2 to CVA. Patient to follow up with ophthalmologist on 12/21.  Acute CVA: R frontal, L  temporal and R occipital lobe subacute strokes 2/2 small vessel calcinosis from atherosclerosis. Patient discharged with aspirin and plavix x3 months followed by aspirin alone, amlodipine and losartan for HTN, Atorvastatin 28m for HLD, and continued on home insulin regimen for DM. Please encourage medication compliance and continued smoking cessation. Outpatient PT/OT referrals placed. Pt to follow up in stroke clinic in 6 weeks.   2.  Labs / imaging needed at time of follow-up: BMP, HbA1c in 3 months  3.  Pending labs/ test needing follow-up: none  Follow-up Appointments: Follow-up Information    Camacho, Cammie, MD. Schedule an appointment as soon as possible for a visit in 2 week(s).   Specialty: Family Medicine Contact information: [id number]       [place] Schedule an appointment as soon as possible for a visit in 6 week(s).   Why: Please schedule an appointment in 6 weeks in the stroke clinic.  Contact information: [address] [place] [place] [phone]       Ophthalmology - 12/21  Hospital Course by problem list: 1. Central retinal artery occlusion Pt presented with complete unilateral vision loss, eye pain, and periorbital edema of the right eye. Pt had been seen by ophthalmology prior to admission and recommended he come to the hospital to check for stroke and possible giant cell arteritis. Pt was given IV Salumedrol  infusion on 12/9 for empiric treatment. Neurology was consulted, believe that occlusion most likely due to small vessel calcinosis from atherosclerosis. Pt was discharges with recommendations for outpatient physical therapy and occupational therapy. Pt has a follow up appointment with ophthalmology on 12/21.  2. Acute CVA Pt presented from outpatient ophthalmology to assess for possible stroke. MR brain revealed new early subacute infarcts right frontal, left temporal  and right occipital lobes. MR angiogram of the head showed intracranial atherosclerotic disease with multifocal stenoses, including moderate stenosis within the cavernous/paraclinoid right internal carotid artery. Carotid ultrasound showed 1-39% stenosis of the right and left ICA. Pt EKG showed normal sinus rhythm. Cardiology was consulted to read pt's loop recorder, which found no evidence of atrial fibrillation, decreasing likelihood of emboli formation. 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 At discharge, pt was advised to continue aspirin and clopidogrel, encouraged smoking and alcohol cessation.  3. Diabetes Mellitus Pt was on a regimen for insulin. Hgb A1c was 9.8 at check during admission. Pt's insulin regimen was switched to sliding scale insulin during stay while being treated with salumedrol. At discharge, pt was continued on prior home insulin regimen of aspart 4 units, 3 times daily and glargine 12 units daily.  4. Essential Hypertension Pt has history of hypertension. Pt's at home medications of amlodipine and losartan were held for 48 hours per stroke protocol, as pt was placed in permissive hypertension. After 48 hour period, amlodipine 10 mg was restarted on 12/9 and losartan 50 mg was restarted on 12/10. Pt was discharged on same medication regimen.  Discharge Vitals:   BP (!) 170/77 (BP Location: Right Arm)   Pulse 81   Temp 99.1 F (37.3 C) (Oral)   Resp 18   Wt 67.1 kg   SpO2 100%   BMI 24.62 kg/m   Pertinent Labs, Studies, and Procedures:   CBC Latest Ref Rng & Units 10/02/2019 10/01/2019 09/30/2019  WBC 4.0 - 10.5 K/uL 8.2 10.2 -  Hemoglobin 13.0 - 17.0 g/dL 11.7(L) 10.5(L) 11.9(L)  Hematocrit 39.0 - 52.0 % 36.4(L) 33.3(L) 35.0(L)  Platelets 150 - 400 K/uL 491(H) 494(H) -   BMP Latest Ref Rng & Units 10/02/2019 10/01/2019 09/30/2019  Glucose 70 - 99  mg/dL 306(H) 102(H) 120(H)  BUN 6 - 20 mg/dL 31(H) 31(H) 29(H)  Creatinine 0.61 - 1.24 mg/dL 3.63(H) 3.52(H) 3.80(H)  Sodium 135 - 145 mmol/L 134(L) 140 140  Potassium 3.5 - 5.1 mmol/L 4.7 3.9 4.3  Chloride 98 - 111 mmol/L 104 110 110  CO2 22 - 32 mmol/L 21(L) 23 -  Calcium 8.9 - 10.3 mg/dL 8.3(L) 8.3(L) -   Lipid Panel     Component Value Date/Time   CHOL 190 10/01/2019 0414   TRIG 137 10/01/2019 0414   HDL 30 (L) 10/01/2019 0414   CHOLHDL 6.3 10/01/2019 0414   VLDL 27 10/01/2019 0414   LDLCALC 133 (H) 10/01/2019 0414   HbA1c 9.8 ESR 102, CRP 0.9 Ethanol <10  MR BRAIN WO CONTRAST 09/30/2019:  IMPRESSION: 1. New small acute to early subacute infarcts in the right frontal, right occipital, and left temporal lobes. 2. Chronic small vessel ischemia with multiple old infarcts as above.  MR ANGIO HEAD WO CONTRAST 09/30/2019:  IMPRESSION: 1. Significantly motion degraded and limited examination, as detailed. 2. Intracranial atherosclerotic disease with multifocal stenoses, most notably as follows. 3. Redemonstrated at least moderate stenosis within the cavernous/paraclinoid right internal carotid artery.  4. 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 5. Redemonstrated moderate focal stenosis within the P2 right posterior cerebral artery, as well as high-grade stenosis within the upper branch of the right PCA more distally. 6. Please note significant motion degradation precludes adequate evaluation for stenosis within the M1 right middle cerebral artery.   Discharge Instructions: Discharge Instructions    Ambulatory referral to Neurology   Complete by: As directed    An appointment is requested in approximately: 6 weeks   Call MD for:  difficulty breathing, headache or visual disturbances   Complete by: As directed    Call MD for:  extreme fatigue    Complete by: As directed    Call MD for:  hives   Complete by: As directed    Call MD for:  persistant dizziness or light-headedness   Complete by: As directed    Call MD for:  persistant nausea and vomiting   Complete by: As directed    Call MD for:  redness, tenderness, or signs of infection (pain, swelling, redness, odor or green/yellow discharge around incision site)   Complete by: As directed    Call MD for:  severe uncontrolled pain   Complete by: As directed    Call MD for:  temperature >100.4   Complete by: As directed    Diet - low sodium heart healthy   Complete by: As directed    Discharge instructions   Complete by: As directed    [name], Phillip Camacho were admitted with vision loss in your right eye due to stroke. Your strokes are likely secondary to small vessel disease as a result of long term high blood pressure, diabetes, high cholesterol and smoking. You have stopped smoking which is very good. Please continue to stay away from smoking.  On discharge, please take aspirin and plavix daily for 3 months, your blood pressure medications, cholesterol medications and insulin for your diabetes as instructed. Please schedule an appointment with your primary care provider at your earliest convenience to modify your risk factors and prevent future strokes.  You are being referred for outpatient physical therapy. This will help you in regaining strength over time.  Please follow up in the stroke clinic in 6 weeks.   Thank you!   Increase activity slowly   Complete by: As directed       Signed: Harvie Heck, MD  Internal Medicine, PGY-1 10/02/2019, 4:13 PM   Pager: [phone]

## 2019-10-02 NOTE — Progress Notes (Signed)
Occupational Therapy Treatment Patient Details Name: Phillip Camacho MRN: [id number] DOB: [birth year] Today's Date: 10/02/2019    History of present illness Pt is a 48 y.o. M with significant PMH of CVA, CKD, HTN, and diabetes who presents with a 3 day history of vision loss and associated eye pain. MRI showing new small acute to early subacute infarcts in the right frontal, right occipital, and left temporal lobes.   OT comments  Pt continues to do well compensating for visual deficits. Pt was limited today because of a significant headache.  Medication given during session. Pt make progress toward goals 1-3.  BE FAST education given again.  Pt able to describe symptoms of CVA but did not use the BEFAST acronym.  Pt did well with medication management stating color and purpose for each medication he took today.  Will continue to see pt to increase visual compensation techniques to the Right in more advanced environments.    Follow Up Recommendations  Outpatient OT    Equipment Recommendations  None recommended by OT    Recommendations for Other Services      Precautions / Restrictions Precautions Precautions: Fall;Other (comment) Precaution Comments: R visual field impairement but not a full field cut.   Restrictions Weight Bearing Restrictions: No       Mobility Bed Mobility Overal bed mobility: Independent             General bed mobility comments: brought self to EOB, HOB elevated  Transfers Overall transfer level: Needs assistance Equipment used: None Transfers: Sit to/from Stand Sit to Stand: Supervision         General transfer comment: (S) overall for safety. Pt aware of deficits    Balance Overall balance assessment: Mild deficits observed, not formally tested             Standing balance comment: Pt with some minor loss of balance when walking but did get balance on his own.  Pt did not want to turn head much today due to significant headache.                            ADL either performed or assessed with clinical judgement   ADL Overall ADL's : At baseline                                       General ADL Comments: Pt brushed teeth.  Pt reached down to get water from sink with mouth with face to the right of the faucet and did bump right side of face on faucet.  Talked at length about turning head to R to see more to the R.       Vision   Vision Assessment?: Yes Eye Alignment: Within Functional Limits Ocular Range of Motion: Within Functional Limits Alignment/Gaze Preference: Within Defined Limits Tracking/Visual Pursuits: Able to track stimulus in all quads without difficulty Visual Fields: Right visual field deficit Additional Comments: does not have full R field deficit as L eye can see past midline to R but deficit is present.   Perception     Praxis      Cognition Arousal/Alertness: Awake/alert Behavior During Therapy: WFL for tasks assessed/performed Overall Cognitive Status: Difficult to assess  General Comments: Pt appears to be cognitively intact.  Worked some with interpreter and some without today and pt appears to be baseline with cognition.        Exercises     Shoulder Instructions       General Comments Pt limited today by severe headache.  Pt states it gets worse when he has to talk and pay attn to the interpreter.  Pt did complete medication management identifying all meds by name and color and what they are for.  Meds taken today are meds pt is familiar with from his past stroke.    Pertinent Vitals/ Pain       Pain Assessment: Faces Faces Pain Scale: Hurts whole lot Pain Location: r eye and head ache Pain Descriptors / Indicators: Grimacing;Guarding Pain Intervention(s): Limited activity within patient's tolerance;Monitored during session;Repositioned;Patient requesting pain meds-RN notified;RN gave pain meds during  session  Home Living                                          Prior Functioning/Environment              Frequency  Min 2X/week        Progress Toward Goals  OT Goals(current goals can now be found in the care plan section)  Progress towards OT goals: Progressing toward goals  Acute Rehab OT Goals Patient Stated Goal: for vision to return OT Goal Formulation: With patient Time For Goal Achievement: 10/15/19 Potential to Achieve Goals: Good ADL Goals Additional ADL Goal #1: Pt will demonstrate independence with medication management. Additional ADL Goal #2: Pt will demonstrate independence with 3 low vision strategies during ADL/IADL and functional mobility. Additional ADL Goal #3: Pt will demonstrate independence with verbalizing signs and symptoms of stroke using BE FAST acroynm  Plan Discharge plan remains appropriate    Co-evaluation                 AM-PAC OT "6 Clicks" Daily Activity     Outcome Measure   Help from another person eating meals?: None Help from another person taking care of personal grooming?: None Help from another person toileting, which includes using toliet, bedpan, or urinal?: None Help from another person bathing (including washing, rinsing, drying)?: None Help from another person to put on and taking off regular upper body clothing?: None Help from another person to put on and taking off regular lower body clothing?: None 6 Click Score: 24    End of Session    OT Visit Diagnosis: Muscle weakness (generalized) (M62.81);Low vision, both eyes (H54.2);Pain Pain - Right/Left: Right Pain - part of body: (head)   Activity Tolerance Patient limited by pain   Patient Left with call bell/phone within reach;in chair;with chair alarm set   Nurse Communication Mobility status        Time: [id number] OT Time Calculation (min): 27 min  Charges: OT General Charges $OT Visit: 1 Visit OT Treatments $Self Care/Home  Management : 23-37 mins   Glenford Peers 10/02/2019, 10:44 AM

## 2019-10-02 NOTE — Discharge Instructions (Signed)
Phillip Camacho,  Phillip b? m?t th? l?c ? m?t ph?i do ??t qu?. ??t qu? c?a Phillip c kh? n?ng th? pht sau b?nh m?ch mu nh? do h?u qu? c?a huy?t p cao, ti?u ???ng, cholesterol cao v ht thu?c trong th?i gian di. Phillip ? ng?ng ht thu?c l r?t t?t. Hy ti?p t?c trnh xa ht thu?c. Khi xu?t vi?n, vui lng u?ng aspirin v plavix hng ngy trong 3 thng, thu?c huy?t p, thu?c gi?m cholesterol v insulin cho b?nh ti?u ???ng c?a Phillip theo h??ng d?n. Vui lng ??t l?ch h?n v?i nh cung c?p d?ch v? ch?m [place] chnh c?a Phillip s?m nh?t c th? ?? s?a ??i cc y?u t? nguy c? v ng?n ng?a ??t qu? trong t??ng lai. Phillip ?ang ???c gi?i thi?u ?? ?i?u tr? v?t l tr? li?u ngo?i tr. ?i?u ny s? gip Phillip l?y l?i s?c m?nh theo th?i gian. Vui lng theo di t?i phng khm ??t qu? sau 6 tu?n.  C?m ?n Phillip!

## 2019-10-02 NOTE — Progress Notes (Signed)
NURSING PROGRESS NOTE  AKSH SWART [id number] Discharge Data: 10/02/2019 6:25 PM Attending Provider: Axel Filler, * [id number], Ander Gaster, MD     Geoffry Paradise discharged to private residence per MD order.  Discussed with the patient the After Visit Summary and all questions fully answered. All IV's discontinued with no bleeding noted. All belongings returned to patient for patient to take home.   Last Vital Signs:  Blood pressure (!) 170/77, pulse 81, temperature 99.1 F (37.3 C), temperature source Oral, resp. rate 18, weight 67.1 kg, SpO2 100 %.  Discharge Medication List Allergies as of 10/02/2019   No Known Allergies     Medication List    STOP taking these medications   aspirin 325 MG tablet Replaced by: aspirin 81 MG EC tablet     TAKE these medications   amLODipine 10 MG tablet Commonly known as: NORVASC Take 1 tablet (10 mg total) by mouth daily.   aspirin 81 MG EC tablet Take 1 tablet (81 mg total) by mouth daily. Start taking on: October 03, 2019 Replaces: aspirin 325 MG tablet   atorvastatin 80 MG tablet Commonly known as: LIPITOR Take 1 tablet (80 mg total) by mouth daily at 6 PM.   blood glucose meter kit and supplies Kit Dispense based on patient and insurance preference. Use up to four times daily as directed. (FOR ICD-9 250.00, 250.01).   clopidogrel 75 MG tablet Commonly known as: PLAVIX Take 75 mg by mouth daily.   insulin aspart 100 UNIT/ML FlexPen Commonly known as: NOVOLOG Inject 4 Units into the skin 3 (three) times daily with meals.   Insulin Glargine 100 UNIT/ML Solostar Pen Commonly known as: LANTUS Inject 12 Units into the skin daily. What changed: when to take this   losartan 50 MG tablet Commonly known as: COZAAR Take 50 mg by mouth daily.   nicotine 21 mg/24hr patch Commonly known as: NICODERM CQ - dosed in mg/24 hours Place 1 patch (21 mg total) onto the skin daily.

## 2019-10-02 NOTE — Progress Notes (Signed)
Subjective:  No events overnight. Pt was resting comfortably this morning. Pt reports he "eats and drinks a little but not much." Pt reports that when he lies down, his headache around his eye is worse, but it gets better when he sits up. Pt asks about his blood pressure and reports it is never high at home but always high in the hospital.   Objective:  Vital signs in last 24 hours: Vitals:   10/01/19 1957 10/01/19 2356 10/02/19 0401 10/02/19 0758  BP: (!) 164/70 (!) 171/75 (!) 176/83 (!) 176/76  Pulse: 86 83 79 79  Resp: 19 19 18 18   Temp: 98.2 F (36.8 C) 98 F (36.7 C) 98 F (36.7 C) 98.4 F (36.9 C)  TempSrc: Oral Oral Oral Oral  SpO2: 100% 99% 100% 100%  Weight:       Weight change:   Intake/Output Summary (Last 24 hours) at 10/02/2019 1039 Last data filed at 10/02/2019 0900 Gross per 24 hour  Intake 916 ml  Output -  Net 916 ml   Physical Exam Constitutional:      General: He is not in acute distress.    Appearance: He is not diaphoretic.  HENT:     Head: Normocephalic and atraumatic.  Eyes:     Conjunctiva/sclera: Conjunctivae normal.     Comments: Swelling improved from yesterday, less erythema. not as protruding.   Neurological:     General: No focal deficit present.     Mental Status: He is alert. Mental status is at baseline.  Psychiatric:        Mood and Affect: Mood normal.    Labs:   Na: 134, CO3: 21, Glucose: 306, BUN: 31, Cr: 3.63, Ca: 8.3, GFR 19  Hgb: 11.7, Hct 36.4, Platelets: 491  Assessment/Plan:  Principal Problem:   Central retinal artery occlusion Active Problems:   Acute CVA (cerebrovascular accident) ([place])   DM (diabetes mellitus) ([place])   Benign hypertension with chronic kidney disease, stage III  Phillip Camacho is a 48 year old male with a PMHx ofHTN, diabetes,and CVA 7 weeks agowho presents with a 3 day history of vision loss, eye pain and periorbital edema most likely due to thrombi formation due to athersclerotic plaques  secondary to endothelial damage from diabetes and hypertension, potential emboli from arrhythmia, or vascular occlusion 2/2 giant cell arteritis  Central retinal artery occlusion/periorbital edema Pt's complete unilateral vision loss as well as retinal imaging suggests retina artery occlusion. Neurology consult suggests small vessel athersclerosis and calicinosis is most likely cause of occlusion and multiple cerebral infarcts. EKG normal and loop recorder data was negative for atrial fibrillation, which decreases likelihood of emboli formation causing the occlusion. GCA is less likely based on risk factors being supporting evidence for atherosclerotic disease. - Consulted w/ Ophthalmology, greatly appreciate recs. - D/C salumedrol  - Cardiology provided loop recorder data, no A fib found - Will order Tylenol 650 mg PRN and Dilaudid 0.5 mg for eye pain  Acute CVA: Pt has multiple subacute infarcts bilaterally, suggesting multiple new CVA events. Pt was started on lovenox 30 mg subQ, twice daily. Cardiology reported that loop recorder shows no signs of A fib, making emboli formation a less likely cause. Could still be thrombi formed due to athersclerotic plaque, supported by risk factors of DM and HTN. Neurology consult supports thrombsis 2/2 small vessel atherosclerosis and calcinosis. - Neurology on board, greatly appreciate recs - Physical therapy on board, greatly appreciate recs - Occupational therapy on board, greatly appreciate recs.  Diabetes mellitus: Last Hgb A1c: 9.8. poorly-controlled, pt is on insulin regimen. Newly diagnosed with neovascular glaucoma. - continue to monitor blood glucose - Will transition to SSI after salumetrol tx  Essential hypertension: Blood pressures elevated. No longer in permissive hypertension window - Continue amlodipine 10 mg once daily - Will restart losartan 50 mg once daily - Continue to monitor   LOS: 2 days   Carin Primrose, Medical  Student 10/02/2019, 10:39 AM

## 2019-10-02 NOTE — Progress Notes (Signed)
STROKE TEAM PROGRESS NOTE   INTERVAL HISTORY His Rn is at the bedside.  Pt is up in the chair. States he feels about the same as yesterday.  No neurological changes Vitals:   10/02/19 0401 10/02/19 0758 10/02/19 1059 10/02/19 1146  BP: (!) 176/83 (!) 176/76  (!) 171/85  Pulse: 79 79  72  Resp: 18 18 10 18   Temp: 98 F (36.7 C) 98.4 F (36.9 C)  98.2 F (36.8 C)  TempSrc: Oral Oral  Oral  SpO2: 100% 100%  100%  Weight:        CBC:  Recent Labs  Lab 09/29/19 1753 10/01/19 0414 10/02/19 0408  WBC 9.9 10.2 8.2  NEUTROABS 6.8  --   --   HGB 11.6* 10.5* 11.7*  HCT 37.6* 33.3* 36.4*  MCV 87.4 85.2 84.3  PLT 641* 494* 491*    Basic Metabolic Panel:  Recent Labs  Lab 10/01/19 0414 10/02/19 0408  NA 140 134*  K 3.9 4.7  CL 110 104  CO2 23 21*  GLUCOSE 102* 306*  BUN 31* 31*  CREATININE 3.52* 3.63*  CALCIUM 8.3* 8.3*   Lipid Panel:     Component Value Date/Time   CHOL 190 10/01/2019 0414   TRIG 137 10/01/2019 0414   HDL 30 (L) 10/01/2019 0414   CHOLHDL 6.3 10/01/2019 0414   VLDL 27 10/01/2019 0414   LDLCALC 133 (H) 10/01/2019 0414   HgbA1c:  Lab Results  Component Value Date   HGBA1C 9.8 (H) 10/01/2019   Urine Drug Screen:     Component Value Date/Time   LABOPIA NONE DETECTED 09/30/2019 1009   COCAINSCRNUR NONE DETECTED 09/30/2019 1009   LABBENZ NONE DETECTED 09/30/2019 1009   AMPHETMU NONE DETECTED 09/30/2019 1009   THCU NONE DETECTED 09/30/2019 1009   LABBARB NONE DETECTED 09/30/2019 1009    Alcohol Level     Component Value Date/Time   ETH <10 09/30/2019 1000    IMAGING MR ANGIO HEAD WO CONTRAST  Result Date: 09/30/2019 CLINICAL DATA:  Visual loss or uveitis/scleritis. EXAM: MRA HEAD WITHOUT CONTRAST TECHNIQUE: Angiographic images of the Circle of Willis were obtained using MRA technique without intravenous contrast. COMPARISON:  Brain MRI performed earlier the same day 09/30/2019, MRA head 08/12/2019 FINDINGS: The examination is moderate to  severely motion degraded, which limits evaluation for stenoses and for small aneurysms. The bilateral intracranial internal carotid arteries are patent. Atherosclerotic irregularity of the cavernous/paraclinoid right ICA with at least moderate stenosis. No high-grade stenosis identified within the intracranial left ICA. The M1 right middle cerebral artery is patent, although marked motion degradation precludes adequate evaluation for stenosis. No right MCA M2 proximal branch occlusion is identified. The M1 left middle cerebral artery is patent without high-grade stenosis. No left MCA M2 proximal branch occlusion is identified. 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 The A1 left anterior cerebral artery is patent without significant stenosis. The A2 and more distal anterior cerebral arteries are patent bilaterally. The intracranial vertebral arteries are patent without significant stenosis, as is the basilar artery. The bilateral posterior cerebral arteries are patent. Redemonstrated moderate focal stenosis within the P2 right PCA, as well as high-grade stenosis in the upper branch of the right PCA more distally. No intracranial aneurysm is identified. IMPRESSION: 1. Significantly motion degraded and limited examination, as detailed. 2. Intracranial atherosclerotic disease with multifocal stenoses, most notably as follows. 3. Redemonstrated at least  moderate stenosis within the cavernous/paraclinoid right internal carotid artery. 4. 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 5. Redemonstrated moderate focal stenosis within the P2 right posterior cerebral artery, as well as high-grade stenosis within the upper branch of the right PCA more distally. 6. Please note significant  motion degradation precludes adequate evaluation for stenosis within the M1 right middle cerebral artery. Electronically Signed   By: Kellie Simmering DO   On: 09/30/2019 18:20   VAS US CAROTID  Result Date: 10/01/2019 Carotid Arterial Duplex Study Indications:       Visual disturbance and Sudden loss of vision in right eye. Comparison Study:  No priors. Performing Technologist: Oda Cogan RDMS, RVT  Examination Guidelines: A complete evaluation includes B-mode imaging, spectral Doppler, color Doppler, and power Doppler as needed of all accessible portions of each vessel. Bilateral testing is considered an integral part of a complete examination. Limited examinations for reoccurring indications may be performed as noted.  Right Carotid Findings: +----------+--------+--------+--------+------------------+------------------+           PSV cm/sEDV cm/sStenosisPlaque DescriptionComments           +----------+--------+--------+--------+------------------+------------------+ CCA Prox  94      10                                                   +----------+--------+--------+--------+------------------+------------------+ CCA Distal76      14                                                   +----------+--------+--------+--------+------------------+------------------+ ICA Prox  37      13                                intimal thickening +----------+--------+--------+--------+------------------+------------------+ ICA Mid   45      15                                                   +----------+--------+--------+--------+------------------+------------------+ ICA Distal70      22                                                   +----------+--------+--------+--------+------------------+------------------+ ECA       384     40                                                   +----------+--------+--------+--------+------------------+------------------+  +----------+--------+-------+----------------+-------------------+           PSV cm/sEDV cmsDescribe        Arm Pressure (mmHG) +----------+--------+-------+----------------+-------------------+ Subclavian150            Multiphasic, WNL                    +----------+--------+-------+----------------+-------------------+ +---------+--------+--+--------+--+---------+  VertebralPSV cm/s69EDV cm/s19Antegrade +---------+--------+--+--------+--+---------+  Left Carotid Findings: +----------+--------+--------+--------+------------------+------------------+           PSV cm/sEDV cm/sStenosisPlaque DescriptionComments           +----------+--------+--------+--------+------------------+------------------+ CCA Prox  114     23                                                   +----------+--------+--------+--------+------------------+------------------+ CCA Distal94      23                                                   +----------+--------+--------+--------+------------------+------------------+ ICA Prox  68      25      1-39%                     intimal thickening +----------+--------+--------+--------+------------------+------------------+ ICA Mid   79      25                                                   +----------+--------+--------+--------+------------------+------------------+ ICA Distal80      27                                                   +----------+--------+--------+--------+------------------+------------------+ ECA       165     17                                                   +----------+--------+--------+--------+------------------+------------------+ +----------+--------+--------+----------------+-------------------+           PSV cm/sEDV cm/sDescribe        Arm Pressure (mmHG) +----------+--------+--------+----------------+-------------------+ Subclavian                Multiphasic, WNL                     +----------+--------+--------+----------------+-------------------+ +---------+--------+---+--------+--+---------+ VertebralPSV cm/s106EDV cm/s24Antegrade +---------+--------+---+--------+--+---------+  Summary: Right Carotid: Velocities in the right ICA are consistent with a 1-39% stenosis. Left Carotid: Velocities in the left ICA are consistent with a 1-39% stenosis. Vertebrals: Bilateral vertebral arteries demonstrate antegrade flow. *See table(s) above for measurements and observations.  Electronically signed by Curt Jews MD on 10/01/2019 at 8:46:33 PM.    Final     PHYSICAL EXAM Frail middle-aged [place] male not in distress. . Afebrile. Head is nontraumatic. Neck is supple without bruit.    Cardiac exam no murmur or gallop. Lungs are clear to auscultation. Distal pulses are well felt.  Neurological Exam ;   Awake  Alert oriented x 3. Normal speech and language.eye movements full without nystagmus.fundi were not visualized. Vision acuity is significantly diminished in the right eye with no light perception.  Pupil is not reactive on the right.  Fundi not visualized.  Fields appear normal in the  left eye.Marland Kitchen Hearing is normal. Palatal movements are normal. Face symmetric. Tongue midline. Normal strength, tone, reflexes and coordination. Normal sensation. Gait deferred.  ASSESSMENT/PLAN [name] is a 48 y.o. male with history of diabetes, hospitalized 7 wks ago for HTN urgency found to have an embolic acute stroke from an unknown source but possible from an ulcerate aortic arch plaque, started on aspirin and plavix.  Now pt presents with complete vision loss in his R eye with pain and HA.   Stroke:   New small R frontal lobe, R occipital and L temporal lobe infarcts embolic secondary to unknown source R CRAO  MRI  New small R frontal lobe, R occipital and L temporal lobe lobe infarcts. Small vessel disease. Multiple old infarcts.  MRA  Intracranial atherosclerosis with multiple  stenoses: moderate R ICA cavernous, R A1, moderate R P2, high-grade upper distal R PCA. Unable to see previously noted R M1 stenosis d/t motion  Carotid Doppler  B ICA 1-39% stenosis, VAs antegrade   Loop recorder inserted 08/13/19 by DR. Lovena Le .  No evidence of paroxysmal A. fib found during interrogation of loop recorder in the present admission  LDL 133  HgbA1c 9.8  Lovenox 30 mg sq daily for VTE prophylaxis  aspirin 81 mg daily and clopidogrel 75 mg daily prior to admission, now on aspirin 325 mg daily. Will resume DAPT with plans to stop aspirin in 3 weeks.     Therapy recommendations:  HH PT  Disposition:  pending  (lives alone PTA,works at a nail salon)  Patient advised not to drive until vision improves  History of Stroke  07/2019 - Rsmallcerebellar andbilateral MCA/PCA, right PCA, right thalamic punctateinfarctsembolic secondary to unknownsource - found to have moderate thickness and ulcerated plaque in aortic arch. Placed on DAPT. Loop recorder inserted.   Aortic arch atherosclerosis  TEE 07/2019 showed proximal aortic arch ulcerated plaque  Could be potential source of emboli  Continue DAPT  Stroke risk factor modifications  Hypertension  Stable . Permissive hypertension (OK if < 220/120) but gradually normalize in 5-7 days . 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  Recommend dual antiplatelet therapy for 3 months followed by a Plavix alone..  Aggressive risk factor modification.   .  Patient was counseled to quit smoking cigarettes and agrees.  Follow-up as an outpatient stroke clinic in 6 weeks.  Stroke team will sign off.  Kindly call for questions.  Antony Contras, MD To contact Stroke Continuity provider, please refer to [email or web address] After hours, contact General Neurology

## 2019-10-02 NOTE — Progress Notes (Signed)
Inpatient Diabetes Program Recommendations  AACE/ADA: New Consensus Statement on Inpatient Glycemic Control (2015)  Target Ranges:  Prepandial:   less than 140 mg/dL      Peak postprandial:   less than 180 mg/dL (1-2 hours)      Critically ill patients:  140 - 180 mg/dL   Lab Results  Component Value Date   GLUCAP 266 (H) 10/02/2019   HGBA1C 9.8 (H) 10/01/2019    Review of Glycemic Control Results for Phillip Camacho, Phillip Camacho (MRN [id number]) as of 10/02/2019 09:04  Ref. Range 10/01/2019 06:42 10/01/2019 11:48 10/01/2019 16:28 10/01/2019 21:08 10/02/2019 06:27  Glucose-Capillary Latest Ref Range: 70 - 99 mg/dL 100 (H) 90 123 (H) 111 (H) 266 (H)    Diabetes history: DM 2 Outpatient Diabetes medications: Lantus 8 units, Novolog 4 units tid  Current orders for Inpatient glycemic control: Lantus 8 units, Novolog 0-20 units tid + hs  A1c 9.8% on 12/9 lower than before (11.6% on 10/19)  Inpatient Diabetes Program Recommendations:    Noted Solumedrol started yesterday. Novolog Correction increased to resistant. Fasting glucose 266 this am.  May consider increase in lantus dose if pt is to remain on steroid dose for now.  Spoke with pt yesterday see note from 12/9.  Thanks,  Tama Headings RN, MSN, BC-ADM Inpatient Diabetes Coordinator Team Pager [phone] (8a-5p)

## 2019-10-02 NOTE — Progress Notes (Signed)
Physical Therapy Treatment Patient Details Name: Phillip Camacho MRN: [id number] DOB: 08/04/1971 Today's Date: 10/02/2019    History of Present Illness Pt is a 48 y.o. M with significant PMH of CVA, CKD, HTN, and diabetes who presents with a 3 day history of vision loss and associated eye pain. MRI showing new small acute to early subacute infarcts in the right frontal, right occipital, and left temporal lobes.    PT Comments    Pt reporting headache/right eye pain but agreeable to participate in somewhat limited therapy session (premedicated prior). Pt with improved balance, ambulating 120 feet with no assistive device at a min guard assist level. Focused on static standing balance exercises. Pt able to locate rooms/signage on right side of hallway upon return to room and compensating for visual deficits well. Would benefit from OPPT to address deficits.     Follow Up Recommendations  Outpatient PT;Supervision for mobility/OOB     Equipment Recommendations  None recommended by PT    Recommendations for Other Services       Precautions / Restrictions Precautions Precautions: Fall;Other (comment) Precaution Comments: Right visual field cut Restrictions Weight Bearing Restrictions: No    Mobility  Bed Mobility Overal bed mobility: Independent             General bed mobility comments: brought self to EOB, HOB elevated  Transfers Overall transfer level: Needs assistance Equipment used: None Transfers: Sit to/from Stand Sit to Stand: Supervision         General transfer comment: (S) overall for safety. Pt aware of deficits  Ambulation/Gait Ambulation/Gait assistance: Min guard Gait Distance (Feet): 120 Feet Assistive device: None Gait Pattern/deviations: Step-through pattern;Decreased stride length;Narrow base of support Gait velocity: slow   General Gait Details: Cues for wider BOS, min guard for stability, no overt LOB today   Stairs              Wheelchair Mobility    Modified Rankin (Stroke Patients Only) Modified Rankin (Stroke Patients Only) Pre-Morbid Rankin Score: No significant disability Modified Rankin: Moderately severe disability     Balance Overall balance assessment: Mild deficits observed, not formally tested             Standing balance comment: Pt with some minor loss of balance when walking but did get balance on his own.  Pt did not want to turn head much today due to significant headache.                            Cognition Arousal/Alertness: Awake/alert Behavior During Therapy: WFL for tasks assessed/performed Overall Cognitive Status: Difficult to assess                                 General Comments: Pt appears to be cognitively intact.  Worked some with interpreter and some without today and pt appears to be baseline with cognition.      Exercises General Exercises - Lower Extremity Mini-Sqauts: 10 reps;Both;Standing Other Exercises Other Exercises: Static standing balance with varying levels of hand support including tandem, rhomberg, and SLS with eyes open and closed    General Comments General comments (skin integrity, edema, etc.): Pt limited today by severe headache.  Pt states it gets worse when he has to talk and pay attn to the interpreter.  Pt did complete medication management identifying all meds by name and color and what they  are for.  Meds taken today are meds pt is familiar with from his past stroke.      Pertinent Vitals/Pain Pain Assessment: Faces Faces Pain Scale: Hurts little more Pain Location: right eye, eadache Pain Descriptors / Indicators: Grimacing;Guarding Pain Intervention(s): Premedicated before session;Limited activity within patient's tolerance    Home Living                      Prior Function            PT Goals (current goals can now be found in the care plan section) Acute Rehab PT Goals Patient Stated  Goal: less pain PT Goal Formulation: With patient Time For Goal Achievement: 10/15/19 Potential to Achieve Goals: Good Progress towards PT goals: Progressing toward goals    Frequency    Min 4X/week      PT Plan Discharge plan needs to be updated    Co-evaluation              AM-PAC PT "6 Clicks" Mobility   Outcome Measure  Help needed turning from your back to your side while in a flat bed without using bedrails?: None Help needed moving from lying on your back to sitting on the side of a flat bed without using bedrails?: None Help needed moving to and from a bed to a chair (including a wheelchair)?: A Little Help needed standing up from a chair using your arms (e.g., wheelchair or bedside chair)?: None Help needed to walk in hospital room?: A Little Help needed climbing 3-5 steps with a railing? : A Little 6 Click Score: 21    End of Session Equipment Utilized During Treatment: Gait belt Activity Tolerance: Patient tolerated treatment well Patient left: in bed;with call bell/phone within reach;with bed alarm set Nurse Communication: Mobility status;Patient requests pain meds PT Visit Diagnosis: Unsteadiness on feet (R26.81);Difficulty in walking, not elsewhere classified (R26.2)     Time: [id number] PT Time Calculation (min) (ACUTE ONLY): 18 min  Charges:  $Therapeutic Activity: 8-22 mins                     Ellamae Sia, PT, DPT Acute Rehabilitation Services Pager [phone] Office [phone]    Willy Eddy 10/02/2019, 11:09 AM

## 2019-10-02 NOTE — Patient Outreach (Signed)
Phillip Camacho) Care Management  10/02/2019  Phillip Camacho 1971-05-01 [id number]   [facility] case closure.  See note from Joellyn Quails on 10/01/19.  Ronn Melena, BSW Social Worker [phone]

## 2019-10-03 ENCOUNTER — Ambulatory Visit: Payer: BLUE CROSS/BLUE SHIELD | Admitting: Family Medicine

## 2019-10-06 ENCOUNTER — Ambulatory Visit: Payer: BLUE CROSS/BLUE SHIELD | Admitting: *Deleted

## 2019-10-07 ENCOUNTER — Other Ambulatory Visit: Payer: Self-pay | Admitting: *Deleted

## 2019-10-07 NOTE — Patient Outreach (Signed)
[facility]) Care Management  10/07/2019  Phillip Camacho 03/16/1971 [id number]     EMMI- stroke- received by [facility] RN CM 10/06/19 [facility] on 10/02/19 RED ON EMMI ALERT Day # 1 Date: Sunday 10/05/19 1001 Patient Red Alert Reason: feeling worse overall? Yes   Insurance: blue cross and blue shield  [facility] admissions x 2 ED visits x 2 in the last 6 months  09/29/19- 10/02/19 central retinal artery occlusion, acute CVA (R frontal, L temporal and R occipital lobe subacute strokes 2/2 small vessel calcinosis from atherosclerosis), DM, HTN  Outreach attempt #1 to his mobile/home number unsuccessful  A message stating the patient was not receiving calls at this time heard  [facility] RN CM called his daughter Phillip Camacho successfully Phillip Camacho is vietnamese. He speaks vietnamese and some english. There is some language barrier. He gave permission  Phillip Camacho is able to verify HIPAA, DOB and address [facility] Care Management RN reviewed and addressed red alert with Phillip Camacho   EMMI:   Pt phone disconnected after EMMI  #1 call  Phillip Camacho Daughter who remains in [place] ans his son, Phillip Camacho is assisting as much as possible in [place] reports pt has not felt well but unable to describe his s/s at this time. calling the [facility], [place] RN CM or 24 hour nurse line was encouraged after she assessed the s/s  Phillip Camacho was given again the contact numbers for [facility] RN CM & SW, [facility] RN CM and the 24 hour nurse line. She voiced understanding    [facility] RN CM discussed with her that [facility] will be the primary care management provider but [facility] RN CM received the referral for EMMI stroke and will contact if there are red alerts Prn  Phillip Camacho voiced understanding   Social:Phillip Camacho is a 48 year old single [place] patient who reports living alone He was independent with his care needs prior to last hospitalization. He reports drives to his own medical appointments    DME: Glucose meter, [employer] BP monitor    Conditions: central retinal artery occlusion, acute CVA (R frontal, L temporal and R occipital lobe subacute strokes 2/2 small vessel calcinosis from atherosclerosis, Acute CVA/infarct in the right cerebellum and bilateral MCA/PCA, right PCA, right thalamic punctate infarcts embolic secondary to unknown sources, HTN, HLD,DM (hgA1c of 11.6), Tobacco abuse, loop recorder placedon 08/13/19  Appointments: 10/10/19 [place] [facility] [facility])   Advance Directives: Denies need for assist with advance directives   Consent: [facility] RN CM reviewed [facility] services with patient. Patient gave verbal consent for services [facility] telephonic RN CM.   Advised patient that there will be further automated EMMI- post discharge calls to assess how the patient is doing following the recent hospitalization Advised the patient that another call may be received from a nurse if any of their responses were abnormal. Patient voiced understanding and was appreciative of f/u call.   Plan: [facility] RN CM will follow up with Phillip Camacho and/or his daughter, Phillip Camacho within 7-14 business days  [facility] ([place] [facility]) RN CM and SW updated that Phillip Camacho phone is disconnected and the daughter is to be reached as the primary contact Routed note to MD  [place]. Lavina Hamman, RN, BSN, [place] Coordinator Office number [phone] Mobile number [phone]  Main [facility] number [phone] Fax number [phone]

## 2019-10-10 ENCOUNTER — Ambulatory Visit: Payer: BLUE CROSS/BLUE SHIELD | Admitting: Family Medicine

## 2019-10-14 ENCOUNTER — Emergency Department (HOSPITAL_COMMUNITY): Payer: BLUE CROSS/BLUE SHIELD

## 2019-10-14 ENCOUNTER — Other Ambulatory Visit: Payer: Self-pay

## 2019-10-14 ENCOUNTER — Encounter (HOSPITAL_COMMUNITY): Payer: Self-pay | Admitting: Emergency Medicine

## 2019-10-14 ENCOUNTER — Emergency Department (HOSPITAL_COMMUNITY)
Admission: EM | Admit: 2019-10-14 | Discharge: 2019-10-14 | Disposition: A | Discharge status: Home / Self Care | Payer: BLUE CROSS/BLUE SHIELD | Attending: Emergency Medicine | Admitting: Emergency Medicine

## 2019-10-14 DIAGNOSIS — N1832 Chronic kidney disease, stage 3b: Secondary | ICD-10-CM | POA: Diagnosis not present

## 2019-10-14 DIAGNOSIS — Z8673 Personal history of transient ischemic attack (TIA), and cerebral infarction without residual deficits: Secondary | ICD-10-CM | POA: Diagnosis not present

## 2019-10-14 DIAGNOSIS — Z79899 Other long term (current) drug therapy: Secondary | ICD-10-CM | POA: Diagnosis not present

## 2019-10-14 DIAGNOSIS — L03213 Periorbital cellulitis: Secondary | ICD-10-CM | POA: Insufficient documentation

## 2019-10-14 DIAGNOSIS — I129 Hypertensive chronic kidney disease with stage 1 through stage 4 chronic kidney disease, or unspecified chronic kidney disease: Secondary | ICD-10-CM | POA: Insufficient documentation

## 2019-10-14 DIAGNOSIS — F17210 Nicotine dependence, cigarettes, uncomplicated: Secondary | ICD-10-CM | POA: Diagnosis not present

## 2019-10-14 DIAGNOSIS — Z7982 Long term (current) use of aspirin: Secondary | ICD-10-CM | POA: Diagnosis not present

## 2019-10-14 DIAGNOSIS — Z794 Long term (current) use of insulin: Secondary | ICD-10-CM | POA: Diagnosis not present

## 2019-10-14 DIAGNOSIS — H5711 Ocular pain, right eye: Secondary | ICD-10-CM | POA: Diagnosis present

## 2019-10-14 DIAGNOSIS — Z7901 Long term (current) use of anticoagulants: Secondary | ICD-10-CM | POA: Diagnosis not present

## 2019-10-14 DIAGNOSIS — E1122 Type 2 diabetes mellitus with diabetic chronic kidney disease: Secondary | ICD-10-CM | POA: Insufficient documentation

## 2019-10-14 MED ORDER — KETOROLAC TROMETHAMINE 60 MG/2ML IM SOLN
15.0000 mg | Freq: Once | INTRAMUSCULAR | Status: AC
  Administered 2019-10-14: 15 mg via INTRAMUSCULAR
  Filled 2019-10-14: qty 2

## 2019-10-14 MED ORDER — LOSARTAN POTASSIUM 50 MG PO TABS
50.0000 mg | ORAL_TABLET | Freq: Every day | ORAL | Status: DC
  Administered 2019-10-14: 50 mg via ORAL
  Filled 2019-10-14 (×2): qty 1

## 2019-10-14 MED ORDER — CLINDAMYCIN HCL 150 MG PO CAPS
450.0000 mg | ORAL_CAPSULE | Freq: Once | ORAL | Status: AC
  Administered 2019-10-14: 450 mg via ORAL
  Filled 2019-10-14: qty 3

## 2019-10-14 MED ORDER — CLINDAMYCIN HCL 150 MG PO CAPS: 450.0000 mg | ORAL_CAPSULE | Freq: Three times a day (TID) | ORAL | 0 refills | Status: AC

## 2019-10-14 MED ORDER — AMLODIPINE BESYLATE 5 MG PO TABS
10.0000 mg | ORAL_TABLET | Freq: Every day | ORAL | Status: DC
  Administered 2019-10-14: 10 mg via ORAL
  Filled 2019-10-14 (×2): qty 2

## 2019-10-14 MED ORDER — FLUORESCEIN SODIUM 1 MG OP STRP
1.0000 | ORAL_STRIP | Freq: Once | OPHTHALMIC | Status: AC
  Administered 2019-10-14: 1 via OPHTHALMIC
  Filled 2019-10-14: qty 1

## 2019-10-14 MED ORDER — ERYTHROMYCIN 5 MG/GM OP OINT: TOPICAL_OINTMENT | OPHTHALMIC | 0 refills | Status: DC

## 2019-10-14 NOTE — ED Triage Notes (Addendum)
Pt to triage via [facility] from home.  C/o HTN- took his BP meds this morning.  Also c/o swelling to R eye with pupil dilation.  Denies injury.  BP for EMS 210/82.  No neuro deficits on triage exam.

## 2019-10-14 NOTE — ED Notes (Signed)
Got patient undress on the monitor patient is resting with call bell in reach 

## 2019-10-14 NOTE — Discharge Instructions (Addendum)
Ch?p CT khng cho th?y b? nhi?m trng sau m?t. Thng th??ng, chng ti ?i?u tr? ?i?u ny b?ng thu?c khng sinh. Vui lng ti khm v?i bc s? nhn khoa c?a b?n m b?n ? g?p t?i v?n phng tr??c ?y. N?u b?n khng th? tm th?y thng tin c?a c ?y ho?c b?n khng ch?c ?y l ai th ti ? cung c?p thng tin cho bc s? nhn khoa ?ang tr?c hm nay. Vui lng quay l?i n?u b? s?t ho?c m?n ?? lan r?ng. S? d?ng thu?c khng sinh theo quy ??nh.

## 2019-10-14 NOTE — ED Notes (Signed)
Patient states he did not take bp meds this morning.

## 2019-10-14 NOTE — ED Notes (Signed)
Patient transported to CT 

## 2019-10-14 NOTE — ED Notes (Signed)
Dark green, light green, and lavender save tubes drawn when IV was sited. Lavender and light green tubes sent to main lab, dark green placed in mini lab.

## 2019-10-14 NOTE — ED Provider Notes (Signed)
[facility] EMERGENCY DEPARTMENT Provider Note   CSN: [id number] Arrival date & time: 10/14/19  1110     History Chief Complaint  Patient presents with  . Hypertension  . Eye Problem    Phillip Camacho is a 48 y.o. male.  48 yo M with a chief complaints of right eyelid swelling.  Going on for about 48 hours.  No fevers no trauma.  Describes the area is painful.  States he has no vision in that right eye.  States has been going on for a little while.  Watery drainage.  The history is provided by the patient.  Hypertension Pertinent negatives include no chest pain, no abdominal pain, no headaches and no shortness of breath.  Eye Problem Associated symptoms: no discharge, no headaches and no vomiting   Illness Severity:  Moderate Onset quality:  Gradual Duration:  2 days Timing:  Constant Progression:  Worsening Chronicity:  New Associated symptoms: no abdominal pain, no chest pain, no congestion, no diarrhea, no fever, no headaches, no myalgias, no rash, no shortness of breath and no vomiting        Past Medical History:  Diagnosis Date  . Diabetes mellitus without complication ([place])   . Stroke [facility])     Patient Active Problem List   Diagnosis Date Noted  . 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 AKI (acute kidney injury) ([place]) 09/17/2019  . Nephrotic syndrome 09/17/2019  . Type 2 diabetes mellitus with stage 3b chronic kidney disease, with long-term current use of insulin ([place]) 09/17/2019  . Acute CVA (cerebrovascular accident) ([place]) 08/11/2019  . DM (diabetes mellitus) ([place]) 08/11/2019  . Benign hypertension with chronic kidney disease, stage III 08/11/2019  . Tobacco abuse 08/11/2019    Past Surgical History:  Procedure Laterality Date  . BUBBLE STUDY  08/13/2019   Procedure: BUBBLE STUDY;  Surgeon: [name], MD;  Location: [facility];  Service: Cardiovascular;;  . LOOP RECORDER INSERTION N/A 08/13/2019   Procedure: LOOP  RECORDER INSERTION;  Surgeon: [name], MD;  Location: [facility] [facility] CV LAB;  Service: Cardiovascular;  Laterality: N/A;  . TEE WITHOUT CARDIOVERSION N/A 08/13/2019   Procedure: TRANSESOPHAGEAL ECHOCARDIOGRAM (TEE);  Surgeon: [name], MD;  Location: [facility] ENDOSCOPY;  Service: Cardiovascular;  Laterality: N/A;  PATIENT NEEDS LOOP       Family History  Problem Relation Age of Onset  . Other Neg Hx        patient denies any particular family medical history    Social History   Tobacco Use  . Smoking status: Current Every Day Smoker  . Smokeless tobacco: Never Used  Substance Use Topics  . Alcohol use: No  . Drug use: Not on file    Home Medications Prior to Admission medications   Medication Sig Start Date End Date Taking? Authorizing Provider  amLODipine (NORVASC) 10 MG tablet Take 1 tablet (10 mg total) by mouth daily. 08/15/19   [name], DO  aspirin EC 81 MG EC tablet Take 1 tablet (81 mg total) by mouth daily. 10/03/19   Harvie Heck, MD  atorvastatin (LIPITOR) 80 MG tablet Take 1 tablet (80 mg total) by mouth daily at 6 PM. 08/14/19   [name], DO  blood glucose meter kit and supplies KIT Dispense based on patient and insurance preference. Use up to four times daily as directed. (FOR ICD-9 250.00, 250.01). 08/14/19   [name], DO  clindamycin (CLEOCIN) 150 MG capsule Take 3 capsules (450  mg total) by mouth 3 (three) times daily for 10 days. 10/14/19 10/24/19  Deno Etienne, DO  clopidogrel (PLAVIX) 75 MG tablet Take 75 mg by mouth daily.    [provider]  erythromycin ophthalmic ointment Place a 1/2 inch ribbon of ointment into the lower eyelid four times a day 10/14/19   Deno Etienne, DO  insulin aspart (NOVOLOG) 100 UNIT/ML FlexPen Inject 4 Units into the skin 3 (three) times daily with meals. 08/14/19   [name], DO  Insulin Glargine (LANTUS) 100 UNIT/ML Solostar Pen Inject 12 Units into the skin daily. Patient taking differently: Inject  12 Units into the skin at bedtime.  08/14/19   [name], DO  losartan (COZAAR) 50 MG tablet Take 50 mg by mouth daily. 09/17/19   [provider]  nicotine (NICODERM CQ - DOSED IN MG/24 HOURS) 21 mg/24hr patch Place 1 patch (21 mg total) onto the skin daily. 08/15/19   [name], DO    Allergies    Patient has no known allergies.  Review of Systems   Review of Systems  Constitutional: Negative for chills and fever.  HENT: Negative for congestion and facial swelling.   Eyes: Positive for pain. Negative for discharge and visual disturbance.  Respiratory: Negative for shortness of breath.   Cardiovascular: Negative for chest pain and palpitations.  Gastrointestinal: Negative for abdominal pain, diarrhea and vomiting.  Musculoskeletal: Negative for arthralgias and myalgias.  Skin: Negative for color change and rash.  Neurological: Negative for tremors, syncope and headaches.  Psychiatric/Behavioral: Negative for confusion and dysphoric mood.    Physical Exam Updated Vital Signs BP (!) 198/87   Pulse 90   Temp 98.4 F (36.9 C) (Oral)   Resp 17   SpO2 98%   Physical Exam Vitals and nursing note reviewed.  Constitutional:      Appearance: He is well-developed.  HENT:     Head: Normocephalic and atraumatic.     Comments: Right upper lid edema.  No specific erythema or warmth.  No pain with intraocular movement. Eyes:     Pupils: Pupils are equal, round, and reactive to light.  Neck:     Vascular: No JVD.  Cardiovascular:     Rate and Rhythm: Normal rate and regular rhythm.     Heart sounds: No murmur. No friction rub. No gallop.   Pulmonary:     Effort: No respiratory distress.     Breath sounds: No wheezing.  Abdominal:     General: There is no distension.     Tenderness: There is no guarding or rebound.  Musculoskeletal:        General: Normal range of motion.     Cervical back: Normal range of motion and neck supple.  Skin:    Coloration: Skin is  not pale.     Findings: No rash.  Neurological:     Mental Status: He is alert and oriented to person, place, and time.  Psychiatric:        Behavior: Behavior normal.     ED Results / Procedures / Treatments   Labs (all labs ordered are listed, but only abnormal results are displayed) Labs Reviewed - No data to display  EKG None  Radiology CT MAXILLOFACIAL WO CONTRAST  Result Date: 10/14/2019 CLINICAL DATA:  Cellulitis, RIGHT eye swelling EXAM: CT MAXILLOFACIAL WITHOUT CONTRAST TECHNIQUE: Multidetector CT imaging of the maxillofacial structures was performed. Multiplanar CT image reconstructions were also generated. COMPARISON:  Brain MRI 09/30/2019 FINDINGS: Osseous:  Orbital rims are intact. No evidence of bone infection or trauma. Maxillary sinuse walls are normal. Pterygoid plates are normal. Prior ethmoid surgery on the LEFT. No fluid in the paranasal sinuses Orbits: Mild preseptal thickening on the RIGHT just lateral to the orbit. The globes are normal. Potential mild proptosis on the RIGHT. The intraconal contents are clear. Extraocular muscles are normal. No postseptal process. Sinuses: Sinuses are clear.  No fluid levels Soft tissues: Mild preseptal skin thickening on the RIGHT. Limited intracranial: Unremarkable IMPRESSION: 1. Mild preseptal skin thickening on the RIGHT suggests cellulitis. 2. Potential mild proptosis on the RIGHT. 3. No postseptal process.  Intraconal contents are clear. 4. No evidence of sinus infection. Electronically Signed   By: Suzy Bouchard M.D.   On: 10/14/2019 15:08    Procedures Procedures (including critical care time)  Medications Ordered in ED Medications  amLODipine (NORVASC) tablet 10 mg (10 mg Oral Given 10/14/19 1324)  losartan (COZAAR) tablet 50 mg (50 mg Oral Given 10/14/19 1324)  clindamycin (CLEOCIN) capsule 450 mg (has no administration in time range)  ketorolac (TORADOL) injection 15 mg (15 mg Intramuscular Given 10/14/19 1324)    fluorescein ophthalmic strip 1 strip (1 strip Right Eye Given 10/14/19 1324)    ED Course  I have reviewed the triage vital signs and the nursing notes.  Pertinent labs & imaging results that were available during my care of the patient were reviewed by me and considered in my medical decision making (see chart for details).    MDM Rules/Calculators/A&P                      48 yo M with a chief complaints of right upper eyelid swelling.  Going on for about 48 hours.  Most likely infectious etiology though no specific erythema.  There is some limited history from the patient.  He told me he has been having issues with that eye for some time though looking back at his chart he was recently diagnosed with a central retinal artery occlusion 2 weeks ago.  Will obtain a CT scan of the face with contrast to evaluate for post septal cellulitis.  CT scan with no obvious preseptal cellulitis.  Will start on antibiotics.  Have the patient follow-up with his ophthalmologist in the office.  3:20 PM:  I have discussed the diagnosis/risks/treatment options with the patient and believe the pt to be eligible for discharge home to follow-up with Ophtho. We also discussed returning to the ED immediately if new or worsening sx occur. We discussed the sx which are most concerning (e.g., sudden worsening pain, fever, inability to tolerate by mouth) that necessitate immediate return. Medications administered to the patient during their visit and any new prescriptions provided to the patient are listed below.  Medications given during this visit Medications  amLODipine (NORVASC) tablet 10 mg (10 mg Oral Given 10/14/19 1324)  losartan (COZAAR) tablet 50 mg (50 mg Oral Given 10/14/19 1324)  clindamycin (CLEOCIN) capsule 450 mg (has no administration in time range)  ketorolac (TORADOL) injection 15 mg (15 mg Intramuscular Given 10/14/19 1324)  fluorescein ophthalmic strip 1 strip (1 strip Right Eye Given 10/14/19  1324)     The patient appears reasonably screen and/or stabilized for discharge and I doubt any other medical condition or other [facility] requiring further screening, evaluation, or treatment in the ED at this time prior to discharge.   Final Clinical Impression(s) / ED Diagnoses Final diagnoses:  Preseptal cellulitis of right  upper eyelid    Rx / DC Orders ED Discharge Orders         Ordered    clindamycin (CLEOCIN) 150 MG capsule  3 times daily     10/14/19 1519    erythromycin ophthalmic ointment     10/14/19 [place], Alondria Mousseau, DO 10/14/19 1520

## 2019-10-14 NOTE — Progress Notes (Signed)
ILR remote 

## 2019-10-14 NOTE — ED Notes (Signed)
Son notified patient is being discharged. Informed results of scan and teaching regarding taking medication for BP on a regular basis.

## 2019-10-15 ENCOUNTER — Ambulatory Visit: Payer: BLUE CROSS/BLUE SHIELD | Admitting: Family Medicine

## 2019-10-20 ENCOUNTER — Ambulatory Visit (INDEPENDENT_AMBULATORY_CARE_PROVIDER_SITE_OTHER): Payer: BLUE CROSS/BLUE SHIELD | Admitting: *Deleted

## 2019-10-20 DIAGNOSIS — I639 Cerebral infarction, unspecified: Secondary | ICD-10-CM

## 2019-10-20 LAB — CUP PACEART REMOTE DEVICE CHECK
Date Time Interrogation Session: 20201228094047
Implantable Pulse Generator Implant Date: 20201021

## 2019-10-21 ENCOUNTER — Other Ambulatory Visit: Payer: Self-pay | Admitting: *Deleted

## 2019-10-21 NOTE — Patient Outreach (Signed)
[facility]) Care Management  10/21/2019  Phillip Camacho 1971/05/26 [id number]   THN Transition of care follow up-ED visit 10/14/19   Insurance: blue cross and blue shield  [facility] admissions x 2 ED visits x 3 in the last 6 months  09/29/19- 10/02/19 central retinal artery occlusion, acute CVA (R frontal, L temporal and R occipital lobe subacute strokes 2/2 small vessel calcinosis from atherosclerosis), DM, HTN 10/14/19 ED visit for cellulitis of eye, HTN  EMMI stroke discontinued  With a brief review of chart notes since last contact made Pt went to ED, establishment appointment with Dr Chapman Fitch was cancelled for 10/15/19 but not rescheduled. Pt scheduled to be seen by cardiology, Uncertain if pt seen [id number] ED d/c instructions in vietnamese  Outreach attempt # 1 to his home number No answer Automated voice message reports pt not receiving calls [place] to his daughter [name] No answer. THN RN CM left HIPAA compliant voicemail message along with CM's contact info.   Email to [facility] RN CM inquiring about contact with patient and cancelled 10/15/19 MD appointment   Plan: [facility] RN CM sent an unsuccessful outreach letter and scheduled this patient for another call attempt within 4 business days   [place] L. Lavina Hamman, RN, BSN, [place] Coordinator Office number [phone] Mobile number [phone]  Main THN number [phone] Fax number [phone]

## 2019-10-21 NOTE — Patient Outreach (Signed)
[facility] Camacho) Care Management  10/21/2019  Phillip Camacho 16-Jan-1971 [id number]   Care coordination for establishment of primary care provider care and a Return call from Patient daughter   Phillip Camacho, daughter,  returned a call to Phillip Camacho verified She clarifies Phillip new permanent address for Phillip Camacho  [facility] entered new address in Epic along with daughter in law information  Phill[facility] RN Camacho discussed Phillip call purpose is to follow up after Phillip ED visit and to assist with getting Phillip patient a MD f/up visit as it was noted Phillip 10/15/19 appointment was cancelled. Updated her Phillip [facility] [place] RN Camacho had made contact with Phillip Phillip Camacho (Phillip Camacho) RN Camacho   Transition of care (ED visit) assessment- Week 1   Phillip Aborn is now staying with his son, Phillip Camacho and his family His daughter in Sports coach (CNA) is in Phillip home with Phillip patient mostly  This week Phillip Camacho is being visited by his younger son per Phillip Camacho "works a lot" per Phillip Camacho but is off on Wednesdays to assist Phillip Camacho with medical appointment transportation  All appointments need to be scheduled with Phillip Camacho reports she has been informed with calls to her brother's home that Phillip Camacho is not "doing well" she reports he is not eating well (has a poor appetite), has a swollen spot on his right eye, not being able to see well, is weak, is not getting up but has been given Phillip prescribed Camacho as ordered. Phillip Camacho reports that she recently called to [place] to assist with HTN Camacho and one on Phillip discharge sheet but she reports he will soon need insulin [facility] RN Camacho discussed Phillip importance of follow up medical care and now especially if Phillip Ly is having symptoms Phillip Camacho reports she has called Phillip Phillip Camacho about Phillip Phillip Camacho application as he is still not working. She was informed that she may find out more about medicaid approval on 11/16/2019. She was informed that she was  informed that Phillip murin "would need a referral to Phillip state to go to a certain doctor" [facility] RN Camacho discuss that [facility] RN Camacho is not aware of this but Phillip Camacho is a facility of Camacho based on income and has Camacho in Phillip Camacho for pharmacy, CMs, SWs, etc. She voiced appreciation and understanding   Phillip Camacho called Phillip Camacho to review updated information from Phillip Camacho and assisted with information for scheduling Phillip initial Phillip Camacho Camacho follow up appointment. Phillip Camacho noted earliest appointment with Phillip Camacho is on 11/12/19 Phillip Camacho discussed a Phillip Camacho (facility associated with Phillip Camacho (Phillip Camacho) appointment with Phillip Camacho on Wednesday October 22 2019 at 1350 to get Phillip Camacho evaluated as he has worsening symptoms. He will be able to return to Phillip Camacho to see Phillip Camacho prn   [facility] RN Camacho called to discuss this with son, Phillip Camacho while Phillip Camacho, [facility] RN Camacho was on Phillip line. Phillip Camacho to speak with his supervisor about getting this time to take Phillip Camacho to the MD appointment. Phillip Camacho reports a change in his available days off He is now working all weekdays and will need permission to get off to take patient to MD. He reports he can be reach most days after 1630 best at Phillip listed number in [place]. Updated Phillip Camacho and concluded Phillip call with Phillip Camacho  [facility] RN Camacho received a return call from Phillip Camacho to confirm that his  supervisor will allow him to get off work to take Phillip Camacho to Phillip medical appointment Phillip Camacho to return a call to [facility] later today to get Phillip facility address   Plan [facility] RN Camacho will follow up with Phillip Camacho and/or family members within Phillip next 7-14 business days- to follow up on establishment of primary care provider appointment  Phillip Camacho encouraged to return a call to Phillip [facility] RN Camacho prn  Phillip [facility] RN Camacho sent a successful outreach letter as discussed with Phillip Camacho brochure enclosed for review to Phillip new address for Phillip Saadeh   Routed note to MDs/NP/PA   [facility] Care Plan Problem One     Most Recent Value  Care Plan  Problem One  Risk for re admission related to support system and language issues  Role Documenting Phillip Problem One  Care Management Telephonic Coordinator  Care Plan for Problem One  Active  Phillip Long Term Goal   over Phillip next 31 days,patient willOver Phillip next 31 days, patient will not experience Camacho readmission, as evidence by patient reporting and review of EMR during [place] outreach  [place] Term Goal Start Date  10/21/19  Interventions for Problem One Long Term Goal  call to Phillip Camacho, daughter, left message for daughter, spoke with and assessment with daughter, updated EPIC demographics, collaboration with [facility] RN Camacho, Phillip Camacho to get Camacho follow up appointment for 10/22/19 spoke with son  [facility] Camacho Short Term Goal #1   over Phillip next 14 days patient will have a hosptial follow up with a new cost efficient primary care provider as evidence by EMR or verbalization from patient/family of a completed visit during Phillip next contact  [facility] Camacho Short Term Goal #1 Start Date  10/21/19  Interventions for Short Term Goal #1  Reviewed EPIC notes, call to Phillip Camacho, daughter, left message for daughter, spoke with and assessment with daughter, updated EPIC demographics, collaboration with [facility] RN Camacho, Phillip Camacho to get Camacho follow up appointment for 10/22/19 spoke with son discussed importance of f/u appointment        [name], RN, BSN, [name] Coordinator Office number [phone] Mobile number [phone]  Main Phillip number [phone] Fax number [phone]

## 2019-10-22 ENCOUNTER — Ambulatory Visit (INDEPENDENT_AMBULATORY_CARE_PROVIDER_SITE_OTHER): Payer: BLUE CROSS/BLUE SHIELD | Admitting: Primary Care

## 2019-10-23 ENCOUNTER — Other Ambulatory Visit: Payer: Self-pay

## 2019-10-23 ENCOUNTER — Other Ambulatory Visit: Payer: Self-pay | Admitting: *Deleted

## 2019-10-23 ENCOUNTER — Telehealth: Payer: Self-pay

## 2019-10-23 NOTE — Patient Outreach (Signed)
[facility]) Care Management  10/23/2019  KENICHI TREU Mar 01, 1971 [id number]   Collaboration with [place] RN CM   Opal Sidles called [facility] RN CM Daughter in law called to make Mr Harless Nakayama an appointment for 11/12/19 1440 with Dr Charline Bills and [facility] Eligibility staff discussed pt  [place]  RN CM and [facility] RN CM identified barriers for patients cost of medical care, co pay and present insurance coverage   Plan: [facility] RN CM scheduled this patient for another call attempt within 4-7 business days   Pt encouraged to return a call to [place] CM prn   Deigo Alonso L. Lavina Hamman, RN, BSN, [place] Coordinator Office number [phone] Mobile number [phone]  Main THN number [phone] Fax number [phone]

## 2019-10-23 NOTE — Telephone Encounter (Signed)
Call received from patient's daughter in law, [place], requesting an appointment for patient.  She said that she had spoken to Joellyn Quails, RN/THN and understands that her father in law has missed/cancelled a number of appointments.  She said that he is worried about paying for the office visit  Explained to her that her father in law has insurance and they will be billed for the visit. The patient can still be seen here. This CM is not sure of the insurance coverage for the visit.   An appointment was scheduled for 11/12/2019 @ 1440. Provided her with the address for the clinic as well as the main clinic phone number.  She confirmed that he has transportation to the appt.   As per Wynonia Hazard, [facility] Financial Counselor, he just spoke to the daughter in law regarding the cost for the office visit. He explained to her that they would be charged for the visit and he has large out of pocket expense  and she should contact his insurance company for specific coverage information.   This information noted was shared with  Joellyn Quails, RN/THN. Also provided her with the phone number for [employer] [place] to contact regarding other possible options for insurance coverage.

## 2019-10-23 NOTE — Patient Outreach (Signed)
[facility]) Care Management  10/23/2019  DEVONTRE CRIBARI Sep 24, 1971 [id number]   Care coordination - Calls from family   [place] received calls without messages from [place] and [place] RN CM returned a call to [facility] no answer [facility] RN CM returned a call to [place] and was leaving a voice message when she called [facility] RN CM. Reviewed with her that [place] had received a call from Opal Sidles at [facility] about the scheduled appointment, eligibility and Mr Sekel coverage She agreed for [facility] RN CM to return a call for follow up to [facility] and then her prn    Plan: [facility] RN CM scheduled this patient for another call attempt within 4-7 business days   Pt encouraged to return a call to [facility] RN CM prn   [name], RN, BSN, [place] Coordinator Office number [phone] Mobile number [phone]  Main THN number [phone] Fax number [phone]

## 2019-10-23 NOTE — Patient Outreach (Signed)
[facility]) Care Management  10/23/2019  Phillip Camacho [birth year] [id number]   Follow up contact to [facility]  and patient family  Transition of care services after 10/14/19 ED visit (week 2,not on APL)  THN RN CM collaborated with Phillip Camacho, [facility] ([place] and wellness center) RN CM, after noting pt was a no show for 10/22/19 appointment. Previous appointments had been also cancelled by his son/family   Outreach attempt # 1 to his son Phillip Camacho [phone] No answer. THN RN CM left HIPAA compliant voicemail message along with [facility] RN CM and CM's contact info. Left him with the contact number for [facility] RN CM to assist with further appointments   Outreach attempt #2 to his daughter in law Phillip Camacho discussed the contact with Phillip Camacho's daughter Phillip Camacho on October 21 2019 and her encouragement to reach out to Phillip Camacho prn  Phillip voiced understanding. [facility] RN CM reviewed the contact with Phillip Camacho on 10/21/19 and the missed MD appointment on 10/22/19. THN RN CM discussed the importance for hospital and ED follow up medical appointment for Phillip Camacho. THN RN CM discussed [place] and provided her with [facility] RN CM number for them to call to schedule an appointment for follow up. THN RN CM discussed THN RN CM's  availability. She mentioned cost. [facility] RN CM discussed with her that [facility] and [facility] are low income based facilities. She voiced understanding and voiced apologies for communication concerns. She also states she would be able to assist with transporting Phillip Camacho to medical appointments if scheduled.  THN RN CM has previously spoken in detail to his daughter Phillip Camacho about [facility] being a low income based clinic with MDs, RNs, RN CMs, SWs and pharmacy She shared that Phillip Camacho was doing better but fair when [facility] RN CM inquired  She shared that Phillip Camacho was still having trouble seeing out of his right eye, was weak, did not want to eat and needed lots of  encouragement to eat. [facility] RN CM discussed that these symptoms are symptoms that needed to be follow up by a primary care provider.   Plan: [facility] RN CM scheduled this patient for another call attempt within 4-7 business days   Pt encouraged to return a call to [facility] RN CM prn   Routed note to MDs/NP/PA  [facility] CM Care Plan Problem One     Most Recent Value  Care Plan Problem One  Risk for re admission related to support system and language issues  Role Documenting the Problem One  Care Management Telephonic Coordinator  Care Plan for Problem One  Active  THN Long Term Goal   Over the next 31 days, patient will not experience hospital readmission, as evidence by patient reporting and review of EMR during [place] outreach  [place] Term Goal Start Date  10/21/19  Interventions for Problem One Long Term Goal  collaborated withn [facility], Left messge for son and spoke with daughter in law see notes   THN CM Short Term Goal #1   over the next 14 days patient will have a hosptial follow up with a new cost efficient primary care provider as evidence by EMR or verbalization from patient/family of a completed visit during the next contact  [facility] CM Short Term Goal #1 Start Date  10/21/19  Interventions for Short Term Goal #1  collaborated withn [facility], Left messge for son and spoke with daughter in law see  notes provided family wth the information and numbers to scheduled the needed appointment on 10/23/19       Veron Senner L. Lavina Hamman, RN, BSN, [place] Coordinator Office number [phone] Mobile number [phone]  Main [facility] number [phone] Fax number [phone]'

## 2019-10-30 ENCOUNTER — Other Ambulatory Visit: Payer: Self-pay

## 2019-10-30 ENCOUNTER — Other Ambulatory Visit: Payer: Self-pay | Admitting: *Deleted

## 2019-10-30 NOTE — Patient Outreach (Signed)
[facility]) Care Management  10/30/2019  Phillip Camacho [birth year] [id number]   Follow up for Transition of care services after 10/14/19 ED visit (week 3,not on APL)  [facility] RN CM reviewed Phillip Camacho scanned Insurance card in [employer] cross and blue shield blue e blue local with [place] for online in network providers Noted [place] [phone], [facility] [place] [phone] as in network options  Pt  In network co pays are primary $45, Specialist [phone], Urgent care.ER $115 Out of network full amount until after Deductible met   Outreach  to his Daughter Phillip Camacho successfully  Phillip Camacho is able to verify HIPAA Discussed the preference of Phillip Camacho son and daughter in law for [facility] RN CM to speak with Phillip Camacho states she has not been updated by her brother of Phillip Camacho's progression in a while and states she feels it may be better for the patient to live alone when he is more stable  Phillip Camacho's medicaid is still pending and Phillip Camacho reports she will contact the [employer] of social services (DSS) on 11/16/19 to get and updated of possible approval as she was instructed.  THN RN CM reviewed the information [facility] RN CM gathered with review of the scanned insurance card in Epic. THN RN CM discussed the coverage and co pay cost barriers with Phillip Camacho. Phillip Camacho present insurance coverage requires out of pocket costs that he nor the family has at this time especially with Phillip Camacho is not working.   THN RN CM reviewed in network and out of network MD visit cost ($45 for in network and $45+ for out of network). THN RN CM discussed and provided the found online in network [employer] Phillip Camacho reports she placed the above mentioned 3 in network  providers' addresses and numbers in her phone. Phillip Camacho voices hope that Phillip Certain will be offered [place] medicaid. THN RN CM provided education on [place] medicaid plans and suggest she verify which [facility] medicaid plan Phillip Oyster may be offered  [facility] RN reviewed the scheduled 11/12/19 [place] ([place] and wellness center) appointment with Dr Verne Grain at 1440. She wrote down the address for [facility]. THN RN CM discussed the previous missed appointment at the sister facility, [facility] and the cancelled appointments at [facility]. THN RN CM discussed that Phillip Banwart will be requested for a co pay at [facility] for the visit s he is listed to continue to have Blue cross and blue shield coverage. Discussed that he may possibly be able to be billed Discussed [facility] RN CM's collaboration with [facility] RN CM plus the [facility] RN CM's collaboration with [employer] eligibility staff. Phillip Camacho voiced understanding   THN RN CM discussed with Phillip Camacho that [facility] RN CM will soon complete the 30 day or less [facility] for this EMMI pt not on the APL list per [facility] workflow. She voiced understanding   Plan [facility] RN CM scheduled this patient for another call attempt within 7-10business days to assess complete [facility] EMMI stroke non APL services and plan for case closure as discussed   Pt encouraged to return a call to [facility] RN CM prn Routed note to MD/PA.NP  Mickel Crow. Lavina Hamman, RN, BSN, [place] Coordinator Office number [phone] Mobile  number [phone]  Main THN number [phone] Fax number [phone]

## 2019-11-07 ENCOUNTER — Other Ambulatory Visit: Payer: Self-pay | Admitting: *Deleted

## 2019-11-07 NOTE — Patient Outreach (Signed)
[facility]) Care Management  11/07/2019  Phillip Camacho [birth year] [id number]   Follow up attempt for Transition of care services after 10/14/19 ED visit (week 4 ,not on APL)  Outreach attempt unsuccessful to daughter, [name] No answer. THN RN CM left HIPAA compliant voicemail message along with CM's contact info.   Plan: Pending return call from daughter/patient,THN RN CM will continue and plan for case closure for this patient within 5 business days as he is not on the APL list (as discussed with daughter on 10/30/19).  THN RN CM has provided services since 10/14/19 Pt has been connected again with [facility] ([place] and wellness center) and has a scheduled appointment on 11/12/19. Daughter is also awaiting response from medicaid coverage  Routed note to pending MD, Dr Levan Hurst L. Lavina Hamman, RN, BSN, [place] Coordinator Office number [phone] Mobile number [phone]  Main THN number [phone] Fax number [phone]

## 2019-11-07 NOTE — Patient Outreach (Addendum)
[facility]) Care Management  11/07/2019  TERREANCE VALCOURT 05-29-1971 [id number]   Follow upforTransition of care services after 10/14/19 ED visit (week 4,not on APL)   Patient's daughter returned a call to [facility] RN CM She is able to verify HIPAA, DOB and Address  Consent: [facility] RN CM reviewed [facility] services with patient. Patient gave verbal consent for [facility] telephonic RN CM services.   Update on patient status   [name] states as of 10/30/19 per her sister in law, Mr Gibney is reported to continue with a poor appetite, decreased activity and some visual concerns.  THN RN CM again encouraged urgent care services or EM S services prn plus keeping and attending the 11/12/19 appointment at [facility] wellness center) [name] voiced understanding    Medicaid, coverage, future services  [name] spoke with [facility] staff who updated her that Mr Whidby was denied medicaid disability and other medicaid services. They shared information about the orange card connected with "[place]" and [place] health Department's MAPP program for prescription assistance with [name]  [facility] RN CM provided education about the [place] uninsured orange card and MAPP program  [facility] RN CM also discussed if Mr Cartee was uninsured and visited the [facility] health and wellness center) he could see the [facility] eligibility specialist for assistance plus use the [facility] pharmacy services.  THN RN CM encouraged his daughter to speak with Mr Ceballo and family about the options he has for further medical and prescription care.  THN RN CM encouraged his daughter to check on the status of the present insurance coverage and to contact the [place] program for prescription assistance if the plans are to continue the present coverage [name] reports she does have the present coverage customer service number and will verify the status of the insurance  plan  [name] voiced understanding and appreciation of resources and services rendered  Plan: Pending any further return call from daughter/patient,THN RN CM will continue and plan for case closure for this patient within 5 business days as he is not on the APL list (as discussed with daughter on 10/30/19& 11/07/19).  THN RN CM has provided services since 10/14/19 Pt has been connected again with [facility] ([place] and wellness center) and has a scheduled appointment on 11/12/19. Daughter is also awaiting response from medicaid coverage Routed note to MD and [place] CM Opal Sidles   [facility] CM Care Plan Problem One     Most Recent Value  Care Plan Problem One  Risk for re admission related to support system and language issues  Role Documenting the Problem One  Care Management Telephonic Coordinator  Care Plan for Problem One  Active  THN Long Term Goal   Over the next 31 days, patient will not experience hospital readmission, as evidence by patient reporting and review of EMR during [place] outreach  [place] Term Goal Start Date  10/21/19  Interventions for Problem One Long Term Goal  assessed halth status and encouraged urgent care or EMS services prn plus keeping the 11/12/19 [facility] appointment with Dr Chapman Fitch  [facility] CM Short Term Goal #1   over the next 14 days patient will have a hosptial follow up with a new cost efficient primary care provider as evidence by EMR or verbalization from patient/family of a completed visit during the next contact  [facility] CM Short Term Goal #1 Start Date  10/30/19  Interventions for Short Term Goal #  1  assessed halth status and encouraged urgent care or EMS services prn plus keeping the 11/12/19 [facility] appointment with Dr Levan Hurst L. Lavina Hamman, RN, BSN, [place] Coordinator Office number [phone] Mobile number [phone]  Main THN number [phone] Fax number [phone]

## 2019-11-12 ENCOUNTER — Ambulatory Visit (HOSPITAL_BASED_OUTPATIENT_CLINIC_OR_DEPARTMENT_OTHER): Payer: 59 | Admitting: Family Medicine

## 2019-11-12 ENCOUNTER — Encounter: Payer: Self-pay | Admitting: Family Medicine

## 2019-11-12 VITALS — BP 196/94 | HR 96 | Temp 98.5°F | Resp 16 | Ht 65.0 in | Wt 138.0 lb

## 2019-11-12 DIAGNOSIS — E1169 Type 2 diabetes mellitus with other specified complication: Secondary | ICD-10-CM

## 2019-11-12 DIAGNOSIS — I10 Essential (primary) hypertension: Secondary | ICD-10-CM

## 2019-11-12 DIAGNOSIS — Z794 Long term (current) use of insulin: Secondary | ICD-10-CM | POA: Diagnosis not present

## 2019-11-12 DIAGNOSIS — Z8673 Personal history of transient ischemic attack (TIA), and cerebral infarction without residual deficits: Secondary | ICD-10-CM

## 2019-11-12 DIAGNOSIS — E785 Hyperlipidemia, unspecified: Secondary | ICD-10-CM

## 2019-11-12 DIAGNOSIS — H00031 Abscess of right upper eyelid: Secondary | ICD-10-CM

## 2019-11-12 DIAGNOSIS — Z789 Other specified health status: Secondary | ICD-10-CM

## 2019-11-12 DIAGNOSIS — E1121 Type 2 diabetes mellitus with diabetic nephropathy: Secondary | ICD-10-CM | POA: Diagnosis not present

## 2019-11-12 DIAGNOSIS — Z79899 Other long term (current) drug therapy: Secondary | ICD-10-CM

## 2019-11-12 DIAGNOSIS — N1832 Chronic kidney disease, stage 3b: Secondary | ICD-10-CM

## 2019-11-12 LAB — GLUCOSE, POCT (MANUAL RESULT ENTRY): POC Glucose: 234 mg/dL — AB (ref 70–99)

## 2019-11-12 MED ORDER — AMLODIPINE BESYLATE 10 MG PO TABS: 10.0000 mg | ORAL_TABLET | Freq: Every day | ORAL | 0 refills | Status: DC

## 2019-11-12 MED ORDER — ERYTHROMYCIN 5 MG/GM OP OINT: TOPICAL_OINTMENT | OPHTHALMIC | 0 refills | Status: DC

## 2019-11-12 MED ORDER — ASPIRIN 81 MG PO TBEC: 81.0000 mg | DELAYED_RELEASE_TABLET | Freq: Every day | ORAL | 0 refills | Status: DC

## 2019-11-12 MED ORDER — LOSARTAN POTASSIUM 50 MG PO TABS: ORAL_TABLET | ORAL | 3 refills | Status: DC

## 2019-11-12 MED ORDER — LORATADINE 10 MG PO TABS: 10.0000 mg | ORAL_TABLET | Freq: Every day | ORAL | 11 refills | Status: DC

## 2019-11-12 MED ORDER — ATORVASTATIN CALCIUM 80 MG PO TABS: 80.0000 mg | ORAL_TABLET | Freq: Every day | ORAL | 0 refills | Status: DC

## 2019-11-12 MED ORDER — INSULIN ASPART 100 UNIT/ML FLEXPEN: 4.0000 [IU] | PEN_INJECTOR | Freq: Three times a day (TID) | SUBCUTANEOUS | 0 refills | Status: DC

## 2019-11-12 MED ORDER — CEPHALEXIN 500 MG PO CAPS: 500.0000 mg | ORAL_CAPSULE | Freq: Three times a day (TID) | ORAL | 0 refills | Status: DC

## 2019-11-12 MED ORDER — CLOPIDOGREL BISULFATE 75 MG PO TABS: 75.0000 mg | ORAL_TABLET | Freq: Every day | ORAL | 3 refills | Status: DC

## 2019-11-12 MED ORDER — INSULIN GLARGINE 100 UNIT/ML SOLOSTAR PEN: 12.0000 [IU] | PEN_INJECTOR | Freq: Every day | SUBCUTANEOUS | 0 refills | Status: DC

## 2019-11-12 NOTE — Progress Notes (Signed)
Subjective:  Patient ID: Phillip Camacho, male    DOB: 12-30-1970  Age: 49 y.o. MRN: [id number]  CC: New Patient (Initial Visit)  Due to a language barrier patient was accompanied by an inpatient interpreter at today's visit.   HPI Phillip Camacho, 49 year old [place] male, who presents to establish care status post hospitalization 08/11/2019 through 08/14/2019 for acute CVA, diabetes, uncontrolled hypertension and tobacco abuse after presenting to the ED with confusion/altered mental status and left-sided facial droop and left-sided weakness and blood pressure of 228/95 with creatinine of 2.72 and head CT showed recent infarct in the right cerebellum.  MRI with small right cerebellar and bilateral cerebral punctate infarcts thought to be embolic.  Patient also had evidence of age advanced atherosclerosis, right ICA siphon 50% stenosis, right P2 moderate and P3 high-grade stenosis on MRI of the head.  Transesophageal echo showed proximal aortic arch ulcerated plaque which potentially could be the source of patient's embolic stroke. Hgb A1c was elevated at  Patient did follow-up with cardiology status post hospitalization due to cryptogenic stroke and patient was also seen by nephrology due to elevated creatinine.  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 He is most recently status post ED visit on 10/14/2019 for preseptal cellulitis of the right eyelid.          At today's visit, he reports that he continues to have sensation of swelling and itching of the right upper eyelid. He also has sneezing and clear runny nose at times. He reports loss of vision in the right eye due to most recent CVA. He slept late and did not take any of his medications today. He does have some issues with headaches,  usually on the right side of the head.          He has not followed up with Nephrology recently because he cannot afford the co-payment for the visit.  He is accompanied by his adult son who helps with administration of medications including insulin.  Son also helps with monitoring of blood sugars.  Patient's fasting blood sugars are running in the one thirties to one forties.  Patient denies any current issues with increased thirst or frequent urination.  He is taking his atorvastatin and does not feel that he is having any increased muscle aches related to the atorvastatin.  He does feel that he has some fatigue related to his recent CVA.  He believes that his most concerning issue status post CVA is his vision loss in the right eye.  He has some occasional mild headaches.  He has also had a few days of increased nasal congestion and clear runny nose.  He denies any recent fever or chills.  No shortness of breath or cough and he has had no chest pain or palpitations.  He denies any recent episodes of vision change or focal numbness or weakness.  He denies any issues with unusual bruising or bleeding related to his use of blood thinning medication.  Past Medical History:  Diagnosis Date  . Diabetes mellitus without complication ([place])   . Stroke [facility])     Past Surgical History:  Procedure Laterality Date  . BUBBLE STUDY  08/13/2019   Procedure: BUBBLE STUDY;  Surgeon: [name], MD;  Location: [place] [facility];  Service: Cardiovascular;;  . LOOP RECORDER  INSERTION N/A 08/13/2019   Procedure: LOOP RECORDER INSERTION;  Surgeon: [name], MD;  Location: [place] [facility];  Service: Cardiovascular;  Laterality: N/A;  . TEE WITHOUT CARDIOVERSION N/A 08/13/2019   Procedure: TRANSESOPHAGEAL ECHOCARDIOGRAM (TEE);  Surgeon: [name], MD;  Location: [facility] ENDOSCOPY;  Service: Cardiovascular;  Laterality: N/A;  PATIENT NEEDS LOOP    Family History  Problem Relation Age of Onset  . Other Neg  Hx        patient denies any particular family medical history    Social History   Tobacco Use  . Smoking status: Current Every Day Smoker  . Smokeless tobacco: Never Used  Substance Use Topics  . Alcohol use: No    ROS Review of Systems  Constitutional: Positive for fatigue. Negative for chills and fever.  HENT: Positive for congestion, postnasal drip and sneezing. Negative for sore throat and trouble swallowing.   Eyes: Positive for visual disturbance. Negative for photophobia.       Eyelid itching  Respiratory: Negative for cough and shortness of breath.   Cardiovascular: Positive for leg swelling (previously but not currently). Negative for chest pain and palpitations.  Gastrointestinal: Negative for abdominal pain, blood in stool, constipation, diarrhea and nausea.  Endocrine: Negative for polydipsia, polyphagia and polyuria.  Genitourinary: Negative for dysuria and frequency.  Musculoskeletal: Negative for arthralgias and back pain.  Neurological: Positive for headaches. Negative for dizziness.  Hematological: Negative for adenopathy. Does not bruise/bleed easily.  Psychiatric/Behavioral: Positive for dysphoric mood. Negative for self-injury and suicidal ideas. The patient is nervous/anxious.     Objective:   Today's Vitals: BP (!) 201/83   Pulse 96   Temp 98.5 F (36.9 C) (Oral)   Resp 16   Ht 5' 5"  (1.651 m)   Wt 138 lb (62.6 kg)   SpO2 97%   BMI 22.96 kg/m   Physical Exam Vitals and nursing note reviewed.  Constitutional:      Appearance: Normal appearance.  Neck:     Vascular: No carotid bruit.  Cardiovascular:     Rate and Rhythm: Normal rate and regular rhythm.     Pulses:          Dorsalis pedis pulses are 1+ on the right side and 1+ on the left side.       Posterior tibial pulses are 1+ on the right side and 1+ on the left side.  Pulmonary:     Effort: Pulmonary effort is normal.     Breath sounds: Normal breath sounds.  Musculoskeletal:      Cervical back: Normal range of motion and neck supple. No tenderness.     Right lower leg: No edema.     Left lower leg: No edema.     Right foot: Normal range of motion. No deformity, bunion or Charcot foot.     Left foot: Normal range of motion. No deformity, bunion or Charcot foot.       Feet:  Feet:     Right foot:     Protective Sensation: 10 sites tested. 9 sites sensed.     Skin integrity: Dry skin present. No ulcer, blister, skin breakdown, erythema, warmth, callus or fissure.     Toenail Condition: Right toenails are abnormally thick.     Left foot:     Protective Sensation: 10 sites tested. 9 sites sensed.     Skin integrity: Callus and dry skin present. No ulcer, blister, skin breakdown, erythema, warmth or fissure.     Toenail  Condition: Left toenails are abnormally thick.  Lymphadenopathy:     Cervical: No cervical adenopathy.  Neurological:     Mental Status: He is alert and oriented to person, place, and time.     Assessment & Plan:   1. Type 2 diabetes mellitus with stage 3b chronic kidney disease, with long-term current use of insulin ([place]) Most recent Hgb A1c of 9.8 0n 10/01/2019 down from 11.6 on 08/11/2019 and patient reports fasting blood sugars are less than 150 and usually less than 130.  He is provided with refills of losartan for renal protection but made aware that this medication may need to be discontinued if he has continued elevation in creatinine or changes in potassium associated with the use of this medication as he does have known chronic kidney disease.  Refill provided of patient's long and short acting insulins.  Patient's son was present for today's visit and patient's adult son helps patient manage his blood sugars/insulin administration and monitoring of blood sugars.  Referral made for patient to follow-up with the clinical pharmacist for further education regarding diabetes and use of insulin.  Patient will have comprehensive metabolic panel so that  electrolytes/renal function can be checked in follow-up of his diabetes and chronic kidney disease. - POCT glucose (manual entry) - Microalbumin / creatinine urine ratio - losartan (COZAAR) 50 MG tablet; Once daily to lower blood pressure  Dispense: 30 tablet; Refill: 3 - Insulin Glargine (LANTUS) 100 UNIT/ML Solostar Pen; Inject 12 Units into the skin daily.  Dispense: 15 mL; Refill: 0 - insulin aspart (NOVOLOG) 100 UNIT/ML FlexPen; Inject 4 Units into the skin 3 (three) times daily with meals.  Dispense: 15 mL; Refill: 0 - Amb Referral to Clinical Pharmacist - Comprehensive metabolic panel  2. Uncontrolled hypertension Currently prescribed losartan 50 mg and amlodipine 10 mg but has not been compliant with use of medication.  - losartan (COZAAR) 50 MG tablet; Once daily to lower blood pressure  Dispense: 30 tablet; Refill: 3 - amLODipine (NORVASC) 10 MG tablet; Take 1 tablet (10 mg total) by mouth daily.  Dispense: 60 tablet; Refill: 0 - Amb Referral to Clinical Pharmacist - Comprehensive metabolic panel  3. History of CVA (cerebrovascular accident) Keep scheduled follow-up with neurology.  Prescriptions provided for Plavix and aspirin as per hospital discharge.  Importance of control of secondary risk factors such as blood sugar, lipids and blood pressure stressed. - clopidogrel (PLAVIX) 75 MG tablet; Take 1 tablet (75 mg total) by mouth daily.  Dispense: 90 tablet; Refill: 3 - aspirin 81 MG EC tablet; Take 1 tablet (81 mg total) by mouth daily.  Dispense: 30 tablet; Refill: 0  4. Cellulitis of right upper eyelid He is status post emergency department visit with diagnosis of preseptal cellulitis but still has some mild edema and erythema of the right upper eyelid.  He requests refill of antibiotic ointment prescribed previously and prescription given for Keflex.  He may use cool moist washcloths as a compress to help with swelling or discomfort.  If any acute worsening, please go to urgent  care or emergency department. - erythromycin ophthalmic ointment; Place a 1/2 inch ribbon of ointment into the lower eyelid four times a day  Dispense: 3.5 g; Refill: 0 - cephALEXin (KEFLEX) 500 MG capsule; Take 1 capsule (500 mg total) by mouth 3 (three) times daily for 7 days. For eyelid infection  Dispense: 21 capsule; Refill: 0 - loratadine (CLARITIN) 10 MG tablet; Take 1 tablet (10 mg total) by mouth daily.  For sneezing/itchy eyes  Dispense: 30 tablet; Refill: 11  5. Hyperlipidemia associated with type 2 diabetes mellitus ([place]) Refill provided for atorvastatin and discussed importance of compliance with medications for secondary stroke prevention - atorvastatin (LIPITOR) 80 MG tablet; Take 1 tablet (80 mg total) by mouth daily at 6 PM.  Dispense: 30 tablet; Refill: 0  6. Stage 3b chronic kidney disease We will repeat comprehensive metabolic panel and CBC in follow-up of his chronic kidney disease.  Patient reports that unfortunately due to the copayment cost he has been unable to keep his follow-up appointment with nephrology.  Patient has been referred to social work and has Chief Strategy Officer from our office as well as a Glass blower/designer working on helping patient with cost of medications and follow-up - Comprehensive metabolic panel - CBC  7. Encounter for long-term (current) use of medications Comprehensive metabolic panel at today's visit and follow-up of his long-term use of medications for treatment of diabetes, hypertension and statin therapy for hyperlipidemia/stroke  8. Need for follow-up by social worker Referral placed for social work follow-up to help with resources for medication and patient is currently out of work due to recent stroke.  Patient additionally has follow-up with the office RN care manager as well as with an outside care coordinator  Outpatient Encounter Medications as of 11/12/2019  Medication Sig  . amLODipine (NORVASC) 10 MG tablet Take 1 tablet (10 mg total) by  mouth daily. (Patient not taking: Reported on 11/12/2019)  . aspirin EC 81 MG EC tablet Take 1 tablet (81 mg total) by mouth daily. (Patient not taking: Reported on 11/12/2019)  . atorvastatin (LIPITOR) 80 MG tablet Take 1 tablet (80 mg total) by mouth daily at 6 PM. (Patient not taking: Reported on 11/12/2019)  . blood glucose meter kit and supplies KIT Dispense based on patient and insurance preference. Use up to four times daily as directed. (FOR ICD-9 250.00, 250.01). (Patient not taking: Reported on 11/12/2019)  . clopidogrel (PLAVIX) 75 MG tablet Take 75 mg by mouth daily.  Marland Kitchen erythromycin ophthalmic ointment Place a 1/2 inch ribbon of ointment into the lower eyelid four times a day (Patient not taking: Reported on 11/12/2019)  . insulin aspart (NOVOLOG) 100 UNIT/ML FlexPen Inject 4 Units into the skin 3 (three) times daily with meals. (Patient not taking: Reported on 11/12/2019)  . Insulin Glargine (LANTUS) 100 UNIT/ML Solostar Pen Inject 12 Units into the skin daily. (Patient not taking: Reported on 11/12/2019)  . losartan (COZAAR) 50 MG tablet Take 50 mg by mouth daily.  . nicotine (NICODERM CQ - DOSED IN MG/24 HOURS) 21 mg/24hr patch Place 1 patch (21 mg total) onto the skin daily. (Patient not taking: Reported on 11/12/2019)   No facility-administered encounter medications on file as of 11/12/2019.   An After Visit Summary was printed and given to the patient.   Follow-up: Return in about 3 months (around 02/10/2020) for HTN/DM; f/u with Lurena Joiner on Friday or Monday for BP recheck; 1-2 weeks if eyelid is still swollen.   Greater than 30 minutes of face-to-face time was spent with the patient at today's visit.  An additional 20 minutes was spent reviewing patient's prior records/hospitalization as well as entering orders, medication refills, follow-up appointments/referrals and completion of patient's note.  [name] MD

## 2019-11-13 ENCOUNTER — Other Ambulatory Visit: Payer: Self-pay | Admitting: *Deleted

## 2019-11-13 LAB — COMPREHENSIVE METABOLIC PANEL WITH GFR
ALT: 12 IU/L (ref 0–44)
AST: 13 IU/L (ref 0–40)
Albumin/Globulin Ratio: 1.2 (ref 1.2–2.2)
Albumin: 3 g/dL — ABNORMAL LOW (ref 4.0–5.0)
Alkaline Phosphatase: 126 IU/L — ABNORMAL HIGH (ref 39–117)
BUN/Creatinine Ratio: 8 — ABNORMAL LOW (ref 9–20)
BUN: 34 mg/dL — ABNORMAL HIGH (ref 6–24)
Bilirubin Total: <0.2 mg/dL (ref 0.0–1.2)
CO2: 21 mmol/L (ref 20–29)
Calcium: 8.3 mg/dL — ABNORMAL LOW (ref 8.7–10.2)
Chloride: 107 mmol/L — ABNORMAL HIGH (ref 96–106)
Creatinine, Ser: 4.06 mg/dL — ABNORMAL HIGH (ref 0.76–1.27)
GFR calc Af Amer: 19 mL/min/1.73 — ABNORMAL LOW
GFR calc non Af Amer: 16 mL/min/1.73 — ABNORMAL LOW
Globulin, Total: 2.6 g/dL (ref 1.5–4.5)
Glucose: 177 mg/dL — ABNORMAL HIGH (ref 65–99)
Potassium: 6.6 mmol/L (ref 3.5–5.2)
Sodium: 139 mmol/L (ref 134–144)
Total Protein: 5.6 g/dL — ABNORMAL LOW (ref 6.0–8.5)

## 2019-11-13 LAB — MICROALBUMIN / CREATININE URINE RATIO
Creatinine, Urine: 52.8 mg/dL
Microalb/Creat Ratio: 7627 mg/g{creat} — ABNORMAL HIGH (ref 0–29)
Microalbumin, Urine: 4026.8 ug/mL

## 2019-11-13 LAB — CBC
Hematocrit: 34.1 % — ABNORMAL LOW (ref 37.5–51.0)
Hemoglobin: 11.3 g/dL — ABNORMAL LOW (ref 13.0–17.7)
MCH: 27.8 pg (ref 26.6–33.0)
MCHC: 33.1 g/dL (ref 31.5–35.7)
MCV: 84 fL (ref 79–97)
Platelets: 527 x10E3/uL — ABNORMAL HIGH (ref 150–450)
RBC: 4.07 x10E6/uL — ABNORMAL LOW (ref 4.14–5.80)
RDW: 14.5 % (ref 11.6–15.4)
WBC: 11.5 x10E3/uL — ABNORMAL HIGH (ref 3.4–10.8)

## 2019-11-13 NOTE — Patient Outreach (Signed)
[facility]) Care Management  11/13/2019  Phillip Camacho 24-Mar-1971 [id number]   [facility] Case closure  With review of Epic [facility] RN CM notes that [name] did attend his 11/12/19 [facility] [facility] appointment with Dr Chapman Fitch  Also a 02/11/20 appointment is noted for follow up  [facility] RN CM spoke with his daughter [name] on 11/06/19 about [facility] RN Case closure if no return call by 11/12/19 There has not been and return calls from daughter, son nor patient   [facility] RN CM has previously provided a warm transfer of services to [facility] RN CM and SW.    Plans case closure as patient is not on [facility]PL, [facility] services have completed and patient is receiving external care management services via [facility] staff  Route to Dr Levan Hurst L. Lavina Hamman, RN, BSN, [place] Coordinator Office number [phone] Mobile number [phone]  Main [facility] number [phone] Fax number [phone]

## 2019-11-14 ENCOUNTER — Encounter (HOSPITAL_COMMUNITY): Payer: Self-pay | Admitting: Emergency Medicine

## 2019-11-14 ENCOUNTER — Other Ambulatory Visit: Payer: Self-pay

## 2019-11-14 ENCOUNTER — Inpatient Hospital Stay (HOSPITAL_COMMUNITY)
Admission: EM | Admit: 2019-11-14 | Discharge: 2019-11-18 | DRG: 065 | Disposition: A | Discharge status: Home / Self Care | Payer: 59 | Attending: Family Medicine | Admitting: Family Medicine

## 2019-11-14 ENCOUNTER — Telehealth: Payer: Self-pay | Admitting: Family Medicine

## 2019-11-14 ENCOUNTER — Emergency Department (HOSPITAL_COMMUNITY): Payer: 59

## 2019-11-14 DIAGNOSIS — E872 Acidosis: Secondary | ICD-10-CM | POA: Diagnosis not present

## 2019-11-14 DIAGNOSIS — H539 Unspecified visual disturbance: Secondary | ICD-10-CM | POA: Diagnosis present

## 2019-11-14 DIAGNOSIS — I712 Thoracic aortic aneurysm, without rupture: Secondary | ICD-10-CM | POA: Diagnosis present

## 2019-11-14 DIAGNOSIS — N183 Chronic kidney disease, stage 3 unspecified: Secondary | ICD-10-CM | POA: Diagnosis not present

## 2019-11-14 DIAGNOSIS — Z794 Long term (current) use of insulin: Secondary | ICD-10-CM

## 2019-11-14 DIAGNOSIS — H3411 Central retinal artery occlusion, right eye: Secondary | ICD-10-CM | POA: Diagnosis present

## 2019-11-14 DIAGNOSIS — R4701 Aphasia: Secondary | ICD-10-CM | POA: Diagnosis present

## 2019-11-14 DIAGNOSIS — R29700 NIHSS score 0: Secondary | ICD-10-CM | POA: Diagnosis present

## 2019-11-14 DIAGNOSIS — Z20822 Contact with and (suspected) exposure to covid-19: Secondary | ICD-10-CM | POA: Diagnosis present

## 2019-11-14 DIAGNOSIS — I700 Atherosclerosis of aorta: Secondary | ICD-10-CM | POA: Diagnosis present

## 2019-11-14 DIAGNOSIS — Z7902 Long term (current) use of antithrombotics/antiplatelets: Secondary | ICD-10-CM

## 2019-11-14 DIAGNOSIS — Z7982 Long term (current) use of aspirin: Secondary | ICD-10-CM | POA: Diagnosis not present

## 2019-11-14 DIAGNOSIS — Z8673 Personal history of transient ischemic attack (TIA), and cerebral infarction without residual deficits: Secondary | ICD-10-CM | POA: Diagnosis not present

## 2019-11-14 DIAGNOSIS — Z79899 Other long term (current) drug therapy: Secondary | ICD-10-CM

## 2019-11-14 DIAGNOSIS — E1159 Type 2 diabetes mellitus with other circulatory complications: Secondary | ICD-10-CM | POA: Diagnosis not present

## 2019-11-14 DIAGNOSIS — E1121 Type 2 diabetes mellitus with diabetic nephropathy: Secondary | ICD-10-CM | POA: Diagnosis present

## 2019-11-14 DIAGNOSIS — F17200 Nicotine dependence, unspecified, uncomplicated: Secondary | ICD-10-CM | POA: Diagnosis present

## 2019-11-14 DIAGNOSIS — E1122 Type 2 diabetes mellitus with diabetic chronic kidney disease: Secondary | ICD-10-CM | POA: Diagnosis present

## 2019-11-14 DIAGNOSIS — N184 Chronic kidney disease, stage 4 (severe): Secondary | ICD-10-CM | POA: Diagnosis present

## 2019-11-14 DIAGNOSIS — H5461 Unqualified visual loss, right eye, normal vision left eye: Secondary | ICD-10-CM | POA: Diagnosis present

## 2019-11-14 DIAGNOSIS — I6322 Cerebral infarction due to unspecified occlusion or stenosis of basilar arteries: Secondary | ICD-10-CM | POA: Diagnosis not present

## 2019-11-14 DIAGNOSIS — N1832 Chronic kidney disease, stage 3b: Secondary | ICD-10-CM | POA: Diagnosis not present

## 2019-11-14 DIAGNOSIS — E785 Hyperlipidemia, unspecified: Secondary | ICD-10-CM | POA: Diagnosis present

## 2019-11-14 DIAGNOSIS — I129 Hypertensive chronic kidney disease with stage 1 through stage 4 chronic kidney disease, or unspecified chronic kidney disease: Secondary | ICD-10-CM | POA: Diagnosis present

## 2019-11-14 DIAGNOSIS — I63212 Cerebral infarction due to unspecified occlusion or stenosis of left vertebral arteries: Secondary | ICD-10-CM | POA: Diagnosis not present

## 2019-11-14 DIAGNOSIS — E119 Type 2 diabetes mellitus without complications: Secondary | ICD-10-CM

## 2019-11-14 DIAGNOSIS — R29810 Facial weakness: Secondary | ICD-10-CM | POA: Diagnosis present

## 2019-11-14 DIAGNOSIS — I639 Cerebral infarction, unspecified: Secondary | ICD-10-CM | POA: Diagnosis present

## 2019-11-14 DIAGNOSIS — Y92410 Unspecified street and highway as the place of occurrence of the external cause: Secondary | ICD-10-CM | POA: Diagnosis not present

## 2019-11-14 DIAGNOSIS — E1165 Type 2 diabetes mellitus with hyperglycemia: Secondary | ICD-10-CM | POA: Diagnosis present

## 2019-11-14 DIAGNOSIS — I63411 Cerebral infarction due to embolism of right middle cerebral artery: Secondary | ICD-10-CM | POA: Diagnosis not present

## 2019-11-14 DIAGNOSIS — D631 Anemia in chronic kidney disease: Secondary | ICD-10-CM | POA: Diagnosis present

## 2019-11-14 DIAGNOSIS — E875 Hyperkalemia: Secondary | ICD-10-CM | POA: Diagnosis present

## 2019-11-14 DIAGNOSIS — I10 Essential (primary) hypertension: Secondary | ICD-10-CM | POA: Diagnosis not present

## 2019-11-14 DIAGNOSIS — I340 Nonrheumatic mitral (valve) insufficiency: Secondary | ICD-10-CM | POA: Diagnosis not present

## 2019-11-14 DIAGNOSIS — I672 Cerebral atherosclerosis: Secondary | ICD-10-CM | POA: Diagnosis present

## 2019-11-14 DIAGNOSIS — E1169 Type 2 diabetes mellitus with other specified complication: Secondary | ICD-10-CM

## 2019-11-14 DIAGNOSIS — E8809 Other disorders of plasma-protein metabolism, not elsewhere classified: Secondary | ICD-10-CM | POA: Diagnosis not present

## 2019-11-14 DIAGNOSIS — N289 Disorder of kidney and ureter, unspecified: Secondary | ICD-10-CM

## 2019-11-14 DIAGNOSIS — I6381 Other cerebral infarction due to occlusion or stenosis of small artery: Secondary | ICD-10-CM

## 2019-11-14 DIAGNOSIS — H3410 Central retinal artery occlusion, unspecified eye: Secondary | ICD-10-CM | POA: Diagnosis present

## 2019-11-14 DIAGNOSIS — D72829 Elevated white blood cell count, unspecified: Secondary | ICD-10-CM | POA: Diagnosis not present

## 2019-11-14 DIAGNOSIS — I63431 Cerebral infarction due to embolism of right posterior cerebral artery: Secondary | ICD-10-CM | POA: Diagnosis present

## 2019-11-14 DIAGNOSIS — I351 Nonrheumatic aortic (valve) insufficiency: Secondary | ICD-10-CM | POA: Diagnosis not present

## 2019-11-14 DIAGNOSIS — N179 Acute kidney failure, unspecified: Secondary | ICD-10-CM | POA: Diagnosis present

## 2019-11-14 LAB — I-STAT CHEM 8, ED
BUN: 33 mg/dL — ABNORMAL HIGH (ref 6–20)
Calcium, Ion: 1.12 mmol/L — ABNORMAL LOW (ref 1.15–1.40)
Chloride: 108 mmol/L (ref 98–111)
Creatinine, Ser: 5.1 mg/dL — ABNORMAL HIGH (ref 0.61–1.24)
Glucose, Bld: 129 mg/dL — ABNORMAL HIGH (ref 70–99)
HCT: 32 % — ABNORMAL LOW (ref 39.0–52.0)
Hemoglobin: 10.9 g/dL — ABNORMAL LOW (ref 13.0–17.0)
Potassium: 5.7 mmol/L — ABNORMAL HIGH (ref 3.5–5.1)
Sodium: 135 mmol/L (ref 135–145)
TCO2: 21 mmol/L — ABNORMAL LOW (ref 22–32)

## 2019-11-14 LAB — DIFFERENTIAL
Abs Immature Granulocytes: 0.04 10*3/uL (ref 0.00–0.07)
Basophils Absolute: 0.1 10*3/uL (ref 0.0–0.1)
Basophils Relative: 1 %
Eosinophils Absolute: 0.5 10*3/uL (ref 0.0–0.5)
Eosinophils Relative: 5 %
Immature Granulocytes: 0 %
Lymphocytes Relative: 18 %
Lymphs Abs: 1.9 10*3/uL (ref 0.7–4.0)
Monocytes Absolute: 0.6 10*3/uL (ref 0.1–1.0)
Monocytes Relative: 5 %
Neutro Abs: 7.7 10*3/uL (ref 1.7–7.7)
Neutrophils Relative %: 71 %

## 2019-11-14 LAB — GLUCOSE, CAPILLARY: Glucose-Capillary: 89 mg/dL (ref 70–99)

## 2019-11-14 LAB — CBC
HCT: 35.2 % — ABNORMAL LOW (ref 39.0–52.0)
HCT: 32.3 % — ABNORMAL LOW (ref 39.0–52.0)
Hemoglobin: 10.9 g/dL — ABNORMAL LOW (ref 13.0–17.0)
Hemoglobin: 9.9 g/dL — ABNORMAL LOW (ref 13.0–17.0)
MCH: 27.3 pg (ref 26.0–34.0)
MCH: 28 pg (ref 26.0–34.0)
MCHC: 31 g/dL (ref 30.0–36.0)
MCHC: 30.7 g/dL (ref 30.0–36.0)
MCV: 88.2 fL (ref 80.0–100.0)
MCV: 91.5 fL (ref 80.0–100.0)
Platelets: 527 10*3/uL — ABNORMAL HIGH (ref 150–400)
Platelets: 432 10*3/uL — ABNORMAL HIGH (ref 150–400)
RBC: 3.99 MIL/uL — ABNORMAL LOW (ref 4.22–5.81)
RBC: 3.53 MIL/uL — ABNORMAL LOW (ref 4.22–5.81)
RDW: 16.1 % — ABNORMAL HIGH (ref 11.5–15.5)
RDW: 15.9 % — ABNORMAL HIGH (ref 11.5–15.5)
WBC: 10.8 10*3/uL — ABNORMAL HIGH (ref 4.0–10.5)
WBC: 8.6 10*3/uL (ref 4.0–10.5)
nRBC: 0 % (ref 0.0–0.2)
nRBC: 0 % (ref 0.0–0.2)

## 2019-11-14 LAB — SEDIMENTATION RATE: Sed Rate: 81 mm/hr — ABNORMAL HIGH (ref 0–16)

## 2019-11-14 LAB — BASIC METABOLIC PANEL
Anion gap: 8 (ref 5–15)
BUN: 30 mg/dL — ABNORMAL HIGH (ref 6–20)
CO2: 18 mmol/L — ABNORMAL LOW (ref 22–32)
Calcium: 7.6 mg/dL — ABNORMAL LOW (ref 8.9–10.3)
Chloride: 106 mmol/L (ref 98–111)
Creatinine, Ser: 4.54 mg/dL — ABNORMAL HIGH (ref 0.61–1.24)
GFR calc Af Amer: 16 mL/min — ABNORMAL LOW (ref >60)
GFR calc non Af Amer: 14 mL/min — ABNORMAL LOW (ref >60)
Glucose, Bld: 233 mg/dL — ABNORMAL HIGH (ref 70–99)
Potassium: 4.8 mmol/L (ref 3.5–5.1)
Sodium: 132 mmol/L — ABNORMAL LOW (ref 135–145)

## 2019-11-14 LAB — COMPREHENSIVE METABOLIC PANEL
ALT: 13 U/L (ref 0–44)
AST: 10 U/L — ABNORMAL LOW (ref 15–41)
Albumin: 2.3 g/dL — ABNORMAL LOW (ref 3.5–5.0)
Alkaline Phosphatase: 97 U/L (ref 38–126)
Anion gap: 10 (ref 5–15)
BUN: 32 mg/dL — ABNORMAL HIGH (ref 6–20)
CO2: 20 mmol/L — ABNORMAL LOW (ref 22–32)
Calcium: 8.4 mg/dL — ABNORMAL LOW (ref 8.9–10.3)
Chloride: 106 mmol/L (ref 98–111)
Creatinine, Ser: 4.67 mg/dL — ABNORMAL HIGH (ref 0.61–1.24)
GFR calc Af Amer: 16 mL/min — ABNORMAL LOW (ref >60)
GFR calc non Af Amer: 14 mL/min — ABNORMAL LOW (ref >60)
Glucose, Bld: 137 mg/dL — ABNORMAL HIGH (ref 70–99)
Potassium: 5.8 mmol/L — ABNORMAL HIGH (ref 3.5–5.1)
Sodium: 136 mmol/L (ref 135–145)
Total Bilirubin: 0.7 mg/dL (ref 0.3–1.2)
Total Protein: 6.3 g/dL — ABNORMAL LOW (ref 6.5–8.1)

## 2019-11-14 LAB — POTASSIUM: Potassium: 4.7 mmol/L (ref 3.5–5.1)

## 2019-11-14 LAB — RESPIRATORY PANEL BY RT PCR (FLU A&B, COVID)
Influenza A by PCR: NEGATIVE
Influenza B by PCR: NEGATIVE
SARS Coronavirus 2 by RT PCR: NEGATIVE

## 2019-11-14 LAB — PROTIME-INR
INR: 0.9 (ref 0.8–1.2)
Prothrombin Time: 12.1 seconds (ref 11.4–15.2)

## 2019-11-14 LAB — NA AND K (SODIUM & POTASSIUM), RAND UR
Potassium Urine: 23 mmol/L
Sodium, Ur: 75 mmol/L

## 2019-11-14 LAB — APTT: aPTT: 31 seconds (ref 24–36)

## 2019-11-14 LAB — CBG MONITORING, ED: Glucose-Capillary: 101 mg/dL — ABNORMAL HIGH (ref 70–99)

## 2019-11-14 LAB — C-REACTIVE PROTEIN: CRP: 2 mg/dL — ABNORMAL HIGH (ref <1.0)

## 2019-11-14 MED ORDER — ACETAMINOPHEN 325 MG PO TABS
650.0000 mg | ORAL_TABLET | ORAL | Status: DC | PRN
  Administered 2019-11-15 – 2019-11-16 (×3): 650 mg via ORAL
  Filled 2019-11-14 (×3): qty 2

## 2019-11-14 MED ORDER — HYDROCORTISONE (PERIANAL) 2.5 % EX CREA
1.0000 "application " | TOPICAL_CREAM | Freq: Four times a day (QID) | CUTANEOUS | Status: DC | PRN
  Filled 2019-11-14: qty 28.35

## 2019-11-14 MED ORDER — INSULIN ASPART 100 UNIT/ML IV SOLN
10.0000 [IU] | Freq: Once | INTRAVENOUS | Status: AC
  Administered 2019-11-14: 10 [IU] via INTRAVENOUS

## 2019-11-14 MED ORDER — ATORVASTATIN CALCIUM 80 MG PO TABS
80.0000 mg | ORAL_TABLET | Freq: Every day | ORAL | Status: DC
  Administered 2019-11-15 – 2019-11-17 (×3): 80 mg via ORAL
  Filled 2019-11-14 (×3): qty 1

## 2019-11-14 MED ORDER — HEPARIN SODIUM (PORCINE) 5000 UNIT/ML IJ SOLN
5000.0000 [IU] | Freq: Three times a day (TID) | INTRAMUSCULAR | Status: DC
  Administered 2019-11-14 – 2019-11-18 (×11): 5000 [IU] via SUBCUTANEOUS
  Filled 2019-11-14 (×11): qty 1

## 2019-11-14 MED ORDER — ALUM & MAG HYDROXIDE-SIMETH 200-200-20 MG/5ML PO SUSP: 30.0000 mL | ORAL | Status: DC | PRN

## 2019-11-14 MED ORDER — SALINE SPRAY 0.65 % NA SOLN
1.0000 | NASAL | Status: DC | PRN
  Filled 2019-11-14: qty 44

## 2019-11-14 MED ORDER — CLOPIDOGREL BISULFATE 75 MG PO TABS
75.0000 mg | ORAL_TABLET | Freq: Every day | ORAL | Status: DC
  Administered 2019-11-15 – 2019-11-17 (×3): 75 mg via ORAL
  Filled 2019-11-14 (×3): qty 1

## 2019-11-14 MED ORDER — SODIUM CHLORIDE 0.9 % IV SOLN: INTRAVENOUS | Status: AC

## 2019-11-14 MED ORDER — POLYETHYLENE GLYCOL 3350 17 G PO PACK: 17.0000 g | PACK | Freq: Every day | ORAL | Status: DC | PRN

## 2019-11-14 MED ORDER — DEXTROSE 50 % IV SOLN
1.0000 | Freq: Once | INTRAVENOUS | Status: AC
  Administered 2019-11-14: 18:00:00 50 mL via INTRAVENOUS
  Filled 2019-11-14: qty 50

## 2019-11-14 MED ORDER — INSULIN ASPART 100 UNIT/ML ~~LOC~~ SOLN: 0.0000 [IU] | Freq: Every day | SUBCUTANEOUS | Status: DC

## 2019-11-14 MED ORDER — STROKE: EARLY STAGES OF RECOVERY BOOK: Freq: Once | Status: AC

## 2019-11-14 MED ORDER — INSULIN GLARGINE 100 UNIT/ML SOLOSTAR PEN: 12.0000 [IU] | PEN_INJECTOR | Freq: Every day | SUBCUTANEOUS | Status: DC

## 2019-11-14 MED ORDER — LIP MEDEX EX OINT
1.0000 "application " | TOPICAL_OINTMENT | CUTANEOUS | Status: DC | PRN
  Filled 2019-11-14: qty 7

## 2019-11-14 MED ORDER — STROKE: EARLY STAGES OF RECOVERY BOOK
Status: AC
  Filled 2019-11-14: qty 1

## 2019-11-14 MED ORDER — INSULIN ASPART 100 UNIT/ML ~~LOC~~ SOLN
0.0000 [IU] | Freq: Three times a day (TID) | SUBCUTANEOUS | Status: DC
  Administered 2019-11-15: 12:00:00 3 [IU] via SUBCUTANEOUS
  Administered 2019-11-17: 11:00:00 2 [IU] via SUBCUTANEOUS

## 2019-11-14 MED ORDER — SENNOSIDES-DOCUSATE SODIUM 8.6-50 MG PO TABS: 1.0000 | ORAL_TABLET | Freq: Every evening | ORAL | Status: DC | PRN

## 2019-11-14 MED ORDER — SODIUM CHLORIDE 0.9 % IV BOLUS
1000.0000 mL | Freq: Once | INTRAVENOUS | Status: AC
  Administered 2019-11-14: 1000 mL via INTRAVENOUS

## 2019-11-14 MED ORDER — MUSCLE RUB 10-15 % EX CREA
1.0000 "application " | TOPICAL_CREAM | CUTANEOUS | Status: DC | PRN
  Filled 2019-11-14: qty 85

## 2019-11-14 MED ORDER — HYDROCORTISONE 1 % EX CREA
1.0000 "application " | TOPICAL_CREAM | Freq: Three times a day (TID) | CUTANEOUS | Status: DC | PRN
  Filled 2019-11-14: qty 28

## 2019-11-14 MED ORDER — ACETAMINOPHEN 650 MG RE SUPP: 650.0000 mg | RECTAL | Status: DC | PRN

## 2019-11-14 MED ORDER — PHENOL 1.4 % MT LIQD: 1.0000 | OROMUCOSAL | Status: DC | PRN

## 2019-11-14 MED ORDER — SODIUM ZIRCONIUM CYCLOSILICATE 10 G PO PACK
10.0000 g | PACK | Freq: Once | ORAL | Status: AC
  Administered 2019-11-14: 18:00:00 10 g via ORAL
  Filled 2019-11-14: qty 1

## 2019-11-14 MED ORDER — SODIUM ZIRCONIUM CYCLOSILICATE 10 G PO PACK
10.0000 g | PACK | Freq: Once | ORAL | Status: AC
  Administered 2019-11-14: 14:00:00 10 g via ORAL
  Filled 2019-11-14: qty 1

## 2019-11-14 MED ORDER — SODIUM CHLORIDE 0.9 % IV BOLUS: 500.0000 mL | Freq: Once | INTRAVENOUS | Status: DC

## 2019-11-14 MED ORDER — POLYVINYL ALCOHOL 1.4 % OP SOLN
1.0000 [drp] | OPHTHALMIC | Status: DC | PRN
  Filled 2019-11-14: qty 15

## 2019-11-14 MED ORDER — LORATADINE 10 MG PO TABS: 10.0000 mg | ORAL_TABLET | Freq: Every day | ORAL | Status: DC | PRN

## 2019-11-14 MED ORDER — INSULIN GLARGINE 100 UNIT/ML ~~LOC~~ SOLN
12.0000 [IU] | Freq: Every day | SUBCUTANEOUS | Status: DC
  Administered 2019-11-15 – 2019-11-17 (×3): 12 [IU] via SUBCUTANEOUS
  Filled 2019-11-14 (×3): qty 0.12

## 2019-11-14 MED ORDER — ACETAMINOPHEN 160 MG/5ML PO SOLN: 650.0000 mg | ORAL | Status: DC | PRN

## 2019-11-14 MED ORDER — SODIUM CHLORIDE 0.9% FLUSH
3.0000 mL | Freq: Once | INTRAVENOUS | Status: DC
Start: 2019-11-14 — End: 2019-11-18

## 2019-11-14 MED ORDER — ASPIRIN EC 81 MG PO TBEC
81.0000 mg | DELAYED_RELEASE_TABLET | Freq: Every day | ORAL | Status: DC
  Administered 2019-11-15: 08:00:00 81 mg via ORAL
  Filled 2019-11-14 (×2): qty 1
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
## 2019-11-14 NOTE — Telephone Encounter (Signed)
Good morning, I just received a critical potassium result from the lab of 6.6 for this patient from labs dated 11/12/19.  Thank you

## 2019-11-14 NOTE — Telephone Encounter (Signed)
Sure. On call nursing service received the call a few minutes before I was notified. I will look to see if this was dropped by the Lab.
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
## 2019-11-14 NOTE — ED Notes (Signed)
Pt transported to MRI 
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
## 2019-11-14 NOTE — ED Triage Notes (Signed)
Pt presents with son at bedside states "he seems like he is drooping and slurring his speech and he is weak. I got a call from his doctor saying his potassium is 6.6." LKW 0700. Hx of  Recent TIAs. Equal grips and strength. Pt seems lethargic.
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
## 2019-11-14 NOTE — ED Triage Notes (Signed)
EDP Carmin Muskrat consulted on patients presentation. Orders entered.

## 2019-11-14 NOTE — Telephone Encounter (Signed)
Is there any way to find out why this report came in so late? It was already posted and patient was contacted yesterday.
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
## 2019-11-15 ENCOUNTER — Inpatient Hospital Stay (HOSPITAL_COMMUNITY): Payer: 59

## 2019-11-15 DIAGNOSIS — I639 Cerebral infarction, unspecified: Secondary | ICD-10-CM

## 2019-11-15 DIAGNOSIS — H3411 Central retinal artery occlusion, right eye: Secondary | ICD-10-CM

## 2019-11-15 DIAGNOSIS — Z8673 Personal history of transient ischemic attack (TIA), and cerebral infarction without residual deficits: Secondary | ICD-10-CM

## 2019-11-15 DIAGNOSIS — Z794 Long term (current) use of insulin: Secondary | ICD-10-CM

## 2019-11-15 DIAGNOSIS — I351 Nonrheumatic aortic (valve) insufficiency: Secondary | ICD-10-CM

## 2019-11-15 DIAGNOSIS — I10 Essential (primary) hypertension: Secondary | ICD-10-CM

## 2019-11-15 DIAGNOSIS — I340 Nonrheumatic mitral (valve) insufficiency: Secondary | ICD-10-CM

## 2019-11-15 DIAGNOSIS — N289 Disorder of kidney and ureter, unspecified: Secondary | ICD-10-CM

## 2019-11-15 DIAGNOSIS — N1832 Chronic kidney disease, stage 3b: Secondary | ICD-10-CM

## 2019-11-15 DIAGNOSIS — E1121 Type 2 diabetes mellitus with diabetic nephropathy: Secondary | ICD-10-CM

## 2019-11-15 DIAGNOSIS — E875 Hyperkalemia: Secondary | ICD-10-CM

## 2019-11-15 LAB — LIPID PANEL
Cholesterol: 253 mg/dL — ABNORMAL HIGH (ref 0–200)
HDL: 41 mg/dL (ref >40)
LDL Cholesterol: 177 mg/dL — ABNORMAL HIGH (ref 0–99)
Total CHOL/HDL Ratio: 6.2 RATIO
Triglycerides: 173 mg/dL — ABNORMAL HIGH (ref <150)
VLDL: 35 mg/dL (ref 0–40)

## 2019-11-15 LAB — COMPREHENSIVE METABOLIC PANEL
ALT: 12 U/L (ref 0–44)
AST: 13 U/L — ABNORMAL LOW (ref 15–41)
Albumin: 2.1 g/dL — ABNORMAL LOW (ref 3.5–5.0)
Alkaline Phosphatase: 95 U/L (ref 38–126)
Anion gap: 10 (ref 5–15)
BUN: 32 mg/dL — ABNORMAL HIGH (ref 6–20)
CO2: 18 mmol/L — ABNORMAL LOW (ref 22–32)
Calcium: 8.2 mg/dL — ABNORMAL LOW (ref 8.9–10.3)
Chloride: 109 mmol/L (ref 98–111)
Creatinine, Ser: 4.57 mg/dL — ABNORMAL HIGH (ref 0.61–1.24)
GFR calc Af Amer: 16 mL/min — ABNORMAL LOW (ref >60)
GFR calc non Af Amer: 14 mL/min — ABNORMAL LOW (ref >60)
Glucose, Bld: 99 mg/dL (ref 70–99)
Potassium: 5 mmol/L (ref 3.5–5.1)
Sodium: 137 mmol/L (ref 135–145)
Total Bilirubin: 0.3 mg/dL (ref 0.3–1.2)
Total Protein: 5.3 g/dL — ABNORMAL LOW (ref 6.5–8.1)

## 2019-11-15 LAB — POTASSIUM
Potassium: 4.8 mmol/L (ref 3.5–5.1)
Potassium: 4.8 mmol/L (ref 3.5–5.1)
Potassium: 4.9 mmol/L (ref 3.5–5.1)

## 2019-11-15 LAB — CBC
HCT: 32.3 % — ABNORMAL LOW (ref 39.0–52.0)
Hemoglobin: 10 g/dL — ABNORMAL LOW (ref 13.0–17.0)
MCH: 27.4 pg (ref 26.0–34.0)
MCHC: 31 g/dL (ref 30.0–36.0)
MCV: 88.5 fL (ref 80.0–100.0)
Platelets: 496 10*3/uL — ABNORMAL HIGH (ref 150–400)
RBC: 3.65 MIL/uL — ABNORMAL LOW (ref 4.22–5.81)
RDW: 15.9 % — ABNORMAL HIGH (ref 11.5–15.5)
WBC: 11.6 10*3/uL — ABNORMAL HIGH (ref 4.0–10.5)
nRBC: 0 % (ref 0.0–0.2)

## 2019-11-15 LAB — GLUCOSE, CAPILLARY
Glucose-Capillary: 79 mg/dL (ref 70–99)
Glucose-Capillary: 156 mg/dL — ABNORMAL HIGH (ref 70–99)
Glucose-Capillary: 97 mg/dL (ref 70–99)
Glucose-Capillary: 98 mg/dL (ref 70–99)

## 2019-11-15 LAB — MAGNESIUM: Magnesium: 2.2 mg/dL (ref 1.7–2.4)

## 2019-11-15 LAB — HEMOGLOBIN A1C
Hgb A1c MFr Bld: 8.6 % — ABNORMAL HIGH (ref 4.8–5.6)
Mean Plasma Glucose: 200.12 mg/dL

## 2019-11-15 LAB — ECHOCARDIOGRAM COMPLETE
Height: 65 in
Weight: 2059.98 oz

## 2019-11-15 MED ORDER — EZETIMIBE 10 MG PO TABS
10.0000 mg | ORAL_TABLET | Freq: Every day | ORAL | Status: DC
  Administered 2019-11-15 – 2019-11-18 (×4): 10 mg via ORAL
  Filled 2019-11-15 (×4): qty 1

## 2019-11-15 MED ORDER — ASPIRIN EC 325 MG PO TBEC
325.0000 mg | DELAYED_RELEASE_TABLET | Freq: Every day | ORAL | Status: DC
  Administered 2019-11-16 – 2019-11-17 (×2): 325 mg via ORAL
  Filled 2019-11-15 (×2): qty 1

## 2019-11-15 MED ORDER — LEVETIRACETAM IN NACL 1000 MG/100ML IV SOLN: 1000.0000 mg | Freq: Two times a day (BID) | INTRAVENOUS | Status: DC
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
## 2019-11-15 NOTE — Progress Notes (Signed)
Paged Dr. Oneida Alar about MRA Chest W order. PT GFR is 14. Do to low GFR, unable to perform study. Nurse is aware and will also try to reach the ordering Dr. To notify.
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
## 2019-11-15 NOTE — Consult Note (Signed)
Referring Physician: Dr [name]  Patient name: Phillip Camacho MRN: [id number] DOB: 09-17-1971 Sex: male  REASON FOR CONSULT: stroke possible aortic source  HPI: Phillip Camacho is a 49 y.o. male, with stroke Oct 2020 TEE showed moderate atherosclerosis of arch.  He has since had repeat stroke in 12/20 and with this hospital admission.  Pt was placed on ASA plavix and statin after his last stroke with arch or aorta thought to be possible embolic source.  Imaging options have been limited due to renal dysfunction.  Creatinine was 2.5 in October and is now 4.5.    His stroke symptoms consisted primarily of some weakness and numbness of his hands and slowed speech.    Other medical problems include diabetes and hypertension which have been stable.     Past Medical History:  Diagnosis Date  . Diabetes mellitus without complication ([place])   . Stroke [facility])    Past Surgical History:  Procedure Laterality Date  . BUBBLE STUDY  08/13/2019   Procedure: BUBBLE STUDY;  Surgeon: [name], MD;  Location: [place] [facility] ENDOSCOPY;  Service: Cardiovascular;;  . LOOP RECORDER INSERTION N/A 08/13/2019   Procedure: LOOP RECORDER INSERTION;  Surgeon: [name], MD;  Location: [place] CV LAB;  Service: Cardiovascular;  Laterality: N/A;  . TEE WITHOUT CARDIOVERSION N/A 08/13/2019   Procedure: TRANSESOPHAGEAL ECHOCARDIOGRAM (TEE);  Surgeon: [name], MD;  Location: [facility] ENDOSCOPY;  Service: Cardiovascular;  Laterality: N/A;  PATIENT NEEDS LOOP    Family History  Problem Relation Age of Onset  . Other Neg Hx        patient denies any particular family medical history    SOCIAL HISTORY: Social History   Socioeconomic History  . Marital status: Single    Spouse name: Not on file  . Number of children: 3  . Years of education: Not on file  . Highest education level: Not on file  Occupational History  . 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 Smoking status: Current Every Day Smoker  . Smokeless tobacco: Never Used  Substance and Sexual Activity  . Alcohol use: No  . Drug use: Not on file  . Sexual activity: Not on file  Other Topics Concern  . Not on file  Social History Narrative   Phillip Camacho is [place] in [place] with work green card   He is able to understand some Fisher translator needed at times   His Daughter [name] is primary caregiver but lives in [place] in [place] in November 2020   He has a son living in [place] with [name]   He has a son who lives in [place]- assists only prn   November 2020 -he is unemployed- previous job at Company secretary in [place] Strain: High Risk  . Difficulty of Paying Living Expenses: Very hard  Food Insecurity: Food Insecurity Present  . Worried About Charity fundraiser in the Last Year: Often true  . Ran Out of Food in the Last Year: Often true  Transportation Needs: Unmet Transportation Needs  . Lack of Transportation (Medical): Yes  . Lack of Transportation (Non-Medical): Yes  Physical Activity:   . Days of Exercise per Week: Not on file  . Minutes of Exercise per Session: Not on file  Stress:   . Feeling of Stress :  Not on file  Social Connections: Unknown  . Frequency of Communication with Friends and Family: More than three times a week  . Frequency of Social Gatherings with Friends and Family: Once a week  . Attends Religious Services: Never  . Active Member of Clubs or Organizations: No  . Attends Archivist Meetings: Never  . Marital Status: Not asked  Intimate Partner Violence:   . Fear of Current or Ex-Partner: Not on file  . Emotionally Abused: Not on file  . Physically Abused: Not on file  . Sexually Abused: Not on file    No Known Allergies  Current Facility-Administered Medications  Medication Dose Route Frequency Provider Last Rate Last Admin  . 0.9 %  sodium  chloride infusion   Intravenous Continuous Damita Lack, MD 75 mL/hr at 11/15/19 0815 New Bag at 11/15/19 0815  . acetaminophen (TYLENOL) tablet 650 mg  650 mg Oral Q4H PRN Amin, Ankit Chirag, MD       Or  . acetaminophen (TYLENOL) 160 MG/5ML solution 650 mg  650 mg Per Tube Q4H PRN Amin, Ankit Chirag, MD       Or  . acetaminophen (TYLENOL) suppository 650 mg  650 mg Rectal Q4H PRN Amin, Ankit Chirag, MD      . alum & mag hydroxide-simeth (MAALOX/MYLANTA) 200-200-20 MG/5ML suspension 30 mL  30 mL Oral Q4H PRN Amin, Jeanella Flattery, MD      . Derrill Memo ON 11/16/2019] aspirin EC tablet 325 mg  325 mg Oral Daily Rosalin Hawking, MD      . atorvastatin (LIPITOR) tablet 80 mg  80 mg Oral q1800 Amin, Ankit Chirag, MD      . clopidogrel (PLAVIX) tablet 75 mg  75 mg Oral Daily Amin, Ankit Chirag, MD   75 mg at 11/15/19 0816  . ezetimibe (ZETIA) tablet 10 mg  10 mg Oral Daily Rosalin Hawking, MD   10 mg at 11/15/19 1451  . heparin injection 5,000 Units  5,000 Units Subcutaneous Q8H Amin, Ankit Chirag, MD   5,000 Units at 11/15/19 1451  . hydrocortisone (ANUSOL-HC) 2.5 % rectal cream 1 application  1 application Topical QID PRN Amin, Ankit Chirag, MD      . hydrocortisone cream 1 % 1 application  1 application Topical TID PRN Amin, Ankit Chirag, MD      . insulin aspart (novoLOG) injection 0-15 Units  0-15 Units Subcutaneous TID WC Amin, Ankit Chirag, MD   3 Units at 11/15/19 1144  . insulin aspart (novoLOG) injection 0-5 Units  0-5 Units Subcutaneous QHS Amin, Ankit Chirag, MD      . insulin glargine (LANTUS) injection 12 Units  12 Units Subcutaneous Daily Damita Lack, MD   12 Units at 11/15/19 0816  . lip balm (CARMEX) ointment 1 application  1 application Topical PRN Amin, Ankit Chirag, MD      . loratadine (CLARITIN) tablet 10 mg  10 mg Oral Daily PRN Amin, Ankit Chirag, MD      . Muscle Rub CREA 1 application  1 application Topical PRN Amin, Ankit Chirag, MD      . phenol (CHLORASEPTIC) mouth spray 1  spray  1 spray Mouth/Throat PRN Amin, Ankit Chirag, MD      . polyethylene glycol (MIRALAX / GLYCOLAX) packet 17 g  17 g Oral Daily PRN Amin, Ankit Chirag, MD      . polyvinyl alcohol (LIQUIFILM TEARS) 1.4 % ophthalmic solution 1 drop  1 drop Both Eyes PRN Amin, Jeanella Flattery, MD      .  senna-docusate (Senokot-S) tablet 1 tablet  1 tablet Oral QHS PRN Amin, Ankit Chirag, MD      . sodium chloride (OCEAN) 0.65 % nasal spray 1 spray  1 spray Each Nare PRN Amin, Ankit Chirag, MD      . sodium chloride flush (NS) 0.9 % injection 3 mL  3 mL Intravenous Once Carmin Muskrat, MD        ROS:   Unable to obtain pt does not speak English  Physical Examination  Vitals:   11/15/19 0418 11/15/19 0804 11/15/19 0808 11/15/19 1136  BP: (!) 176/70 (!) 164/64 (!) 173/67 (!) 159/62  Pulse: 89 (!) 105 97 87  Resp: 16 (!) _0 Temp: 98.1 F (36.7 C) 98.2 F (36.8 C) 98.2 F (36.8 C) 98.9 F (37.2 C)  TempSrc: Oral Oral Oral Oral  SpO2: 99% 99% 100% 95%  Weight:      Height:        Body mass index is 21.42 kg/m.  General:  Alert and oriented, no acute distress HEENT: Normal Neck: No JVD Pulmonary: Clear to auscultation bilaterally Cardiac: Regular Rate and Rhythm Skin: No rash Extremity Pulses:  2+ radial, brachial, femoral pulses bilaterally Musculoskeletal: No deformity or edema  Neurologic: Upper and lower extremity motor 5/5 and symmetric  DATA:  MRI brain 12.22 acute subacute stroke thalamus, frontal, right internal capsule, old right frontal occipital.  Chronic lacunes thalamus basal ganglia cerebellum.  MRA intracranial disease  ICA MCA PCA  MRA neck verts carotids no stenosis  TEE today no comment on aortic arch  TEE 10/20 "moderate aortic arch plaque" but no mention of exophytic or mobile plaque  ASSESSMENT:  Pt with recent stroke thought to possibly be aortic arch source although pt also has significant intracranial disease and lacunar infarcts.  Risk of recurrent  stroke with arch source is fairly high 5-7% even with maximal medical therapy.  Consideration could be given for warfarin or NOAC but renal function would weigh into this.  Arch could be possible source but this has not really been adequately imaged.  To determine if there is any mechanical fixable area in the arch pt would need better imaging.  MRA might be possible but usually this is not the greatest imaging modality and CTA chest and neck would be preferable.  Would wait to see if he can recover some renal function if we were to pursue further workup for possible arch source.  Otherwise medical therapy and would have Neurology input on whether to go with higher level of anticoagulation  PLAN:  See above  Plan d/w Dr [name].  We will see if image quality is reasonable with MRA of chest.  I have ordered the test.   Ruta Hinds, MD [facility] Office: [phone] Pager: [phone]

## 2019-11-15 NOTE — Consult Note (Signed)
Phillip Camacho Admit Date: 11/14/2019 11/15/2019 [name] Requesting Physician:  [name] MD  Reason for Consult:  Nephrotic progressive CKD HPI:  6M admitted 1/22 and found to have recurrent multifocal ischemic CVA.  Pt has extensive recent history including admission last month with multifocal ischemic CVA and central retinal artery occlusion.  Since admission here he has been followed by neurology and was found to have new acute left thalamic and internal capsule ischemic CVA.  He currently is on aspirin and clopidogrel.  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  His other history includes DM 2, hypertension, hyperlipidemia, tobacco use.  He has known nephrotic CKD.  He follows with Dr. [name] in [facility].  He was seen 11/25 where nephrotic late stage CKD was identified with a UPC of 12.5.  Testing for HIV, hepatitis C, hepatitis B are reported to be negative.  Other work-up unclear.  He was started on losartan for his nephrotic proteinuria, 50 mg daily.  Prior to his admission he was seen by primary care where hyperkalemia was identified on 1/20 with a value of 6.6, 2 days later when he presented it was 5.8.  The trend in his serum creatinine is below.  Today his creatinine was 4.6 with potassium of 5.0, bicarbonate 18.  Patient denies use of nonsteroidals.  He has no edema whatsoever.  No dyspnea.  He denies lower urinary tract symptoms.  07/2019 renal ultrasound showed normal-sized kidneys with no evidence of obstruction.  There was increased echogenicity bilaterally.   Creatinine, Ser (mg/dL)  Date Value  11/15/2019 4.57 (H)  11/14/2019 4.54 (H)  11/14/2019 5.10 (H)  11/14/2019 4.67 (H)  11/12/2019 4.06 (H)  10/02/2019 3.63 (H)  10/01/2019 3.52 (H)  09/30/2019 3.80 (H)  09/29/2019 3.77 (H)  08/14/2019 2.67 (H)  ] I/Os: Urine output not being quantified  ROS NSAIDS: Denies use IV Contrast no exposure TMP/SMX no exposure  identified Hypotension not present Balance of 12 systems is negative w/ exceptions as above  PMH  Past Medical History:  Diagnosis Date  . Diabetes mellitus without complication ([name])   . Stroke [facility])    [place]  Past Surgical History:  Procedure Laterality Date  . BUBBLE STUDY  08/13/2019   Procedure: BUBBLE STUDY;  Surgeon: [name], MD;  Location: [facility] ENDOSCOPY;  Service: Cardiovascular;;  . LOOP RECORDER INSERTION N/A 08/13/2019   Procedure: LOOP RECORDER INSERTION;  Surgeon: [name], MD;  Location: [place] CV LAB;  Service: Cardiovascular;  Laterality: N/A;  . TEE WITHOUT CARDIOVERSION N/A 08/13/2019   Procedure: TRANSESOPHAGEAL ECHOCARDIOGRAM (TEE);  Surgeon: [name], MD;  Location: [facility] ENDOSCOPY;  Service: Cardiovascular;  Laterality: N/A;  PATIENT NEEDS LOOP   FH  Family History  Problem Relation Age of Onset  . Other Neg Hx        patient denies any particular family medical history   SH  reports that he has been smoking. He has never used smokeless tobacco. He reports that he does not drink alcohol. No history on file for drug. Allergies No Known Allergies Home medications Prior to Admission medications   Medication Sig Start Date End Date Taking? Authorizing Provider  acetaminophen (TYLENOL) 650 MG CR tablet Take 1,300 mg by mouth every 8 (eight) hours as needed for pain.   Yes [provider]  amLODipine (NORVASC) 10 MG tablet Take 1 tablet (10 mg total) by mouth daily. 11/12/19  Yes Fulp, Cammie, MD  atorvastatin (  LIPITOR) 80 MG tablet Take 1 tablet (80 mg total) by mouth daily at 6 PM. 11/12/19  Yes Fulp, Cammie, MD  cephALEXin (KEFLEX) 500 MG capsule Take 1 capsule (500 mg total) by mouth 3 (three) times daily for 7 days. For eyelid infection 11/12/19 11/19/19 Yes Fulp, Cammie, MD  clopidogrel (PLAVIX) 75 MG tablet Take 1 tablet (75 mg total) by mouth daily. 11/12/19  Yes Fulp, Cammie, MD  erythromycin ophthalmic ointment Place a 1/2 inch  ribbon of ointment into the lower eyelid four times a day Patient taking differently: Place 1 application into both eyes See admin instructions. Place a 1/2 inch ribbon of ointment into the lower eyelid four times a day 11/12/19  Yes Fulp, Cammie, MD  insulin aspart (NOVOLOG) 100 UNIT/ML FlexPen Inject 4 Units into the skin 3 (three) times daily with meals. 11/12/19  Yes Fulp, Cammie, MD  Insulin Glargine (LANTUS) 100 UNIT/ML Solostar Pen Inject 12 Units into the skin daily. 11/12/19  Yes Fulp, Cammie, MD  Latanoprost (XELPROS) 0.005 % EMUL Place 1 drop into both eyes at bedtime.   Yes [provider]  loratadine (CLARITIN) 10 MG tablet Take 1 tablet (10 mg total) by mouth daily. For sneezing/itchy eyes 11/12/19  Yes Fulp, Cammie, MD  losartan (COZAAR) 50 MG tablet Once daily to lower blood pressure Patient taking differently: Take 50 mg by mouth daily. to lower blood pressure 11/12/19  Yes Fulp, Cammie, MD  timolol (BETIMOL) 0.25 % ophthalmic solution Place 1 drop into both eyes 2 (two) times daily.   Yes [provider]  aspirin 81 MG EC tablet Take 1 tablet (81 mg total) by mouth daily. Patient not taking: Reported on 11/14/2019 11/12/19   [name], MD  blood glucose meter kit and supplies KIT Dispense based on patient and insurance preference. Use up to four times daily as directed. (FOR ICD-9 250.00, 250.01). 08/14/19   [name], DO  nicotine (NICODERM CQ - DOSED IN MG/24 HOURS) 21 mg/24hr patch Place 1 patch (21 mg total) onto the skin daily. Patient not taking: Reported on 11/12/2019 08/15/19   [name], DO    Current Medications Scheduled Meds: . [START ON 11/16/2019] aspirin EC  325 mg Oral Daily  . atorvastatin  80 mg Oral q1800  . clopidogrel  75 mg Oral Daily  . ezetimibe  10 mg Oral Daily  . heparin  5,000 Units Subcutaneous Q8H  . insulin aspart  0-15 Units Subcutaneous TID WC  . insulin aspart  0-5 Units Subcutaneous QHS  . insulin glargine  12 Units  Subcutaneous Daily  . sodium chloride flush  3 mL Intravenous Once   Continuous Infusions: . sodium chloride 75 mL/hr at 11/15/19 0815   PRN Meds:.acetaminophen **OR** acetaminophen (TYLENOL) oral liquid 160 mg/5 mL **OR** acetaminophen, alum & mag hydroxide-simeth, hydrocortisone, hydrocortisone cream, lip balm, loratadine, Muscle Rub, phenol, polyethylene glycol, polyvinyl alcohol, senna-docusate, sodium chloride  CBC Recent Labs  Lab 11/14/19 1305 11/14/19 1305 11/14/19 1324 11/14/19 1750 11/15/19 0110  WBC 10.8*  --   --  8.6 11.6*  NEUTROABS 7.7  --   --   --   --   HGB 10.9*   < > 10.9* 9.9* 10.0*  HCT 35.2*   < > 32.0* 32.3* 32.3*  MCV 88.2  --   --  91.5 88.5  PLT 527*  --   --  432* 496*   < > = values in this interval not displayed.   Basic Metabolic  Panel Recent Labs  Lab 11/12/19 1616 11/12/19 1616 11/14/19 1305 11/14/19 1324 11/14/19 1750 11/14/19 2215 11/15/19 0110 11/15/19 1125 11/15/19 1217  NA 139  --  136 135 132*  --  137  --   --   K 6.6*   < > 5.8* 5.7* 4.8 4.7 5.0 4.8 4.8  CL 107*  --  106 108 106  --  109  --   --   CO2 21  --  20*  --  18*  --  18*  --   --   GLUCOSE 177*  --  137* 129* 233*  --  99  --   --   BUN 34*  --  32* 33* 30*  --  32*  --   --   CREATININE 4.06*  --  4.67* 5.10* 4.54*  --  4.57*  --   --   CALCIUM 8.3*  --  8.4*  --  7.6*  --  8.2*  --   --    < > = values in this interval not displayed.    Physical Exam  Blood pressure (!) 159/62, pulse 87, temperature 98.9 F (37.2 C), temperature source Oral, resp. rate 16, height 5' 5"  (1.651 m), weight 58.4 kg, SpO2 95 %. GEN: Appears older than stated age, chronically ill-appearing, resting quietly ENT: Fair dentition, NCAT EYES: EOMI CV: Regular, normal S1 and S2, no rub PULM: Clear bilaterally ABD: Bowel sounds present, scaphoid, soft, nontender, no rebound or guarding, no HSM SKIN: No rashes or lesions, no petechia/purpura EXT: No peripheral edema  whatsoever  Assessment 19M with recurrent multifocal CVAs and progressive nephrotic CKD presumed from DM2  1. Progressive nephrotic CKD4; outpatient nephrology notes reviewed, negative serological work-up.  Presumably this is diabetic nephropathy with rapid GFR decline.  No indication for biopsy given dual antiplatelet therapy and low GFR. 2. Recurrent multifocal ischemic CVAs/CRAO; per primary and neurology 3. Plaque in the aortic arch, PVD asked to evaluate 4. Hyperkalemia after initiation of losartan, now held, potassium normalized 5. Nephrotic proteinuria, as above; hypoalbuminemia 6. Mild metabolic acidosis, will consider use of sodium bicarbonate 7. DM2, uncontrolled 8. Hypertension, mildly elevated, per neurology given acute CVA 9. Anemia, mild  Plan 1. Hopefully some of his recent GFR decline and hyperkalemia is related to the initiation of losartan in December.  If so we should see some improvements over the next several days. 2. Even so, he appears to have fairly rapid progression, consistent with diabetic nephropathy.  He will need close follow-up with outpatient nephrology, Dr. [name], for dialysis education and planning 3. I do not think we need another ultrasound at the current time nor do we need to requantify his proteinuria 4. We will continue to follow along 5. Daily weights, Daily Renal Panel, Strict I/Os, Avoid nephrotoxins (NSAIDs, judicious IV Contrast)    [name]  [phone] 11/15/2019, 2:50 PM

## 2019-11-15 NOTE — Progress Notes (Signed)
SLP Cancellation Note  Patient Details Name: Phillip Camacho MRN: [id number] DOB: 12/20/1970   Cancelled treatment:       Reason Eval/Treat Not Completed: Patient at procedure or test/unavailable   Elvina Sidle, M.S.,CCC-SLP 11/15/2019, 11:45 AM

## 2019-11-15 NOTE — Progress Notes (Signed)
  Echocardiogram 2D Echocardiogram has been performed.  Matilde Bash 11/15/2019, 10:05 AM
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
## 2019-11-16 DIAGNOSIS — N184 Chronic kidney disease, stage 4 (severe): Secondary | ICD-10-CM

## 2019-11-16 DIAGNOSIS — N179 Acute kidney failure, unspecified: Secondary | ICD-10-CM

## 2019-11-16 DIAGNOSIS — E1159 Type 2 diabetes mellitus with other circulatory complications: Secondary | ICD-10-CM

## 2019-11-16 LAB — GLUCOSE, CAPILLARY
Glucose-Capillary: 68 mg/dL — ABNORMAL LOW (ref 70–99)
Glucose-Capillary: 110 mg/dL — ABNORMAL HIGH (ref 70–99)
Glucose-Capillary: 83 mg/dL (ref 70–99)
Glucose-Capillary: 109 mg/dL — ABNORMAL HIGH (ref 70–99)
Glucose-Capillary: 67 mg/dL — ABNORMAL LOW (ref 70–99)
Glucose-Capillary: 85 mg/dL (ref 70–99)

## 2019-11-16 LAB — COMPREHENSIVE METABOLIC PANEL
ALT: 12 U/L (ref 0–44)
AST: 13 U/L — ABNORMAL LOW (ref 15–41)
Albumin: 2.3 g/dL — ABNORMAL LOW (ref 3.5–5.0)
Alkaline Phosphatase: 98 U/L (ref 38–126)
Anion gap: 9 (ref 5–15)
BUN: 32 mg/dL — ABNORMAL HIGH (ref 6–20)
CO2: 21 mmol/L — ABNORMAL LOW (ref 22–32)
Calcium: 8.6 mg/dL — ABNORMAL LOW (ref 8.9–10.3)
Chloride: 107 mmol/L (ref 98–111)
Creatinine, Ser: 4.29 mg/dL — ABNORMAL HIGH (ref 0.61–1.24)
GFR calc Af Amer: 18 mL/min — ABNORMAL LOW (ref >60)
GFR calc non Af Amer: 15 mL/min — ABNORMAL LOW (ref >60)
Glucose, Bld: 79 mg/dL (ref 70–99)
Potassium: 4.4 mmol/L (ref 3.5–5.1)
Sodium: 137 mmol/L (ref 135–145)
Total Bilirubin: 0.6 mg/dL (ref 0.3–1.2)
Total Protein: 5.8 g/dL — ABNORMAL LOW (ref 6.5–8.1)

## 2019-11-16 LAB — CBC
HCT: 34.5 % — ABNORMAL LOW (ref 39.0–52.0)
Hemoglobin: 10.9 g/dL — ABNORMAL LOW (ref 13.0–17.0)
MCH: 27.5 pg (ref 26.0–34.0)
MCHC: 31.6 g/dL (ref 30.0–36.0)
MCV: 86.9 fL (ref 80.0–100.0)
Platelets: 612 10*3/uL — ABNORMAL HIGH (ref 150–400)
RBC: 3.97 MIL/uL — ABNORMAL LOW (ref 4.22–5.81)
RDW: 15.8 % — ABNORMAL HIGH (ref 11.5–15.5)
WBC: 10.2 10*3/uL (ref 4.0–10.5)
nRBC: 0 % (ref 0.0–0.2)

## 2019-11-16 LAB — MAGNESIUM: Magnesium: 2.2 mg/dL (ref 1.7–2.4)

## 2019-11-16 LAB — PLATELET INHIBITION P2Y12: Platelet Function  P2Y12: 232 [PRU] (ref 182–335)

## 2019-11-16 MED ORDER — LOSARTAN POTASSIUM 50 MG PO TABS
50.0000 mg | ORAL_TABLET | Freq: Every day | ORAL | Status: DC
  Administered 2019-11-16 – 2019-11-18 (×3): 50 mg via ORAL
  Filled 2019-11-16 (×3): qty 1

## 2019-11-16 MED ORDER — AMLODIPINE BESYLATE 10 MG PO TABS
10.0000 mg | ORAL_TABLET | Freq: Every day | ORAL | Status: DC
  Administered 2019-11-16 – 2019-11-18 (×3): 10 mg via ORAL
  Filled 2019-11-16 (×3): qty 1

## 2019-11-16 MED ORDER — HYDRALAZINE HCL 25 MG PO TABS: 25.0000 mg | ORAL_TABLET | Freq: Four times a day (QID) | ORAL | Status: DC | PRN
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
## 2019-11-16 NOTE — Progress Notes (Signed)
CBG 85, pt doing okay
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
## 2019-11-16 NOTE — Progress Notes (Signed)
Pt's CBG 67, gave him juice to drink will recheck in 49min, no signs of hypoglycemia noted,  Still refusing bed alarms, educated but he still doesn't want bed alarms ON, will continue to monitor

## 2019-11-16 NOTE — Progress Notes (Signed)
MRA still pending.  Will follow up with pt post MRA chest  Ruta Hinds, MD [facility] Office: [phone]

## 2019-11-16 NOTE — Progress Notes (Signed)
PT Cancellation Note  Patient Details Name: Phillip Camacho MRN: [id number] DOB: August 15, 1971   Cancelled Treatment:    Reason Eval/Treat Not Completed: Patient declined, no reason specified. Will check back as schedule permits.   Galen Manila 11/16/2019, 3:10 PM

## 2019-11-16 NOTE — Progress Notes (Signed)
Admit: 11/14/2019 LOS: 2  7M with recurrent multifocal CVAs and progressive nephrotic CKD presumed from DM2  Subjective:  Marland Kitchen Vascular following for aortic arch plaque, had MRA . 1.2 L urine output  . creatinine improved to 4.3, K4.4  01/23 0701 - 01/24 0700 In: 2278.5 [P.O.:1175; I.V.:1103.5] Out: 1200 [Urine:1200]  Filed Weights   11/14/19 2217  Weight: 58.4 kg    Scheduled Meds: . aspirin EC  325 mg Oral Daily  . atorvastatin  80 mg Oral q1800  . clopidogrel  75 mg Oral Daily  . ezetimibe  10 mg Oral Daily  . heparin  5,000 Units Subcutaneous Q8H  . insulin aspart  0-15 Units Subcutaneous TID WC  . insulin aspart  0-5 Units Subcutaneous QHS  . insulin glargine  12 Units Subcutaneous Daily  . sodium chloride flush  3 mL Intravenous Once   Continuous Infusions: PRN Meds:.acetaminophen **OR** acetaminophen (TYLENOL) oral liquid 160 mg/5 mL **OR** acetaminophen, alum & mag hydroxide-simeth, hydrALAZINE, hydrocortisone, hydrocortisone cream, lip balm, loratadine, Muscle Rub, phenol, polyethylene glycol, polyvinyl alcohol, senna-docusate, sodium chloride  Current Labs: reviewed    Physical Exam:  Blood pressure (!) 180/66, pulse 86, temperature 98 F (36.7 C), temperature source Oral, resp. rate 15, height 5\' 5"  (1.651 m), weight 58.4 kg, SpO2 98 %.  GEN: Appears older than stated age, chronically ill-appearing, resting quietly ENT: Fair dentition, NCAT EYES: EOMI CV: Regular, normal S1 and S2, no rub PULM: Clear bilaterally ABD: Bowel sounds present, scaphoid, soft, nontender, no rebound or guarding, no HSM SKIN: No rashes or lesions, no petechia/purpura EXT: No peripheral edema whatsoever  A 1. Progressive nephrotic CKD4; outpatient nephrology notes reviewed, negative serological work-up.  Presumably this is diabetic nephropathy with rapid GFR decline.  No indication for biopsy given dual antiplatelet therapy and low GFR. 2. Recurrent multifocal ischemic CVAs/CRAO; per  primary and neurology 3. Plaque in the aortic arch, PVD asked to evaluate 4. Hyperkalemia after initiation of losartan, now held, potassium normalized 5. Nephrotic proteinuria, as above; hypoalbuminemia 6. Mild metabolic acidosis, stable, mild, would not treat with sodium bicarbonate at the current time 7. DM2, uncontrolled 8. Hypertension, elevated, per neurology given acute CVA 9. Anemia, mild  P . Improvement in GFR from admission suggest that his losartan was partially contributing . He still has late stage nephrotic CKD will need to follow closely with outpatient nephrology for optimal outcomes, dialysis planning . I would not resume any RAAS inhibitor at discharge . No further suggestions at the current time, will sign off . Medication Issues; o Preferred narcotic agents for pain control are hydromorphone, fentanyl, and methadone. Morphine should not be used.  o Baclofen should be avoided o Avoid oral sodium phosphate and magnesium citrate based laxatives / bowel preps    Pearson Grippe MD 11/16/2019, 11:22 AM  Recent Labs  Lab 11/14/19 1750 11/14/19 2215 11/15/19 0110 11/15/19 1125 11/15/19 1217 11/15/19 1641 11/16/19 0540  NA 132*  --  137  --   --   --  137  K 4.8   < > 5.0   < > 4.8 4.9 4.4  CL 106  --  109  --   --   --  107  CO2 18*  --  18*  --   --   --  21*  GLUCOSE 233*  --  99  --   --   --  79  BUN 30*  --  32*  --   --   --  32*  CREATININE 4.54*  --  4.57*  --   --   --  4.29*  CALCIUM 7.6*  --  8.2*  --   --   --  8.6*   < > = values in this interval not displayed.   Recent Labs  Lab 11/14/19 1305 11/14/19 1324 11/14/19 1750 11/15/19 0110 11/16/19 0540  WBC 10.8*   < > 8.6 11.6* 10.2  NEUTROABS 7.7  --   --   --   --   HGB 10.9*   < > 9.9* 10.0* 10.9*  HCT 35.2*   < > 32.3* 32.3* 34.5*  MCV 88.2   < > 91.5 88.5 86.9  PLT 527*   < > 432* 496* 612*   < > = values in this interval not displayed.

## 2019-11-17 ENCOUNTER — Ambulatory Visit: Payer: Self-pay | Admitting: Pharmacist

## 2019-11-17 ENCOUNTER — Inpatient Hospital Stay (HOSPITAL_COMMUNITY): Payer: 59

## 2019-11-17 DIAGNOSIS — I63212 Cerebral infarction due to unspecified occlusion or stenosis of left vertebral arteries: Secondary | ICD-10-CM

## 2019-11-17 DIAGNOSIS — I6322 Cerebral infarction due to unspecified occlusion or stenosis of basilar arteries: Secondary | ICD-10-CM

## 2019-11-17 DIAGNOSIS — N183 Chronic kidney disease, stage 3 unspecified: Secondary | ICD-10-CM

## 2019-11-17 DIAGNOSIS — I129 Hypertensive chronic kidney disease with stage 1 through stage 4 chronic kidney disease, or unspecified chronic kidney disease: Secondary | ICD-10-CM

## 2019-11-17 LAB — COMPREHENSIVE METABOLIC PANEL
ALT: 12 U/L (ref 0–44)
AST: 13 U/L — ABNORMAL LOW (ref 15–41)
Albumin: 2 g/dL — ABNORMAL LOW (ref 3.5–5.0)
Alkaline Phosphatase: 80 U/L (ref 38–126)
Anion gap: 9 (ref 5–15)
BUN: 38 mg/dL — ABNORMAL HIGH (ref 6–20)
CO2: 22 mmol/L (ref 22–32)
Calcium: 8.2 mg/dL — ABNORMAL LOW (ref 8.9–10.3)
Chloride: 108 mmol/L (ref 98–111)
Creatinine, Ser: 4.8 mg/dL — ABNORMAL HIGH (ref 0.61–1.24)
GFR calc Af Amer: 15 mL/min — ABNORMAL LOW (ref >60)
GFR calc non Af Amer: 13 mL/min — ABNORMAL LOW (ref >60)
Glucose, Bld: 102 mg/dL — ABNORMAL HIGH (ref 70–99)
Potassium: 4.5 mmol/L (ref 3.5–5.1)
Sodium: 139 mmol/L (ref 135–145)
Total Bilirubin: 0.3 mg/dL (ref 0.3–1.2)
Total Protein: 5 g/dL — ABNORMAL LOW (ref 6.5–8.1)

## 2019-11-17 LAB — CBC
HCT: 30 % — ABNORMAL LOW (ref 39.0–52.0)
Hemoglobin: 9.6 g/dL — ABNORMAL LOW (ref 13.0–17.0)
MCH: 27.7 pg (ref 26.0–34.0)
MCHC: 32 g/dL (ref 30.0–36.0)
MCV: 86.5 fL (ref 80.0–100.0)
Platelets: 504 10*3/uL — ABNORMAL HIGH (ref 150–400)
RBC: 3.47 MIL/uL — ABNORMAL LOW (ref 4.22–5.81)
RDW: 15.7 % — ABNORMAL HIGH (ref 11.5–15.5)
WBC: 8.6 10*3/uL (ref 4.0–10.5)
nRBC: 0 % (ref 0.0–0.2)

## 2019-11-17 LAB — GLUCOSE, CAPILLARY
Glucose-Capillary: 72 mg/dL (ref 70–99)
Glucose-Capillary: 123 mg/dL — ABNORMAL HIGH (ref 70–99)
Glucose-Capillary: 101 mg/dL — ABNORMAL HIGH (ref 70–99)
Glucose-Capillary: 178 mg/dL — ABNORMAL HIGH (ref 70–99)

## 2019-11-17 LAB — MAGNESIUM: Magnesium: 2.2 mg/dL (ref 1.7–2.4)

## 2019-11-17 MED ORDER — TICAGRELOR 90 MG PO TABS
90.0000 mg | ORAL_TABLET | Freq: Two times a day (BID) | ORAL | Status: DC
  Administered 2019-11-18: 90 mg via ORAL
  Filled 2019-11-17: qty 1

## 2019-11-17 MED ORDER — ASPIRIN EC 81 MG PO TBEC
81.0000 mg | DELAYED_RELEASE_TABLET | Freq: Every day | ORAL | Status: DC
  Administered 2019-11-18: 09:00:00 81 mg via ORAL
  Filled 2019-11-17: qty 1

## 2019-11-17 MED ORDER — TICAGRELOR 90 MG PO TABS
180.0000 mg | ORAL_TABLET | Freq: Once | ORAL | Status: AC
  Administered 2019-11-17: 17:00:00 180 mg via ORAL
  Filled 2019-11-17: qty 2

## 2019-11-17 MED ORDER — INSULIN GLARGINE 100 UNIT/ML ~~LOC~~ SOLN
10.0000 [IU] | Freq: Every day | SUBCUTANEOUS | Status: DC
  Administered 2019-11-18: 09:00:00 10 [IU] via SUBCUTANEOUS
  Filled 2019-11-17: qty 0.1

## 2019-11-17 NOTE — Progress Notes (Signed)
Tele called about patient going in and out of what appears to be ST-elevation. EKG done which showed normal sinus rhythm with non-specific T wave abnormality. Patient asymptomatic and vitals stable. Attending paged, will continue to monitor.

## 2019-11-17 NOTE — Progress Notes (Signed)
SLP Cancellation Note  Patient Details Name: Phillip Camacho MRN: [id number] DOB: Mar 25, 1971   Cancelled treatment:        Attempted to see pt for cognitive-linguistic evaluation x3.  Pt out of room at 1118, 1143, and 1217.  Will reattempt as schedule permits.   Celedonio Savage, [place], [place] Office: [phone]  11/17/2019, 12:25 PM
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
## 2019-11-17 NOTE — Progress Notes (Signed)
Inpatient Diabetes Program Recommendations  AACE/ADA: New Consensus Statement on Inpatient Glycemic Control (2015)  Target Ranges:  Prepandial:   less than 140 mg/dL      Peak postprandial:   less than 180 mg/dL (1-2 hours)      Critically ill patients:  140 - 180 mg/dL   Lab Results  Component Value Date   GLUCAP 123 (H) 11/17/2019   HGBA1C 8.6 (H) 11/15/2019    Review of Glycemic Control Results for Phillip Camacho, Phillip Camacho (MRN [id number]) as of 11/17/2019 11:10  Ref. Range 11/16/2019 21:16 11/16/2019 21:50 11/17/2019 06:12 11/17/2019 11:04  Glucose-Capillary Latest Ref Range: 70 - 99 mg/dL 67 (L) 85 72 123 (H)   Diabetes history: Type 2 DM Outpatient Diabetes medications: Novolog 4 units TID, Lantus 12 units QD Current orders for Inpatient glycemic control: Lantus 12 units QD, Novolog 0-15 units TID and Novolog 0-5 units QHS  Inpatient Diabetes Program Recommendations:    Noted mild hypoglycemia of 67 mg/dL yesterday. May want to consider decreasing Lantus to 10 units QD.   Thanks, Bronson Curb, MSN, RNC-OB Diabetes Coordinator [phone] (8a-5p)

## 2019-11-17 NOTE — Consult Note (Signed)
MRA of chest without contrast ordered  Ruta Hinds, MD [facility] [place] [facility] Office: [phone]

## 2019-11-17 NOTE — Consult Note (Signed)
MRA reviewed. Although not the ideal study there is no obvious ulcerated lesion in the arch to explain his symptoms.  If it is decided he needs further eval he would need CTA of chest or arch aortogram with contrast.  Will let Neurology primary service nephrology weigh in on this.    Call if further questions otherwise medical therapy  Ruta Hinds, MD [facility] [place] Office: [phone]
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
## 2019-11-18 LAB — CBC
HCT: 32.6 % — ABNORMAL LOW (ref 39.0–52.0)
Hemoglobin: 10.3 g/dL — ABNORMAL LOW (ref 13.0–17.0)
MCH: 27.3 pg (ref 26.0–34.0)
MCHC: 31.6 g/dL (ref 30.0–36.0)
MCV: 86.5 fL (ref 80.0–100.0)
Platelets: 533 10*3/uL — ABNORMAL HIGH (ref 150–400)
RBC: 3.77 MIL/uL — ABNORMAL LOW (ref 4.22–5.81)
RDW: 15.6 % — ABNORMAL HIGH (ref 11.5–15.5)
WBC: 10.2 10*3/uL (ref 4.0–10.5)
nRBC: 0 % (ref 0.0–0.2)

## 2019-11-18 LAB — MAGNESIUM: Magnesium: 2.1 mg/dL (ref 1.7–2.4)

## 2019-11-18 LAB — GLUCOSE, CAPILLARY
Glucose-Capillary: 93 mg/dL (ref 70–99)
Glucose-Capillary: 91 mg/dL (ref 70–99)
Glucose-Capillary: 146 mg/dL — ABNORMAL HIGH (ref 70–99)

## 2019-11-18 MED ORDER — ASPIRIN 81 MG PO TBEC: 81.0000 mg | DELAYED_RELEASE_TABLET | Freq: Every day | ORAL | 5 refills | Status: DC

## 2019-11-18 MED ORDER — METOPROLOL TARTRATE 25 MG PO TABS: 25.0000 mg | ORAL_TABLET | Freq: Two times a day (BID) | ORAL | 11 refills | Status: DC

## 2019-11-18 MED ORDER — EZETIMIBE 10 MG PO TABS: 10.0000 mg | ORAL_TABLET | Freq: Every day | ORAL | 5 refills | Status: DC

## 2019-11-18 MED ORDER — ATORVASTATIN CALCIUM 80 MG PO TABS: 80.0000 mg | ORAL_TABLET | Freq: Every day | ORAL | 5 refills | Status: DC

## 2019-11-18 MED ORDER — TICAGRELOR 90 MG PO TABS: 90.0000 mg | ORAL_TABLET | Freq: Two times a day (BID) | ORAL | 5 refills | Status: DC

## 2019-11-18 MED ORDER — INSULIN GLARGINE 100 UNIT/ML SOLOSTAR PEN: 12.0000 [IU] | PEN_INJECTOR | Freq: Every day | SUBCUTANEOUS | 2 refills | Status: DC

## 2019-11-18 MED ORDER — INSULIN ASPART 100 UNIT/ML FLEXPEN: 4.0000 [IU] | PEN_INJECTOR | Freq: Three times a day (TID) | SUBCUTANEOUS | 2 refills | Status: DC
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
## 2019-11-18 NOTE — TOC Transition Note (Signed)
Transition of Care [facility]) - CM/SW Discharge Note   Patient Details  Name: Phillip Camacho MRN: [id number] Date of Birth: 1970/10/31  Transition of Care [facility]) CM/SW Contact:  Pollie Friar, RN Phone Number: 11/18/2019, 2:23 PM   Clinical Narrative:    Pt discharging home with outpatient therapy. CM spoke to pts son and [facility] selected. Orders in epic and information on the AVS.  CM was able to find through his insurance that his co pays for Brilinta will be $20/ month. Son aware and CM provided coupon card for the medication. Son to provide transport home.    Final next level of care: OP Rehab Barriers to Discharge: No Barriers Identified   Patient Goals and CMS Choice   CMS Medicare.gov Compare Post Acute Care list provided to:: Patient Represenative (must comment) Choice offered to / list presented to : Adult Children  Discharge Placement                       Discharge Plan and Services                                     Social Determinants of Health (SDOH) Interventions     Readmission Risk Interventions No flowsheet data found.
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
## 2019-11-18 NOTE — Plan of Care (Signed)
  Problem: Elimination: Goal: Will not experience complications related to bowel motility Outcome: Progressing   Problem: Safety: Goal: Ability to remain free from injury will improve Outcome: Progressing   

## 2019-11-19 ENCOUNTER — Telehealth: Payer: Self-pay

## 2019-11-19 NOTE — Telephone Encounter (Signed)
Transition Care Management Follow-up Telephone Call  Call completed with patient's son, [name].   Date of discharge and from where: 11/18/2019, [facility]   How have you been since you were released from the hospital? [name] said that his father still seems confused and thinks that he is still in the hospital.  He also noted that his father rolled off the bed last night, did not hit his head or sustain any injuries.  [name] thinks he just misjudged his position on the bed and rolled too far. He also noted that his speech is still hard to understand at times. Reviewed when to return to ED  Any questions or concerns? Concern noted above  Items Reviewed:  Did the pt receive and understand the discharge instructions provided? His son said that he has the instructions and did not have any questions. He had just arrived home at the time of this call   Medications obtained and verified? [name] said that his father has all medications including the new ones and he has the medication list and he did not have any medications at this time. He said that he understands what his father is to take. Reminded him to review the list as there are medications that he is to stop taking in addition to the medications that are new. Instructed him to call this clinic with any questions after he reviews it. [name] said that he sets up the meds for the patient and will ask him if he took his medication as a remindeder. [name] also checks to make sure that his father actually took the medications that were set up for him.  He said that his father is able to administer the insulin independently.    Any new allergies since your discharge?   None reported  Do you have support at home? He is currently living with his son, [name] who works during the day.  He said that he has friends check on his father while he is at work.   Other (ie: DME, Home Health, etc) no home health ordered  Has glucometer. [name] said that they just  got batteries and his father is able to use the meter.  He said that he keeps a log.    Functional Questionnaire: (I = Independent and D = Dependent) ADL's: independent, family provides assist as needed.  No assistive device for ambulation.    Follow up appointments reviewed:    PCP Hospital f/u appt confirmed? Appointment scheduled with Dr Chapman Fitch - 11/27/2019 @ [facility] f/u appt confirmed? Appointment needs to be scheduled with neurology.    Are transportation arrangements needed? No, his son stated that someone would be able to drive him to the clinic appts  If their condition worsens, is the pt aware to call  their PCP or go to the ED?  yes  Was the patient provided with contact information for the PCP's office or ED? His son has the phone number for the clinic  Was the pt encouraged to call back with questions or concerns?yes

## 2019-11-20 ENCOUNTER — Ambulatory Visit (INDEPENDENT_AMBULATORY_CARE_PROVIDER_SITE_OTHER): Payer: 59 | Admitting: *Deleted

## 2019-11-20 DIAGNOSIS — I639 Cerebral infarction, unspecified: Secondary | ICD-10-CM

## 2019-11-20 LAB — CUP PACEART REMOTE DEVICE CHECK
Date Time Interrogation Session: 20210127230157
Implantable Pulse Generator Implant Date: 20201021

## 2019-11-20 NOTE — Progress Notes (Signed)
ILR Remote 

## 2019-11-21 ENCOUNTER — Other Ambulatory Visit: Payer: Self-pay | Admitting: *Deleted

## 2019-11-21 ENCOUNTER — Telehealth: Payer: Self-pay

## 2019-11-21 NOTE — Patient Outreach (Signed)
Phillip Camacho) Care Management  11/21/2019  Phillip Camacho Nov 01, 1970 [id number]   Care coordination- Call from Daughter   [facility] RN CM received a call from Daughter Phillip Camacho She was able to verify HIPAA (date of birth (DOB) and address)  Phillip Camacho reports attempts to reach Phillip Guitar, RN CM at [facility]) without success She voices concern with Phillip Camacho's home care and unclear updates on Phillip Camacho's health from her brother Phillip Camacho verified [facility] RN CM's  Office number with Phillip Camacho [facility] RN CM dialed the number and confirmed no answer and no ability to leave a voice message. [facility] RN CM sent an email to Phillip Camacho to return a call to Phillip Camacho also provided [facility]'s main number [facility] RN Cm will collaborate with [facility] RN CM prn    Phillip Millin L. Lavina Hamman, RN, BSN, [place] Coordinator Office number [phone] Mobile number [phone]  Main [facility] number [phone] Fax number [phone]

## 2019-11-21 NOTE — Telephone Encounter (Signed)
Message received from Joellyn Quails, RN CM/[facility]  noting that patient's daughter,Ashley would like to speak to this CM.    Call returned to daughter, [name], [phone]. She lives in [place] and is in the [employer] and going to school.   She has concerns about the care that her brother is providing for her father and would like to have more support in place for him.   She said that he has been denied disability and medicaid and she is not sure how her father is paying his insurance premiums.     He has insurance - instructed her to contact his insurance company to determine what type of coverage he has. Informed her that he is not eligible for the [employer] as long as he has insurance. The [employer] will not provide additional support in the home.  Encouraged her to contact her brother to discuss her concerns and obtain additional information from about the insurance plan if needed.  She was in agreement to having a referral placed to [facility] to guide her with next steps regarding the current insurance plan and appealing the medicaid and disability decisions.   Update provided to Joellyn Quails, RN CM

## 2019-11-24 NOTE — Telephone Encounter (Signed)
Referral faxed to [employer] [facility]

## 2019-11-27 ENCOUNTER — Ambulatory Visit: Payer: 59 | Admitting: Family Medicine

## 2019-12-21 ENCOUNTER — Ambulatory Visit (INDEPENDENT_AMBULATORY_CARE_PROVIDER_SITE_OTHER): Payer: 59 | Admitting: *Deleted

## 2019-12-21 DIAGNOSIS — I639 Cerebral infarction, unspecified: Secondary | ICD-10-CM | POA: Diagnosis not present

## 2019-12-21 LAB — CUP PACEART REMOTE DEVICE CHECK
Date Time Interrogation Session: 20210227230034
Implantable Pulse Generator Implant Date: 20201021

## 2019-12-21 NOTE — Progress Notes (Signed)
ILR remote 

## 2019-12-31 ENCOUNTER — Emergency Department (HOSPITAL_COMMUNITY): Payer: 59 | Admitting: Certified Registered Nurse Anesthetist

## 2019-12-31 ENCOUNTER — Inpatient Hospital Stay (HOSPITAL_COMMUNITY)
Admission: EM | Admit: 2019-12-31 | Discharge: 2020-01-14 | DRG: 628 | Disposition: A | Discharge status: Hospice | Payer: 59 | Attending: Internal Medicine | Admitting: Internal Medicine

## 2019-12-31 ENCOUNTER — Encounter (HOSPITAL_COMMUNITY): Payer: Self-pay | Admitting: Family Medicine

## 2019-12-31 ENCOUNTER — Encounter (HOSPITAL_COMMUNITY)
Admission: EM | Disposition: A | Discharge status: Hospice | Payer: Self-pay | Source: Home / Self Care | Attending: Internal Medicine

## 2019-12-31 DIAGNOSIS — E875 Hyperkalemia: Secondary | ICD-10-CM | POA: Diagnosis present

## 2019-12-31 DIAGNOSIS — Z7982 Long term (current) use of aspirin: Secondary | ICD-10-CM

## 2019-12-31 DIAGNOSIS — N185 Chronic kidney disease, stage 5: Secondary | ICD-10-CM | POA: Diagnosis not present

## 2019-12-31 DIAGNOSIS — H5440 Blindness, one eye, unspecified eye: Secondary | ICD-10-CM | POA: Diagnosis present

## 2019-12-31 DIAGNOSIS — E1122 Type 2 diabetes mellitus with diabetic chronic kidney disease: Secondary | ICD-10-CM | POA: Diagnosis present

## 2019-12-31 DIAGNOSIS — G9341 Metabolic encephalopathy: Secondary | ICD-10-CM | POA: Diagnosis not present

## 2019-12-31 DIAGNOSIS — I998 Other disorder of circulatory system: Secondary | ICD-10-CM | POA: Diagnosis present

## 2019-12-31 DIAGNOSIS — L89152 Pressure ulcer of sacral region, stage 2: Secondary | ICD-10-CM | POA: Diagnosis not present

## 2019-12-31 DIAGNOSIS — I120 Hypertensive chronic kidney disease with stage 5 chronic kidney disease or end stage renal disease: Secondary | ICD-10-CM | POA: Diagnosis present

## 2019-12-31 DIAGNOSIS — L89610 Pressure ulcer of right heel, unstageable: Secondary | ICD-10-CM | POA: Diagnosis not present

## 2019-12-31 DIAGNOSIS — R64 Cachexia: Secondary | ICD-10-CM | POA: Diagnosis present

## 2019-12-31 DIAGNOSIS — N186 End stage renal disease: Secondary | ICD-10-CM | POA: Diagnosis present

## 2019-12-31 DIAGNOSIS — R627 Adult failure to thrive: Secondary | ICD-10-CM | POA: Diagnosis present

## 2019-12-31 DIAGNOSIS — I639 Cerebral infarction, unspecified: Secondary | ICD-10-CM | POA: Diagnosis not present

## 2019-12-31 DIAGNOSIS — Z794 Long term (current) use of insulin: Secondary | ICD-10-CM

## 2019-12-31 DIAGNOSIS — I672 Cerebral atherosclerosis: Secondary | ICD-10-CM | POA: Diagnosis present

## 2019-12-31 DIAGNOSIS — I70229 Atherosclerosis of native arteries of extremities with rest pain, unspecified extremity: Secondary | ICD-10-CM | POA: Diagnosis not present

## 2019-12-31 DIAGNOSIS — M79605 Pain in left leg: Secondary | ICD-10-CM | POA: Diagnosis not present

## 2019-12-31 DIAGNOSIS — D631 Anemia in chronic kidney disease: Secondary | ICD-10-CM | POA: Diagnosis present

## 2019-12-31 DIAGNOSIS — F17200 Nicotine dependence, unspecified, uncomplicated: Secondary | ICD-10-CM | POA: Diagnosis present

## 2019-12-31 DIAGNOSIS — E119 Type 2 diabetes mellitus without complications: Secondary | ICD-10-CM | POA: Diagnosis not present

## 2019-12-31 DIAGNOSIS — N19 Unspecified kidney failure: Secondary | ICD-10-CM

## 2019-12-31 DIAGNOSIS — Z79899 Other long term (current) drug therapy: Secondary | ICD-10-CM

## 2019-12-31 DIAGNOSIS — I70262 Atherosclerosis of native arteries of extremities with gangrene, left leg: Secondary | ICD-10-CM | POA: Diagnosis not present

## 2019-12-31 DIAGNOSIS — N184 Chronic kidney disease, stage 4 (severe): Secondary | ICD-10-CM

## 2019-12-31 DIAGNOSIS — R29810 Facial weakness: Secondary | ICD-10-CM | POA: Diagnosis not present

## 2019-12-31 DIAGNOSIS — I69354 Hemiplegia and hemiparesis following cerebral infarction affecting left non-dominant side: Secondary | ICD-10-CM

## 2019-12-31 DIAGNOSIS — Z515 Encounter for palliative care: Secondary | ICD-10-CM | POA: Diagnosis not present

## 2019-12-31 DIAGNOSIS — R6520 Severe sepsis without septic shock: Secondary | ICD-10-CM | POA: Diagnosis not present

## 2019-12-31 DIAGNOSIS — G9349 Other encephalopathy: Secondary | ICD-10-CM | POA: Diagnosis not present

## 2019-12-31 DIAGNOSIS — I6340 Cerebral infarction due to embolism of unspecified cerebral artery: Secondary | ICD-10-CM | POA: Diagnosis not present

## 2019-12-31 DIAGNOSIS — R509 Fever, unspecified: Secondary | ICD-10-CM | POA: Diagnosis not present

## 2019-12-31 DIAGNOSIS — I10 Essential (primary) hypertension: Secondary | ICD-10-CM

## 2019-12-31 DIAGNOSIS — E785 Hyperlipidemia, unspecified: Secondary | ICD-10-CM | POA: Diagnosis present

## 2019-12-31 DIAGNOSIS — R4189 Other symptoms and signs involving cognitive functions and awareness: Secondary | ICD-10-CM | POA: Diagnosis present

## 2019-12-31 DIAGNOSIS — Z66 Do not resuscitate: Secondary | ICD-10-CM | POA: Diagnosis not present

## 2019-12-31 DIAGNOSIS — R4182 Altered mental status, unspecified: Secondary | ICD-10-CM | POA: Diagnosis not present

## 2019-12-31 DIAGNOSIS — N2581 Secondary hyperparathyroidism of renal origin: Secondary | ICD-10-CM | POA: Diagnosis not present

## 2019-12-31 DIAGNOSIS — R590 Localized enlarged lymph nodes: Secondary | ICD-10-CM

## 2019-12-31 DIAGNOSIS — N179 Acute kidney failure, unspecified: Secondary | ICD-10-CM | POA: Diagnosis present

## 2019-12-31 DIAGNOSIS — Z20822 Contact with and (suspected) exposure to covid-19: Secondary | ICD-10-CM | POA: Diagnosis present

## 2019-12-31 DIAGNOSIS — I69398 Other sequelae of cerebral infarction: Secondary | ICD-10-CM

## 2019-12-31 DIAGNOSIS — A419 Sepsis, unspecified organism: Secondary | ICD-10-CM | POA: Diagnosis not present

## 2019-12-31 DIAGNOSIS — Z992 Dependence on renal dialysis: Secondary | ICD-10-CM

## 2019-12-31 DIAGNOSIS — Z8673 Personal history of transient ischemic attack (TIA), and cerebral infarction without residual deficits: Secondary | ICD-10-CM | POA: Diagnosis not present

## 2019-12-31 DIAGNOSIS — G7000 Myasthenia gravis without (acute) exacerbation: Secondary | ICD-10-CM | POA: Diagnosis not present

## 2019-12-31 DIAGNOSIS — I959 Hypotension, unspecified: Secondary | ICD-10-CM | POA: Diagnosis not present

## 2019-12-31 DIAGNOSIS — Z681 Body mass index (BMI) 19 or less, adult: Secondary | ICD-10-CM | POA: Diagnosis not present

## 2019-12-31 DIAGNOSIS — Z7189 Other specified counseling: Secondary | ICD-10-CM | POA: Diagnosis not present

## 2019-12-31 DIAGNOSIS — L8990 Pressure ulcer of unspecified site, unspecified stage: Secondary | ICD-10-CM | POA: Insufficient documentation

## 2019-12-31 DIAGNOSIS — Z7902 Long term (current) use of antithrombotics/antiplatelets: Secondary | ICD-10-CM

## 2019-12-31 DIAGNOSIS — E1152 Type 2 diabetes mellitus with diabetic peripheral angiopathy with gangrene: Secondary | ICD-10-CM | POA: Diagnosis not present

## 2019-12-31 DIAGNOSIS — Z419 Encounter for procedure for purposes other than remedying health state, unspecified: Secondary | ICD-10-CM

## 2019-12-31 DIAGNOSIS — E43 Unspecified severe protein-calorie malnutrition: Secondary | ICD-10-CM | POA: Diagnosis present

## 2019-12-31 DIAGNOSIS — I739 Peripheral vascular disease, unspecified: Secondary | ICD-10-CM

## 2019-12-31 DIAGNOSIS — I6389 Other cerebral infarction: Secondary | ICD-10-CM | POA: Diagnosis not present

## 2019-12-31 DIAGNOSIS — R29711 NIHSS score 11: Secondary | ICD-10-CM | POA: Diagnosis not present

## 2019-12-31 DIAGNOSIS — Z751 Person awaiting admission to adequate facility elsewhere: Secondary | ICD-10-CM

## 2019-12-31 DIAGNOSIS — L8995 Pressure ulcer of unspecified site, unstageable: Secondary | ICD-10-CM | POA: Diagnosis not present

## 2019-12-31 HISTORY — DX: Hyperlipidemia, unspecified: E78.5

## 2019-12-31 HISTORY — DX: Peripheral vascular disease, unspecified: I73.9

## 2019-12-31 LAB — CBC WITH DIFFERENTIAL/PLATELET
Abs Immature Granulocytes: 0.06 10*3/uL (ref 0.00–0.07)
Basophils Absolute: 0.1 10*3/uL (ref 0.0–0.1)
Basophils Relative: 1 %
Eosinophils Absolute: 0.7 10*3/uL — ABNORMAL HIGH (ref 0.0–0.5)
Eosinophils Relative: 5 %
HCT: 35 % — ABNORMAL LOW (ref 39.0–52.0)
Hemoglobin: 10.6 g/dL — ABNORMAL LOW (ref 13.0–17.0)
Immature Granulocytes: 0 %
Lymphocytes Relative: 17 %
Lymphs Abs: 2.4 10*3/uL (ref 0.7–4.0)
MCH: 27.7 pg (ref 26.0–34.0)
MCHC: 30.3 g/dL (ref 30.0–36.0)
MCV: 91.6 fL (ref 80.0–100.0)
Monocytes Absolute: 0.9 10*3/uL (ref 0.1–1.0)
Monocytes Relative: 6 %
Neutro Abs: 10.4 10*3/uL — ABNORMAL HIGH (ref 1.7–7.7)
Neutrophils Relative %: 71 %
Platelets: 748 10*3/uL — ABNORMAL HIGH (ref 150–400)
RBC: 3.82 MIL/uL — ABNORMAL LOW (ref 4.22–5.81)
RDW: 15.5 % (ref 11.5–15.5)
WBC: 14.5 10*3/uL — ABNORMAL HIGH (ref 4.0–10.5)
nRBC: 0 % (ref 0.0–0.2)

## 2019-12-31 LAB — BASIC METABOLIC PANEL
Anion gap: 12 (ref 5–15)
BUN: 50 mg/dL — ABNORMAL HIGH (ref 6–20)
CO2: 17 mmol/L — ABNORMAL LOW (ref 22–32)
Calcium: 8.9 mg/dL (ref 8.9–10.3)
Chloride: 109 mmol/L (ref 98–111)
Creatinine, Ser: 5.69 mg/dL — ABNORMAL HIGH (ref 0.61–1.24)
GFR calc Af Amer: 13 mL/min — ABNORMAL LOW (ref >60)
GFR calc non Af Amer: 11 mL/min — ABNORMAL LOW (ref >60)
Glucose, Bld: 119 mg/dL — ABNORMAL HIGH (ref 70–99)
Potassium: 6.6 mmol/L (ref 3.5–5.1)
Sodium: 138 mmol/L (ref 135–145)

## 2019-12-31 LAB — RESPIRATORY PANEL BY RT PCR (FLU A&B, COVID)
Influenza A by PCR: NEGATIVE
Influenza B by PCR: NEGATIVE
SARS Coronavirus 2 by RT PCR: NEGATIVE

## 2019-12-31 LAB — ABO/RH: ABO/RH(D): O POS

## 2019-12-31 LAB — PROTIME-INR
INR: 1 (ref 0.8–1.2)
Prothrombin Time: 12.7 seconds (ref 11.4–15.2)

## 2019-12-31 LAB — POTASSIUM: Potassium: 6.4 mmol/L (ref 3.5–5.1)

## 2019-12-31 LAB — APTT: aPTT: 37 seconds — ABNORMAL HIGH (ref 24–36)

## 2019-12-31 SURGERY — BYPASS GRAFT FEMORAL-POPLITEAL ARTERY
Anesthesia: General

## 2019-12-31 MED ORDER — MIDAZOLAM HCL 2 MG/2ML IJ SOLN
INTRAMUSCULAR | Status: AC
  Filled 2019-12-31: qty 2

## 2019-12-31 MED ORDER — HEPARIN SODIUM (PORCINE) 5000 UNIT/ML IJ SOLN
5000.0000 [IU] | Freq: Three times a day (TID) | INTRAMUSCULAR | Status: DC
  Administered 2020-01-01 – 2020-01-12 (×32): 5000 [IU] via SUBCUTANEOUS
  Filled 2019-12-31 (×33): qty 1

## 2019-12-31 MED ORDER — SODIUM CHLORIDE 0.9 % IV SOLN: INTRAVENOUS | Status: AC

## 2019-12-31 MED ORDER — INSULIN ASPART 100 UNIT/ML IV SOLN
5.0000 [IU] | Freq: Once | INTRAVENOUS | Status: AC
  Administered 2019-12-31: 5 [IU] via INTRAVENOUS

## 2019-12-31 MED ORDER — ASPIRIN EC 81 MG PO TBEC
81.0000 mg | DELAYED_RELEASE_TABLET | Freq: Every day | ORAL | Status: DC
  Administered 2020-01-01 – 2020-01-06 (×5): 81 mg via ORAL
  Filled 2019-12-31 (×7): qty 1

## 2019-12-31 MED ORDER — SODIUM CHLORIDE 0.9 % IV SOLN
INTRAVENOUS | Status: DC | PRN
  Administered 2019-12-31 – 2020-01-01 (×2): 500 mL

## 2019-12-31 MED ORDER — ATORVASTATIN CALCIUM 80 MG PO TABS
80.0000 mg | ORAL_TABLET | Freq: Every day | ORAL | Status: DC
  Administered 2020-01-01 – 2020-01-06 (×5): 80 mg via ORAL
  Filled 2019-12-31 (×6): qty 1

## 2019-12-31 MED ORDER — ACETAMINOPHEN 650 MG RE SUPP: 650.0000 mg | Freq: Four times a day (QID) | RECTAL | Status: DC | PRN

## 2019-12-31 MED ORDER — ONDANSETRON HCL 4 MG PO TABS: 4.0000 mg | ORAL_TABLET | Freq: Four times a day (QID) | ORAL | Status: DC | PRN

## 2019-12-31 MED ORDER — SODIUM CHLORIDE 0.9% FLUSH
3.0000 mL | Freq: Two times a day (BID) | INTRAVENOUS | Status: DC
  Administered 2020-01-01 – 2020-01-12 (×17): 3 mL via INTRAVENOUS

## 2019-12-31 MED ORDER — ONDANSETRON HCL 4 MG/2ML IJ SOLN
4.0000 mg | Freq: Once | INTRAMUSCULAR | Status: AC
  Administered 2019-12-31: 4 mg via INTRAVENOUS
  Filled 2019-12-31: qty 2

## 2019-12-31 MED ORDER — SODIUM BICARBONATE 8.4 % IV SOLN
50.0000 meq | Freq: Once | INTRAVENOUS | Status: AC
  Administered 2019-12-31: 19:00:00 50 meq via INTRAVENOUS
  Filled 2019-12-31: qty 50

## 2019-12-31 MED ORDER — FENTANYL CITRATE (PF) 100 MCG/2ML IJ SOLN
25.0000 ug | INTRAMUSCULAR | Status: DC | PRN
  Administered 2020-01-01: 05:00:00 25 ug via INTRAVENOUS
  Filled 2019-12-31: qty 2

## 2019-12-31 MED ORDER — SODIUM BICARBONATE 650 MG PO TABS
1300.0000 mg | ORAL_TABLET | Freq: Two times a day (BID) | ORAL | Status: DC
  Administered 2020-01-01 – 2020-01-02 (×4): 1300 mg via ORAL
  Filled 2019-12-31 (×4): qty 2

## 2019-12-31 MED ORDER — SODIUM ZIRCONIUM CYCLOSILICATE 10 G PO PACK
10.0000 g | PACK | Freq: Every day | ORAL | Status: DC
  Administered 2020-01-01 – 2020-01-02 (×2): 10 g via ORAL
  Filled 2019-12-31 (×2): qty 1

## 2019-12-31 MED ORDER — MORPHINE SULFATE (PF) 4 MG/ML IV SOLN
4.0000 mg | Freq: Once | INTRAVENOUS | Status: AC
  Administered 2019-12-31: 4 mg via INTRAVENOUS
  Filled 2019-12-31: qty 1

## 2019-12-31 MED ORDER — TICAGRELOR 90 MG PO TABS
90.0000 mg | ORAL_TABLET | Freq: Two times a day (BID) | ORAL | Status: DC
  Administered 2020-01-01 – 2020-01-08 (×11): 90 mg via ORAL
  Filled 2019-12-31 (×13): qty 1

## 2019-12-31 MED ORDER — DEXTROSE 50 % IV SOLN
1.0000 | Freq: Once | INTRAVENOUS | Status: AC
  Administered 2019-12-31: 19:00:00 50 mL via INTRAVENOUS
  Filled 2019-12-31: qty 50

## 2019-12-31 MED ORDER — INSULIN ASPART 100 UNIT/ML ~~LOC~~ SOLN
0.0000 [IU] | SUBCUTANEOUS | Status: DC
  Administered 2020-01-01: 22:00:00 1 [IU] via SUBCUTANEOUS
  Administered 2020-01-01: 17:00:00 2 [IU] via SUBCUTANEOUS
  Administered 2020-01-02 – 2020-01-04 (×3): 1 [IU] via SUBCUTANEOUS
  Administered 2020-01-04: 2 [IU] via SUBCUTANEOUS
  Administered 2020-01-06 – 2020-01-07 (×5): 1 [IU] via SUBCUTANEOUS
  Administered 2020-01-08 (×3): 2 [IU] via SUBCUTANEOUS
  Administered 2020-01-08 – 2020-01-09 (×3): 1 [IU] via SUBCUTANEOUS
  Administered 2020-01-09: 2 [IU] via SUBCUTANEOUS
  Administered 2020-01-09: 19:00:00 1 [IU] via SUBCUTANEOUS
  Administered 2020-01-09: 2 [IU] via SUBCUTANEOUS
  Administered 2020-01-09: 1 [IU] via SUBCUTANEOUS
  Administered 2020-01-10: 18:00:00 2 [IU] via SUBCUTANEOUS
  Administered 2020-01-10 (×2): 3 [IU] via SUBCUTANEOUS
  Administered 2020-01-10: 01:00:00 2 [IU] via SUBCUTANEOUS
  Administered 2020-01-10 (×2): 1 [IU] via SUBCUTANEOUS
  Administered 2020-01-10: 2 [IU] via SUBCUTANEOUS
  Administered 2020-01-11 – 2020-01-12 (×5): 1 [IU] via SUBCUTANEOUS

## 2019-12-31 MED ORDER — FUROSEMIDE 10 MG/ML IJ SOLN
40.0000 mg | Freq: Once | INTRAMUSCULAR | Status: AC
  Administered 2020-01-01: 40 mg via INTRAVENOUS
  Filled 2019-12-31: qty 4

## 2019-12-31 MED ORDER — PROPOFOL 10 MG/ML IV BOLUS
INTRAVENOUS | Status: AC
  Filled 2019-12-31: qty 40

## 2019-12-31 MED ORDER — 0.9 % SODIUM CHLORIDE (POUR BTL) OPTIME
TOPICAL | Status: DC | PRN
  Administered 2019-12-31 – 2020-01-01 (×2): 2000 mL

## 2019-12-31 MED ORDER — SODIUM CHLORIDE 0.9 % IV SOLN
INTRAVENOUS | Status: AC
  Filled 2019-12-31: qty 1.2

## 2019-12-31 MED ORDER — SODIUM CHLORIDE 0.9 % IV BOLUS
500.0000 mL | Freq: Once | INTRAVENOUS | Status: AC
  Administered 2020-01-01: 500 mL via INTRAVENOUS

## 2019-12-31 MED ORDER — CEFAZOLIN SODIUM-DEXTROSE 2-4 GM/100ML-% IV SOLN
2.0000 g | INTRAVENOUS | Status: AC
  Filled 2019-12-31: qty 100

## 2019-12-31 MED ORDER — FENTANYL CITRATE (PF) 250 MCG/5ML IJ SOLN
INTRAMUSCULAR | Status: AC
  Filled 2019-12-31: qty 5

## 2019-12-31 MED ORDER — SODIUM ZIRCONIUM CYCLOSILICATE 10 G PO PACK
10.0000 g | PACK | Freq: Once | ORAL | Status: AC
  Administered 2019-12-31: 10 g via ORAL
  Filled 2019-12-31: qty 1

## 2019-12-31 MED ORDER — ACETAMINOPHEN 325 MG PO TABS
650.0000 mg | ORAL_TABLET | Freq: Four times a day (QID) | ORAL | Status: DC | PRN
  Administered 2020-01-06: 08:00:00 650 mg via ORAL
  Filled 2019-12-31 (×2): qty 2

## 2019-12-31 MED ORDER — ONDANSETRON HCL 4 MG/2ML IJ SOLN: 4.0000 mg | Freq: Four times a day (QID) | INTRAMUSCULAR | Status: DC | PRN

## 2019-12-31 MED ORDER — AMLODIPINE BESYLATE 10 MG PO TABS
10.0000 mg | ORAL_TABLET | Freq: Every day | ORAL | Status: DC
  Administered 2020-01-01 – 2020-01-06 (×6): 10 mg via ORAL
  Filled 2019-12-31 (×6): qty 1

## 2019-12-31 NOTE — ED Notes (Signed)
Pt removed IV. Bleeding controlled.

## 2019-12-31 NOTE — Progress Notes (Signed)
Patient ID: Phillip Camacho, male   DOB: 01/08/1971, 49 y.o.   MRN: [id number] Patient is found to have potassium of 6.5 and creatinine of 5.5. Does have severe ischemia of his foot but this is been going on for several weeks. [facility] have been consulted and will admit for preoperative optimization of potassium.  Is tentatively scheduled for surgery tomorrow for left leg revascularization.  High risk for limb loss.  Also high risk for progression to end-stage renal disease.

## 2019-12-31 NOTE — Progress Notes (Signed)
Lab came to get patients blood, patient refused. I got the interpreter to make sure that the patient was understanding was being done. He told the interpreter no, he just wants to sleep. I explained this could cause a delay in care and he stated no, he wants to sleep. Will continue to monitor.   Tawanna Sat, RN 12/31/2019

## 2019-12-31 NOTE — ED Provider Notes (Signed)
Phillip Camacho is a 49 y.o. male, presenting to the ED with left lower extremity pain for the last 2 days.  His pain is aching, severe, radiating from the foot into the calf.   HPI from [place], PA-C: "Phillip Camacho is a 49 y.o. male with a history of CKD stage IV, central retinal arterial occlusion, diabetes mellitus type 2, CVA on 11/14/2019 who presents to the Emergency Department complaining of worsening left foot and ankle pain x 2 days.  Per EMS, his son states that since his last CVA he has experienced weakness in his left lower extremity but notes this has worsened during the past week.  Patient speaks little English but notes that he has worsening pain in his left medial ankle and dorsal foot which has been ongoing the past two days.  He notes associated color change and decreased warmth. He denies any CP, HA or SOB at this time."  Past Medical History:  Diagnosis Date  . Diabetes mellitus without complication ([place])   . Stroke [facility])       Physical Exam  BP (!) 174/78 (BP Location: Left Arm)   Pulse 100   Temp 98.2 F (36.8 C) (Oral)   Resp 18   SpO2 100%   Physical Exam Vitals and nursing note reviewed.  Constitutional:      General: He is not in acute distress.    Appearance: He is well-developed. He is not diaphoretic.  HENT:     Head: Normocephalic and atraumatic.     Mouth/Throat:     Mouth: Mucous membranes are moist.     Pharynx: Oropharynx is clear.  Eyes:     Conjunctiva/sclera: Conjunctivae normal.  Cardiovascular:     Rate and Rhythm: Normal rate and regular rhythm.     Pulses:          Radial pulses are 2+ on the right side and 2+ on the left side.       Femoral pulses are 2+ on the left side.      Popliteal pulses are 1+ on the left side.       Dorsalis pedis pulses are 2+ on the right side and 0 on the left side.       Posterior tibial pulses are 2+ on the right side and 0 on the left side.     Heart sounds: Normal heart sounds.     Comments:  Pulse was dopplerable to the left popliteal. Pulmonary:     Effort: Pulmonary effort is normal. No respiratory distress.     Breath sounds: Normal breath sounds.  Abdominal:     Palpations: Abdomen is soft.     Tenderness: There is no abdominal tenderness. There is no guarding.  Musculoskeletal:     Cervical back: Neck supple.     Right lower leg: No edema.     Left lower leg: No edema.     Comments: Tenderness and coolness to the left lower extremity from about mid calf through the foot.  There appear to be ulcerations to the left lower extremity as well, though none appear acute.  Lymphadenopathy:     Cervical: No cervical adenopathy.  Skin:    General: Skin is warm and dry.     Capillary Refill: Capillary refill takes more than 3 seconds.  Neurological:     Mental Status: He is alert.     Comments: Patient appears to have adequate strength in the bilateral lower extremities.  He is able  to lift both legs off of the bed.  He is able to perform plantar and dorsiflexion at the left ankle, though this is painful for him.  Psychiatric:        Mood and Affect: Mood and affect normal.        Speech: Speech normal.        Behavior: Behavior normal.                      ED Course/Procedures     .Critical Care Performed by: Lorayne Bender, PA-C Authorized by: Lorayne Bender, PA-C   Critical care provider statement:    Critical care time (minutes):  35   Critical care time was exclusive of:  Separately billable procedures and treating other patients   Critical care was necessary to treat or prevent imminent or life-threatening deterioration of the following conditions:  Circulatory failure   Critical care was time spent personally by me on the following activities:  Development of treatment plan with patient or surrogate, discussions with consultants, ordering and review of laboratory studies, ordering and review of radiographic studies, ordering and performing treatments  and interventions, re-evaluation of patient's condition, evaluation of patient's response to treatment, examination of patient and obtaining history from patient or surrogate   I assumed direction of critical care for this patient from another provider in my specialty: yes   .1-3 Lead EKG Interpretation Performed by: Lorayne Bender, PA-C Authorized by: Lorayne Bender, PA-C     Interpretation: normal     ECG rate:  104   ECG rate assessment: tachycardic     Rhythm: sinus tachycardia     Ectopy: none     Conduction: normal     Abnormal Labs Reviewed  CBC WITH DIFFERENTIAL/PLATELET - Abnormal; Notable for the following components:      Result Value   WBC 14.5 (*)    RBC 3.82 (*)    Hemoglobin 10.6 (*)    HCT 35.0 (*)    Platelets 748 (*)    Neutro Abs 10.4 (*)    Eosinophils Absolute 0.7 (*)    All other components within normal limits  APTT - Abnormal; Notable for the following components:   aPTT 37 (*)    All other components within normal limits  BASIC METABOLIC PANEL - Abnormal; Notable for the following components:   Potassium 6.6 (*)    CO2 17 (*)    Glucose, Bld 119 (*)    BUN 50 (*)    Creatinine, Ser 5.69 (*)    GFR calc non Af Amer 11 (*)    GFR calc Af Amer 13 (*)    All other components within normal limits     EKG Interpretation  Date/Time:  Wednesday December 31 2019 15:28:43 EST Ventricular Rate:  99 PR Interval:    QRS Duration: 82 QT Interval:  355 QTC Calculation: 456 R Axis:   68 Text Interpretation: Sinus rhythm LVH with secondary repolarization abnormality since last tracing no significant change Confirmed by Noemi Chapel [phone]) on 12/31/2019 4:01:29 PM      MDM   Clinical Course as of Dec 31 2007  Wed Dec 31, 2019  1635 Spoke with [place], [place] for vascular surgery.  He assessed the patient, requested 2 hour Covid. He will speak with Dr. Donnetta Hutching for next steps.   [SJ]  69 Dr. Donnetta Hutching will plan on taking patient to OR once COVID test results. Keep  patient NPO. They will  plan on admitting the patient to the Vascular service.   [SJ]  1723 Some hemolysis noted. We will redraw a potassium. EKG with some prominent T waves in V3 and V4, but otherwise reassuring.  Potassium(!!): 6.6 [SJ]  26 Spoke with Dr. Marval Regal, nephrologist.  We discussed the patient's presenting complaint, lab findings, EKG, and plan for vascular surgery. He agrees with plan for retesting potassium.  If it is still elevated, initiate the hyperkalemia treatment including Lokelma, bicarb, insulin, dextrose.  They will consult on the patient and plan to see him after surgery.   [SJ]  1945 Spoke with the patient's son, [name] [phone]), at the patient's request. We discussed the patient's presentation here in the ED as well as the plan for vascular surgery.  He asked some questions regarding the surgery and I told him I would let the surgeon know that he had some questions.   [SJ]    Clinical Course User Index [SJ] Lorayne Bender, PA-C    Patient care handoff report received from [facility], PA-C. Plan: Vascular surgery to see and evaluate the patient.  Patient presents with left lower extremity pain for the last couple days.  He has no discernible pulses in the foot and the extremity is cool to the touch.  Reviewed the patient's record to obtain more information.  It seems as though following his most recent admission last fall, he was told to follow-up with nephrology due to creatinine elevation, but was unable to do so due to concerns regarding insurance coverage   Vitals:   12/31/19 1630 12/31/19 1645 12/31/19 1700 12/31/19 1730  BP: (!) 189/84 (!) 146/76 (!) 132/109 (!) 163/68  Pulse:    94  Resp: 17 10 (!) 23 16  Temp:      TempSrc:      SpO2:    98%       Layla Maw 12/31/19 2009    Carmin Muskrat, MD 01/01/20 [phone]

## 2019-12-31 NOTE — ED Provider Notes (Signed)
This patient is a 49 year old male presenting with pain in his left leg, he has decreased temperature, it is cold, there is no palpable pulses and no dopplerable pulses that I can find at the dorsalis pedis or posterior tibial arteries. He does have some signs of some necrosis of his toes.  He has had this for at least 48 hours. His vital signs reflect hypertension but no fever.  Vascular was consulted and will come to see the patient.  Medical screening examination/treatment/procedure(s) were conducted as a shared visit with non-physician practitioner(s) and myself.  I personally evaluated the patient during the encounter.  Clinical Impression:   Final diagnoses:  Pain of left lower extremity due to ischemia         Noemi Chapel, MD 01/01/20 2241

## 2019-12-31 NOTE — Consult Note (Signed)
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 male.  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  He had also fallen with abrasions to his toes on his 3rd-5th toes on his left foot.    In the ED, he was found to have a cold left foot without audible flow at the level of his ankle and was set to have emergent thrombectomy to attempt limb salvage, however labs were notable for a K of 6.6, CO2 17, and BUN/Cr of 50/5.69.   EKG was without peaked t-waves and his hyperkalemia was treated in the ED with IV insulin/D5, IV bicarb, and lokelma.  His surgery has been postponed due to his electrolyte abnormalities as well as his worsening renal function.  The trend in Scr is seen below.  He was initially evaluated by Dr. [name] at [facility] on 09/17/19 and was noted to have nephrotic syndrome (urine protein of 7.6 grams/24 hours presumably due to his poorly controlled DM).  He was seen by our practice by Dr. Joelyn Oms on 11/15/19 during his most recent hospitalization for another ischemic CVA.  He has not followed up with Dr. [name] or our office since his discharge.   This history was obtained with the use of a virtual [place] interpreter.  Trend in Creatinine: Creatinine, Ser  Date/Time Value Ref Range Status  12/31/2019 04:20 PM 5.69 (H) 0.61 - 1.24 mg/dL Final  11/17/2019 03:00 AM 4.80 (H) 0.61 - 1.24 mg/dL Final  11/16/2019 05:40 AM 4.29 (H) 0.61 - 1.24 mg/dL Final  11/15/2019 01:10 AM 4.57 (H) 0.61 - 1.24 mg/dL Final  11/14/2019 05:50 PM 4.54 (H) 0.61  - 1.24 mg/dL Final  11/14/2019 01:24 PM 5.10 (H) 0.61 - 1.24 mg/dL Final  11/14/2019 01:05 PM 4.67 (H) 0.61 - 1.24 mg/dL Final  11/12/2019 04:16 PM 4.06 (H) 0.76 - 1.27 mg/dL Final  10/02/2019 04:08 AM 3.63 (H) 0.61 - 1.24 mg/dL Final  10/01/2019 04:14 AM 3.52 (H) 0.61 - 1.24 mg/dL Final  09/30/2019 10:13 AM 3.80 (H) 0.61 - 1.24 mg/dL Final  09/29/2019 05:53 PM 3.77 (H) 0.61 - 1.24 mg/dL Final  08/14/2019 11:07 AM 2.67 (H) 0.61 - 1.24 mg/dL Final  08/13/2019 01:50 PM 2.55 (H) 0.61 - 1.24 mg/dL Final  08/11/2019 10:54 AM 2.60 (H) 0.61 - 1.24 mg/dL Final  08/11/2019 10:48 AM 2.72 (H) 0.61 - 1.24 mg/dL Final  08/19/2016 12:59 PM 0.83 0.61 - 1.24 mg/dL Final    PMH:   Past Medical History:  Diagnosis Date  . CKD stage 4 due to type 2 diabetes mellitus ([place]) 08/11/2019  . Diabetes mellitus without complication ([place])   . Hyperlipidemia   . Nephrotic syndrome in diabetes mellitus ([place]) 09/17/2019  . PAD (peripheral artery disease) ([place]) 12/31/2019  . Stroke [facility])     PSH:   Past Surgical History:  Procedure Laterality Date  . BUBBLE STUDY  08/13/2019   Procedure: BUBBLE STUDY;  Surgeon: [name], MD;  Location: [place] ENDOSCOPY;  Service: Cardiovascular;;  . LOOP RECORDER INSERTION N/A 08/13/2019   Procedure: LOOP RECORDER INSERTION;  Surgeon: Lovena Le,  Champ Mungo, MD;  Location: [place] CV LAB;  Service: Cardiovascular;  Laterality: N/A;  . TEE WITHOUT CARDIOVERSION N/A 08/13/2019   Procedure: TRANSESOPHAGEAL ECHOCARDIOGRAM (TEE);  Surgeon: [name], MD;  Location: [facility] ENDOSCOPY;  Service: Cardiovascular;  Laterality: N/A;  PATIENT NEEDS LOOP    Allergies: No Known Allergies  Medications:   Prior to Admission medications   Medication Sig Start Date End Date Taking? Authorizing Provider  acetaminophen (TYLENOL) 650 MG CR tablet Take 1,300 mg by mouth every 8 (eight) hours as needed for pain.   Yes [provider]  amLODipine (NORVASC) 10 MG tablet Take 1 tablet  (10 mg total) by mouth daily. 11/12/19  Yes Fulp, Cammie, MD  aspirin 81 MG EC tablet Take 1 tablet (81 mg total) by mouth daily. 11/18/19  Yes Oswald Hillock, MD  atorvastatin (LIPITOR) 80 MG tablet Take 1 tablet (80 mg total) by mouth daily at 6 PM. 11/18/19  Yes [name], Marge Duncans, MD  blood glucose meter kit and supplies KIT Dispense based on patient and insurance preference. Use up to four times daily as directed. (FOR ICD-9 250.00, 250.01). 08/14/19  Yes [name], DO  ezetimibe (ZETIA) 10 MG tablet Take 1 tablet (10 mg total) by mouth daily. 11/19/19  Yes [name], Marge Duncans, MD  insulin aspart (NOVOLOG) 100 UNIT/ML FlexPen Inject 4 Units into the skin 3 (three) times daily with meals. 11/18/19  Yes Oswald Hillock, MD  Insulin Glargine (LANTUS) 100 UNIT/ML Solostar Pen Inject 12 Units into the skin daily. 11/18/19  Yes Oswald Hillock, MD  loratadine (CLARITIN) 10 MG tablet Take 1 tablet (10 mg total) by mouth daily. For sneezing/itchy eyes 11/12/19  Yes Fulp, Cammie, MD  ticagrelor (BRILINTA) 90 MG TABS tablet Take 1 tablet (90 mg total) by mouth 2 (two) times daily. 11/18/19  Yes Oswald Hillock, MD  erythromycin ophthalmic ointment Place a 1/2 inch ribbon of ointment into the lower eyelid four times a day Patient not taking: Reported on 12/31/2019 11/12/19   Fulp, Ander Gaster, MD  metoprolol tartrate (LOPRESSOR) 25 MG tablet Take 1 tablet (25 mg total) by mouth 2 (two) times daily. Patient not taking: Reported on 12/31/2019 11/18/19 11/17/20  Oswald Hillock, MD  nicotine (NICODERM CQ - DOSED IN MG/24 HOURS) 21 mg/24hr patch Place 1 patch (21 mg total) onto the skin daily. Patient not taking: Reported on 11/12/2019 08/15/19   [name], DO    Inpatient medications:   Discontinued Meds:   Medications Discontinued During This Encounter  Medication Reason  . Latanoprost (XELPROS) 0.005 % EMUL No longer needed (for PRN medications)  . timolol (BETIMOL) 0.25 % ophthalmic solution No longer needed (for PRN  medications)    Social History:  reports that he has been smoking. He has never used smokeless tobacco. He reports that he does not drink alcohol. No history on file for drug.  Family History:   Family History  Problem Relation Age of Onset  . Other Neg Hx        patient denies any particular family medical history    Pertinent items are noted in HPI. Weight change:  No intake or output data in the 24 hours ending 12/31/19 2030 BP (!) 175/71   Pulse (!) 105   Temp 98.2 F (36.8 C) (Oral)   Resp 17   SpO2 99%  Vitals:   12/31/19 1830 12/31/19 1845 12/31/19 1900 12/31/19 1930  BP: (!) 175/76 (!) 169/73 (!) 157/79 Marland Kitchen)  175/71  Pulse: 97 100  (!) 105  Resp:    17  Temp:      TempSrc:      SpO2: 100% 100%  99%     General appearance: no distress and chronically ill-appearing Head: Normocephalic, without obvious abnormality, atraumatic Eyes: negative findings: lids and lashes normal, conjunctivae and sclerae normal and corneas clear Resp: clear to auscultation bilaterally Cardio: regular rate and rhythm and no rub GI: soft, non-tender; bowel sounds normal; no masses,  no organomegaly Extremities: cold and cyanotic left foot, no dorsalis pedis pulse appreciated, abrasions of his knee and on the distal parts of his left 3rd-5th toes, no edema Neuro: no asterixis  Labs: Basic Metabolic Panel: Recent Labs  Lab 12/31/19 1620 12/31/19 1743  NA 138  --   K 6.6* 6.4*  CL 109  --   CO2 17*  --   GLUCOSE 119*  --   BUN 50*  --   CREATININE 5.69*  --   CALCIUM 8.9  --    Liver Function Tests: No results for input(s): AST, ALT, ALKPHOS, BILITOT, PROT, ALBUMIN in the last 168 hours. No results for input(s): LIPASE, AMYLASE in the last 168 hours. No results for input(s): AMMONIA in the last 168 hours. CBC: Recent Labs  Lab 12/31/19 1530  WBC 14.5*  NEUTROABS 10.4*  HGB 10.6*  HCT 35.0*  MCV 91.6  PLT 748*   PT/INR: @LABRCNTIP (inr:5) Cardiac Enzymes: )No results for  input(s): CKTOTAL, CKMB, CKMBINDEX, TROPONINI in the last 168 hours. CBG: No results for input(s): GLUCAP in the last 168 hours.  Iron Studies: No results for input(s): IRON, TIBC, TRANSFERRIN, FERRITIN in the last 168 hours.  Xrays/Other Studies: No results found.   Assessment/Plan: 1.  Hyperkalemia- presumably due to AKI/CKD vs progressive CKD.  Will cont with lokelma and dose with IV lasix and follow potassium levels. 2. AKI/CKD stage 4 vs progressive CKD due to poorly controlled DM and HTN.  Workup thus far revealed negative HIV, hep C, and hepatitis B. He had been started on losartan but developed hyperkalemia and it was discontinued.   1. He likely has progressive CKD and is nearing the need to initiate HD if he does not respond to medical management.   2. No emergent indication to initiate HD at this time and will continue to follow.  3. Nephrotic syndrome- presumably due to diabetic nephropathy but is significant amount of proteinuria.  Given the chronicity and severity of his CKD, I don't think a renal biopsy would be helpful at this point. 4. Critical ischemia of left foot- vascular surgery has evaluated him and is planning for attempts at revascularization tomorrow if K level improves 5. Recurrent ischemic CVA's- possibly embolic but more likely due to hypercoagulable state from his nephrotic syndrome or another cause.  Will order hypercoagulable panel and follow. 6. Anemia of CKD stage 4- will check iron stores and follow H/H. 7. HTN- poorly controlled. Resume outpatient meds and follow.  8. HLD- on statin 9. Tobacco use- nicotine patch per primary svc 10. IDDM- poorly controlled.  Per primary svc   Donetta Potts 12/31/2019, 8:30 PM

## 2019-12-31 NOTE — H&P (Addendum)
Hospital Consult    Reason for Consult:  Ischemic left lower extremity Requesting Physician:  EDP MRN #:  [id number]  History of Present Illness: This is a 49 y.o. male with past medical history significant for insulin-dependent diabetes mellitus, recent CVA 6 weeks ago, now presents to the emergency department with 48 hours of painful cold left foot.  He is normally ambulatory without assistance.  With the use of the video interpreter patient explains his foot is now numb with decreased motor.  Based on home medications he is on Brilinta from previous CVA.  Patient also states wounds of toes is from a traumatic fall.  Based on chart review he is an everyday smoker.  Past Medical History:  Diagnosis Date  . Diabetes mellitus without complication ([place])   . Stroke [facility])     Past Surgical History:  Procedure Laterality Date  . BUBBLE STUDY  08/13/2019   Procedure: BUBBLE STUDY;  Surgeon: [name], MD;  Location: [place] ENDOSCOPY;  Service: Cardiovascular;;  . LOOP RECORDER INSERTION N/A 08/13/2019   Procedure: LOOP RECORDER INSERTION;  Surgeon: [name], MD;  Location: [place] CV LAB;  Service: Cardiovascular;  Laterality: N/A;  . TEE WITHOUT CARDIOVERSION N/A 08/13/2019   Procedure: TRANSESOPHAGEAL ECHOCARDIOGRAM (TEE);  Surgeon: [name], MD;  Location: [facility] ENDOSCOPY;  Service: Cardiovascular;  Laterality: N/A;  PATIENT NEEDS LOOP    No Known Allergies  Prior to Admission medications   Medication Sig Start Date End Date Taking? Authorizing Provider  acetaminophen (TYLENOL) 650 MG CR tablet Take 1,300 mg by mouth every 8 (eight) hours as needed for pain.   Yes [provider]  amLODipine (NORVASC) 10 MG tablet Take 1 tablet (10 mg total) by mouth daily. 11/12/19  Yes Fulp, Cammie, MD  aspirin 81 MG EC tablet Take 1 tablet (81 mg total) by mouth daily. 11/18/19  Yes Oswald Hillock, MD  atorvastatin (LIPITOR) 80 MG tablet Take 1 tablet (80 mg total) by mouth  daily at 6 PM. 11/18/19  Yes [name], Marge Duncans, MD  blood glucose meter kit and supplies KIT Dispense based on patient and insurance preference. Use up to four times daily as directed. (FOR ICD-9 250.00, 250.01). 08/14/19  Yes [name], DO  ezetimibe (ZETIA) 10 MG tablet Take 1 tablet (10 mg total) by mouth daily. 11/19/19  Yes [name], Marge Duncans, MD  insulin aspart (NOVOLOG) 100 UNIT/ML FlexPen Inject 4 Units into the skin 3 (three) times daily with meals. 11/18/19  Yes Oswald Hillock, MD  Insulin Glargine (LANTUS) 100 UNIT/ML Solostar Pen Inject 12 Units into the skin daily. 11/18/19  Yes Oswald Hillock, MD  loratadine (CLARITIN) 10 MG tablet Take 1 tablet (10 mg total) by mouth daily. For sneezing/itchy eyes 11/12/19  Yes Fulp, Cammie, MD  ticagrelor (BRILINTA) 90 MG TABS tablet Take 1 tablet (90 mg total) by mouth 2 (two) times daily. 11/18/19  Yes Oswald Hillock, MD  erythromycin ophthalmic ointment Place a 1/2 inch ribbon of ointment into the lower eyelid four times a day Patient not taking: Reported on 12/31/2019 11/12/19   Fulp, Ander Gaster, MD  metoprolol tartrate (LOPRESSOR) 25 MG tablet Take 1 tablet (25 mg total) by mouth 2 (two) times daily. Patient not taking: Reported on 12/31/2019 11/18/19 11/17/20  Oswald Hillock, MD  nicotine (NICODERM CQ - DOSED IN MG/24 HOURS) 21 mg/24hr patch Place 1 patch (21 mg total) onto the skin daily. Patient not taking: Reported on 11/12/2019 08/15/19  [name], DO    Social History   Socioeconomic History  . Marital status: Single    Spouse name: Not on file  . Number of children: 3  . Years of education: Not on file  . Highest education level: Not on file  Occupational History  . 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 Smoking status: Current Every Day Smoker  . Smokeless tobacco: Never Used  Substance and Sexual Activity  . Alcohol use: No  . Drug use: Not on file  . Sexual activity: Not on file    Other Topics Concern  . Not on file  Social History Narrative   Mr RAGE BEEVER is [place] in [place] with work green card   He is able to understand some [place] translator needed at times   His Daughter [name] is primary caregiver but lives in [place] in [place] in November 2020   He has a son living in [place] with [name]   He has a son who lives in [place]- assists only prn   November 2020 -he is unemployed- previous job at Company secretary in [place] Strain: High Risk  . Difficulty of Paying Living Expenses: Very hard  Food Insecurity: Food Insecurity Present  . Worried About Charity fundraiser in the Last Year: Often true  . Ran Out of Food in the Last Year: Often true  Transportation Needs: Unmet Transportation Needs  . Lack of Transportation (Medical): Yes  . Lack of Transportation (Non-Medical): Yes  Physical Activity:   . Days of Exercise per Week: Not on file  . Minutes of Exercise per Session: Not on file  Stress:   . Feeling of Stress : Not on file  Social Connections: Unknown  . Frequency of Communication with Friends and Family: More than three times a week  . Frequency of Social Gatherings with Friends and Family: Once a week  . Attends Religious Services: Never  . Active Member of Clubs or Organizations: No  . Attends Archivist Meetings: Never  . Marital Status: Not asked  Intimate Partner Violence:   . Fear of Current or Ex-Partner: Not on file  . Emotionally Abused: Not on file  . Physically Abused: Not on file  . Sexually Abused: Not on file     Family History  Problem Relation Age of Onset  . Other Neg Hx        patient denies any particular family medical history    ROS: Otherwise negative unless mentioned in HPI  Physical Examination  Vitals:   12/31/19 1621 12/31/19 1630  BP: (!) 178/88 (!) 189/84  Pulse:    Resp: 17 17  Temp:    SpO2:     There is no height or  weight on file to calculate BMI.  General:  WDWN in NAD Gait: Not observed HENT: WNL, normocephalic Pulmonary: normal non-labored breathing, without Rales, rhonchi,  wheezing Cardiac: tachycardia Abdomen:  soft, NT/ND, no masses Skin: without rashes Vascular Exam/Pulses: Symmetrical 2+ femoral pulses; faintly palpable right popliteal pulse; brisk right posterior tibial artery signal by Doppler; no palpable popliteal pulse left leg; no palpable cord dopplerable signals left foot; left foot cool to touch with decreased motor and sensation Extremities: Dusky appearing left foot with dry eschar of multiple toes; dry eschar left knee and shin Musculoskeletal: no  muscle wasting or atrophy  Neurologic: A&O X 3;  No focal weakness or paresthesias are detected; speech is fluent/normal Psychiatric:  The pt has Normal affect. Lymph:  Unremarkable      CBC    Component Value Date/Time   WBC 14.5 (H) 12/31/2019 1530   RBC 3.82 (L) 12/31/2019 1530   HGB 10.6 (L) 12/31/2019 1530   HGB 11.3 (L) 11/12/2019 1616   HCT 35.0 (L) 12/31/2019 1530   HCT 34.1 (L) 11/12/2019 1616   PLT 748 (H) 12/31/2019 1530   PLT 527 (H) 11/12/2019 1616   MCV 91.6 12/31/2019 1530   MCV 84 11/12/2019 1616   MCH 27.7 12/31/2019 1530   MCHC 30.3 12/31/2019 1530   RDW 15.5 12/31/2019 1530   RDW 14.5 11/12/2019 1616   LYMPHSABS 2.4 12/31/2019 1530   MONOABS 0.9 12/31/2019 1530   EOSABS 0.7 (H) 12/31/2019 1530   BASOSABS 0.1 12/31/2019 1530    BMET    Component Value Date/Time   NA 138 12/31/2019 1620   NA 139 11/12/2019 1616   K 6.6 (HH) 12/31/2019 1620   CL 109 12/31/2019 1620   CO2 17 (L) 12/31/2019 1620   GLUCOSE 119 (H) 12/31/2019 1620   BUN 50 (H) 12/31/2019 1620   BUN 34 (H) 11/12/2019 1616   CREATININE 5.69 (H) 12/31/2019 1620   CALCIUM 8.9 12/31/2019 1620   GFRNONAA 11 (L) 12/31/2019 1620   GFRAA 13 (L) 12/31/2019 1620    COAGS: Lab Results  Component Value Date   INR 1.0 12/31/2019   INR  0.9 11/14/2019   INR 0.9 09/29/2019     ASSESSMENT/PLAN: This is a 49 y.o. male with critical limb ischemia of left lower extremity  -Patient has an ischemic left foot with decreased motor and sensation -Plan is to proceed emergently to the operating room for thrombectomy of left lower extremity and possible bypass by Dr. Donnetta Hutching -rapid covid collected -Consent obtained with help of video interpreter -Patient does have evidence of PAD with only dopplerable signals right foot however given recent CVA and now ischemic left lower extremity, an embolic episode seems to be the most likely etiology.  If renal function improves, patient would benefit from CTA chest/abd/pelvis to evaluate for embolic source   Addendum:  BMP returned with a potassium of >6 and what appears to be worsening renal function.  I spoke with Dr. Myna Hidalgo who will admit patient this evening for treatment of hyperkalemia and medical optimization for surgery tomorrow morning.  Patient will be NPO past midnight.   Dagoberto Ligas PA-C [facility] [phone]

## 2019-12-31 NOTE — Anesthesia Preprocedure Evaluation (Addendum)
Anesthesia Evaluation  Patient identified by MRN, date of birth, ID band Patient awake    Reviewed: Allergy & Precautions, H&P , NPO status , Patient's Chart, lab work & pertinent test results, reviewed documented beta blocker date and time   Airway Mallampati: II  TM Distance: >3 FB Neck ROM: full    Dental no notable dental hx. (+) Edentulous Upper, Edentulous Lower, Upper Dentures, Lower Dentures   Pulmonary neg pulmonary ROS, Current Smoker,    Pulmonary exam normal breath sounds clear to auscultation       Cardiovascular Exercise Tolerance: Good hypertension, + Peripheral Vascular Disease   Rhythm:regular Rate:Normal  ECHO 1/21 FINDINGS  Left Ventricle: Left ventricular ejection fraction, by visual estimation,  is 50%. The left ventricle has low normal function. The left ventricle has  no regional wall motion abnormalities. There is moderately increased left  ventricular hypertrophy.  Concentric left ventricular hypertrophy. Left ventricular diastolic  parameters are consistent with Grade I diastolic dysfunction (impaired  relaxation). Elevated left ventricular end-diastolic pressure.    Neuro/Psych CVA negative psych ROS   GI/Hepatic negative GI ROS, Neg liver ROS,   Endo/Other  diabetes, Type 2  Renal/GU ESRFRenal disease  negative genitourinary   Musculoskeletal   Abdominal   Peds  Hematology  (+) Blood dyscrasia, anemia ,   Anesthesia Other Findings   Reproductive/Obstetrics negative OB ROS                           Anesthesia Physical Anesthesia Plan  ASA: III  Anesthesia Plan: MAC   Post-op Pain Management:    Induction: Intravenous  PONV Risk Score and Plan: 1  Airway Management Planned: Mask, Natural Airway and Nasal Cannula  Additional Equipment:   Intra-op Plan:   Post-operative Plan:   Informed Consent: I have reviewed the patients History and Physical,  chart, labs and discussed the procedure including the risks, benefits and alternatives for the proposed anesthesia with the patient or authorized representative who has indicated his/her understanding and acceptance.     Dental Advisory Given  Plan Discussed with: CRNA and Anesthesiologist  Anesthesia Plan Comments:        Anesthesia Quick Evaluation

## 2019-12-31 NOTE — H&P (Addendum)
History and Physical    Phillip Camacho TMH:[id number] DOB: 08/11/1971 DOA: 12/31/2019  PCP: [name], MD   Patient coming from: Home   Chief Complaint: Left foot pain   HPI: Phillip Camacho is a 49 y.o. male with medical history significant for insulin-dependent diabetes mellitus, hypertension, chronic kidney disease stage IV, and recurrent CVAs, likely embolic, now presenting to the emergency department with left foot pain.  History is limited by the patient's cognitive deficits and language barrier despite use of video interpreter [name], [phone]).  Patient reports that his left foot began to ache today though his son, [name], who is also his caretaker reports that the patient has been complaining of left leg pain for weeks. Patient has not been coughing and has not appeared short of breath.  He denies any chest pain or fevers. Since returning from the hospital after recurrent stroke in January 2021, the patient has been confused, suffering increased weakness, mainly on his left side, and has not been eating or drinking much at all despite encouragement from his son. He requires close supervision and help with ADLs at home.    ED Course: Upon arrival to the ED, patient is found to be afebrile, saturating well on room air, stable blood pressure.  EKG features sinus rhythm with LVH and repolarization abnormality.  Chemistry panel notable for potassium 6.6, bicarbonate 17, BUN 50, and creatinine 5.69, up from 4.8 in January 2021.  CBC notable for leukocytosis to 14,500, stable normocytic anemia, and increased thrombocytosis with platelets of 748,000.  INR is normal.  Covid and influenza PCR are negative.  Type and screen was performed in the ED and the patient was treated with insulin/dextrose, bicarbonate, and Lokelma.  He was also given a heparin bolus.  Vascular surgery was consulted by the ED physician admitted plans to attempt revascularization in the morning if hyperkalemia can be stabilized.   Nephrology was also consulted by the ED physician.  Hospitalist were asked to admit for medical management and optimization for surgery.  Review of Systems:  All other systems reviewed and apart from HPI, are negative.  Past Medical History:  Diagnosis Date  . Diabetes mellitus without complication ([place])   . Stroke [facility])     Past Surgical History:  Procedure Laterality Date  . BUBBLE STUDY  08/13/2019   Procedure: BUBBLE STUDY;  Surgeon: [name], MD;  Location: [facility];  Service: Cardiovascular;;  . LOOP RECORDER INSERTION N/A 08/13/2019   Procedure: LOOP RECORDER INSERTION;  Surgeon: [name], MD;  Location: [facility];  Service: Cardiovascular;  Laterality: N/A;  . TEE WITHOUT CARDIOVERSION N/A 08/13/2019   Procedure: TRANSESOPHAGEAL ECHOCARDIOGRAM (TEE);  Surgeon: [name], MD;  Location: [facility] [place];  Service: Cardiovascular;  Laterality: N/A;  PATIENT NEEDS LOOP     reports that he has been smoking. He has never used smokeless tobacco. He reports that he does not drink alcohol. No history on file for drug.  No Known Allergies  Family History  Problem Relation Age of Onset  . Other Neg Hx        patient denies any particular family medical history     Prior to Admission medications   Medication Sig Start Date End Date Taking? Authorizing Provider  acetaminophen (TYLENOL) 650 MG CR tablet Take 1,300 mg by mouth every 8 (eight) hours as needed for pain.   Yes [provider]  amLODipine (NORVASC) 10 MG tablet Take 1 tablet (10 mg total) by mouth daily.  11/12/19  Yes Fulp, Cammie, MD  aspirin 81 MG EC tablet Take 1 tablet (81 mg total) by mouth daily. 11/18/19  Yes Oswald Hillock, MD  atorvastatin (LIPITOR) 80 MG tablet Take 1 tablet (80 mg total) by mouth daily at 6 PM. 11/18/19  Yes [name], Marge Duncans, MD  blood glucose meter kit and supplies KIT Dispense based on patient and insurance preference. Use up to four times daily as directed.  (FOR ICD-9 250.00, 250.01). 08/14/19  Yes [name], DO  ezetimibe (ZETIA) 10 MG tablet Take 1 tablet (10 mg total) by mouth daily. 11/19/19  Yes [name], Marge Duncans, MD  insulin aspart (NOVOLOG) 100 UNIT/ML FlexPen Inject 4 Units into the skin 3 (three) times daily with meals. 11/18/19  Yes Oswald Hillock, MD  Insulin Glargine (LANTUS) 100 UNIT/ML Solostar Pen Inject 12 Units into the skin daily. 11/18/19  Yes Oswald Hillock, MD  loratadine (CLARITIN) 10 MG tablet Take 1 tablet (10 mg total) by mouth daily. For sneezing/itchy eyes 11/12/19  Yes Fulp, Cammie, MD  ticagrelor (BRILINTA) 90 MG TABS tablet Take 1 tablet (90 mg total) by mouth 2 (two) times daily. 11/18/19  Yes Oswald Hillock, MD  erythromycin ophthalmic ointment Place a 1/2 inch ribbon of ointment into the lower eyelid four times a day Patient not taking: Reported on 12/31/2019 11/12/19   Fulp, Ander Gaster, MD  metoprolol tartrate (LOPRESSOR) 25 MG tablet Take 1 tablet (25 mg total) by mouth 2 (two) times daily. Patient not taking: Reported on 12/31/2019 11/18/19 11/17/20  Oswald Hillock, MD  nicotine (NICODERM CQ - DOSED IN MG/24 HOURS) 21 mg/24hr patch Place 1 patch (21 mg total) onto the skin daily. Patient not taking: Reported on 11/12/2019 08/15/19   [name], DO    Physical Exam: Vitals:   12/31/19 1800 12/31/19 1830 12/31/19 1845 12/31/19 1900  BP: (!) 171/87 (!) 175/76 (!) 169/73 (!) 157/79  Pulse:  97 100   Resp: 14     Temp:      TempSrc:      SpO2:  100% 100%      Constitutional: NAD, calm  Eyes: PERTLA, lids and conjunctivae normal ENMT: Mucous membranes are moist. Posterior pharynx clear of any exudate or lesions.   Neck: normal, supple, no masses, no thyromegaly Respiratory: no wheezing, no crackles. No accessory muscle use.  Cardiovascular: S1 & S2 heard, regular rate and rhythm. No extremity edema. Left popliteal and pedal pulses not palpable.  Abdomen: No distension, no tenderness, soft. Bowel sounds active.    Musculoskeletal: no clubbing / cyanosis. No joint deformity upper and lower extremities.   Skin: Eschar involving digits 3-5 on left foot. Warm, dry, well-perfused. Neurologic: Left facial weakness. Dysarthria. Moving all extremities, weaker on left.  Psychiatric: Alert and oriented to person and place. Pleasant and cooperative.    Labs and Imaging on Admission: I have personally reviewed following labs and imaging studies  CBC: Recent Labs  Lab 12/31/19 1530  WBC 14.5*  NEUTROABS 10.4*  HGB 10.6*  HCT 35.0*  MCV 91.6  PLT 007*   Basic Metabolic Panel: Recent Labs  Lab 12/31/19 1620 12/31/19 1743  NA 138  --   K 6.6* 6.4*  CL 109  --   CO2 17*  --   GLUCOSE 119*  --   BUN 50*  --   CREATININE 5.69*  --   CALCIUM 8.9  --    GFR: CrCl cannot be calculated (Unknown ideal weight.).  Liver Function Tests: No results for input(s): AST, ALT, ALKPHOS, BILITOT, PROT, ALBUMIN in the last 168 hours. No results for input(s): LIPASE, AMYLASE in the last 168 hours. No results for input(s): AMMONIA in the last 168 hours. Coagulation Profile: Recent Labs  Lab 12/31/19 1530  INR 1.0   Cardiac Enzymes: No results for input(s): CKTOTAL, CKMB, CKMBINDEX, TROPONINI in the last 168 hours. BNP (last 3 results) No results for input(s): PROBNP in the last 8760 hours. HbA1C: No results for input(s): HGBA1C in the last 72 hours. CBG: No results for input(s): GLUCAP in the last 168 hours. Lipid Profile: No results for input(s): CHOL, HDL, LDLCALC, TRIG, CHOLHDL, LDLDIRECT in the last 72 hours. Thyroid Function Tests: No results for input(s): TSH, T4TOTAL, FREET4, T3FREE, THYROIDAB in the last 72 hours. Anemia Panel: No results for input(s): VITAMINB12, FOLATE, FERRITIN, TIBC, IRON, RETICCTPCT in the last 72 hours. Urine analysis:    Component Value Date/Time   COLORURINE YELLOW 09/30/2019 1009   APPEARANCEUR CLEAR 09/30/2019 1009   LABSPEC 1.017 09/30/2019 1009   PHURINE 6.0  09/30/2019 1009   GLUCOSEU 150 (A) 09/30/2019 1009   HGBUR NEGATIVE 09/30/2019 1009   BILIRUBINUR NEGATIVE 09/30/2019 1009   [place] 09/30/2019 1009   PROTEINUR 100 (A) 09/30/2019 1009   NITRITE NEGATIVE 09/30/2019 1009   LEUKOCYTESUR NEGATIVE 09/30/2019 1009   Sepsis Labs: @LABRCNTIP (procalcitonin:4,lacticidven:4) ) Recent Results (from the past 240 hour(s))  Respiratory Panel by RT PCR (Flu A&B, Covid) - Nasopharyngeal Swab     Status: None   Collection Time: 12/31/19  5:23 PM   Specimen: Nasopharyngeal Swab  Result Value Ref Range Status   SARS Coronavirus 2 by RT PCR NEGATIVE NEGATIVE Final    Comment: (NOTE) SARS-CoV-2 target nucleic acids are NOT DETECTED. The SARS-CoV-2 RNA is generally detectable in upper respiratoy specimens during the acute phase of infection. The lowest concentration of SARS-CoV-2 viral copies this assay can detect is 131 copies/mL. A negative result does not preclude SARS-Cov-2 infection and should not be used as the sole basis for treatment or other patient management decisions. A negative result may occur with  improper specimen collection/handling, submission of specimen other than nasopharyngeal swab, presence of viral mutation(s) within the areas targeted by this assay, and inadequate number of viral copies (<131 copies/mL). A negative result must be combined with clinical observations, patient history, and epidemiological information. The expected result is Negative. Fact Sheet for Patients:  PinkCheek.be Fact Sheet for Healthcare Providers:  GravelBags.it This test is not yet ap proved or cleared by the [place] FDA and  has been authorized for detection and/or diagnosis of SARS-CoV-2 by FDA under an Emergency Use Authorization (EUA). This EUA will remain  in effect (meaning this test can be used) for the duration of the COVID-19 declaration under Section 564(b)(1) of  the Act, 21 U.S.C. section 360bbb-3(b)(1), unless the authorization is terminated or revoked sooner.    Influenza A by PCR NEGATIVE NEGATIVE Final   Influenza B by PCR NEGATIVE NEGATIVE Final    Comment: (NOTE) The Xpert Xpress SARS-CoV-2/FLU/RSV assay is intended as an aid in  the diagnosis of influenza from Nasopharyngeal swab specimens and  should not be used as a sole basis for treatment. Nasal washings and  aspirates are unacceptable for Xpert Xpress SARS-CoV-2/FLU/RSV  testing. Fact Sheet for Patients: PinkCheek.be Fact Sheet for Healthcare Providers: GravelBags.it This test is not yet approved or cleared by the [place] FDA and  has been authorized for detection and/or diagnosis  of SARS-CoV-2 by  FDA under an Emergency Use Authorization (EUA). This EUA will remain  in effect (meaning this test can be used) for the duration of the  Covid-19 declaration under Section 564(b)(1) of the Act, 21  U.S.C. section 360bbb-3(b)(1), unless the authorization is  terminated or revoked. Performed at [facility], [place] [address] [place], [place] [address]      Radiological Exams on Admission: No results found.  EKG: Independently reviewed. Sinus rhythm, LVH with repolarization abnormality.   Assessment/Plan   1. Hyperkalemia; acute kidney injury superimposed on CKD IV  - Presents with left foot pain and found to have serum potassium of 6.6 and SCr or 5.69, up from 4.80 in January - PR and QRS appear normal on EKG  - Patient has not been eating or drinking much at all recently per his son/caretaker, and this is likely prerenal, will check FENa  - He was treated in ED with insulin/dextrose, bicarb, and Lokelma  - Continue cardiac monitoring, start IVF hydration, follow serial potassium levels and repeat chemistry panel in am     2. Critical ischemia left lower extremity   - Presents with left foot pain; patient  states started today but has been complaining of this for weeks per his son/caretaker   - He is found to have severe ischemia and vascular surgery planning for revascularization once hyperkalemia stabilized    3. History recurrent CVAs - Patient was admitted in January with recurrent CVA, likely embolic, has had increased weakness on the left and cognitive deficits since then  - Continue Lipitor, ASA, and Brillinta    4. Insulin-dependent DM  - A1c was 8.6% In January 2021  - Check CBGs and use SSI with Novolog    5. Hypertension  - Continue Norvasc    6. Social issues  - Patient lives with his son who works during the day and has had difficulty caring for the patient who has been frequently confused since the recent strokes and is often unable to perform basic ADLs on his own  - SNF will likely be needed once the patient is ready to be discharged from the hospital    DVT prophylaxis: sq heparin  Code Status: Full  Family Communication: Arlene Genova (son and caretaker) updated by phone  Disposition Plan: Likely needs SNF after clearance from vascular surgery and nephrology  Consults called: Vascular surgery and nephrology consulted by ED physician Admission status: Inpatient    Vianne Bulls, MD [facility] Pager: See [email or web address]  If 7AM-7PM, please contact the daytime attending [email or web address]  12/31/2019, 7:35 PM

## 2019-12-31 NOTE — ED Notes (Signed)
Per MD, pt was notified that surgery has been delayed until AM due to hyperkalemia and creatine clearance. Pt verbalized understanding.

## 2019-12-31 NOTE — ED Provider Notes (Addendum)
[facility] EMERGENCY DEPARTMENT Provider Note   CSN: [id number] Arrival date & time: 12/31/19  1419     History Chief Complaint  Patient presents with  . Leg Pain    Phillip Camacho is a 49 y.o. male.  HPI HPI Comments: Phillip Camacho is a 49 y.o. male with a history of CKD stage IV, central retinal arterial occlusion, diabetes mellitus type 2, CVA on 11/14/2019 who presents to the Emergency Department complaining of worsening left foot and ankle pain x 2 days.  Per EMS, his son states that since his last CVA he has experienced weakness in his left lower extremity but notes this has worsened during the past week.  Patient speaks little English but notes that he has worsening pain in his left medial ankle and dorsal foot which has been ongoing the past two days.  He notes associated color change and decreased warmth. He denies any CP, HA or SOB at this time.     Past Medical History:  Diagnosis Date  . Diabetes mellitus without complication ([place])   . Stroke [facility])     Patient Active Problem List   Diagnosis Date Noted  . CKD (chronic kidney disease), stage IV ([place]) 11/14/2019  . Benign essential HTN 11/14/2019  . Hyperkalemia 11/14/2019  . 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 AKI (acute kidney injury) ([place]) 09/17/2019  . Nephrotic syndrome 09/17/2019  . Type 2 diabetes mellitus with stage 3b chronic kidney disease, with long-term current use of insulin ([place]) 09/17/2019  . Acute CVA (cerebrovascular accident) ([place]) 08/11/2019  . DM (diabetes mellitus) ([place]) 08/11/2019  . Benign hypertension with chronic kidney disease, stage III 08/11/2019  . Tobacco abuse 08/11/2019    Past Surgical History:  Procedure Laterality Date  . BUBBLE STUDY  08/13/2019   Procedure: BUBBLE STUDY;  Surgeon: [name], MD;  Location: [facility];  Service: Cardiovascular;;  . LOOP RECORDER INSERTION N/A 08/13/2019   Procedure: LOOP RECORDER INSERTION;  Surgeon:  [name], MD;  Location: [facility];  Service: Cardiovascular;  Laterality: N/A;  . TEE WITHOUT CARDIOVERSION N/A 08/13/2019   Procedure: TRANSESOPHAGEAL ECHOCARDIOGRAM (TEE);  Surgeon: [name], MD;  Location: [facility];  Service: Cardiovascular;  Laterality: N/A;  PATIENT NEEDS LOOP       Family History  Problem Relation Age of Onset  . Other Neg Hx        patient denies any particular family medical history    Social History   Tobacco Use  . Smoking status: Current Every Day Smoker  . Smokeless tobacco: Never Used  Substance Use Topics  . Alcohol use: No  . Drug use: Not on file    Home Medications Prior to Admission medications   Medication Sig Start Date End Date Taking? Authorizing Provider  acetaminophen (TYLENOL) 650 MG CR tablet Take 1,300 mg by mouth every 8 (eight) hours as needed for pain.    [provider]  amLODipine (NORVASC) 10 MG tablet Take 1 tablet (10 mg total) by mouth daily. 11/12/19   Fulp, Cammie, MD  aspirin 81 MG EC tablet Take 1 tablet (81 mg total) by mouth daily. 11/18/19   Oswald Hillock, MD  atorvastatin (LIPITOR) 80 MG tablet Take 1 tablet (80 mg total) by mouth daily at 6 PM. 11/18/19   Oswald Hillock, MD  blood glucose meter kit and supplies KIT Dispense based on patient and insurance preference. Use up to four times daily as directed. (  FOR ICD-9 250.00, 250.01). 08/14/19   [name], DO  erythromycin ophthalmic ointment Place a 1/2 inch ribbon of ointment into the lower eyelid four times a day Patient taking differently: Place 1 application into both eyes See admin instructions. Place a 1/2 inch ribbon of ointment into the lower eyelid four times a day 11/12/19   Fulp, Cammie, MD  ezetimibe (ZETIA) 10 MG tablet Take 1 tablet (10 mg total) by mouth daily. 11/19/19   Oswald Hillock, MD  insulin aspart (NOVOLOG) 100 UNIT/ML FlexPen Inject 4 Units into the skin 3 (three) times daily with meals. 11/18/19   Oswald Hillock,  MD  Insulin Glargine (LANTUS) 100 UNIT/ML Solostar Pen Inject 12 Units into the skin daily. 11/18/19   Oswald Hillock, MD  Latanoprost (XELPROS) 0.005 % EMUL Place 1 drop into both eyes at bedtime.    [provider]  loratadine (CLARITIN) 10 MG tablet Take 1 tablet (10 mg total) by mouth daily. For sneezing/itchy eyes 11/12/19   Fulp, Cammie, MD  metoprolol tartrate (LOPRESSOR) 25 MG tablet Take 1 tablet (25 mg total) by mouth 2 (two) times daily. 11/18/19 11/17/20  Oswald Hillock, MD  nicotine (NICODERM CQ - DOSED IN MG/24 HOURS) 21 mg/24hr patch Place 1 patch (21 mg total) onto the skin daily. Patient not taking: Reported on 11/12/2019 08/15/19   [name], DO  ticagrelor (BRILINTA) 90 MG TABS tablet Take 1 tablet (90 mg total) by mouth 2 (two) times daily. 11/18/19   Oswald Hillock, MD  timolol (BETIMOL) 0.25 % ophthalmic solution Place 1 drop into both eyes 2 (two) times daily.    [provider]    Allergies    Patient has no known allergies.  Review of Systems   Review of Systems  Unable to perform ROS: Other  Musculoskeletal: Positive for arthralgias, joint swelling and myalgias.  Skin: Positive for color change.   Physical Exam Updated Vital Signs BP (!) 174/78 (BP Location: Left Arm)   Pulse 100   Temp 98.2 F (36.8 C) (Oral)   Resp 18   SpO2 100%   Physical Exam Vitals and nursing note reviewed.  Constitutional:      General: He is in acute distress.     Appearance: He is not ill-appearing, toxic-appearing or diaphoretic.  HENT:     Head: Normocephalic.     Right Ear: External ear normal.     Left Ear: External ear normal.     Nose: Nose normal.     Mouth/Throat:     Mouth: Mucous membranes are moist.     Pharynx: Oropharynx is clear. No oropharyngeal exudate or posterior oropharyngeal erythema.  Eyes:     Extraocular Movements: Extraocular movements intact.  Cardiovascular:     Rate and Rhythm: Normal rate and regular rhythm.     Heart sounds:  No murmur. No friction rub. No gallop.      Comments: No palpable or dopplerable pulses appreciated in the bilateral feet.  Pulmonary:     Effort: Pulmonary effort is normal. No respiratory distress.     Breath sounds: Normal breath sounds. No stridor. No wheezing, rhonchi or rales.  Abdominal:     General: Abdomen is flat.     Tenderness: There is no abdominal tenderness.  Musculoskeletal:     Cervical back: Normal range of motion.  Skin:    Comments: Erythema and TTP noted to the left medial ankle, tibial region and dorsum of the left foot.  Decreased warmth noted in the left foot compared to right.  Necrosis noted in multiple toes dorsally just proximal to the nailbed.  Neurological:     Mental Status: He is alert.    ED Results / Procedures / Treatments   Labs (all labs ordered are listed, but only abnormal results are displayed) Labs Reviewed  CBC WITH DIFFERENTIAL/PLATELET - Abnormal; Notable for the following components:      Result Value   WBC 14.5 (*)    RBC 3.82 (*)    Hemoglobin 10.6 (*)    HCT 35.0 (*)    Platelets 748 (*)    Neutro Abs 10.4 (*)    Eosinophils Absolute 0.7 (*)    All other components within normal limits  APTT - Abnormal; Notable for the following components:   aPTT 37 (*)    All other components within normal limits  PROTIME-INR  BASIC METABOLIC PANEL  TYPE AND SCREEN  ABO/RH    EKG EKG Interpretation  Date/Time:  Wednesday December 31 2019 15:28:43 EST Ventricular Rate:  99 PR Interval:    QRS Duration: 82 QT Interval:  355 QTC Calculation: 456 R Axis:   68 Text Interpretation: Sinus rhythm LVH with secondary repolarization abnormality since last tracing no significant change Confirmed by Noemi Chapel [phone]) on 12/31/2019 4:01:29 PM   Radiology No results found.  Procedures Procedures (including critical care time)  Medications Ordered in ED Medications - No data to display  ED Course  I have reviewed the triage vital signs  and the nursing notes.  Pertinent labs & imaging results that were available during my care of the patient were reviewed by me and considered in my medical decision making (see chart for details).    MDM Rules/Calculators/A&P                      3:00 PM patient is a 49 year old [place] male who presents with worsening pain in his left ankle and foot.  Physical exam is significant for decreased warmth to touch in the left foot compared to the right as well as necrosis in multiple toes on the dorsal aspect just proximal to the nailbed.  He has a history of multiple CVAs with his most recent on January 26 of this year.  He was initially on DAPT with Plavix and aspirin and Plavix was switched to Brilinta at this time.  This patient was discussed with and also evaluated by my attending physician Dr. Noemi Chapel.  We were unable to find pedal pulses with a doppler.  Will obtain labs.  We will discuss with vascular surgery.  We will closely monitor.  3:19 PM our team spoke with Dr. Donnetta Hutching with the vascular surgery team.  They are coming to evaluate the patient.  4:08 PM Pt discussed with Arlean Hopping, PA-C, who will be taking over care for this patient.   Final Clinical Impression(s) / ED Diagnoses Final diagnoses:  None    Rx / DC Orders ED Discharge Orders    None       Rayna Sexton, PA-C 12/31/19 1610    Rayna Sexton, PA-C 12/31/19 1612    Noemi Chapel, MD 01/01/20 2241

## 2019-12-31 NOTE — Anesthesia Preprocedure Evaluation (Addendum)
Anesthesia Evaluation  Patient identified by MRN, date of birth, ID band Patient awake    Reviewed: Allergy & Precautions, NPO status , Patient's Chart, lab work & pertinent test results  History of Anesthesia Complications Negative for: history of anesthetic complications  Airway Mallampati: III  TM Distance: >3 FB Neck ROM: Full    Dental  (+) Dental Advisory Given   Pulmonary Current Smoker and Patient abstained from smoking.,    Pulmonary exam normal        Cardiovascular hypertension, Pt. on medications + Peripheral Vascular Disease  Normal cardiovascular exam+ pacemaker    '21 TTE - EF 50%. Moderately increased LVH. Grade I  diastolic dysfunction (impaired relaxation). Mild MR and AI. Trivial PR.     Neuro/Psych CVA, Residual Symptoms negative psych ROS   GI/Hepatic negative GI ROS, Neg liver ROS,   Endo/Other  diabetes, Insulin Dependent Hyperkalemia - improved to 5.6    Renal/GU CRFRenal disease     Musculoskeletal negative musculoskeletal ROS (+)   Abdominal   Peds  Hematology  (+) anemia ,  Thrombocytosis (Plt 711)    Anesthesia Other Findings Covid neg 12/31/19  Reproductive/Obstetrics                            Anesthesia Physical Anesthesia Plan  ASA: IV  Anesthesia Plan: General   Post-op Pain Management:    Induction: Intravenous  PONV Risk Score and Plan: 2 and Treatment may vary due to age or medical condition, Ondansetron and Midazolam  Airway Management Planned: Oral ETT  Additional Equipment: None  Intra-op Plan:   Post-operative Plan: Extubation in OR  Informed Consent: I have reviewed the patients History and Physical, chart, labs and discussed the procedure including the risks, benefits and alternatives for the proposed anesthesia with the patient or authorized representative who has indicated his/her understanding and acceptance.     Dental  advisory given  Plan Discussed with: CRNA and Anesthesiologist  Anesthesia Plan Comments: (Interpreter used for history and consent )       Anesthesia Quick Evaluation

## 2019-12-31 NOTE — ED Triage Notes (Signed)
Came in via EMS; c/o left leg pain; reported hx of stroke and has had weakness on that side since then.

## 2019-12-31 NOTE — Progress Notes (Signed)
Patient ID: Phillip Camacho, male   DOB: October 06, 1971, 49 y.o.   MRN: [id number] Agree with history and physical by Arlee Muslim, PA-C.  Very difficult patient with recent admission for recurrent stroke symptoms.  No source was found and patient was sent out on dual antiplatelet therapy.  He presents the emergency room today with complaints of his left foot.  He reports that over the past several weeks he has had progressive difficulty with pain and weakness.  He has fallen and abraded the toes on his third fourth and fifth toe.  He does have some motor and sensory function but this is markedly diminished.  He has no audible flow at the level of his ankle.  He has some audible flow at the popliteal level.  He has normal 2+ femoral pulses.  On the right leg he has 2+ femoral and faint popliteal pulse and Doppler flow that sounds biphasic at his right posterior tibial level.  I discussed this with the patient regarding his critical limb ischemia and that he is at high risk for limb loss.  He understands this.  We will go to surgery tonight for attempted limb salvage.  I am quite concerned that this will be unsuccessful due to apparent ongoing emboli.  Explained that we will begin with popliteal cutdown below the knee and hopefully can obtain inflow  His most recent creatinine is 4.5 and therefore further imaging would not be appropriate since this would put extremely high risk for complete renal failure.

## 2020-01-01 ENCOUNTER — Inpatient Hospital Stay (HOSPITAL_COMMUNITY): Payer: 59 | Admitting: Anesthesiology

## 2020-01-01 ENCOUNTER — Encounter (HOSPITAL_COMMUNITY)
Admission: EM | Disposition: A | Discharge status: Hospice | Payer: Self-pay | Source: Home / Self Care | Attending: Internal Medicine

## 2020-01-01 ENCOUNTER — Encounter (HOSPITAL_COMMUNITY): Payer: Self-pay | Admitting: Family Medicine

## 2020-01-01 DIAGNOSIS — I998 Other disorder of circulatory system: Secondary | ICD-10-CM

## 2020-01-01 HISTORY — PX: FEMORAL-POPLITEAL BYPASS GRAFT: SHX937

## 2020-01-01 LAB — PHOSPHORUS: Phosphorus: 7.1 mg/dL — ABNORMAL HIGH (ref 2.5–4.6)

## 2020-01-01 LAB — BASIC METABOLIC PANEL
Anion gap: 15 (ref 5–15)
BUN: 48 mg/dL — ABNORMAL HIGH (ref 6–20)
CO2: 18 mmol/L — ABNORMAL LOW (ref 22–32)
Calcium: 8 mg/dL — ABNORMAL LOW (ref 8.9–10.3)
Chloride: 110 mmol/L (ref 98–111)
Creatinine, Ser: 5.77 mg/dL — ABNORMAL HIGH (ref 0.61–1.24)
GFR calc Af Amer: 12 mL/min — ABNORMAL LOW (ref >60)
GFR calc non Af Amer: 11 mL/min — ABNORMAL LOW (ref >60)
Glucose, Bld: 110 mg/dL — ABNORMAL HIGH (ref 70–99)
Potassium: 4.9 mmol/L (ref 3.5–5.1)
Sodium: 143 mmol/L (ref 135–145)

## 2020-01-01 LAB — GLUCOSE, CAPILLARY
Glucose-Capillary: 103 mg/dL — ABNORMAL HIGH (ref 70–99)
Glucose-Capillary: 108 mg/dL — ABNORMAL HIGH (ref 70–99)
Glucose-Capillary: 122 mg/dL — ABNORMAL HIGH (ref 70–99)
Glucose-Capillary: 133 mg/dL — ABNORMAL HIGH (ref 70–99)
Glucose-Capillary: 135 mg/dL — ABNORMAL HIGH (ref 70–99)
Glucose-Capillary: 157 mg/dL — ABNORMAL HIGH (ref 70–99)
Glucose-Capillary: 90 mg/dL (ref 70–99)

## 2020-01-01 LAB — SURGICAL PCR SCREEN
MRSA, PCR: NEGATIVE
Staphylococcus aureus: POSITIVE — AB

## 2020-01-01 LAB — URINALYSIS, COMPLETE (UACMP) WITH MICROSCOPIC
Bilirubin Urine: NEGATIVE
Glucose, UA: 50 mg/dL — AB
Ketones, ur: 20 mg/dL — AB
Leukocytes,Ua: NEGATIVE
Nitrite: NEGATIVE
Protein, ur: 100 mg/dL — AB
Specific Gravity, Urine: 1.012 (ref 1.005–1.030)
pH: 5 (ref 5.0–8.0)

## 2020-01-01 LAB — CBC WITH DIFFERENTIAL/PLATELET
Abs Immature Granulocytes: 0.05 10*3/uL (ref 0.00–0.07)
Basophils Absolute: 0.1 10*3/uL (ref 0.0–0.1)
Basophils Relative: 1 %
Eosinophils Absolute: 0.6 10*3/uL — ABNORMAL HIGH (ref 0.0–0.5)
Eosinophils Relative: 5 %
HCT: 32.2 % — ABNORMAL LOW (ref 39.0–52.0)
Hemoglobin: 10.2 g/dL — ABNORMAL LOW (ref 13.0–17.0)
Immature Granulocytes: 0 %
Lymphocytes Relative: 21 %
Lymphs Abs: 2.7 10*3/uL (ref 0.7–4.0)
MCH: 28.3 pg (ref 26.0–34.0)
MCHC: 31.7 g/dL (ref 30.0–36.0)
MCV: 89.4 fL (ref 80.0–100.0)
Monocytes Absolute: 1 10*3/uL (ref 0.1–1.0)
Monocytes Relative: 8 %
Neutro Abs: 8.2 10*3/uL — ABNORMAL HIGH (ref 1.7–7.7)
Neutrophils Relative %: 65 %
Platelets: 711 10*3/uL — ABNORMAL HIGH (ref 150–400)
RBC: 3.6 MIL/uL — ABNORMAL LOW (ref 4.22–5.81)
RDW: 15.4 % (ref 11.5–15.5)
WBC: 12.6 10*3/uL — ABNORMAL HIGH (ref 4.0–10.5)
nRBC: 0 % (ref 0.0–0.2)

## 2020-01-01 LAB — IRON AND TIBC
Iron: 13 ug/dL — ABNORMAL LOW (ref 45–182)
Saturation Ratios: 6 % — ABNORMAL LOW (ref 17.9–39.5)
TIBC: 209 ug/dL — ABNORMAL LOW (ref 250–450)
UIBC: 196 ug/dL

## 2020-01-01 LAB — COMPREHENSIVE METABOLIC PANEL
ALT: 13 U/L (ref 0–44)
AST: 11 U/L — ABNORMAL LOW (ref 15–41)
Albumin: 2.4 g/dL — ABNORMAL LOW (ref 3.5–5.0)
Alkaline Phosphatase: 108 U/L (ref 38–126)
Anion gap: 11 (ref 5–15)
BUN: 50 mg/dL — ABNORMAL HIGH (ref 6–20)
CO2: 21 mmol/L — ABNORMAL LOW (ref 22–32)
Calcium: 8.9 mg/dL (ref 8.9–10.3)
Chloride: 111 mmol/L (ref 98–111)
Creatinine, Ser: 5.89 mg/dL — ABNORMAL HIGH (ref 0.61–1.24)
GFR calc Af Amer: 12 mL/min — ABNORMAL LOW (ref >60)
GFR calc non Af Amer: 10 mL/min — ABNORMAL LOW (ref >60)
Glucose, Bld: 120 mg/dL — ABNORMAL HIGH (ref 70–99)
Potassium: 5.6 mmol/L — ABNORMAL HIGH (ref 3.5–5.1)
Sodium: 143 mmol/L (ref 135–145)
Total Bilirubin: 0.5 mg/dL (ref 0.3–1.2)
Total Protein: 6.4 g/dL — ABNORMAL LOW (ref 6.5–8.1)

## 2020-01-01 LAB — CREATININE, URINE, RANDOM: Creatinine, Urine: 80.72 mg/dL

## 2020-01-01 LAB — CBC
HCT: 27.6 % — ABNORMAL LOW (ref 39.0–52.0)
Hemoglobin: 8.6 g/dL — ABNORMAL LOW (ref 13.0–17.0)
MCH: 27.9 pg (ref 26.0–34.0)
MCHC: 31.2 g/dL (ref 30.0–36.0)
MCV: 89.6 fL (ref 80.0–100.0)
Platelets: 587 10*3/uL — ABNORMAL HIGH (ref 150–400)
RBC: 3.08 MIL/uL — ABNORMAL LOW (ref 4.22–5.81)
RDW: 15.4 % (ref 11.5–15.5)
WBC: 13.7 10*3/uL — ABNORMAL HIGH (ref 4.0–10.5)
nRBC: 0 % (ref 0.0–0.2)

## 2020-01-01 LAB — TYPE AND SCREEN
ABO/RH(D): O POS
Antibody Screen: NEGATIVE

## 2020-01-01 LAB — POTASSIUM
Potassium: 5.9 mmol/L — ABNORMAL HIGH (ref 3.5–5.1)
Potassium: 5.2 mmol/L — ABNORMAL HIGH (ref 3.5–5.1)
Potassium: 5.4 mmol/L — ABNORMAL HIGH (ref 3.5–5.1)

## 2020-01-01 LAB — ANTITHROMBIN III: AntiThromb III Func: 127 % — ABNORMAL HIGH (ref 75–120)

## 2020-01-01 LAB — NA AND K (SODIUM & POTASSIUM), RAND UR
Potassium Urine: 19 mmol/L
Sodium, Ur: 68 mmol/L

## 2020-01-01 LAB — FERRITIN: Ferritin: 321 ng/mL (ref 24–336)

## 2020-01-01 SURGERY — BYPASS GRAFT FEMORAL-POPLITEAL ARTERY
Anesthesia: General | Site: Leg Lower

## 2020-01-01 MED ORDER — CEFAZOLIN SODIUM-DEXTROSE 2-4 GM/100ML-% IV SOLN
2.0000 g | Freq: Three times a day (TID) | INTRAVENOUS | Status: AC
  Administered 2020-01-01 – 2020-01-02 (×2): 2 g via INTRAVENOUS
  Filled 2020-01-01 (×2): qty 100

## 2020-01-01 MED ORDER — ROCURONIUM BROMIDE 10 MG/ML (PF) SYRINGE
PREFILLED_SYRINGE | INTRAVENOUS | Status: AC
  Filled 2020-01-01: qty 10

## 2020-01-01 MED ORDER — SODIUM CHLORIDE 0.9 % IV SOLN
510.0000 mg | Freq: Once | INTRAVENOUS | Status: AC
  Administered 2020-01-01: 510 mg via INTRAVENOUS
  Filled 2020-01-01: qty 17

## 2020-01-01 MED ORDER — LACTATED RINGERS IV SOLN: INTRAVENOUS | Status: DC | PRN

## 2020-01-01 MED ORDER — CHLORHEXIDINE GLUCONATE CLOTH 2 % EX PADS
6.0000 | MEDICATED_PAD | Freq: Every day | CUTANEOUS | Status: DC
  Administered 2020-01-03 – 2020-01-04 (×2): 6 via TOPICAL

## 2020-01-01 MED ORDER — MIDAZOLAM HCL 5 MG/5ML IJ SOLN
INTRAMUSCULAR | Status: DC | PRN
  Administered 2020-01-01: 1 mg via INTRAVENOUS

## 2020-01-01 MED ORDER — HEPARIN SODIUM (PORCINE) 1000 UNIT/ML IJ SOLN
INTRAMUSCULAR | Status: DC | PRN
  Administered 2020-01-01: 5000 [IU] via INTRAVENOUS

## 2020-01-01 MED ORDER — CEFAZOLIN SODIUM-DEXTROSE 2-3 GM-%(50ML) IV SOLR
INTRAVENOUS | Status: DC | PRN
  Administered 2020-01-01: 2 g via INTRAVENOUS

## 2020-01-01 MED ORDER — CEFAZOLIN SODIUM-DEXTROSE 2-4 GM/100ML-% IV SOLN
INTRAVENOUS | Status: AC
  Filled 2020-01-01: qty 100

## 2020-01-01 MED ORDER — OXYCODONE HCL 5 MG/5ML PO SOLN: 5.0000 mg | Freq: Once | ORAL | Status: DC | PRN

## 2020-01-01 MED ORDER — SUGAMMADEX SODIUM 200 MG/2ML IV SOLN
INTRAVENOUS | Status: DC | PRN
  Administered 2020-01-01: 110 mg via INTRAVENOUS

## 2020-01-01 MED ORDER — SODIUM CHLORIDE 0.9 % IV SOLN
INTRAVENOUS | Status: AC
  Filled 2020-01-01: qty 1.2

## 2020-01-01 MED ORDER — PHENYLEPHRINE HCL-NACL 10-0.9 MG/250ML-% IV SOLN
INTRAVENOUS | Status: DC | PRN
  Administered 2020-01-01: 30 ug/min via INTRAVENOUS

## 2020-01-01 MED ORDER — PROTAMINE SULFATE 10 MG/ML IV SOLN
INTRAVENOUS | Status: DC | PRN
  Administered 2020-01-01: 50 mg via INTRAVENOUS

## 2020-01-01 MED ORDER — HYDRALAZINE HCL 20 MG/ML IJ SOLN: 5.0000 mg | Freq: Four times a day (QID) | INTRAMUSCULAR | Status: DC | PRN

## 2020-01-01 MED ORDER — DEXAMETHASONE SODIUM PHOSPHATE 10 MG/ML IJ SOLN
INTRAMUSCULAR | Status: DC | PRN
  Administered 2020-01-01: 4 mg via INTRAVENOUS

## 2020-01-01 MED ORDER — OXYCODONE HCL 5 MG PO TABS: 5.0000 mg | ORAL_TABLET | Freq: Once | ORAL | Status: DC | PRN

## 2020-01-01 MED ORDER — CHLORHEXIDINE GLUCONATE CLOTH 2 % EX PADS
6.0000 | MEDICATED_PAD | Freq: Every day | CUTANEOUS | Status: DC
  Administered 2020-01-02 – 2020-01-04 (×3): 6 via TOPICAL

## 2020-01-01 MED ORDER — SODIUM CHLORIDE 0.9 % IV BOLUS
250.0000 mL | Freq: Once | INTRAVENOUS | Status: AC
  Administered 2020-01-01: 16:00:00 250 mL via INTRAVENOUS

## 2020-01-01 MED ORDER — FENTANYL CITRATE (PF) 250 MCG/5ML IJ SOLN
INTRAMUSCULAR | Status: AC
  Filled 2020-01-01: qty 5

## 2020-01-01 MED ORDER — PROPOFOL 10 MG/ML IV BOLUS
INTRAVENOUS | Status: AC
  Filled 2020-01-01: qty 20

## 2020-01-01 MED ORDER — ROCURONIUM BROMIDE 10 MG/ML (PF) SYRINGE
PREFILLED_SYRINGE | INTRAVENOUS | Status: DC | PRN
  Administered 2020-01-01: 50 mg via INTRAVENOUS

## 2020-01-01 MED ORDER — HEPARIN SODIUM (PORCINE) 1000 UNIT/ML IJ SOLN
INTRAMUSCULAR | Status: AC
  Filled 2020-01-01: qty 1

## 2020-01-01 MED ORDER — PHENYLEPHRINE HCL (PRESSORS) 10 MG/ML IV SOLN
INTRAVENOUS | Status: DC | PRN
  Administered 2020-01-01: 120 ug via INTRAVENOUS

## 2020-01-01 MED ORDER — HEPARIN SODIUM (PORCINE) 1000 UNIT/ML IJ SOLN
INTRAMUSCULAR | Status: AC
  Filled 2020-01-01: qty 2

## 2020-01-01 MED ORDER — SODIUM BICARBONATE 8.4 % IV SOLN
50.0000 meq | Freq: Once | INTRAVENOUS | Status: AC
  Administered 2020-01-01: 50 meq via INTRAVENOUS
  Filled 2020-01-01: qty 50

## 2020-01-01 MED ORDER — ETOMIDATE 2 MG/ML IV SOLN
INTRAVENOUS | Status: AC
  Filled 2020-01-01: qty 10

## 2020-01-01 MED ORDER — MIDAZOLAM HCL 2 MG/2ML IJ SOLN
INTRAMUSCULAR | Status: AC
  Filled 2020-01-01: qty 2

## 2020-01-01 MED ORDER — ONDANSETRON HCL 4 MG/2ML IJ SOLN: 4.0000 mg | Freq: Once | INTRAMUSCULAR | Status: DC | PRN

## 2020-01-01 MED ORDER — FENTANYL CITRATE (PF) 100 MCG/2ML IJ SOLN
INTRAMUSCULAR | Status: DC | PRN
  Administered 2020-01-01: 100 ug via INTRAVENOUS
  Administered 2020-01-01 (×3): 50 ug via INTRAVENOUS

## 2020-01-01 MED ORDER — LIDOCAINE 2% (20 MG/ML) 5 ML SYRINGE
INTRAMUSCULAR | Status: DC | PRN
  Administered 2020-01-01: 60 mg via INTRAVENOUS

## 2020-01-01 MED ORDER — MORPHINE SULFATE (PF) 2 MG/ML IV SOLN
2.0000 mg | INTRAVENOUS | Status: DC | PRN
  Administered 2020-01-01 – 2020-01-11 (×5): 2 mg via INTRAVENOUS
  Filled 2020-01-01 (×5): qty 1

## 2020-01-01 MED ORDER — OXYCODONE-ACETAMINOPHEN 5-325 MG PO TABS
1.0000 | ORAL_TABLET | ORAL | Status: DC | PRN
  Administered 2020-01-02 – 2020-01-05 (×12): 2 via ORAL
  Filled 2020-01-01 (×12): qty 2

## 2020-01-01 MED ORDER — PROPOFOL 10 MG/ML IV BOLUS
INTRAVENOUS | Status: DC | PRN
  Administered 2020-01-01: 100 mg via INTRAVENOUS

## 2020-01-01 MED ORDER — PROTAMINE SULFATE 10 MG/ML IV SOLN
INTRAVENOUS | Status: AC
  Filled 2020-01-01: qty 5

## 2020-01-01 MED ORDER — ONDANSETRON HCL 4 MG/2ML IJ SOLN
INTRAMUSCULAR | Status: DC | PRN
  Administered 2020-01-01: 4 mg via INTRAVENOUS

## 2020-01-01 MED ORDER — FENTANYL CITRATE (PF) 100 MCG/2ML IJ SOLN: 25.0000 ug | INTRAMUSCULAR | Status: DC | PRN

## 2020-01-01 SURGICAL SUPPLY — 49 items
ADH SKN CLS APL DERMABOND .7 (GAUZE/BANDAGES/DRESSINGS) ×4
BANDAGE ESMARK 6X9 LF (GAUZE/BANDAGES/DRESSINGS) IMPLANT
BNDG CMPR 9X6 STRL LF SNTH (GAUZE/BANDAGES/DRESSINGS)
BNDG ESMARK 6X9 LF (GAUZE/BANDAGES/DRESSINGS)
CANISTER SUCT 3000ML PPV (MISCELLANEOUS) ×3 IMPLANT
CANNULA VESSEL 3MM 2 BLNT TIP (CANNULA) ×6 IMPLANT
CATH EMB 4FR 80CM (CATHETERS) ×2 IMPLANT
CLIP LIGATING EXTRA MED SLVR (CLIP) ×3 IMPLANT
CLIP LIGATING EXTRA SM BLUE (MISCELLANEOUS) ×3 IMPLANT
COVER WAND RF STERILE (DRAPES) ×1 IMPLANT
CUFF TOURN SGL QUICK 34 (TOURNIQUET CUFF)
CUFF TOURN SGL QUICK 42 (TOURNIQUET CUFF) IMPLANT
CUFF TRNQT CYL 34X4.125X (TOURNIQUET CUFF) IMPLANT
DERMABOND ADVANCED (GAUZE/BANDAGES/DRESSINGS) ×8
DERMABOND ADVANCED .7 DNX12 (GAUZE/BANDAGES/DRESSINGS) ×1 IMPLANT
DRAIN SNY 10X20 3/4 PERF (WOUND CARE) IMPLANT
DRAPE HALF SHEET 40X57 (DRAPES) IMPLANT
DRAPE X-RAY CASS 24X20 (DRAPES) IMPLANT
ELECT REM PT RETURN 9FT ADLT (ELECTROSURGICAL) ×3
ELECTRODE REM PT RTRN 9FT ADLT (ELECTROSURGICAL) ×1 IMPLANT
EVACUATOR SILICONE 100CC (DRAIN) IMPLANT
GLOVE SS BIOGEL STRL SZ 7.5 (GLOVE) ×1 IMPLANT
GLOVE SUPERSENSE BIOGEL SZ 7.5 (GLOVE) ×2
GOWN STRL REUS W/ TWL LRG LVL3 (GOWN DISPOSABLE) ×3 IMPLANT
GOWN STRL REUS W/TWL LRG LVL3 (GOWN DISPOSABLE) ×9
INSERT FOGARTY SM (MISCELLANEOUS) IMPLANT
KIT BASIN OR (CUSTOM PROCEDURE TRAY) ×3 IMPLANT
KIT TURNOVER KIT B (KITS) ×3 IMPLANT
NS IRRIG 1000ML POUR BTL (IV SOLUTION) ×6 IMPLANT
PACK PERIPHERAL VASCULAR (CUSTOM PROCEDURE TRAY) ×3 IMPLANT
PAD ARMBOARD 7.5X6 YLW CONV (MISCELLANEOUS) ×6 IMPLANT
PADDING CAST COTTON 6X4 STRL (CAST SUPPLIES) IMPLANT
SET COLLECT BLD 21X3/4 12 (NEEDLE) IMPLANT
STOPCOCK 4 WAY LG BORE MALE ST (IV SETS) IMPLANT
SUT ETHILON 3 0 PS 1 (SUTURE) IMPLANT
SUT PROLENE 5 0 C 1 24 (SUTURE) ×3 IMPLANT
SUT PROLENE 6 0 CC (SUTURE) ×7 IMPLANT
SUT SILK 2 0 SH (SUTURE) ×5 IMPLANT
SUT SILK 3 0 (SUTURE) ×3
SUT SILK 3-0 18XBRD TIE 12 (SUTURE) IMPLANT
SUT VIC AB 2-0 CTX 36 (SUTURE) ×6 IMPLANT
SUT VIC AB 3-0 SH 27 (SUTURE) ×12
SUT VIC AB 3-0 SH 27X BRD (SUTURE) ×2 IMPLANT
SYR 3ML LL SCALE MARK (SYRINGE) ×2 IMPLANT
TOWEL GREEN STERILE (TOWEL DISPOSABLE) ×3 IMPLANT
TRAY FOLEY MTR SLVR 16FR STAT (SET/KITS/TRAYS/PACK) ×3 IMPLANT
TUBING EXTENTION W/L.L. (IV SETS) IMPLANT
UNDERPAD 30X30 (UNDERPADS AND DIAPERS) ×3 IMPLANT
WATER STERILE IRR 1000ML POUR (IV SOLUTION) ×3 IMPLANT

## 2020-01-01 NOTE — Progress Notes (Signed)
PROGRESS NOTE    Phillip Camacho  XKG:[id number] DOB: Jan 04, 1971 DOA: 12/31/2019 PCP: Phillip Blackbird, MD   Brief Narrative: As per admission HPI: 49 y.o. male with medical history significant for insulin-dependent diabetes mellitus, hypertension, chronic Camacho disease stage IV, and recurrent CVAs, likely embolic, now presenting to the emergency department with left foot pain.  History is limited by the patient's cognitive deficits and language barrier despite use of video interpreter Phillip Camacho, [phone]).  Patient reports that his left foot began to ache today though his son, Phillip Camacho, who is also his caretaker reports that the patient has been complaining of left leg pain for weeks. Patient has not been coughing and has not appeared short of breath.  He denies any chest pain or fevers. Since returning from the hospital after recurrent stroke in January 2021, the patient has been confused, suffering increased weakness, mainly on his left side, and has not been eating or drinking much at all despite encouragement from his son. He requires close supervision and help with ADLs at home.    ED Course: Upon arrival to the ED, patient is found to be afebrile, saturating well on room air, stable blood pressure.  EKG features sinus rhythm with LVH and repolarization abnormality.  Chemistry panel notable for potassium 6.6, bicarbonate 17, BUN 50, and creatinine 5.69, up from 4.8 in January 2021.  CBC notable for leukocytosis to 14,500, stable normocytic anemia, and increased thrombocytosis with platelets of 748,000.  INR is normal.  Covid and influenza PCR are negative.  Type and screen was performed in the ED and the patient was treated with insulin/dextrose, bicarbonate, and Lokelma.  He was also given a heparin bolus.  Vascular surgery was consulted by the ED physician admitted plans to attempt revascularization in the morning if hyperkalemia can be stabilized.  Nephrology was also consulted by the ED physician.   Hospitalist were asked to admit for medical management and optimization for surgery.  Subjective:  Seen and examined.  Patient is back from ER this afternoon.  Somewhat drowsy denies any pain.   Assessment & Plan:  Critical ischemia of left lower extremity:  S/p Left femoral to below-knee popliteal bypass with reverse great saphenous vein 3/11.  Appreciate vascular input, monitor distal pulses.  Severe Hyperkalemia: In the setting of AKI.  After temporizing measures/Lokelma, some has improved as below.  Continue Lokelma daily, along with bicarb and monitor renal functions. Further plan as per nephrology Recent Labs  Lab 12/31/19 1620 12/31/19 1743 01/01/20 0251  K 6.6* 6.4* 5.6*   Acute renal failure superimposed on stage 4 chronic Camacho disease versus progressive CKD due to poorly controlled diabetes and hypertension: nephro on board, work-up negative so far with negative HIV, hep C, hep B.  Holding home losartan.  Continue plan as per nephrology.Continue to monitor renal functions, urine output. Foley in place.  Nephrotic syndrome presented to diabetic nephropathy.  Monitor.  Recent Labs  Lab 12/31/19 1620 01/01/20 0251  BUN 50* 50*  CREATININE 5.69* 5.89*   Insulin-requiring or dependent type II diabetes mellitus, hemoglobin A1c is poorly controlled 8.6 on 1/23.  Blood sugar fairly stable on sliding scale insulin.  History recurrent CVAs:Patient was admitted in January with recurrent CVA, likely embolic, has had increased weakness on the left and cognitive deficits since then. Cont home Brilinta, aspirin and Lipitor.    Hypertension on Norvasc.  HLD on statins.  Tobacco use- will need to advise cessation.  Anemia of CKD-monitor hb  Social issues:Patient lives with  his son who works during the day and has had difficulty caring for the patient who has been frequently confused since the recent strokes and is often unable to perform basic ADLs on his own.  Likely need to look  into skilled nursing facility.   Body mass index is 19.3 kg/m.  DVT prophylaxis:Heparin Code Status:full  Family Communication: plan of care discussed with patient at bedside. Disposition Plan: Patient is from: home Anticipated Disposition:likely SNF Barriers to discharge or conditions that needs to be met prior to discharge: remians hospitalized for critical ischemia, hyperkalemia, AKI on CKD for close monitoring of renal function and further stabilization.  Consultants: Nephrology, vascular surgery.   Procedures:  3/11: Left femoral to below-knee popliteal bypass with reverse great saphenous vein  Microbiology:see note  Medications: Scheduled Meds: . amLODipine  10 mg Oral Daily  . aspirin EC  81 mg Oral Daily  . atorvastatin  80 mg Oral q1800  . Chlorhexidine Gluconate Cloth  6 each Topical Daily  . heparin  5,000 Units Subcutaneous Q8H  . insulin aspart  0-9 Units Subcutaneous Q4H  . sodium bicarbonate  1,300 mg Oral BID  . sodium chloride flush  3 mL Intravenous Q12H  . sodium zirconium cyclosilicate  10 g Oral Daily  . ticagrelor  90 mg Oral BID   Continuous Infusions: . ceFAZolin    .  ceFAZolin (ANCEF) IV      Antimicrobials: Anti-infectives (From admission, onward)   Start     Dose/Rate Route Frequency Ordered Stop   01/01/20 1600  ceFAZolin (ANCEF) IVPB 2g/100 mL premix     2 g 200 mL/hr over 30 Minutes Intravenous Every 8 hours 01/01/20 1224 01/02/20 0759   01/01/20 0706  ceFAZolin (ANCEF) 2-4 GM/100ML-% IVPB    Note to Pharmacy: Lelon Perla   : cabinet override      01/01/20 0706 01/01/20 1914   12/31/19 1745  ceFAZolin (ANCEF) IVPB 2g/100 mL premix     2 g 200 mL/hr over 30 Minutes Intravenous On call to O.R. 12/31/19 1730 01/01/20 0559       Objective: Vitals: Today's Vitals   01/01/20 1130 01/01/20 1145 01/01/20 1200 01/01/20 1230  BP: (!) 159/82 (!) 166/68 (!) 144/93 (!) 176/74  Pulse: 92 91 93 99  Resp: 15 11 15 16   Temp: 97.8 F (36.6  C)  98 F (36.7 C) 98 F (36.7 C)  TempSrc:    Oral  SpO2: 100% 100% 100% 97%  Weight:      Height:      PainSc:    0-No pain    Intake/Output Summary (Last 24 hours) at 01/01/2020 1324 Last data filed at 01/01/2020 1120 Gross per 24 hour  Intake 1701.25 ml  Output 1250 ml  Net 451.25 ml   Filed Weights   12/31/19 2032  Weight: 52.6 kg   Weight change:    Intake/Output from previous day: 03/10 0701 - 03/11 0700 In: 501.3 [I.V.:1.3; IV Piggyback:500] Out: -  Intake/Output this shift: Total I/O In: 1200 [I.V.:1200] Out: 1250 [Urine:1150; Blood:100]  Examination:  General exam: Drowsy, able to wake up and interact, not in distress. HEENT:Oral mucosa moist, Ear/Nose WNL grossly,dentition normal. Respiratory system: bilaterally clear,no wheezing or crackles,no use of accessory muscle, non tender. Cardiovascular system: S1 & S2 +, regular, No JVD. Gastrointestinal system: Abdomen soft, NT,ND, BS+. Nervous System:Alert, awake, moving extremities, has weakness in the left from previous stroke. Extremities: No edema, distal peripheral pulses palpable.  Skin: No rashes,no icterus. MSK:  Normal muscle bulk,tone, power Foley+  Data Reviewed: I have personally reviewed following labs and imaging studies CBC: Recent Labs  Lab 12/31/19 1530 01/01/20 0251  WBC 14.5* 12.6*  NEUTROABS 10.4* 8.2*  HGB 10.6* 10.2*  HCT 35.0* 32.2*  MCV 91.6 89.4  PLT 748* 357*   Basic Metabolic Panel: Recent Labs  Lab 12/31/19 1620 12/31/19 1743 01/01/20 0251 01/01/20 0311  NA 138  --  143  --   K 6.6* 6.4* 5.6*  --   CL 109  --  111  --   CO2 17*  --  21*  --   GLUCOSE 119*  --  120*  --   BUN 50*  --  50*  --   CREATININE 5.69*  --  5.89*  --   CALCIUM 8.9  --  8.9  --   PHOS  --   --   --  7.1*   GFR: Estimated Creatinine Clearance: 11.4 mL/min (A) (by C-G formula based on SCr of 5.89 mg/dL (H)). Liver Function Tests: Recent Labs  Lab 01/01/20 0251  AST 11*  ALT 13   ALKPHOS 108  BILITOT 0.5  PROT 6.4*  ALBUMIN 2.4*   No results for input(s): LIPASE, AMYLASE in the last 168 hours. No results for input(s): AMMONIA in the last 168 hours. Coagulation Profile: Recent Labs  Lab 12/31/19 1530  INR 1.0   Cardiac Enzymes: No results for input(s): CKTOTAL, CKMB, CKMBINDEX, TROPONINI in the last 168 hours. BNP (last 3 results) No results for input(s): PROBNP in the last 8760 hours. HbA1C: No results for input(s): HGBA1C in the last 72 hours. CBG: Recent Labs  Lab 12/31/19 2350 01/01/20 0443 01/01/20 1138 01/01/20 1221  GLUCAP 103* 108* 133* 135*   Lipid Profile: No results for input(s): CHOL, HDL, LDLCALC, TRIG, CHOLHDL, LDLDIRECT in the last 72 hours. Thyroid Function Tests: No results for input(s): TSH, T4TOTAL, FREET4, T3FREE, THYROIDAB in the last 72 hours. Anemia Panel: Recent Labs    01/01/20 0251  FERRITIN 321  TIBC 209*  IRON 13*   Sepsis Labs: No results for input(s): PROCALCITON, LATICACIDVEN in the last 168 hours.  Recent Results (from the past 240 hour(s))  Respiratory Panel by RT PCR (Flu A&B, Covid) - Nasopharyngeal Swab     Status: None   Collection Time: 12/31/19  5:23 PM   Specimen: Nasopharyngeal Swab  Result Value Ref Range Status   SARS Coronavirus 2 by RT PCR NEGATIVE NEGATIVE Final    Comment: (NOTE) SARS-CoV-2 target nucleic acids are NOT DETECTED. The SARS-CoV-2 RNA is generally detectable in upper respiratoy specimens during the acute phase of infection. The lowest concentration of SARS-CoV-2 viral copies this assay can detect is 131 copies/mL. A negative result does not preclude SARS-Cov-2 infection and should not be used as the sole basis for treatment or other patient management decisions. A negative result may occur with  improper specimen collection/handling, submission of specimen other than nasopharyngeal swab, presence of viral mutation(s) within the areas targeted by this assay, and inadequate  number of viral copies (<131 copies/mL). A negative result must be combined with clinical observations, patient history, and epidemiological information. The expected result is Negative. Fact Sheet for Patients:  PinkCheek.be Fact Sheet for Healthcare Providers:  GravelBags.it This test is not yet ap proved or cleared by the [place] FDA and  has been authorized for detection and/or diagnosis of SARS-CoV-2 by FDA under an Emergency Use Authorization (EUA). This EUA will remain  in effect (meaning  this test can be used) for the duration of the COVID-19 declaration under Section 564(b)(1) of the Act, 21 U.S.C. section 360bbb-3(b)(1), unless the authorization is terminated or revoked sooner.    Influenza A by PCR NEGATIVE NEGATIVE Final   Influenza B by PCR NEGATIVE NEGATIVE Final    Comment: (NOTE) The Xpert Xpress SARS-CoV-2/FLU/RSV assay is intended as an aid in  the diagnosis of influenza from Nasopharyngeal swab specimens and  should not be used as a sole basis for treatment. Nasal washings and  aspirates are unacceptable for Xpert Xpress SARS-CoV-2/FLU/RSV  testing. Fact Sheet for Patients: PinkCheek.be Fact Sheet for Healthcare Providers: GravelBags.it This test is not yet approved or cleared by the [place] FDA and  has been authorized for detection and/or diagnosis of SARS-CoV-2 by  FDA under an Emergency Use Authorization (EUA). This EUA will remain  in effect (meaning this test can be used) for the duration of the  Covid-19 declaration under Section 564(b)(1) of the Act, 21  U.S.C. section 360bbb-3(b)(1), unless the authorization is  terminated or revoked. Performed at [facility], [place] [address], [place], [place] [address]   Surgical pcr screen     Status: Abnormal   Collection Time: 01/01/20  4:27 AM   Specimen: Nasal Mucosa; Nasal Swab   Result Value Ref Range Status   MRSA, PCR NEGATIVE NEGATIVE Final   Staphylococcus aureus POSITIVE (A) NEGATIVE Final    Comment: (NOTE) The Xpert SA Assay (FDA approved for NASAL specimens in patients 7 years of age and older), is one component of a comprehensive surveillance program. It is not intended to diagnose infection nor to guide or monitor treatment. Performed at [facility], [place] [address], [place], [place] [address]       Radiology Studies: No results found.   LOS: 1 day   Time spent: More than 50% of that time was spent in counseling and/or coordination of care.  Antonieta Pert, MD [facility]  01/01/2020, 1:24 PM

## 2020-01-01 NOTE — Progress Notes (Signed)
Pt BP 174/85 (109). MD notified and order for PRN hydralazine ordered for SBP 180. Will continue to monitor.  Clyde Canterbury, RN

## 2020-01-01 NOTE — Progress Notes (Signed)
  Progress Note    01/01/2020 1:52 PM Day of Surgery  Subjective:  Sleeping when I entered room. Used translator to assist in talking with patient but he was very drowsy post operatively with limited interaction with translator or myself. No complaints   Vitals:   01/01/20 1200 01/01/20 1230  BP: (!) 144/93 (!) 176/74  Pulse: 93 99  Resp: 15 16  Temp: 98 F (36.7 C) 98 F (36.7 C)  SpO2: 100% 97%   Physical Exam: General: very drowsy, not in any distress Incisions:  Left lower extremity incisions intact, clean and dry, no drainage, no hematoma Extremities:  2+ left femoral pulse, Doppler PT/DP signals, left leg and foot warm, motor and sensation intact  CBC    Component Value Date/Time   WBC 12.6 (H) 01/01/2020 0251   RBC 3.60 (L) 01/01/2020 0251   HGB 10.2 (L) 01/01/2020 0251   HGB 11.3 (L) 11/12/2019 1616   HCT 32.2 (L) 01/01/2020 0251   HCT 34.1 (L) 11/12/2019 1616   PLT 711 (H) 01/01/2020 0251   PLT 527 (H) 11/12/2019 1616   MCV 89.4 01/01/2020 0251   MCV 84 11/12/2019 1616   MCH 28.3 01/01/2020 0251   MCHC 31.7 01/01/2020 0251   RDW 15.4 01/01/2020 0251   RDW 14.5 11/12/2019 1616   LYMPHSABS 2.7 01/01/2020 0251   MONOABS 1.0 01/01/2020 0251   EOSABS 0.6 (H) 01/01/2020 0251   BASOSABS 0.1 01/01/2020 0251    BMET    Component Value Date/Time   NA 143 01/01/2020 0251   NA 139 11/12/2019 1616   K 5.9 (H) 01/01/2020 1225   CL 111 01/01/2020 0251   CO2 21 (L) 01/01/2020 0251   GLUCOSE 120 (H) 01/01/2020 0251   BUN 50 (H) 01/01/2020 0251   BUN 34 (H) 11/12/2019 1616   CREATININE 5.89 (H) 01/01/2020 0251   CALCIUM 8.9 01/01/2020 0251   GFRNONAA 10 (L) 01/01/2020 0251   GFRAA 12 (L) 01/01/2020 0251    INR    Component Value Date/Time   INR 1.0 12/31/2019 1530     Intake/Output Summary (Last 24 hours) at 01/01/2020 1352 Last data filed at 01/01/2020 1120 Gross per 24 hour  Intake 1701.25 ml  Output 1250 ml  Net 451.25 ml     Assessment/Plan:   49 y.o. male is s/p left femoral to below knee popliteal bypass with reverse great saphenous vein Day of Surgery. Adequate perfusion of left lower extremity with Doppler DP, PT signals. Left leg warm. Will continue to closely monitor lower extremity perfusion. PT/OT eval with increased mobilization tomorrow. Pain control. Continue routine post op care  DVT prophylaxis:  SQ Heparin   Karoline Caldwell, PA-C [facility] [phone] 01/01/2020 1:52 PM

## 2020-01-01 NOTE — Anesthesia Procedure Notes (Signed)
Procedure Name: Intubation Date/Time: 01/01/2020 8:18 AM Performed by: Kyung Rudd, CRNA Pre-anesthesia Checklist: Patient identified, Emergency Drugs available, Suction available and Patient being monitored Patient Re-evaluated:Patient Re-evaluated prior to induction Oxygen Delivery Method: Circle System Utilized Preoxygenation: Pre-oxygenation with 100% oxygen Induction Type: IV induction Ventilation: Mask ventilation without difficulty Laryngoscope Size: Mac and 3 Grade View: Grade I Tube type: Oral Tube size: 7.5 mm Number of attempts: 1 Airway Equipment and Method: Stylet and Oral airway Placement Confirmation: ETT inserted through vocal cords under direct vision,  positive ETCO2 and breath sounds checked- equal and bilateral Secured at: 22 cm Tube secured with: Tape Dental Injury: Teeth and Oropharynx as per pre-operative assessment

## 2020-01-01 NOTE — Anesthesia Postprocedure Evaluation (Signed)
Anesthesia Post Note  Patient: Phillip Camacho  Procedure(s) Performed: LEFT LEG  FEMORAL TO BELOW KNEE POPLITEAL BYPASS (N/A Leg Lower)     Patient location during evaluation: PACU Anesthesia Type: General Level of consciousness: awake and alert Pain management: pain level controlled Vital Signs Assessment: post-procedure vital signs reviewed and stable Respiratory status: spontaneous breathing, nonlabored ventilation and respiratory function stable Cardiovascular status: blood pressure returned to baseline and stable Postop Assessment: no apparent nausea or vomiting Anesthetic complications: no    Last Vitals:  Vitals:   01/01/20 1200 01/01/20 1230  BP: (!) 144/93 (!) 176/74  Pulse: 93 99  Resp: 15 16  Temp: 36.7 C 36.7 C  SpO2: 100% 97%    Last Pain:  Vitals:   01/01/20 1230  TempSrc: Oral  PainSc: 0-No pain                 Audry Pili

## 2020-01-01 NOTE — Op Note (Signed)
    OPERATIVE REPORT  DATE OF SURGERY: 01/01/2020  PATIENT: Phillip Camacho, 49 y.o. male MRN: [id number]  DOB: 06-16-1971  PRE-OPERATIVE DIAGNOSIS: Critical limb ischemia left leg  POST-OPERATIVE DIAGNOSIS:  Same  PROCEDURE: Left femoral to below-knee popliteal bypass with reverse great saphenous vein  SURGEON:  Curt Jews, M.D.  PHYSICIAN ASSISTANT: Matt Eveland, PA-C  ANESTHESIA: General  EBL: per anesthesia record  Total I/O In: 1200 [I.V.:1200] Out: 1250 [Urine:1150; Blood:100]  BLOOD ADMINISTERED: none  DRAINS: none  SPECIMEN: none  COUNTS CORRECT:  YES  PATIENT DISPOSITION:  PACU - hemodynamically stable  PROCEDURE DETAILS: Patient was taken the operating placed supine straight area of the left groin left leg prepped draped you sterile fashion.  There was some question that this could have been embolic and therefore incision was made in the medial approach to the below-knee popliteal artery.  The artery was extensively calcified and appeared to have great deal of chronic disease.  The patient was given 5000 units of intravenous heparin after adequate circulation time the popliteal artery was occluded proximally and the anterior tibial and tibioperoneal trunk were occluded distally with Vesseloops.  The popliteal artery was opened transversely and was chronically occluded.  The arteriotomy was continued down to the junction of the anterior tibial and tibioperoneal trunk and the artery did have a lumen at the space with slight backbleeding.  The saphenous vein had been marked and was a good caliber throughout its course.  The saphenous vein was harvested from the below-knee position to the groin using multiple skin bridges.  The common, superficial and functionless arteries were encircled with Vesseloops.  The common femoral superficial femoral profunda femoris arteries were occluded and arteriotomy was extended with Potts scissors.  The saphenous vein was brought onto the  field and was placed in reverse position.  The vein was spatulated and sewn end-to-side to the artery with a running 6-0 Prolene suture.  After the anastomosis completed flow was restored through the graft and there was excellent flow.  The vein was brought through the tunnel in anatomic position down to the level of the below-knee popliteal artery.  The graft was cut to the appropriate length and was spatulated and was sewn at the junction of the anterior tibial and tibioperoneal trunk with a 5-0 Prolene suture.  The usual flushing maneuvers were undertaken.  Anastomosis completed and flow was restored to the foot.  There was good flow in the tibioperoneal trunk with Doppler.  The patient did have peroneal signal at the foot.  Patient was given 50 mg of protamine reverse heparin.  Wounds irrigated with saline.  The popliteal and groin incision were closed with 2-0 Vicryl in the fascia and the skin was closed with 3-0 subcuticular Vicryl.  The skin vein harvest incisions were closed with 3-0 Vicryl suture.  Sterile dressing was applied and the patient was transferred to the recovery in stable condition   [name], M.D., [facility] 01/01/2020 11:31 AM

## 2020-01-01 NOTE — Progress Notes (Signed)
Pt arrived from PACU. VSS. Telemetry applied, CHG completed, pulses dopplerable. Assessment performed. Son updated.   Lavenia Atlas, RN

## 2020-01-01 NOTE — Progress Notes (Signed)
49 year old male underwent a left femoral to below-knee pop bypass today for critical limb ischemia with Dr. Donnetta Hutching.  Nephrology called this afternoon stating that he is likely going to require dialysis soon and have requested placement of a tunneled dialysis catheter tomorrow.  I have discussed with the patient through interpreter as well as with his son plan to proceed with tunneled dialysis catheter tomorrow.  We will keep the patient n.p.o. after midnight.  Consent order placed in the chart.  Marty Heck, MD [facility] [place] Office: [place]

## 2020-01-01 NOTE — Progress Notes (Signed)
[facility] Progress Note  Name: TU BAYLE MRN: [id number] DOB: December 07, 1970  Chief Complaint:  Foot pain   Subjective:  s/p left femoral to below knee popliteal bypass with reverse great saphenous vein.  Spoke with patient via [place]et[place] interpreter and he stated that he would be ok with starting dialysis tomorrow as his CKD appears to have progressed.  He is more alert now than earlier this afternoon per his nurse.  Had 1.2 liters UOP charted thus far today.  Spoke with his son as well given that his dad has just had surgery this AM and he confirms that they have previously discussed dialysis and confirmed that his dad does want it.  Pt has felt poorly for a several months  Review of systems:  Has been NPO Urinating some via foley; was emptied in OR per nurse Denies shortness of breath    ------------ Background on consult:  Mr. 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  He had also fallen with abrasions to his toes on his 3rd-5th toes on his left foot. In the ED, he was found to have a cold left foot without audible flow at the level of his ankle and was set to have emergent thrombectomy to attempt limb salvage, however labs were notable for a K of 6.6, CO2 17, and BUN/Cr of 50/5.69.   EKG was without peaked t-waves and his hyperkalemia was treated in the ED with IV insulin/D5, IV bicarb, and lokelma.  His surgery has been postponed due to his electrolyte abnormalities as well as his worsening renal function.  He was initially evaluated by Dr. [name] at [facility] on 09/17/19 and was noted to have nephrotic  syndrome (urine protein of 7.6 grams/24 hours presumably due to his poorly controlled DM).  He was seen by our practice by Dr. Joelyn Oms on 11/15/19 during his most recent hospitalization for another ischemic CVA.  He has not followed up with Dr. [name] or our office since his discharge.    Intake/Output Summary (Last 24 hours) at 01/01/2020 1427 Last data filed at 01/01/2020 1120 Gross per 24 hour  Intake 1701.25 ml  Output 1250 ml  Net 451.25 ml    Vitals:  Vitals:   01/01/20 1130 01/01/20 1145 01/01/20 1200 01/01/20 1230  BP: (!) 159/82 (!) 166/68 (!) 144/93 (!) 176/74  Pulse: 92 91 93 99  Resp: 15 11 15 16   Temp: 97.8 F (36.6 C)  98 F (36.7 C) 98 F (36.7 C)  TempSrc:    Oral  SpO2: 100% 100% 100% 97%  Weight:      Height:         Physical Exam:  General adult male in bed in mild discomfort post-op HEENT normocephalic atraumatic extraocular movements intact sclera anicteric Neck supple trachea midline Lungs clear to auscultation bilaterally normal work of breathing at rest  Heart S1S2 no rub Abdomen soft nontender nondistended Extremities no edema; left leg with surgical incision approximated GU  Foley in place with small amount of urine Neuro awake and conversant - oriented to name, location, time  Medications reviewed    Labs:  BMP Latest Ref Rng & Units 01/01/2020  01/01/2020 12/31/2019  Glucose 70 - 99 mg/dL - 120(H) -  BUN 6 - 20 mg/dL - 50(H) -  Creatinine 0.61 - 1.24 mg/dL - 5.89(H) -  BUN/Creat Ratio 9 - 20 - - -  Sodium 135 - 145 mmol/L - 143 -  Potassium 3.5 - 5.1 mmol/L 5.9(H) 5.6(H) 6.4(HH)  Chloride 98 - 111 mmol/L - 111 -  CO2 22 - 32 mmol/L - 21(L) -  Calcium 8.9 - 10.3 mg/dL - 8.9 -     Assessment/Plan:   1.  Hyperkalemia- presumably due to AKI/CKD vs progressive CKD. S/p lokelma pre-op.  normal saline bolus and bicarbonate once now.  Repeat K as 5.9 was hemolyzed  2. AKI/CKD stage 4 vs progressive CKD due to poorly controlled DM and HTN.   Workup thus far revealed negative HIV, hep C, and hepatitis B. He had been started on losartan but developed hyperkalemia and it was discontinued.   1. Plan for initiation of HD tomorrow.  2. NPO after midnight tonight  3. Have paged Vascular to discuss access placement for HD 3. Nephrotic syndrome- presumably due to diabetic nephropathy but is significant amount of proteinuria.  Given the chronicity and severity of his CKD, a renal biopsy would not be helpful at this point. 4. Critical ischemia of left foot- s/p left femoral to below knee popliteal bypass with reverse great saphenous vein on 3/11 with vascular   5. Recurrent ischemic CVA's- possibly embolic.  May be due to hypercoagulable state from his nephrotic syndrome or another cause.  Note a hypercoagulable panel was ordered per nephrology 6. Anemia of CKD stage 4-  Iron deficiency.  Feraheme x 1 dose on 3/11. 7. HTN- poorly controlled outpatient.  He was ordered for amlodipine but didn't receive today - nurse is getting it to him now  8. HLD- on statin 9. Tobacco use- nicotine patch per primary svc 10. IDDM- poorly controlled.  Per primary svc   Claudia Desanctis, MD 01/01/2020 3:10 PM

## 2020-01-01 NOTE — Transfer of Care (Signed)
Immediate Anesthesia Transfer of Care Note  Patient: LEODIS ALCOCER  Procedure(s) Performed: LEFT LEG  FEMORAL TO BELOW KNEE POPLITEAL BYPASS (N/A Leg Lower)  Patient Location: PACU  Anesthesia Type:General  Level of Consciousness: awake, alert  and oriented  Airway & Oxygen Therapy: Patient Spontanous Breathing and Patient connected to nasal cannula oxygen  Post-op Assessment: Report given to RN and Post -op Vital signs reviewed and stable  Post vital signs: Reviewed and stable  Last Vitals:  Vitals Value Taken Time  BP 159/82 01/01/20 1130  Temp    Pulse 93 01/01/20 1132  Resp 17 01/01/20 1131  SpO2 100 % 01/01/20 1132  Vitals shown include unvalidated device data.  Last Pain:  Vitals:   01/01/20 0445  TempSrc: Oral  PainSc:          Complications: No apparent anesthesia complications

## 2020-01-02 ENCOUNTER — Encounter (HOSPITAL_COMMUNITY)
Admission: EM | Disposition: A | Discharge status: Hospice | Payer: Self-pay | Source: Home / Self Care | Attending: Internal Medicine

## 2020-01-02 ENCOUNTER — Inpatient Hospital Stay (HOSPITAL_COMMUNITY): Payer: 59

## 2020-01-02 ENCOUNTER — Encounter (HOSPITAL_COMMUNITY): Payer: 59

## 2020-01-02 ENCOUNTER — Inpatient Hospital Stay (HOSPITAL_COMMUNITY): Payer: 59 | Admitting: Anesthesiology

## 2020-01-02 DIAGNOSIS — N185 Chronic kidney disease, stage 5: Secondary | ICD-10-CM

## 2020-01-02 HISTORY — PX: INSERTION OF DIALYSIS CATHETER: SHX1324

## 2020-01-02 LAB — HEPATITIS B SURFACE ANTIBODY,QUALITATIVE: Hep B S Ab: NONREACTIVE

## 2020-01-02 LAB — CARDIOLIPIN ANTIBODIES, IGG, IGM, IGA
Anticardiolipin IgA: <9 APL U/mL (ref 0–11)
Anticardiolipin IgG: <9 GPL U/mL (ref 0–14)
Anticardiolipin IgM: 10 MPL U/mL (ref 0–12)

## 2020-01-02 LAB — POTASSIUM
Potassium: 4.6 mmol/L (ref 3.5–5.1)
Potassium: 4.3 mmol/L (ref 3.5–5.1)
Potassium: 4.4 mmol/L (ref 3.5–5.1)

## 2020-01-02 LAB — PROTEIN ELECTROPHORESIS, SERUM
A/G Ratio: 0.8 (ref 0.7–1.7)
Albumin ELP: 2.5 g/dL — ABNORMAL LOW (ref 2.9–4.4)
Alpha-1-Globulin: 0.3 g/dL (ref 0.0–0.4)
Alpha-2-Globulin: 1.3 g/dL — ABNORMAL HIGH (ref 0.4–1.0)
Beta Globulin: 0.9 g/dL (ref 0.7–1.3)
Gamma Globulin: 0.8 g/dL (ref 0.4–1.8)
Globulin, Total: 3.3 g/dL (ref 2.2–3.9)
Total Protein ELP: 5.8 g/dL — ABNORMAL LOW (ref 6.0–8.5)

## 2020-01-02 LAB — GLUCOSE, CAPILLARY
Glucose-Capillary: 117 mg/dL — ABNORMAL HIGH (ref 70–99)
Glucose-Capillary: 115 mg/dL — ABNORMAL HIGH (ref 70–99)
Glucose-Capillary: 122 mg/dL — ABNORMAL HIGH (ref 70–99)
Glucose-Capillary: 138 mg/dL — ABNORMAL HIGH (ref 70–99)
Glucose-Capillary: 120 mg/dL — ABNORMAL HIGH (ref 70–99)

## 2020-01-02 LAB — CBC
HCT: 26.7 % — ABNORMAL LOW (ref 39.0–52.0)
Hemoglobin: 8.4 g/dL — ABNORMAL LOW (ref 13.0–17.0)
MCH: 28.1 pg (ref 26.0–34.0)
MCHC: 31.5 g/dL (ref 30.0–36.0)
MCV: 89.3 fL (ref 80.0–100.0)
Platelets: 575 10*3/uL — ABNORMAL HIGH (ref 150–400)
RBC: 2.99 MIL/uL — ABNORMAL LOW (ref 4.22–5.81)
RDW: 15.4 % (ref 11.5–15.5)
WBC: 13.4 10*3/uL — ABNORMAL HIGH (ref 4.0–10.5)
nRBC: 0 % (ref 0.0–0.2)

## 2020-01-02 LAB — BASIC METABOLIC PANEL
Anion gap: 12 (ref 5–15)
BUN: 46 mg/dL — ABNORMAL HIGH (ref 6–20)
CO2: 21 mmol/L — ABNORMAL LOW (ref 22–32)
Calcium: 8.2 mg/dL — ABNORMAL LOW (ref 8.9–10.3)
Chloride: 108 mmol/L (ref 98–111)
Creatinine, Ser: 5.54 mg/dL — ABNORMAL HIGH (ref 0.61–1.24)
GFR calc Af Amer: 13 mL/min — ABNORMAL LOW (ref >60)
GFR calc non Af Amer: 11 mL/min — ABNORMAL LOW (ref >60)
Glucose, Bld: 125 mg/dL — ABNORMAL HIGH (ref 70–99)
Potassium: 4.5 mmol/L (ref 3.5–5.1)
Sodium: 141 mmol/L (ref 135–145)

## 2020-01-02 LAB — HOMOCYSTEINE: Homocysteine: 23.8 umol/L — ABNORMAL HIGH (ref 0.0–14.5)

## 2020-01-02 LAB — BETA-2-GLYCOPROTEIN I ABS, IGG/M/A
Beta-2 Glyco I IgG: <9 GPI IgG units (ref 0–20)
Beta-2-Glycoprotein I IgA: <9 GPI IgA units (ref 0–25)
Beta-2-Glycoprotein I IgM: 11 GPI IgM units (ref 0–32)

## 2020-01-02 LAB — HEPATITIS B CORE ANTIBODY, TOTAL: Hep B Core Total Ab: NONREACTIVE

## 2020-01-02 LAB — KAPPA/LAMBDA LIGHT CHAINS
Kappa free light chain: 85.1 mg/L — ABNORMAL HIGH (ref 3.3–19.4)
Kappa, lambda light chain ratio: 1.23 (ref 0.26–1.65)
Lambda free light chains: 69.1 mg/L — ABNORMAL HIGH (ref 5.7–26.3)

## 2020-01-02 LAB — HEPATITIS B SURFACE ANTIGEN: Hepatitis B Surface Ag: NONREACTIVE

## 2020-01-02 SURGERY — INSERTION OF DIALYSIS CATHETER
Anesthesia: General | Site: Chest | Laterality: Right

## 2020-01-02 MED ORDER — SODIUM CHLORIDE 0.9 % IV SOLN: INTRAVENOUS | Status: DC

## 2020-01-02 MED ORDER — FENTANYL CITRATE (PF) 250 MCG/5ML IJ SOLN
INTRAMUSCULAR | Status: AC
  Filled 2020-01-02: qty 5

## 2020-01-02 MED ORDER — SODIUM CHLORIDE 0.9 % IV SOLN
INTRAVENOUS | Status: DC | PRN
  Administered 2020-01-02: 500 mL

## 2020-01-02 MED ORDER — FENTANYL CITRATE (PF) 100 MCG/2ML IJ SOLN: 25.0000 ug | INTRAMUSCULAR | Status: DC | PRN

## 2020-01-02 MED ORDER — LIDOCAINE 2% (20 MG/ML) 5 ML SYRINGE
INTRAMUSCULAR | Status: AC
  Filled 2020-01-02: qty 5

## 2020-01-02 MED ORDER — HEPARIN SODIUM (PORCINE) 1000 UNIT/ML IJ SOLN
INTRAMUSCULAR | Status: DC | PRN
  Administered 2020-01-02: 3000 [IU] via INTRAVENOUS

## 2020-01-02 MED ORDER — PENTAFLUOROPROP-TETRAFLUOROETH EX AERO: 1.0000 "application " | INHALATION_SPRAY | CUTANEOUS | Status: DC | PRN

## 2020-01-02 MED ORDER — HEPARIN SODIUM (PORCINE) 1000 UNIT/ML IJ SOLN
INTRAMUSCULAR | Status: AC
  Filled 2020-01-02: qty 4

## 2020-01-02 MED ORDER — LIDOCAINE HCL (CARDIAC) PF 100 MG/5ML IV SOSY
PREFILLED_SYRINGE | INTRAVENOUS | Status: DC | PRN
  Administered 2020-01-02: 60 mg via INTRAVENOUS

## 2020-01-02 MED ORDER — PHENYLEPHRINE HCL-NACL 10-0.9 MG/250ML-% IV SOLN
INTRAVENOUS | Status: DC | PRN
  Administered 2020-01-02: 35 ug/min via INTRAVENOUS

## 2020-01-02 MED ORDER — PROPOFOL 10 MG/ML IV BOLUS
INTRAVENOUS | Status: DC | PRN
  Administered 2020-01-02: 150 mg via INTRAVENOUS

## 2020-01-02 MED ORDER — LIDOCAINE HCL (PF) 1 % IJ SOLN: 5.0000 mL | INTRAMUSCULAR | Status: DC | PRN

## 2020-01-02 MED ORDER — ALTEPLASE 2 MG IJ SOLR: 2.0000 mg | Freq: Once | INTRAMUSCULAR | Status: DC | PRN

## 2020-01-02 MED ORDER — PHENYLEPHRINE HCL (PRESSORS) 10 MG/ML IV SOLN
INTRAVENOUS | Status: DC | PRN
  Administered 2020-01-02 (×4): 80 ug via INTRAVENOUS

## 2020-01-02 MED ORDER — 0.9 % SODIUM CHLORIDE (POUR BTL) OPTIME
TOPICAL | Status: DC | PRN
  Administered 2020-01-02: 1000 mL

## 2020-01-02 MED ORDER — CEFAZOLIN SODIUM-DEXTROSE 2-4 GM/100ML-% IV SOLN
INTRAVENOUS | Status: AC
  Filled 2020-01-02: qty 100

## 2020-01-02 MED ORDER — ONDANSETRON HCL 4 MG/2ML IJ SOLN
INTRAMUSCULAR | Status: DC | PRN
  Administered 2020-01-02: 4 mg via INTRAVENOUS

## 2020-01-02 MED ORDER — ONDANSETRON HCL 4 MG/2ML IJ SOLN
INTRAMUSCULAR | Status: AC
  Filled 2020-01-02: qty 2

## 2020-01-02 MED ORDER — SODIUM CHLORIDE 0.9 % IV SOLN: 100.0000 mL | INTRAVENOUS | Status: DC | PRN

## 2020-01-02 MED ORDER — LIDOCAINE HCL (PF) 1 % IJ SOLN
INTRAMUSCULAR | Status: AC
  Filled 2020-01-02: qty 30

## 2020-01-02 MED ORDER — EPHEDRINE SULFATE 50 MG/ML IJ SOLN
INTRAMUSCULAR | Status: DC | PRN
  Administered 2020-01-02: 10 mg via INTRAVENOUS

## 2020-01-02 MED ORDER — LIDOCAINE-PRILOCAINE 2.5-2.5 % EX CREA
1.0000 "application " | TOPICAL_CREAM | CUTANEOUS | Status: DC | PRN
  Filled 2020-01-02: qty 5

## 2020-01-02 MED ORDER — CEFAZOLIN SODIUM-DEXTROSE 2-3 GM-%(50ML) IV SOLR
INTRAVENOUS | Status: DC | PRN
  Administered 2020-01-02: 2 g via INTRAVENOUS

## 2020-01-02 MED ORDER — PROPOFOL 10 MG/ML IV BOLUS
INTRAVENOUS | Status: AC
  Filled 2020-01-02: qty 20

## 2020-01-02 MED ORDER — SODIUM CHLORIDE 0.9 % IV SOLN
INTRAVENOUS | Status: AC
  Filled 2020-01-02: qty 1.2

## 2020-01-02 MED ORDER — HEPARIN SODIUM (PORCINE) 1000 UNIT/ML IJ SOLN
INTRAMUSCULAR | Status: AC
  Filled 2020-01-02: qty 1

## 2020-01-02 MED ORDER — HEPARIN SODIUM (PORCINE) 1000 UNIT/ML DIALYSIS: 1000.0000 [IU] | INTRAMUSCULAR | Status: DC | PRN

## 2020-01-02 SURGICAL SUPPLY — 46 items
ADH SKN CLS APL DERMABOND .7 (GAUZE/BANDAGES/DRESSINGS) ×1
BAG DECANTER FOR FLEXI CONT (MISCELLANEOUS) ×3 IMPLANT
BIOPATCH RED 1 DISK 7.0 (GAUZE/BANDAGES/DRESSINGS) ×2 IMPLANT
BIOPATCH RED 1IN DISK 7.0MM (GAUZE/BANDAGES/DRESSINGS) ×1
CATH PALINDROME RT-P 15FX19CM (CATHETERS) ×2 IMPLANT
CATH PALINDROME RT-P 15FX23CM (CATHETERS) IMPLANT
CATH PALINDROME RT-P 15FX28CM (CATHETERS) IMPLANT
CATH PALINDROME RT-P 15FX55CM (CATHETERS) IMPLANT
CATH STRAIGHT 5FR 65CM (CATHETERS) IMPLANT
COVER PROBE W GEL 5X96 (DRAPES) ×3 IMPLANT
COVER SURGICAL LIGHT HANDLE (MISCELLANEOUS) ×3 IMPLANT
COVER WAND RF STERILE (DRAPES) ×3 IMPLANT
DECANTER SPIKE VIAL GLASS SM (MISCELLANEOUS) ×3 IMPLANT
DERMABOND ADVANCED (GAUZE/BANDAGES/DRESSINGS) ×2
DERMABOND ADVANCED .7 DNX12 (GAUZE/BANDAGES/DRESSINGS) ×1 IMPLANT
DRAPE C-ARM 42X72 X-RAY (DRAPES) ×3 IMPLANT
DRAPE CHEST BREAST 15X10 FENES (DRAPES) ×3 IMPLANT
GAUZE 4X4 16PLY RFD (DISPOSABLE) ×3 IMPLANT
GLOVE BIO SURGEON STRL SZ7.5 (GLOVE) ×3 IMPLANT
GLOVE BIOGEL PI IND STRL 8 (GLOVE) ×1 IMPLANT
GLOVE BIOGEL PI INDICATOR 8 (GLOVE) ×2
GOWN STRL REUS W/ TWL LRG LVL3 (GOWN DISPOSABLE) ×2 IMPLANT
GOWN STRL REUS W/ TWL XL LVL3 (GOWN DISPOSABLE) ×2 IMPLANT
GOWN STRL REUS W/TWL LRG LVL3 (GOWN DISPOSABLE) ×6
GOWN STRL REUS W/TWL XL LVL3 (GOWN DISPOSABLE) ×6
KIT BASIN OR (CUSTOM PROCEDURE TRAY) ×3 IMPLANT
KIT TURNOVER KIT B (KITS) ×3 IMPLANT
NDL 18GX1X1/2 (RX/OR ONLY) (NEEDLE) ×1 IMPLANT
NDL HYPO 25GX1X1/2 BEV (NEEDLE) ×1 IMPLANT
NEEDLE 18GX1X1/2 (RX/OR ONLY) (NEEDLE) ×3 IMPLANT
NEEDLE HYPO 25GX1X1/2 BEV (NEEDLE) ×3 IMPLANT
NS IRRIG 1000ML POUR BTL (IV SOLUTION) ×3 IMPLANT
PACK SURGICAL SETUP 50X90 (CUSTOM PROCEDURE TRAY) ×3 IMPLANT
PAD ARMBOARD 7.5X6 YLW CONV (MISCELLANEOUS) ×6 IMPLANT
SET MICROPUNCTURE 5F STIFF (MISCELLANEOUS) IMPLANT
SOAP 2 % CHG 4 OZ (WOUND CARE) ×3 IMPLANT
SUT ETHILON 3 0 PS 1 (SUTURE) ×3 IMPLANT
SUT MNCRL AB 4-0 PS2 18 (SUTURE) ×3 IMPLANT
SYR 10ML LL (SYRINGE) ×3 IMPLANT
SYR 20ML LL LF (SYRINGE) ×6 IMPLANT
SYR 5ML LL (SYRINGE) ×3 IMPLANT
SYR CONTROL 10ML LL (SYRINGE) ×3 IMPLANT
TOWEL GREEN STERILE (TOWEL DISPOSABLE) ×3 IMPLANT
TOWEL GREEN STERILE FF (TOWEL DISPOSABLE) ×3 IMPLANT
WATER STERILE IRR 1000ML POUR (IV SOLUTION) ×3 IMPLANT
WIRE AMPLATZ SS-J .035X180CM (WIRE) IMPLANT

## 2020-01-02 NOTE — Op Note (Signed)
OPERATIVE NOTE  PROCEDURE: 1. Right internal jugular vein cannulation under ultrasound guidance 2. Right internal jugular vein tunneled dialysis catheter placement (19 cm Palindrome)  PRE-OPERATIVE DIAGNOSIS: end-stage renal failure  POST-OPERATIVE DIAGNOSIS: same as above  SURGEON: Marty Heck, MD  ANESTHESIA: LMA  ESTIMATED BLOOD LOSS: 30 cc  FINDING(S): 1.  Tips of the catheter in the right atrium on fluoroscopy 2.  No obvious pneumothorax on fluoroscopy  SPECIMEN(S):  none  INDICATIONS:   Phillip Camacho is a 49 y.o. male who presents with worsening renal disease and needs a tunneled dialysis catheter placed.  The patient presents for tunneled dialysis catheter placement.  The patient is aware the risks of tunneled dialysis catheter placement include but are not limited to: bleeding, infection, central venous injury, pneumothorax, possible venous stenosis, possible malpositioning in the venous system, and possible infections related to long-term catheter presence.  The patient was aware of these risks and agreed to proceed.  DESCRIPTION: After written full informed consent was obtained from the patient, the patient was taken back to the operating room.  Prior to induction, the patient was given IV antibiotics.  After obtaining adequate sedation, the patient was prepped and draped in the standard fashion for a chest or neck tunneled dialysis catheter placement.   Under ultrasound guidance, the right internal jugular vein was evaluated, it was patent, an image was saved.  Under ultrasound it was cannulated with 18 gauge needle.  A J-wire was then placed down into the right ventricle under fluoroscopic guidance.  The wire was then secured in place with a clamp to the drapes.  I then made stab incisions at the neck and exit sites.   I dissected from the exit site to the cannulation site with a tunneler.   The back end of this catheter was transected, and docked onto the  tunneler.  The distal catheter was delivered through the subcutaneous tunnel.  The catheter was transected a second time, revealing the two lumens of this catheter.  It was then clamped.  The wire was then unclamped and I removed the needle.  The skin tract and venotomy was dilated serially with dilators.  Finally, the dilator-sheath was placed under fluoroscopic guidance into the superior vena cava.  The dilator and wire were removed.  A 19 cm Diatek catheter was placed under fluoroscopic guidance down into the right atrium.  The sheath was broken and peeled away while holding the catheter cuff at the level of the skin.  We positioned the tip of the catheter in the right atrium under fluoroscopy. The ports were docked onto these two lumens.  The catheter collar was then snapped into place.  Each port was tested by aspirating and flushing.  No resistance was noted.  Each port was then thoroughly flushed with heparinized saline.  The catheter was secured in placed with two interrupted stitches of 3-0 Nylon tied to the catheter.  The neck incision was closed with a U-stitch of 4-0 Monocryl.  The neck and chest incision were cleaned and sterile bandages applied.  Each port was then loaded with concentrated heparin (1000 Units/mL) at the manufacturer recommended volumes to each port.  Sterile caps were applied to each port.    On completion fluoroscopy, the tips of the catheter were in the right atrium, and there was no evidence of pneumothorax.  COMPLICATIONS: None  CONDITION: Stable  Marty Heck, MD [facility]: [phone]  01/02/2020, 10:40 AM

## 2020-01-02 NOTE — Anesthesia Preprocedure Evaluation (Signed)
Anesthesia Evaluation  Patient identified by MRN, date of birth, ID band Patient awake    Reviewed: Allergy & Precautions, NPO status , Patient's Chart, lab work & pertinent test results  Airway Mallampati: II  TM Distance: >3 FB     Dental   Pulmonary Current Smoker,    breath sounds clear to auscultation       Cardiovascular hypertension, + Peripheral Vascular Disease   Rhythm:Regular Rate:Normal     Neuro/Psych    GI/Hepatic negative GI ROS, Neg liver ROS,   Endo/Other  diabetes  Renal/GU Renal disease     Musculoskeletal   Abdominal   Peds  Hematology   Anesthesia Other Findings   Reproductive/Obstetrics                             Anesthesia Physical Anesthesia Plan  ASA: III  Anesthesia Plan: General   Post-op Pain Management:    Induction: Intravenous  PONV Risk Score and Plan: 0 and 1 and Ondansetron and Dexamethasone  Airway Management Planned: LMA  Additional Equipment:   Intra-op Plan:   Post-operative Plan: Extubation in OR  Informed Consent:     Dental advisory given  Plan Discussed with: CRNA and Anesthesiologist  Anesthesia Plan Comments:         Anesthesia Quick Evaluation

## 2020-01-02 NOTE — Progress Notes (Signed)
Subjective:  S/p TDC.  Still somnolent. [place] interpreter not functional right now.  Plan has been set in motion to initiated chronic hemodialysis- Dr. Royce Macadamia has spoken to the patient and her son  Objective Vital signs in last 24 hours: Vitals:   01/02/20 1209 01/02/20 1400 01/02/20 1540 01/02/20 1548  BP: (!) 157/77 137/67 (!) 148/60 (!) 143/62  Pulse: 99 99 92 90  Resp: 12 18 18 18   Temp:   (!) 97.5 F (36.4 C)   TempSrc:   Oral   SpO2: 97% 97% 98%   Weight:   56.6 kg   Height:       Weight change:   Intake/Output Summary (Last 24 hours) at 01/02/2020 1606 Last data filed at 01/02/2020 1058 Gross per 24 hour  Intake 1261.21 ml  Output 940 ml  Net 321.21 ml    Assessment/ Plan: Pt is a 49 y.o. yo male with DM. HTN, history of CVA's and progressive CKD-  Followed by Dr. Durwin Reges mose recently who was admitted on 12/31/2019 with and ischemic foot  Assessment/Plan: 1. Renal-  Progressive CKD, failure to thrive and many episodes of hyperkalemia so decision has been made to go ahead and initiate chronic dialysis for ESRD.  S[facility]p [facility] and planning for first HD treatment tonight.  Second may not be until Monday.  Will regroup next week with vascular to consider permanent access placement if appropriate.  Will also need to initiate search for OP HD unit- lives in [place] but seem to be looking at SNF  2. Hyperkalemia-  Improved with medical treatment - will stop lokelma since starting HD 3. Anemia- iron stores low-  Need to replete and also start ESA 4. Secondary hyperparathyroidism- phos high, start binder and also check PTH 5. HTN/volume-  BP adequate- norvasc 10 and prn hydralazine 6. Metabolic acidosis-  On oral bicarb, will stop likely after weekend  7. Ischemic foot- s/p fempop bypass per vasc  Phillip Camacho A Phillip Camacho    Labs: Basic Metabolic Panel: Recent Labs  Lab 01/01/20 0251 01/01/20 0311 01/01/20 1225 01/01/20 2236 01/02/20 0340 01/02/20 0651 01/02/20 1123  01/02/20 1411  NA 143  --   --  143  --  141  --   --   K 5.6*  --    < > 4.9   < > 4.5 4.3 4.4  CL 111  --   --  110  --  108  --   --   CO2 21*  --   --  18*  --  21*  --   --   GLUCOSE 120*  --   --  110*  --  125*  --   --   BUN 50*  --   --  48*  --  46*  --   --   CREATININE 5.89*  --   --  5.77*  --  5.54*  --   --   CALCIUM 8.9  --   --  8.0*  --  8.2*  --   --   PHOS  --  7.1*  --   --   --   --   --   --    < > = values in this interval not displayed.   Liver Function Tests: Recent Labs  Lab 01/01/20 0251  AST 11*  ALT 13  ALKPHOS 108  BILITOT 0.5  PROT 6.4*  ALBUMIN 2.4*   No results for input(s): LIPASE, AMYLASE in the last 168 hours.  No results for input(s): AMMONIA in the last 168 hours. CBC: Recent Labs  Lab 12/31/19 1530 12/31/19 1530 01/01/20 0251 01/01/20 2236 01/02/20 0651  WBC 14.5*   < > 12.6* 13.7* 13.4*  NEUTROABS 10.4*  --  8.2*  --   --   HGB 10.6*   < > 10.2* 8.6* 8.4*  HCT 35.0*   < > 32.2* 27.6* 26.7*  MCV 91.6  --  89.4 89.6 89.3  PLT 748*   < > 711* 587* 575*   < > = values in this interval not displayed.   Cardiac Enzymes: No results for input(s): CKTOTAL, CKMB, CKMBINDEX, TROPONINI in the last 168 hours. CBG: Recent Labs  Lab 01/01/20 2353 01/02/20 0504 01/02/20 0814 01/02/20 1054 01/02/20 1224  GLUCAP 90 117* 115* 122* 138*    Iron Studies:  Recent Labs    01/01/20 0251  IRON 13*  TIBC 209*  FERRITIN 321   Studies/Results: DG CHEST PORT 1 VIEW  Result Date: 01/02/2020 CLINICAL DATA:  Renal failure; post-op dialysis catheter placement. Hx of diabetes, stroke, loop recorder insertion(07/2019), TEE w/o cardioversion(07/2019). Smoker. EXAM: PORTABLE CHEST 1 VIEW COMPARISON:  11/14/2019 FINDINGS: Right dialysis catheter tip: Right atrium. Loop recorder noted. Low lung volumes are present, causing crowding of the pulmonary vasculature. Borderline enlargement of the cardiopericardial silhouette. The lungs appear clear. No  blunting of the costophrenic angles. IMPRESSION: 1. Right dialysis catheter tip: Right atrium.  No pneumothorax. 2. Borderline enlargement of the cardiopericardial silhouette. Electronically Signed   By: Van Clines M.D.   On: 01/02/2020 11:54   DG Fluoro Guide CV Line-No Report  Result Date: 01/02/2020 Fluoroscopy was utilized by the requesting physician.  No radiographic interpretation.   Medications: Infusions: . sodium chloride    . sodium chloride    . sodium chloride 10 mL/hr at 01/02/20 0916    Scheduled Medications: . amLODipine  10 mg Oral Daily  . aspirin EC  81 mg Oral Daily  . atorvastatin  80 mg Oral q1800  . Chlorhexidine Gluconate Cloth  6 each Topical Daily  . Chlorhexidine Gluconate Cloth  6 each Topical Q0600  . heparin  5,000 Units Subcutaneous Q8H  . insulin aspart  0-9 Units Subcutaneous Q4H  . sodium bicarbonate  1,300 mg Oral BID  . sodium chloride flush  3 mL Intravenous Q12H  . sodium zirconium cyclosilicate  10 g Oral Daily  . ticagrelor  90 mg Oral BID    have reviewed scheduled and prn medications.  Physical Exam: General: somnolent after procedure Heart: RRR Lungs: mostly clear Abdomen: soft, non tender Extremities: min edema Dialysis Access: new right [facility]     01/02/2020,4:06 PM  LOS: 2 days

## 2020-01-02 NOTE — Progress Notes (Signed)
[facility].  Updated son by phone yesterday and discussed with patient via interpretor.  Marty Heck, MD [facility] [place] Office: [place]

## 2020-01-02 NOTE — Anesthesia Postprocedure Evaluation (Signed)
Anesthesia Post Note  Patient: Phillip Camacho  Procedure(s) Performed: INSERTION OF DIALYSIS CATHETER Right Internal Jugular (Right Chest)     Patient location during evaluation: PACU Anesthesia Type: General Level of consciousness: awake Pain management: pain level controlled Vital Signs Assessment: post-procedure vital signs reviewed and stable Respiratory status: spontaneous breathing Cardiovascular status: stable Postop Assessment: no apparent nausea or vomiting Anesthetic complications: no    Last Vitals:  Vitals:   01/02/20 1200 01/02/20 1209  BP: 137/64 (!) 157/77  Pulse: 95 99  Resp: 11 12  Temp: 36.7 C   SpO2: 97% 97%    Last Pain:  Vitals:   01/02/20 1130  TempSrc:   PainSc: 0-No pain                 Melisia Leming

## 2020-01-02 NOTE — Evaluation (Signed)
Physical Therapy Evaluation Patient Details Name: Phillip Camacho MRN: [id number] DOB: May 26, 1971 Today's Date: 01/02/2020   History of Present Illness  Patient is a 49 y/o male who presents with left foot pain and ischemia. S/p Left femoral to below-knee popliteal bypass with reverse great saphenous vein and Right internal jugular vein 3/11. s/p tunneled dialysis catheter placement 3/12 due to worsening kidney disease. PMh includes DM, HTN, CKD stage IV and recurrent CVAs  Clinical Impression  Patient presents with LLE pain, generalized weakness, impaired balance, impaired cognition, lethargy and impaired mobility s/p above. Used interpreter services, Fernande Boyden [phone], however pt able to answer questions in half English/half [place] throughout session with interpreter having a hard time understanding pt at times. Pt reports falling at home a lot. Today, pt requires Mod A of 2 for standing and initiation of gait training with left lateral lean and assist with sequencing, RW management and safety. Pt high fall risk. HR up to 163 bpm during activity. Would benefit from SNF to maximize independence and mobility prior to return home.    Follow Up Recommendations SNF;Supervision for mobility/OOB    Equipment Recommendations  Other (comment)(defer to SNF)    Recommendations for Other Services       Precautions / Restrictions Precautions Precautions: Fall Precaution Comments: watch HR Restrictions Weight Bearing Restrictions: No      Mobility  Bed Mobility Overal bed mobility: Needs Assistance Bed Mobility: Supine to Sit;Sit to Supine     Supine to sit: Min assist;HOB elevated Sit to supine: Mod assist   General bed mobility comments: increased time and poor initiation of tasks, modA to lift LEs onto bed  Transfers Overall transfer level: Needs assistance Equipment used: Rolling walker (2 wheeled) Transfers: Sit to/from Stand Sit to Stand: Mod assist;+2 physical assistance;+2  safety/equipment         General transfer comment: Assist of 2 to power to standing with manual cues for hand placement. max cues for safety with body mechanics and management of walker. Stood from [employer], from chair x1.  Ambulation/Gait Ambulation/Gait assistance: Mod assist;+2 safety/equipment;+2 physical assistance Gait Distance (Feet): 4 Feet Assistive device: Rolling walker (2 wheeled) Gait Pattern/deviations: Decreased stance time - left;Decreased dorsiflexion - left;Trunk flexed Gait velocity: decreased   General Gait Details: Able to take a few steps to/from chair with Mod A due to left lateral lean, assist needed with rW management, sequencing, balance. Walking on left toes, not able to place foot on ground.  Stairs            Wheelchair Mobility    Modified Rankin (Stroke Patients Only)       Balance Overall balance assessment: Needs assistance Sitting-balance support: Feet supported;No upper extremity supported Sitting balance-Leahy Scale: Fair Sitting balance - Comments: close S to min G for safety   Standing balance support: During functional activity Standing balance-Leahy Scale: Zero Standing balance comment: reliant on B UE support and external assist                             Pertinent Vitals/Pain Pain Assessment: Faces Faces Pain Scale: Hurts even more Pain Location: LLE with movement Pain Descriptors / Indicators: Grimacing Pain Intervention(s): Repositioned;Monitored during session    Home Living Family/patient expects to be discharged to:: Skilled nursing facility Living Arrangements: Children(with son) Available Help at Discharge: Family;Available PRN/intermittently Type of Home: Apartment Home Access: Stairs to enter         Additional  Comments: unable to obtain prior level of function info from patient at this time due to grogginess/confusion/language barrier    Prior Function Level of Independence: Needs assistance    Gait / Transfers Assistance Needed: Reports walking and lots of falls.     Comments: unable to obtain accurate PLOF from patient due to grogginess/confusion with intrepreter having diffculty hearing patient's responses at times     Hand Dominance   Dominant Hand: Right    Extremity/Trunk Assessment   Upper Extremity Assessment Upper Extremity Assessment: Defer to OT evaluation;Generalized weakness    Lower Extremity Assessment Lower Extremity Assessment: Generalized weakness;LLE deficits/detail LLE Deficits / Details: Difficulty obtaining full knee extension, limited DF due to pain LLE: Unable to fully assess due to pain    Cervical / Trunk Assessment Cervical / Trunk Assessment: Normal  Communication   Communication: Prefers language other than [place];Other (comment)([place] [id number])  Cognition Arousal/Alertness: Lethargic;Suspect due to medications Behavior During Therapy: Flat affect Overall Cognitive Status: No family/caregiver present to determine baseline cognitive functioning Area of Impairment: Orientation;Attention;Following commands;Safety/judgement;Problem solving;Awareness                 Orientation Level: Disoriented to;Place;Time;Situation Current Attention Level: Focused   Following Commands: Follows one step commands with increased time;Follows one step commands inconsistently Safety/Judgement: Decreased awareness of safety;Decreased awareness of deficits Awareness: Intellectual Problem Solving: Slow processing;Decreased initiation;Difficulty sequencing;Requires verbal cues;Requires tactile cues General Comments: Not able to state where he is despite being told in beginning of session; no awareness of LE surgery. Poor attention, falling asleeping frequently. Difficulty using interpreter as pt speaking low and in [place] vs [place] or both??      General Comments General comments (skin integrity, edema, etc.): HR up to 163 bpm for a brief  period.    Exercises     Assessment/Plan    PT Assessment Patient needs continued PT services  PT Problem List Decreased strength;Decreased mobility;Decreased safety awareness;Decreased range of motion;Decreased activity tolerance;Decreased cognition;Cardiopulmonary status limiting activity;Decreased skin integrity;Pain;Decreased balance;Decreased knowledge of use of DME       PT Treatment Interventions Therapeutic activities;Gait training;Therapeutic exercise;Patient/family education;Balance training;Functional mobility training;DME instruction;Cognitive remediation    PT Goals (Current goals can be found in the Care Plan section)  Acute Rehab PT Goals Patient Stated Goal: none stated PT Goal Formulation: Patient unable to participate in goal setting Time For Goal Achievement: 01/13/2020 Potential to Achieve Goals: Fair    Frequency Min 3X/week   Barriers to discharge Decreased caregiver support      Co-evaluation PT/OT/SLP Co-Evaluation/Treatment: Yes Reason for Co-Treatment: Complexity of the patient's impairments (multi-system involvement);Necessary to address cognition/behavior during functional activity;To address functional/ADL transfers PT goals addressed during session: Mobility/safety with mobility;Proper use of DME;Balance OT goals addressed during session: ADL's and self-care       AM-PAC PT "6 Clicks" Mobility  Outcome Measure Help needed turning from your back to your side while in a flat bed without using bedrails?: A Little Help needed moving from lying on your back to sitting on the side of a flat bed without using bedrails?: A Lot Help needed moving to and from a bed to a chair (including a wheelchair)?: A Lot Help needed standing up from a chair using your arms (e.g., wheelchair or bedside chair)?: A Lot Help needed to walk in hospital room?: A Lot Help needed climbing 3-5 steps with a railing? : Total 6 Click Score: 12    End of Session Equipment  Utilized During Treatment: Gait belt Activity Tolerance:  Patient limited by lethargy;Patient limited by pain Patient left: in bed;with call bell/phone within reach;with bed alarm set Nurse Communication: Mobility status;Other (comment)(HR) PT Visit Diagnosis: Pain;Muscle weakness (generalized) (M62.81);Unsteadiness on feet (R26.81);Difficulty in walking, not elsewhere classified (R26.2) Pain - Right/Left: Left Pain - part of body: Leg    Time: 3437-3578 PT Time Calculation (min) (ACUTE ONLY): 20 min   Charges:   PT Evaluation $PT Eval Moderate Complexity: 1 Mod          Marisa Severin, PT, DPT Acute Rehabilitation Services Pager [phone] Office [place] 01/02/2020, 4:02 PM

## 2020-01-02 NOTE — Discharge Instructions (Signed)
 [facility]  Discharge instructions  Lower Extremity Bypass Surgery  Please refer to the following instruction for your post-procedure care. Your surgeon or physician assistant will discuss any changes with you.  Activity  You are encouraged to walk as much as you can. You can slowly return to normal activities during the month after your surgery. Avoid strenuous activity and heavy lifting until your doctor tells you it's OK. Avoid activities such as vacuuming or swinging a golf club. Do not drive until your doctor give the OK and you are no longer taking prescription pain medications. It is also normal to have difficulty with sleep habits, eating and bowel movement after surgery. These will go away with time.  Bathing/Showering  You may shower after you go home. Do not soak in a bathtub, hot tub, or swim until the incision heals completely.  Incision Care  Clean your incision with mild soap and water. Shower every day. Pat the area dry with a clean towel. You do not need a bandage unless otherwise instructed. Do not apply any ointments or creams to your incision. If you have open wounds you will be instructed how to care for them or a visiting nurse may be arranged for you. If you have staples or sutures along your incision they will be removed at your post-op appointment. You may have skin glue on your incision. Do not peel it off. It will come off on its own in about one week. If you have a great deal of moisture in your groin, use a gauze help keep this area dry.  Diet  Resume your normal diet. There are no special food restrictions following this procedure. A low fat/ low cholesterol diet is recommended for all patients with vascular disease. In order to heal from your surgery, it is CRITICAL to get adequate nutrition. Your body requires vitamins, minerals, and protein. Vegetables are the best source of vitamins and minerals. Vegetables also provide the  perfect balance of protein. Processed food has little nutritional value, so try to avoid this.  Medications  Resume taking all your medications unless your doctor or nurse practitioner tells you not to. If your incision is causing pain, you may take over-the-counter pain relievers such as acetaminophen (Tylenol). If you were prescribed a stronger pain medication, please aware these medication can cause nausea and constipation. Prevent nausea by taking the medication with a snack or meal. Avoid constipation by drinking plenty of fluids and eating foods with high amount of fiber, such as fruits, vegetables, and grains. Take Colase 100 mg (an over-the-counter stool softener) twice a day as needed for constipation. Do not take Tylenol if you are taking prescription pain medications.  Follow Up  Our office will schedule a follow up appointment 2-3 weeks following discharge.  Please call us immediately for any of the following conditions  Severe or worsening pain in your legs or feet while at rest or while walking Increase pain, redness, warmth, or drainage (pus) from your incision site(s) Fever of 101 degree or higher The swelling in your leg with the bypass suddenly worsens and becomes more painful than when you were in the hospital If you have been instructed to feel your graft pulse then you should do so every day. If you can no longer feel this pulse, call the office immediately. Not all patients are given this instruction.  Leg swelling is common after leg bypass surgery.  The swelling should improve over a few months   following surgery. To improve the swelling, you may elevate your legs above the level of your heart while you are sitting or resting. Your surgeon or physician assistant may ask you to apply an ACE wrap or wear compression (TED) stockings to help to reduce swelling.  Reduce your risk of vascular disease  Stop smoking. If you would like help call [facility] at 1-800-QUIT-NOW  (1-800-784-8669) or [place] at [phone].  Manage your cholesterol Maintain a desired weight Control your diabetes weight Control your diabetes Keep your blood pressure down  If you have any questions, please call the office at [phone]   

## 2020-01-02 NOTE — Anesthesia Procedure Notes (Signed)
Procedure Name: LMA Insertion Date/Time: 01/02/2020 10:06 AM Performed by: Scheryl Darter, CRNA Pre-anesthesia Checklist: Patient identified, Emergency Drugs available, Suction available and Patient being monitored Patient Re-evaluated:Patient Re-evaluated prior to induction Oxygen Delivery Method: Circle System Utilized Preoxygenation: Pre-oxygenation with 100% oxygen Induction Type: IV induction Ventilation: Mask ventilation without difficulty LMA: LMA inserted LMA Size: 4.0 Number of attempts: 1 Airway Equipment and Method: Bite block Placement Confirmation: positive ETCO2 Tube secured with: Tape Dental Injury: Teeth and Oropharynx as per pre-operative assessment

## 2020-01-02 NOTE — Evaluation (Signed)
Occupational Therapy Evaluation Patient Details Name: Phillip Camacho MRN: [id number] DOB: [birth year] Today's Date: 01/02/2020    History of Present Illness 49 y.o. male with medical history significant for insulin-dependent DM, hypertension, chronic kidney disease stage IV, and recurrent CVAs, now presenting to the emergency department with left foot pain. S/p Left femoral to below-knee popliteal bypass with reverse great saphenous vein and Right internal jugular vein tunneled dialysis catheter placement due to worsening kidney disease.     Clinical Impression   Patient is a 49 year old male, unable to provide history due to lethargic and impaired cognition however chart states patient was living with his son and requiring increased help since CVA in January 2021. Currently patient requires increased time for all mobility, poor initiation with max multimodal cues and use of [place] interpreter. Patient answering questions in [place] and [place], appears to understand simple commands/questions and shakes head yes/no. Patient require mod A x2 for transfer to recliner and back to bed with significant L lateral lean and poor safety awareness. Recommend continued acute OT to maximize patient safety and independence with self care.    Follow Up Recommendations  SNF    Equipment Recommendations  Other (comment)(defer to next venue)       Precautions / Restrictions Precautions Precautions: Fall Restrictions Weight Bearing Restrictions: No      Mobility Bed Mobility Overal bed mobility: Needs Assistance Bed Mobility: Supine to Sit;Sit to Supine     Supine to sit: Min assist;HOB elevated Sit to supine: Mod assist   General bed mobility comments: increased time and poor initiation of tasks, modA to lift LEs onto bed  Transfers Overall transfer level: Needs assistance Equipment used: Rolling walker (2 wheeled) Transfers: Sit to/from Stand Sit to Stand: Mod assist;+2 physical  assistance;+2 safety/equipment         General transfer comment: decreased strength to power up to standing without significant assist, max cues for safety with body mechanics and management of walker    Balance Overall balance assessment: Needs assistance Sitting-balance support: Bilateral upper extremity supported;Feet supported Sitting balance-Leahy Scale: Fair Sitting balance - Comments: close S to min G for safety   Standing balance support: Bilateral upper extremity supported;During functional activity Standing balance-Leahy Scale: Zero Standing balance comment: reliant on B UE support and external assist                           ADL either performed or assessed with clinical judgement   ADL Overall ADL's : Needs assistance/impaired     Grooming: Min guard;Sitting   Upper Body Bathing: Min guard;Sitting   Lower Body Bathing: Maximal assistance;Sitting/lateral leans   Upper Body Dressing : Min guard;Sitting   Lower Body Dressing: Maximal assistance;Total assistance;Sitting/lateral leans Lower Body Dressing Details (indicate cue type and reason): total A to don socks Toilet Transfer: Moderate assistance;+2 for physical assistance;+2 for safety/equipment;BSC;Ambulation;RW Toilet Transfer Details (indicate cue type and reason): simulated with transfer to recliner and back, decreased safety awareness, significant L lateral lean, poor balance Toileting- Clothing Manipulation and Hygiene: Maximal assistance;Total assistance;Sitting/lateral lean;Sit to/from stand       Functional mobility during ADLs: Moderate assistance;+2 for physical assistance;+2 for safety/equipment;Cueing for safety;Cueing for sequencing;Rolling walker General ADL Comments: patient requires increased assistance with self care due to pain in R LE, decreased strength, activity tolerance, safety awareness/cognition                  Pertinent Vitals/Pain Pain Assessment:  Faces Faces Pain  Scale: Hurts even more Pain Location: R LE Pain Descriptors / Indicators: Grimacing Pain Intervention(s): Monitored during session     Hand Dominance Right   Extremity/Trunk Assessment Upper Extremity Assessment Upper Extremity Assessment: Generalized weakness   Lower Extremity Assessment Lower Extremity Assessment: Defer to PT evaluation       Communication Communication Communication: Prefers language other than [place];Other (comment)([place] interpreter; patient appear to understand english)   Cognition Arousal/Alertness: Lethargic;Suspect due to medications Behavior During Therapy: Flat affect;Impulsive(attempts to sit before backed up to chair despite cues) Overall Cognitive Status: No family/caregiver present to determine baseline cognitive functioning Area of Impairment: Orientation;Attention;Following commands;Safety/judgement;Problem solving;Awareness                 Orientation Level: Disoriented to;Place;Time;Situation(unable to recall when prompted where he is) Current Attention Level: Sustained   Following Commands: Follows one step commands with increased time;Follows one step commands inconsistently Safety/Judgement: Decreased awareness of safety;Decreased awareness of deficits Awareness: Intellectual Problem Solving: Slow processing;Decreased initiation;Difficulty sequencing;Requires verbal cues;Requires tactile cues                Home Living Family/patient expects to be discharged to:: Skilled nursing facility                                 Additional Comments: unable to obtain prior level of function info from patient at this time due to grogginess/confusion      Prior Functioning/Environment Level of Independence: Needs assistance        Comments: unable to obtain accurate PLOF from patient due to grogginess/confusion with intrepreter having diffculty hearing patient's responses at times        OT Problem List:  Decreased strength;Decreased activity tolerance;Impaired balance (sitting and/or standing);Decreased cognition;Decreased safety awareness;Decreased knowledge of use of DME or AE;Pain      OT Treatment/Interventions: Self-care/ADL training;Therapeutic exercise;Energy conservation;DME and/or AE instruction;Therapeutic activities;Cognitive remediation/compensation;Patient/family education;Balance training    OT Goals(Current goals can be found in the care plan section) Acute Rehab OT Goals Patient Stated Goal: none stated OT Goal Formulation: Patient unable to participate in goal setting Time For Goal Achievement: 12/22/2019 Potential to Achieve Goals: Good  OT Frequency: Min 2X/week           Co-evaluation PT/OT/SLP Co-Evaluation/Treatment: Yes Reason for Co-Treatment: Complexity of the patient's impairments (multi-system involvement);Necessary to address cognition/behavior during functional activity;For patient/therapist safety;To address functional/ADL transfers   OT goals addressed during session: ADL's and self-care      AM-PAC OT "6 Clicks" Daily Activity     Outcome Measure Help from another person eating meals?: A Little Help from another person taking care of personal grooming?: A Little Help from another person toileting, which includes using toliet, bedpan, or urinal?: A Lot Help from another person bathing (including washing, rinsing, drying)?: A Lot Help from another person to put on and taking off regular upper body clothing?: A Little Help from another person to put on and taking off regular lower body clothing?: Total 6 Click Score: 14   End of Session Equipment Utilized During Treatment: Rolling walker;Gait belt Nurse Communication: Mobility status  Activity Tolerance: Patient limited by lethargy;Patient limited by pain Patient left: in bed;with call bell/phone within reach;with bed alarm set  OT Visit Diagnosis: Unsteadiness on feet (R26.81);Other abnormalities of  gait and mobility (R26.89);Muscle weakness (generalized) (M62.81);Other symptoms and signs involving cognitive function;Pain Pain - Right/Left: Right Pain - part of body:  Leg                Time: 7711-6579 OT Time Calculation (min): 29 min Charges:  OT General Charges $OT Visit: 1 Visit OT Evaluation $OT Eval Moderate Complexity: [place] OT OT office: [place] 01/02/2020, 2:14 PM

## 2020-01-02 NOTE — Transfer of Care (Signed)
Immediate Anesthesia Transfer of Care Note  Patient: Phillip Camacho  Procedure(s) Performed: INSERTION OF DIALYSIS CATHETER Right Internal Jugular (Right Chest)  Patient Location: PACU  Anesthesia Type:General  Level of Consciousness: awake, alert , oriented and sedated  Airway & Oxygen Therapy: Patient Spontanous Breathing and Patient connected to nasal cannula oxygen  Post-op Assessment: Report given to RN, Post -op Vital signs reviewed and stable and Patient moving all extremities  Post vital signs: Reviewed and stable  Last Vitals:  Vitals Value Taken Time  BP 166/73 01/02/20 1055  Temp 36.7 C 01/02/20 1055  Pulse 102 01/02/20 1058  Resp 13 01/02/20 1058  SpO2 99 % 01/02/20 1058  Vitals shown include unvalidated device data.  Last Pain:  Vitals:   01/02/20 0818  TempSrc: Oral  PainSc:          Complications: No apparent anesthesia complications

## 2020-01-02 NOTE — Progress Notes (Signed)
Patient ID: Phillip Camacho, male   DOB: 1971/01/15, 49 y.o.   MRN: [id number]  Progress Note    01/02/2020 1:51 PM Day of Surgery  Subjective: Patient has just undergone tunneled dialysis catheter for hemodialysis.  He is in recovery   Vitals:   01/02/20 1200 01/02/20 1209  BP: 137/64 (!) 157/77  Pulse: 95 99  Resp: 11 12  Temp: 98.1 F (36.7 C)   SpO2: 97% 97%   Physical Exam: His femoral-popliteal incisions are all healing without hematoma.  His left foot is hyperemic from improved flow.  Warmer than the right foot.  Has excellent dorsalis pedis signal.  CBC    Component Value Date/Time   WBC 13.4 (H) 01/02/2020 0651   RBC 2.99 (L) 01/02/2020 0651   HGB 8.4 (L) 01/02/2020 0651   HGB 11.3 (L) 11/12/2019 1616   HCT 26.7 (L) 01/02/2020 0651   HCT 34.1 (L) 11/12/2019 1616   PLT 575 (H) 01/02/2020 0651   PLT 527 (H) 11/12/2019 1616   MCV 89.3 01/02/2020 0651   MCV 84 11/12/2019 1616   MCH 28.1 01/02/2020 0651   MCHC 31.5 01/02/2020 0651   RDW 15.4 01/02/2020 0651   RDW 14.5 11/12/2019 1616   LYMPHSABS 2.7 01/01/2020 0251   MONOABS 1.0 01/01/2020 0251   EOSABS 0.6 (H) 01/01/2020 0251   BASOSABS 0.1 01/01/2020 0251    BMET    Component Value Date/Time   NA 141 01/02/2020 0651   NA 139 11/12/2019 1616   K 4.3 01/02/2020 1123   CL 108 01/02/2020 0651   CO2 21 (L) 01/02/2020 0651   GLUCOSE 125 (H) 01/02/2020 0651   BUN 46 (H) 01/02/2020 0651   BUN 34 (H) 11/12/2019 1616   CREATININE 5.54 (H) 01/02/2020 0651   CALCIUM 8.2 (L) 01/02/2020 0651   GFRNONAA 11 (L) 01/02/2020 0651   GFRAA 13 (L) 01/02/2020 0651    INR    Component Value Date/Time   INR 1.0 12/31/2019 1530     Intake/Output Summary (Last 24 hours) at 01/02/2020 1351 Last data filed at 01/02/2020 1058 Gross per 24 hour  Intake 1261.21 ml  Output 940 ml  Net 321.21 ml     Assessment/Plan:  49 y.o. male stable postop day 1 from femoropopliteal bypass for critical limb ischemia.  Multiple  medical issues including renal failure.     [name], MD FACS [facility] [phone] 01/02/2020 1:51 PM

## 2020-01-02 NOTE — Progress Notes (Signed)
PROGRESS NOTE    Phillip Camacho  UUV:[id number] DOB: 08-21-1971 DOA: 12/31/2019 PCP: [name], MD   Brief Narrative: As per admission HPI: 49 y.o. 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  Last hospitalized in January 2021 for a stroke since then has been confused, not been eating or drinking, needing supervision with ADLs. In the ED found to have potassium 6.6, bicarbonate 17, BUN 50, and creatinine 5.69, up from 4.8 in January 2021.  CBC notable for leukocytosis to 14,500, stable normocytic anemia, and increased thrombocytosis with platelets of 748,000.  INR is normal.  Covid and influenza PCR are negative.    Patient received temporizing measures with insulin/dextrose, bicarbonate, and Lokelma, nephrology/vascular surgery consulted, patient was admitted for critical left leg ischemia and hyperkalemia and renal failure. 3/11-left femoral to below-knee popliteal bypass.  Subjective:  No acute events overnight.  Potassium normalized. Used interpretor. Going for hd cath  Now  Assessment & Plan:  Critical ischemia of left lower extremity:  S/p Left femoral to below-knee popliteal bypass with reverse great saphenous vein 3/11.  Appreciate vascular input, monitor distal pulses.  Continue aspirin, Brilinta and Lipitor.  Severe Hyperkalemia: In the setting of AKI.  Status post temporizing measures/Lokelma.appreciate nephrology input on board.  Monitor potassium serially. Recent Labs  Lab 01/01/20 1428 01/01/20 1844 01/01/20 2236 01/02/20 0340 01/02/20 0651  K 5.2* 5.4* 4.9 4.6 4.5   Acute renal failure superimposed on stage 4 chronic kidney disease versus progressive CKD due to poorly controlled diabetes and hypertension: nephro on board, work-up negative so far with negative HIV, hep C, hep B.  Holding home losartan.   Continue plan as per nephrology plan for dialysis access this am and HD.Continue to monitor renal functions, urine output. Foley in place-UOP 2075 ml.  Recent Labs  Lab 12/31/19 1620 01/01/20 0251 01/01/20 2236 01/02/20 0651  BUN 50* 50* 48* 46*  CREATININE 5.69* 5.89* 4.74* 2.59*   Metabolic acidosis from renal dysfunction-monitor bmp. Hd per nephro  Nephrotic syndrome presented to diabetic nephropathy.  Monitor.  Insulin-requiring or dependent type II diabetes mellitus, hemoglobin A1c is poorly controlled 8.6 on 1/23.  Blood sugar stable 92-117 on sliding scale and will continue and monitor.    History recurrent CVAs:Patient was admitted in January with recurrent CVA, likely embolic, has had increased weakness on the left and cognitive deficits since then. Cont home Brilinta, aspirin and Lipitor.    Hypertension, borderline controlled, continue home Norvasc,  As needed hydralazine.  HLD on statins.  Tobacco use- will need to advise cessation.  Anemia of CKD-monitor hb  Social issues:Patient lives with his son who works during the day and has had difficulty caring for the patient who has been frequently confused since the recent strokes and is often unable to perform basic ADLs on his own.  Likely need to look into skilled nursing facility.   Body mass index is 19.3 kg/m.  DVT prophylaxis:Heparin Code Status:full  Family Communication: plan of care discussed with patient at bedside. Disposition Plan: Patient is from: home Anticipated Disposition:likely SNF vs HHC Barriers to discharge or conditions that needs to be met prior to discharge: remians hospitalized for critical ischemia, hyperkalemia, AKI on CKD for close monitoring of renal function and initiation of dialysis and further stabilization.  Consultants: Nephrology, vascular surgery.   Procedures:  3/11: Left femoral  to below-knee popliteal bypass with reverse great saphenous vein  Microbiology:see  note  Medications: Scheduled Meds: . amLODipine  10 mg Oral Daily  . aspirin EC  81 mg Oral Daily  . atorvastatin  80 mg Oral q1800  . Chlorhexidine Gluconate Cloth  6 each Topical Daily  . Chlorhexidine Gluconate Cloth  6 each Topical Q0600  . heparin  5,000 Units Subcutaneous Q8H  . insulin aspart  0-9 Units Subcutaneous Q4H  . sodium bicarbonate  1,300 mg Oral BID  . sodium chloride flush  3 mL Intravenous Q12H  . sodium zirconium cyclosilicate  10 g Oral Daily  . ticagrelor  90 mg Oral BID   Continuous Infusions:   Antimicrobials: Anti-infectives (From admission, onward)   Start     Dose/Rate Route Frequency Ordered Stop   01/01/20 1600  ceFAZolin (ANCEF) IVPB 2g/100 mL premix     2 g 200 mL/hr over 30 Minutes Intravenous Every 8 hours 01/01/20 1224 01/02/20 0125   01/01/20 0706  ceFAZolin (ANCEF) 2-4 GM/100ML-% IVPB    Note to Pharmacy: Lelon Perla   : cabinet override      01/01/20 0706 01/01/20 1914   12/31/19 1745  ceFAZolin (ANCEF) IVPB 2g/100 mL premix     2 g 200 mL/hr over 30 Minutes Intravenous On call to O.R. 12/31/19 1730 01/01/20 0559       Objective: Vitals: Today's Vitals   01/01/20 2111 01/01/20 2356 01/02/20 0505 01/02/20 0818  BP:  (!) 171/68 (!) 167/65 (!) 155/55  Pulse: 97 (!) 104 99 94  Resp: 15 15 12 10   Temp:  98 F (36.7 C) 98.8 F (37.1 C) 98.4 F (36.9 C)  TempSrc:  Oral Oral Oral  SpO2: 94% 97% 98% 97%  Weight:      Height:      PainSc: Asleep       Intake/Output Summary (Last 24 hours) at 01/02/2020 0842 Last data filed at 01/02/2020 0532 Gross per 24 hour  Intake 2211.21 ml  Output 2175 ml  Net 36.21 ml   Filed Weights   12/31/19 2032  Weight: 52.6 kg   Weight change:    Intake/Output from previous day: 03/11 0701 - 03/12 0700 In: 2211.2 [P.O.:444; I.V.:1200; IV Piggyback:567.2] Out: 2175 [Urine:2075; Blood:100] Intake/Output this shift: No intake/output data recorded.  Examination:  General exam:  AAO with  baseline confusion, ON RA, not in distress. HEENT:Oral mucosa moist, Ear/Nose WNL grossly,dentition normal. Respiratory system: bilaterally clear,no wheezing or crackles,no use of accessory muscle, non tender. Cardiovascular system: S1 & S2 +, regular, No JVD. Gastrointestinal system: Abdomen soft, NT,ND, BS+. Nervous System:Alert, awake, left sided weakness  Extremities: No edema, distal peripheral pulses palpable.  Skin: No rashes,no icterus. MSK: Normal muscle bulk,tone, Left leg with surgical site clean dry  Data Reviewed: I have personally reviewed following labs and imaging studies CBC: Recent Labs  Lab 12/31/19 1530 01/01/20 0251 01/01/20 2236 01/02/20 0651  WBC 14.5* 12.6* 13.7* 13.4*  NEUTROABS 10.4* 8.2*  --   --   HGB 10.6* 10.2* 8.6* 8.4*  HCT 35.0* 32.2* 27.6* 26.7*  MCV 91.6 89.4 89.6 89.3  PLT 748* 711* 587* 817*   Basic Metabolic Panel: Recent Labs  Lab 12/31/19 1620 12/31/19 1743 01/01/20 0251 01/01/20 0311 01/01/20 1225 01/01/20 1428 01/01/20 1844 01/01/20 2236 01/02/20 0340 01/02/20 0651  NA 138  --  143  --   --   --   --  143  --  141  K 6.6*   < >  5.6*  --    < > 5.2* 5.4* 4.9 4.6 4.5  CL 109  --  111  --   --   --   --  110  --  108  CO2 17*  --  21*  --   --   --   --  18*  --  21*  GLUCOSE 119*  --  120*  --   --   --   --  110*  --  125*  BUN 50*  --  50*  --   --   --   --  48*  --  46*  CREATININE 5.69*  --  5.89*  --   --   --   --  5.77*  --  5.54*  CALCIUM 8.9  --  8.9  --   --   --   --  8.0*  --  8.2*  PHOS  --   --   --  7.1*  --   --   --   --   --   --    < > = values in this interval not displayed.   GFR: Estimated Creatinine Clearance: 12.1 mL/min (A) (by C-G formula based on SCr of 5.54 mg/dL (H)). Liver Function Tests: Recent Labs  Lab 01/01/20 0251  AST 11*  ALT 13  ALKPHOS 108  BILITOT 0.5  PROT 6.4*  ALBUMIN 2.4*   No results for input(s): LIPASE, AMYLASE in the last 168 hours. No results for input(s): AMMONIA  in the last 168 hours. Coagulation Profile: Recent Labs  Lab 12/31/19 1530  INR 1.0   Cardiac Enzymes: No results for input(s): CKTOTAL, CKMB, CKMBINDEX, TROPONINI in the last 168 hours. BNP (last 3 results) No results for input(s): PROBNP in the last 8760 hours. HbA1C: No results for input(s): HGBA1C in the last 72 hours. CBG: Recent Labs  Lab 01/01/20 1637 01/01/20 2046 01/01/20 2353 01/02/20 0504 01/02/20 0814  GLUCAP 157* 122* 90 117* 115*   Lipid Profile: No results for input(s): CHOL, HDL, LDLCALC, TRIG, CHOLHDL, LDLDIRECT in the last 72 hours. Thyroid Function Tests: No results for input(s): TSH, T4TOTAL, FREET4, T3FREE, THYROIDAB in the last 72 hours. Anemia Panel: Recent Labs    01/01/20 0251  FERRITIN 321  TIBC 209*  IRON 13*   Sepsis Labs: No results for input(s): PROCALCITON, LATICACIDVEN in the last 168 hours.  Recent Results (from the past 240 hour(s))  Respiratory Panel by RT PCR (Flu A&B, Covid) - Nasopharyngeal Swab     Status: None   Collection Time: 12/31/19  5:23 PM   Specimen: Nasopharyngeal Swab  Result Value Ref Range Status   SARS Coronavirus 2 by RT PCR NEGATIVE NEGATIVE Final    Comment: (NOTE) SARS-CoV-2 target nucleic acids are NOT DETECTED. The SARS-CoV-2 RNA is generally detectable in upper respiratoy specimens during the acute phase of infection. The lowest concentration of SARS-CoV-2 viral copies this assay can detect is 131 copies/mL. A negative result does not preclude SARS-Cov-2 infection and should not be used as the sole basis for treatment or other patient management decisions. A negative result may occur with  improper specimen collection/handling, submission of specimen other than nasopharyngeal swab, presence of viral mutation(s) within the areas targeted by this assay, and inadequate number of viral copies (<131 copies/mL). A negative result must be combined with clinical observations, patient history, and  epidemiological information. The expected result is Negative. Fact Sheet for Patients:  PinkCheek.be Fact Sheet for Healthcare  Providers:  GravelBags.it This test is not yet ap proved or cleared by the [place] and  has been authorized for detection and/or diagnosis of SARS-CoV-2 by FDA under an Emergency Use Authorization (EUA). This EUA will remain  in effect (meaning this test can be used) for the duration of the COVID-19 declaration under Section 564(b)(1) of the Act, 21 U.S.C. section 360bbb-3(b)(1), unless the authorization is terminated or revoked sooner.    Influenza A by PCR NEGATIVE NEGATIVE Final   Influenza B by PCR NEGATIVE NEGATIVE Final    Comment: (NOTE) The Xpert Xpress SARS-CoV-2/FLU/RSV assay is intended as an aid in  the diagnosis of influenza from Nasopharyngeal swab specimens and  should not be used as a sole basis for treatment. Nasal washings and  aspirates are unacceptable for Xpert Xpress SARS-CoV-2/FLU/RSV  testing. Fact Sheet for Patients: PinkCheek.be Fact Sheet for Healthcare Providers: GravelBags.it This test is not yet approved or cleared by the [place] FDA and  has been authorized for detection and/or diagnosis of SARS-CoV-2 by  FDA under an Emergency Use Authorization (EUA). This EUA will remain  in effect (meaning this test can be used) for the duration of the  Covid-19 declaration under Section 564(b)(1) of the Act, 21  U.S.C. section 360bbb-3(b)(1), unless the authorization is  terminated or revoked. Performed at [facility], [place] [address] [place], [place] [address]   Surgical pcr screen     Status: Abnormal   Collection Time: 01/01/20  4:27 AM   Specimen: Nasal Mucosa; Nasal Swab  Result Value Ref Range Status   MRSA, PCR NEGATIVE NEGATIVE Final   Staphylococcus aureus POSITIVE (A) NEGATIVE Final     Comment: (NOTE) The Xpert SA Assay (FDA approved for NASAL specimens in patients 38 years of age and older), is one component of a comprehensive surveillance program. It is not intended to diagnose infection nor to guide or monitor treatment. Performed at [facility], [place] [address] [place],  [address]       Radiology Studies: No results found.   LOS: 2 days   Time spent: More than 50% of that time was spent in counseling and/or coordination of care.  Antonieta Pert, MD [facility]  01/02/2020, 8:42 AM

## 2020-01-03 ENCOUNTER — Inpatient Hospital Stay (HOSPITAL_COMMUNITY): Payer: 59

## 2020-01-03 DIAGNOSIS — I70229 Atherosclerosis of native arteries of extremities with rest pain, unspecified extremity: Secondary | ICD-10-CM

## 2020-01-03 DIAGNOSIS — N186 End stage renal disease: Secondary | ICD-10-CM

## 2020-01-03 LAB — CBC
HCT: 26.3 % — ABNORMAL LOW (ref 39.0–52.0)
Hemoglobin: 8.3 g/dL — ABNORMAL LOW (ref 13.0–17.0)
MCH: 28.2 pg (ref 26.0–34.0)
MCHC: 31.6 g/dL (ref 30.0–36.0)
MCV: 89.5 fL (ref 80.0–100.0)
Platelets: 483 10*3/uL — ABNORMAL HIGH (ref 150–400)
RBC: 2.94 MIL/uL — ABNORMAL LOW (ref 4.22–5.81)
RDW: 15.5 % (ref 11.5–15.5)
WBC: 13.4 10*3/uL — ABNORMAL HIGH (ref 4.0–10.5)
nRBC: 0 % (ref 0.0–0.2)

## 2020-01-03 LAB — BASIC METABOLIC PANEL
Anion gap: 14 (ref 5–15)
BUN: 22 mg/dL — ABNORMAL HIGH (ref 6–20)
CO2: 26 mmol/L (ref 22–32)
Calcium: 8.2 mg/dL — ABNORMAL LOW (ref 8.9–10.3)
Chloride: 104 mmol/L (ref 98–111)
Creatinine, Ser: 3.64 mg/dL — ABNORMAL HIGH (ref 0.61–1.24)
GFR calc Af Amer: 22 mL/min — ABNORMAL LOW (ref >60)
GFR calc non Af Amer: 19 mL/min — ABNORMAL LOW (ref >60)
Glucose, Bld: 122 mg/dL — ABNORMAL HIGH (ref 70–99)
Potassium: 4 mmol/L (ref 3.5–5.1)
Sodium: 144 mmol/L (ref 135–145)

## 2020-01-03 LAB — PROTEIN C ACTIVITY: Protein C Activity: 146 % (ref 73–180)

## 2020-01-03 LAB — POTASSIUM
Potassium: 3.8 mmol/L (ref 3.5–5.1)
Potassium: 4.4 mmol/L (ref 3.5–5.1)
Potassium: 4.1 mmol/L (ref 3.5–5.1)
Potassium: 4.4 mmol/L (ref 3.5–5.1)
Potassium: 4 mmol/L (ref 3.5–5.1)

## 2020-01-03 LAB — GLUCOSE, CAPILLARY
Glucose-Capillary: 108 mg/dL — ABNORMAL HIGH (ref 70–99)
Glucose-Capillary: 112 mg/dL — ABNORMAL HIGH (ref 70–99)
Glucose-Capillary: 130 mg/dL — ABNORMAL HIGH (ref 70–99)
Glucose-Capillary: 137 mg/dL — ABNORMAL HIGH (ref 70–99)
Glucose-Capillary: 150 mg/dL — ABNORMAL HIGH (ref 70–99)
Glucose-Capillary: 154 mg/dL — ABNORMAL HIGH (ref 70–99)

## 2020-01-03 LAB — PROTEIN S ACTIVITY: Protein S Activity: 120 % (ref 63–140)

## 2020-01-03 LAB — PROTEIN C, TOTAL: Protein C, Total: 127 % (ref 60–150)

## 2020-01-03 LAB — PROTEIN S, TOTAL: Protein S Ag, Total: 154 % — ABNORMAL HIGH (ref 60–150)

## 2020-01-03 MED ORDER — SODIUM CHLORIDE 0.9 % IV SOLN
250.0000 mg | Freq: Every day | INTRAVENOUS | Status: DC
  Administered 2020-01-03 – 2020-01-05 (×3): 250 mg via INTRAVENOUS
  Filled 2020-01-03 (×3): qty 20

## 2020-01-03 MED ORDER — CALCIUM ACETATE (PHOS BINDER) 667 MG PO CAPS
667.0000 mg | ORAL_CAPSULE | Freq: Three times a day (TID) | ORAL | Status: DC
  Administered 2020-01-03 – 2020-01-09 (×9): 667 mg via ORAL
  Filled 2020-01-03 (×7): qty 1

## 2020-01-03 MED ORDER — HYDRALAZINE HCL 25 MG PO TABS
25.0000 mg | ORAL_TABLET | Freq: Three times a day (TID) | ORAL | Status: DC
  Administered 2020-01-03 – 2020-01-06 (×11): 25 mg via ORAL
  Filled 2020-01-03 (×11): qty 1

## 2020-01-03 MED ORDER — DARBEPOETIN ALFA 100 MCG/0.5ML IJ SOSY
100.0000 ug | PREFILLED_SYRINGE | INTRAMUSCULAR | Status: DC
  Administered 2020-01-05: 100 ug via INTRAVENOUS
  Filled 2020-01-03 (×2): qty 0.5

## 2020-01-03 NOTE — Progress Notes (Signed)
VASCULAR LAB PRELIMINARY  PRELIMINARY  PRELIMINARY  PRELIMINARY  ABIs completed.    Preliminary report:  See CV proc for preliminary results.  Leonidus Rowand, RVT 01/03/2020, 12:29 PM

## 2020-01-03 NOTE — Progress Notes (Addendum)
[facility]  Subjective  - Keeping his knees bent up, pain at incisions with movement.   Objective 95/64 (!) 101 98 F (36.7 C) (Oral) 15 100%  Intake/Output Summary (Last 24 hours) at 01/03/2020 0815 Last data filed at 01/02/2020 2316 Gross per 24 hour  Intake 250 ml  Output 465 ml  Net -215 ml    Doppler DP/PT brisk, feet are warm to touch Left groin soft without hematoma Left LE incisions healing well.  No change in toes 3-5 appearance dry gangrene tips. Lungs non labored breathing  Assessment/Planning: S/P left femoropopliteal bypass  Right TDC. Will order vein mapping for permanant access placement in the near future.  Disposition stable   Roxy Horseman 01/03/2020 8:15 AM --  Laboratory Lab Results: Recent Labs    01/02/20 0651 01/03/20 0331  WBC 13.4* 13.4*  HGB 8.4* 8.3*  HCT 26.7* 26.3*  PLT 575* 483*   BMET Recent Labs    01/02/20 0651 01/02/20 1123 01/02/20 2315 01/03/20 0331  NA 141  --   --  144  K 4.5   < > 4.4 4.0  CL 108  --   --  104  CO2 21*  --   --  26  GLUCOSE 125*  --   --  122*  BUN 46*  --   --  22*  CREATININE 5.54*  --   --  3.64*  CALCIUM 8.2*  --   --  8.2*   < > = values in this interval not displayed.    COAG Lab Results  Component Value Date   INR 1.0 12/31/2019   INR 0.9 11/14/2019   INR 0.9 09/29/2019   No results found for: PTT  I agree with above.  I have seen and evaluate the patient.  His incisions are healing nicely.  He has PT and DP Doppler signals in his left foot.  His dialysis catheter was placed yesterday.  We will get vein mapping and schedule him for permanent access next week  Wells Trevor Duty

## 2020-01-03 NOTE — Progress Notes (Signed)
Subjective:  First brief HD last night-  Seemed to tolerate well.  I attempted to communicate with him-  Interpreter said he was giving irrelevant answers, so confused   Objective Vital signs in last 24 hours: Vitals:   01/02/20 1750 01/02/20 1829 01/02/20 2300 01/03/20 0558  BP: (!) 170/69 (!) 162/75 (!) 172/80 (!) 161/70  Pulse: 90 98 (!) 106 97  Resp: 17 11 19 16   Temp: 97.9 F (36.6 C) 98.4 F (36.9 C) 99.3 F (37.4 C) 99.4 F (37.4 C)  TempSrc: Oral Oral Oral Oral  SpO2: 98% 98% 97% 98%  Weight: 56.6 kg     Height:       Weight change:   Intake/Output Summary (Last 24 hours) at 01/03/2020 0759 Last data filed at 01/02/2020 2316 Gross per 24 hour  Intake 250 ml  Output 465 ml  Net -215 ml    Assessment/ Plan: Pt is a 49 y.o. yo male with DM. HTN, history of CVA's and progressive CKD-  Followed by Dr. Durwin Reges mose recently who was admitted on 12/31/2019 with and ischemic foot  Assessment/Plan: 1. Renal-  Progressive CKD, failure to thrive and many episodes of hyperkalemia so decision has been made to go ahead and initiate chronic dialysis for ESRD.  S/p [facility] and first HD treatment 3/12  Second may not be until Monday.  Will regroup next week with vascular to consider permanent access placement if appropriate- have informed vascular.  Will also need to initiate search for OP HD unit- lives in [place] but seem to be looking at SNF  2. Hyperkalemia-  Improved with medical treatment -  stopped lokelma since starting HD 3. Anemia- iron stores low-  Need to replete and also start ESA 4. Secondary hyperparathyroidism- phos high, start binder and also check PTH 5. HTN/volume-  BP high- norvasc 10 and prn hydralazine, will change to scheduled- BP possibly will get better with volume off but does not have a lot on him 6. Metabolic acidosis-  On oral bicarb, will stop 7. Ischemic foot- s/p fempop bypass per vasc-  patent today   Louis Meckel    Labs: Basic Metabolic  Panel: Recent Labs  Lab 01/01/20 0311 01/01/20 1225 01/01/20 2236 01/02/20 0340 01/02/20 0651 01/02/20 1123 01/02/20 1843 01/02/20 2315 01/03/20 0331  NA  --   --  143  --  141  --   --   --  144  K  --    < > 4.9   < > 4.5   < > 3.8 4.4 4.0  CL  --   --  110  --  108  --   --   --  104  CO2  --   --  18*  --  21*  --   --   --  26  GLUCOSE  --   --  110*  --  125*  --   --   --  122*  BUN  --   --  48*  --  46*  --   --   --  22*  CREATININE  --   --  5.77*  --  5.54*  --   --   --  3.64*  CALCIUM  --   --  8.0*  --  8.2*  --   --   --  8.2*  PHOS 7.1*  --   --   --   --   --   --   --   --    < > =  values in this interval not displayed.   Liver Function Tests: Recent Labs  Lab 01/01/20 0251  AST 11*  ALT 13  ALKPHOS 108  BILITOT 0.5  PROT 6.4*  ALBUMIN 2.4*   No results for input(s): LIPASE, AMYLASE in the last 168 hours. No results for input(s): AMMONIA in the last 168 hours. CBC: Recent Labs  Lab 12/31/19 1530 12/31/19 1530 01/01/20 0251 01/01/20 0251 01/01/20 2236 01/02/20 0651 01/03/20 0331  WBC 14.5*   < > 12.6*   < > 13.7* 13.4* 13.4*  NEUTROABS 10.4*  --  8.2*  --   --   --   --   HGB 10.6*   < > 10.2*   < > 8.6* 8.4* 8.3*  HCT 35.0*   < > 32.2*   < > 27.6* 26.7* 26.3*  MCV 91.6  --  89.4  --  89.6 89.3 89.5  PLT 748*   < > 711*   < > 587* 575* 483*   < > = values in this interval not displayed.   Cardiac Enzymes: No results for input(s): CKTOTAL, CKMB, CKMBINDEX, TROPONINI in the last 168 hours. CBG: Recent Labs  Lab 01/02/20 1054 01/02/20 1224 01/02/20 2002 01/03/20 0024 01/03/20 0443  GLUCAP 122* 138* 120* 130* 112*    Iron Studies:  Recent Labs    01/01/20 0251  IRON 13*  TIBC 209*  FERRITIN 321   Studies/Results: DG CHEST PORT 1 VIEW  Result Date: 01/02/2020 CLINICAL DATA:  Renal failure; post-op dialysis catheter placement. Hx of diabetes, stroke, loop recorder insertion(07/2019), TEE w/o cardioversion(07/2019). Smoker.  EXAM: PORTABLE CHEST 1 VIEW COMPARISON:  11/14/2019 FINDINGS: Right dialysis catheter tip: Right atrium. Loop recorder noted. Low lung volumes are present, causing crowding of the pulmonary vasculature. Borderline enlargement of the cardiopericardial silhouette. The lungs appear clear. No blunting of the costophrenic angles. IMPRESSION: 1. Right dialysis catheter tip: Right atrium.  No pneumothorax. 2. Borderline enlargement of the cardiopericardial silhouette. Electronically Signed   By: Van Clines M.D.   On: 01/02/2020 11:54   DG Fluoro Guide CV Line-No Report  Result Date: 01/02/2020 Fluoroscopy was utilized by the requesting physician.  No radiographic interpretation.   Medications: Infusions: . sodium chloride 10 mL/hr at 01/02/20 0916    Scheduled Medications: . amLODipine  10 mg Oral Daily  . aspirin EC  81 mg Oral Daily  . atorvastatin  80 mg Oral q1800  . Chlorhexidine Gluconate Cloth  6 each Topical Daily  . Chlorhexidine Gluconate Cloth  6 each Topical Q0600  . heparin  5,000 Units Subcutaneous Q8H  . insulin aspart  0-9 Units Subcutaneous Q4H  . sodium bicarbonate  1,300 mg Oral BID  . sodium chloride flush  3 mL Intravenous Q12H  . ticagrelor  90 mg Oral BID    have reviewed scheduled and prn medications.  Physical Exam: General: alert, moving around in bed-- thru interpreter - confused Heart: RRR Lungs: mostly clear Abdomen: soft, non tender Extremities: min edema-  Incisions from bypass  Dialysis Access: new right TDC     01/03/2020,7:59 AM  LOS: 3 days

## 2020-01-03 NOTE — Progress Notes (Signed)
VASCULAR LAB PRELIMINARY  PRELIMINARY  PRELIMINARY  PRELIMINARY  Upper extremity vein mapping completed.    Preliminary report:  See CV proc for preliminary results.   Suhaan Perleberg, RVT 01/03/2020, 12:31 PM

## 2020-01-03 NOTE — Progress Notes (Signed)
PROGRESS NOTE    Phillip Camacho  UTM:[id number] DOB: [birth year] DOA: 12/31/2019 PCP: [name], MD   Brief Narrative: As per admission HPI: 49 y.o. 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  Last hospitalized in January 2021 for a stroke since then has been confused, not been eating or drinking, needing supervision with ADLs. In the ED found to have potassium 6.6, bicarbonate 17, BUN 50, and creatinine 5.69, up from 4.8 in January 2021.  CBC notable for leukocytosis to 14,500, stable normocytic anemia, and increased thrombocytosis with platelets of 748,000.  INR is normal.  Covid and influenza PCR are negative.    Patient received temporizing measures with insulin/dextrose, bicarbonate, and Lokelma, nephrology/vascular surgery consulted, patient was admitted for critical left leg ischemia and hyperkalemia and renal failure. 3/11-left femoral to below-knee popliteal bypass.  Patient seen by nephrology and also started on dialysis after placing TDC.  Subjective: Confused at baseline,difficult to interact despite language interpretor per staff No fever overnight, WBC remains of 13.4K.  On room air doing well. Having his meal, he follows commands  Assessment & Plan:  Critical ischemia of left lower extremity:  S/p Left femoral to below-knee popliteal bypass with reverse great saphenous vein 3/11.  Appreciate vascular input, monitor distal pulses.  Continue aspirin, Brilinta and Lipitor.  Severe Hyperkalemia: In the setting of AKI.  Status post temporizing measures/Lokelma and HD. Appreciate nephrology input on board.  Monitor potassium. Recent Labs  Lab 01/02/20 1123 01/02/20 1411 01/02/20 1843 01/02/20 2315 01/03/20 0331  K 4.3 4.4 3.8 4.4 4.0   Acute renal failure superimposed on stage 4 chronic kidney disease versus  progressive CKD due to poorly controlled diabetes and hypertension: nephro on board, work-up negative so far with negative HIV, hep C, hep B.  Holding home losartan.  Continue plan as per nephrology status post tunnel dialysis catheter placement and initiation of dialysis 3/12 and looking into long-term dialysis.  Recent Labs  Lab 12/31/19 1620 01/01/20 0251 01/01/20 2236 01/02/20 0651 01/03/20 0331  BUN 50* 50* 48* 46* 22*  CREATININE 5.69* 5.89* 5.77* 5.68* 1.27*   Metabolic acidosis from renal dysfunction-bicarb improved to 26. monitor bmp. Hd per nephro  Nephrotic syndrome presented to diabetic nephropathy.  Monitor.  Insulin-requiring or dependent type II diabetes mellitus, hemoglobin A1c is poorly controlled 8.6 on 1/23.  Blood sugar stable <130 and will continue sliding scale insulin.    History recurrent CVAs:Patient was admitted in January with recurrent CVA, likely embolic, has had increased weakness on the left and cognitive deficits since then. Cont home Brilinta, aspirin and Lipitor.    Hypertension, borderline controlled, continue home Norvasc,  As needed hydralazine.  Continue to dialyse.  HLD:on statins.  Tobacco NTZ:GYFV need to advise cessation.  Anemia of CKD-hemoglobin is stable at 8.3 g.Monitor transfuse for less than 7 g.    Social issues:Patient lives with his son who works during the day and has had difficulty caring for the patient who has been frequently confused since the recent strokes and is often unable to perform basic ADLs on his own.Likely need to look into skilled nursing facility.  Body mass index is 20.76 kg/m.  DVT prophylaxis:Heparin. Code Status:full.  Family Communication:plan of care discussed with patient at bedside. Called son and updated Disposition Plan: Patient is from:home Anticipated Disposition:PT recommends SNF.  Son is at  work during daytime unable to look after him and he agrees for a skilled nursing facility. Barriers to  discharge or conditions that needs to be met prior to discharge:[place] hospitalized for management of critical ischemia status post bypass surgery, and with worsening renal failure now on dialysis.Will need SNF placement and clipping.  Discussed w Npehro  Consultants: Nephrology, vascular surgery.   Procedures:  3/11: Left femoral to below-knee popliteal bypass with reverse great saphenous vein  Microbiology:see note  Medications: Scheduled Meds: . amLODipine  10 mg Oral Daily  . aspirin EC  81 mg Oral Daily  . atorvastatin  80 mg Oral q1800  . Chlorhexidine Gluconate Cloth  6 each Topical Daily  . Chlorhexidine Gluconate Cloth  6 each Topical Q0600  . heparin  5,000 Units Subcutaneous Q8H  . insulin aspart  0-9 Units Subcutaneous Q4H  . sodium bicarbonate  1,300 mg Oral BID  . sodium chloride flush  3 mL Intravenous Q12H  . ticagrelor  90 mg Oral BID   Continuous Infusions: . sodium chloride 10 mL/hr at 01/02/20 0916    Antimicrobials: Anti-infectives (From admission, onward)   Start     Dose/Rate Route Frequency Ordered Stop   01/02/20 0927  ceFAZolin (ANCEF) 2-4 GM/100ML-% IVPB    Note to Pharmacy: [name]   : cabinet override      01/02/20 0927 01/02/20 0945   01/01/20 1600  ceFAZolin (ANCEF) IVPB 2g/100 mL premix     2 g 200 mL/hr over 30 Minutes Intravenous Every 8 hours 01/01/20 1224 01/02/20 0125   01/01/20 0706  ceFAZolin (ANCEF) 2-4 GM/100ML-% IVPB    Note to Pharmacy: Lelon Perla   : cabinet override      01/01/20 0706 01/01/20 1914   12/31/19 1745  ceFAZolin (ANCEF) IVPB 2g/100 mL premix     2 g 200 mL/hr over 30 Minutes Intravenous On call to O.R. 12/31/19 1730 01/01/20 0559       Objective: Vitals: Today's Vitals   01/02/20 2300 01/02/20 2324 01/03/20 0023 01/03/20 0558  BP: (!) 172/80   (!) 161/70  Pulse: (!) 106   97  Resp: 19   16  Temp: 99.3 F (37.4 C)   99.4 F (37.4 C)  TempSrc: Oral   Oral  SpO2: 97%   98%  Weight:        Height:      PainSc:  9  Asleep     Intake/Output Summary (Last 24 hours) at 01/03/2020 0717 Last data filed at 01/02/2020 2316 Gross per 24 hour  Intake 250 ml  Output 465 ml  Net -215 ml   Filed Weights   01/02/20 0921 01/02/20 1540 01/02/20 1750  Weight: 52.6 kg 56.6 kg 56.6 kg   Weight change:    Intake/Output from previous day: 03/12 0701 - 03/13 0700 In: 250 [I.V.:200; IV Piggyback:50] Out: 465 [Urine:450; Blood:15] Intake/Output this shift: No intake/output data recorded.  Examination:  General exam:  AAO w confusion/cognitive impairment on RA HEENT:Oral mucosa moist, Ear/Nose WNL grossly,dentition normal. Respiratory system: bilaterally clear,no wheezing or crackles,no use of accessory muscle, non tender. Cardiovascular system: S1 & S2 +, regular, No JVD. Gastrointestinal system: Abdomen soft, NT,ND, BS+. Nervous System:Alert, awake, left sided weakness  Extremities: No edema, distal peripheral pulses palpable w healing surgical site on left leg.  Skin: No rashes,no icterus. MSK: Normal muscle bulk,tone,  Data Reviewed: I have personally reviewed following labs and imaging studies CBC: Recent Labs  Lab 12/31/19 1530 01/01/20 0251 01/01/20  2236 01/02/20 0651 01/03/20 0331  WBC 14.5* 12.6* 13.7* 13.4* 13.4*  NEUTROABS 10.4* 8.2*  --   --   --   HGB 10.6* 10.2* 8.6* 8.4* 8.3*  HCT 35.0* 32.2* 27.6* 26.7* 26.3*  MCV 91.6 89.4 89.6 89.3 89.5  PLT 748* 711* 587* 575* 701*   Basic Metabolic Panel: Recent Labs  Lab 12/31/19 1620 12/31/19 1743 01/01/20 0251 01/01/20 0311 01/01/20 1225 01/01/20 2236 01/02/20 0340 01/02/20 0651 01/02/20 0651 01/02/20 1123 01/02/20 1411 01/02/20 1843 01/02/20 2315 01/03/20 0331  NA 138  --  143  --   --  143  --  141  --   --   --   --   --  144  K 6.6*   < > 5.6*  --    < > 4.9   < > 4.5   < > 4.3 4.4 3.8 4.4 4.0  CL 109  --  111  --   --  110  --  108  --   --   --   --   --  104  CO2 17*  --  21*  --   --  18*   --  21*  --   --   --   --   --  26  GLUCOSE 119*  --  120*  --   --  110*  --  125*  --   --   --   --   --  122*  BUN 50*  --  50*  --   --  48*  --  46*  --   --   --   --   --  22*  CREATININE 5.69*  --  5.89*  --   --  5.77*  --  5.54*  --   --   --   --   --  3.64*  CALCIUM 8.9  --  8.9  --   --  8.0*  --  8.2*  --   --   --   --   --  8.2*  PHOS  --   --   --  7.1*  --   --   --   --   --   --   --   --   --   --    < > = values in this interval not displayed.   GFR: Estimated Creatinine Clearance: 19.9 mL/min (A) (by C-G formula based on SCr of 3.64 mg/dL (H)). Liver Function Tests: Recent Labs  Lab 01/01/20 0251  AST 11*  ALT 13  ALKPHOS 108  BILITOT 0.5  PROT 6.4*  ALBUMIN 2.4*   No results for input(s): LIPASE, AMYLASE in the last 168 hours. No results for input(s): AMMONIA in the last 168 hours. Coagulation Profile: Recent Labs  Lab 12/31/19 1530  INR 1.0   Cardiac Enzymes: No results for input(s): CKTOTAL, CKMB, CKMBINDEX, TROPONINI in the last 168 hours. BNP (last 3 results) No results for input(s): PROBNP in the last 8760 hours. HbA1C: No results for input(s): HGBA1C in the last 72 hours. CBG: Recent Labs  Lab 01/02/20 1054 01/02/20 1224 01/02/20 2002 01/03/20 0024 01/03/20 0443  GLUCAP 122* 138* 120* 130* 112*   Lipid Profile: No results for input(s): CHOL, HDL, LDLCALC, TRIG, CHOLHDL, LDLDIRECT in the last 72 hours. Thyroid Function Tests: No results for input(s): TSH, T4TOTAL, FREET4, T3FREE, THYROIDAB in the last 72 hours. Anemia Panel: Recent Labs    01/01/20  0251  FERRITIN 321  TIBC 209*  IRON 13*   Sepsis Labs: No results for input(s): PROCALCITON, LATICACIDVEN in the last 168 hours.  Recent Results (from the past 240 hour(s))  Respiratory Panel by RT PCR (Flu A&B, Covid) - Nasopharyngeal Swab     Status: None   Collection Time: 12/31/19  5:23 PM   Specimen: Nasopharyngeal Swab  Result Value Ref Range Status   SARS Coronavirus 2  by RT PCR NEGATIVE NEGATIVE Final    Comment: (NOTE) SARS-CoV-2 target nucleic acids are NOT DETECTED. The SARS-CoV-2 RNA is generally detectable in upper respiratoy specimens during the acute phase of infection. The lowest concentration of SARS-CoV-2 viral copies this assay can detect is 131 copies/mL. A negative result does not preclude SARS-Cov-2 infection and should not be used as the sole basis for treatment or other patient management decisions. A negative result may occur with  improper specimen collection/handling, submission of specimen other than nasopharyngeal swab, presence of viral mutation(s) within the areas targeted by this assay, and inadequate number of viral copies (<131 copies/mL). A negative result must be combined with clinical observations, patient history, and epidemiological information. The expected result is Negative. Fact Sheet for Patients:  PinkCheek.be Fact Sheet for Healthcare Providers:  GravelBags.it This test is not yet ap proved or cleared by the [place] FDA and  has been authorized for detection and/or diagnosis of SARS-CoV-2 by FDA under an Emergency Use Authorization (EUA). This EUA will remain  in effect (meaning this test can be used) for the duration of the COVID-19 declaration under Section 564(b)(1) of the Act, 21 U.S.C. section 360bbb-3(b)(1), unless the authorization is terminated or revoked sooner.    Influenza A by PCR NEGATIVE NEGATIVE Final   Influenza B by PCR NEGATIVE NEGATIVE Final    Comment: (NOTE) The Xpert Xpress SARS-CoV-2/FLU/RSV assay is intended as an aid in  the diagnosis of influenza from Nasopharyngeal swab specimens and  should not be used as a sole basis for treatment. Nasal washings and  aspirates are unacceptable for Xpert Xpress SARS-CoV-2/FLU/RSV  testing. Fact Sheet for Patients: PinkCheek.be Fact Sheet for Healthcare  Providers: GravelBags.it This test is not yet approved or cleared by the [place] FDA and  has been authorized for detection and/or diagnosis of SARS-CoV-2 by  FDA under an Emergency Use Authorization (EUA). This EUA will remain  in effect (meaning this test can be used) for the duration of the  Covid-19 declaration under Section 564(b)(1) of the Act, 21  U.S.C. section 360bbb-3(b)(1), unless the authorization is  terminated or revoked. Performed at [facility], [place] [address] [place], [name] [address]   Surgical pcr screen     Status: Abnormal   Collection Time: 01/01/20  4:27 AM   Specimen: Nasal Mucosa; Nasal Swab  Result Value Ref Range Status   MRSA, PCR NEGATIVE NEGATIVE Final   Staphylococcus aureus POSITIVE (A) NEGATIVE Final    Comment: (NOTE) The Xpert SA Assay (FDA approved for NASAL specimens in patients 12 years of age and older), is one component of a comprehensive surveillance program. It is not intended to diagnose infection nor to guide or monitor treatment. Performed at [facility], [place] [address] [place],  [address]       Radiology Studies: DG CHEST PORT 1 VIEW  Result Date: 01/02/2020 CLINICAL DATA:  Renal failure; post-op dialysis catheter placement. Hx of diabetes, stroke, loop recorder insertion(07/2019), TEE w/o cardioversion(07/2019). Smoker. EXAM: PORTABLE CHEST 1 VIEW COMPARISON:  11/14/2019 FINDINGS: Right dialysis catheter tip: Right atrium. Loop recorder noted. Low lung volumes are present, causing crowding of the pulmonary vasculature. Borderline enlargement of the cardiopericardial silhouette. The lungs appear clear. No blunting of the costophrenic angles. IMPRESSION: 1. Right dialysis catheter tip: Right atrium.  No pneumothorax. 2. Borderline enlargement of the cardiopericardial silhouette. Electronically Signed   By: Van Clines M.D.   On: 01/02/2020 11:54   DG Fluoro Guide CV Line-No  Report  Result Date: 01/02/2020 Fluoroscopy was utilized by the requesting physician.  No radiographic interpretation.     LOS: 3 days   Time spent: More than 50% of that time was spent in counseling and/or coordination of care.  Antonieta Pert, MD [facility]  01/03/2020, 7:17 AM

## 2020-01-04 LAB — RENAL FUNCTION PANEL
Albumin: 1.7 g/dL — ABNORMAL LOW (ref 3.5–5.0)
Anion gap: 11 (ref 5–15)
BUN: 27 mg/dL — ABNORMAL HIGH (ref 6–20)
CO2: 26 mmol/L (ref 22–32)
Calcium: 8.4 mg/dL — ABNORMAL LOW (ref 8.9–10.3)
Chloride: 103 mmol/L (ref 98–111)
Creatinine, Ser: 4.06 mg/dL — ABNORMAL HIGH (ref 0.61–1.24)
GFR calc Af Amer: 19 mL/min — ABNORMAL LOW (ref >60)
GFR calc non Af Amer: 16 mL/min — ABNORMAL LOW (ref >60)
Glucose, Bld: 72 mg/dL (ref 70–99)
Phosphorus: 3.3 mg/dL (ref 2.5–4.6)
Potassium: 3.6 mmol/L (ref 3.5–5.1)
Sodium: 140 mmol/L (ref 135–145)

## 2020-01-04 LAB — LUPUS ANTICOAGULANT PANEL
DRVVT: 95 s — ABNORMAL HIGH (ref 0.0–47.0)
PTT Lupus Anticoagulant: 82.5 s — ABNORMAL HIGH (ref 0.0–51.9)

## 2020-01-04 LAB — CBC
HCT: 25.7 % — ABNORMAL LOW (ref 39.0–52.0)
Hemoglobin: 7.9 g/dL — ABNORMAL LOW (ref 13.0–17.0)
MCH: 27.5 pg (ref 26.0–34.0)
MCHC: 30.7 g/dL (ref 30.0–36.0)
MCV: 89.5 fL (ref 80.0–100.0)
Platelets: 479 10*3/uL — ABNORMAL HIGH (ref 150–400)
RBC: 2.87 MIL/uL — ABNORMAL LOW (ref 4.22–5.81)
RDW: 14.9 % (ref 11.5–15.5)
WBC: 14.7 10*3/uL — ABNORMAL HIGH (ref 4.0–10.5)
nRBC: 0 % (ref 0.0–0.2)

## 2020-01-04 LAB — GLUCOSE, CAPILLARY
Glucose-Capillary: 112 mg/dL — ABNORMAL HIGH (ref 70–99)
Glucose-Capillary: 138 mg/dL — ABNORMAL HIGH (ref 70–99)
Glucose-Capillary: 166 mg/dL — ABNORMAL HIGH (ref 70–99)
Glucose-Capillary: 65 mg/dL — ABNORMAL LOW (ref 70–99)
Glucose-Capillary: 67 mg/dL — ABNORMAL LOW (ref 70–99)
Glucose-Capillary: 72 mg/dL (ref 70–99)
Glucose-Capillary: 95 mg/dL (ref 70–99)

## 2020-01-04 LAB — DRVVT MIX: dRVVT Mix: 56.7 s — ABNORMAL HIGH (ref 0.0–40.4)

## 2020-01-04 LAB — PTT-LA MIX: PTT-LA Mix: 71 s — ABNORMAL HIGH (ref 0.0–48.9)

## 2020-01-04 LAB — DRVVT CONFIRM: dRVVT Confirm: 1.9 ratio — ABNORMAL HIGH (ref 0.8–1.2)

## 2020-01-04 LAB — HEXAGONAL PHASE PHOSPHOLIPID: Hexagonal Phase Phospholipid: 17 s — ABNORMAL HIGH (ref 0–11)

## 2020-01-04 MED ORDER — CHLORHEXIDINE GLUCONATE CLOTH 2 % EX PADS
6.0000 | MEDICATED_PAD | Freq: Every day | CUTANEOUS | Status: DC
  Administered 2020-01-05 – 2020-01-13 (×9): 6 via TOPICAL

## 2020-01-04 MED ORDER — CEFAZOLIN SODIUM-DEXTROSE 1-4 GM/50ML-% IV SOLN
1.0000 g | INTRAVENOUS | Status: AC
  Administered 2020-01-05: 2 g via INTRAVENOUS
  Filled 2020-01-04: qty 50

## 2020-01-04 NOTE — H&P (View-Only) (Signed)
[facility]  Subjective  - He states he is doing well over all with use of interpreter Ipad.    Objective 138/61 97 99.3 F (37.4 C) (Oral) 16 99%  Intake/Output Summary (Last 24 hours) at 01/04/2020 0805 Last data filed at 01/04/2020 0300 Gross per 24 hour  Intake 243 ml  Output 450 ml  Net -207 ml    +-----------------+-------------+----------+--------------+  Right Cephalic  Diameter (cm)Depth (cm)  Findings    +-----------------+-------------+----------+--------------+  Prox upper arm              not visualized  +-----------------+-------------+----------+--------------+  Mid upper arm              not visualized  +-----------------+-------------+----------+--------------+  Dist upper arm              not visualized  +-----------------+-------------+----------+--------------+  Antecubital fossa  0.27     0.27           +-----------------+-------------+----------+--------------+  Prox forearm     0.25     0.29           +-----------------+-------------+----------+--------------+  Mid forearm               not visualized  +-----------------+-------------+----------+--------------+  Wrist                  not visualized  +-----------------+-------------+----------+--------------+   +-----------------+-------------+----------+--------------+  Right Basilic  Diameter (cm)Depth (cm)  Findings    +-----------------+-------------+----------+--------------+  Prox upper arm    0.55     0.52           +-----------------+-------------+----------+--------------+  Mid upper arm    0.48     0.50           +-----------------+-------------+----------+--------------+  Dist upper arm    0.51     0.37            +-----------------+-------------+----------+--------------+  Antecubital fossa  0.55     0.37           +-----------------+-------------+----------+--------------+  Prox forearm     0.34     0.32   branching    +-----------------+-------------+----------+--------------+  Mid forearm     0.29     0.19           +-----------------+-------------+----------+--------------+  Wrist                  not visualized  +-----------------+-------------+----------+--------------+   +-----------------+-------------+----------+--------------+  Left Cephalic  Diameter (cm)Depth (cm)  Findings    +-----------------+-------------+----------+--------------+  Prox upper arm    0.17     0.20           +-----------------+-------------+----------+--------------+  Mid upper arm    0.19     0.26           +-----------------+-------------+----------+--------------+  Dist upper arm    0.25     0.24           +-----------------+-------------+----------+--------------+  Antecubital fossa  0.27     0.27           +-----------------+-------------+----------+--------------+  Prox forearm     0.25     0.29   branching    +-----------------+-------------+----------+--------------+  Mid forearm     0.20     0.25           +-----------------+-------------+----------+--------------+  Wrist                  not visualized  +-----------------+-------------+----------+--------------+   +-----------------+-------------+----------+---------+  Left Basilic   Diameter (cm)Depth (cm)Findings   +-----------------+-------------+----------+---------+  Prox upper arm    0.34     0.87  branching  +-----------------+-------------+----------+---------+  Mid upper arm    0.28     0.62          +-----------------+-------------+----------+---------+  Dist upper arm    0.38     0.43         +-----------------+-------------+----------+---------+  Antecubital fossa  0.40     0.48         +-----------------+-------------+----------+---------+  Prox forearm     0.18     0.36  branching  +-----------------+-------------+----------+---------+  Mid forearm     0.11     0.17         +-----------------+-------------+----------+---------+  Wrist        0.11     0.19  branching  +-----------------+-------------+----------+---------+   ABI's  +-------+-----------+-----------+------------+------------+  ABI/TBIToday's ABIToday's TBIPrevious ABIPrevious TBI  +-------+-----------+-----------+------------+------------+  Right 0.65    0.17                  +-------+-----------+-----------+------------+------------+  Left  0.45    0.12                  +-------+-----------+-----------+------------+------------+    Doppler DP/PT brisk left LE Incisions healing well, groin soft without hematoma No change in 3-5 toe tips, non open wounds Lungs non labored breathing Tachy 100-120 when moving around  Assessment/Planning:  S/P left femoropopliteal bypass  Patent blood flow Right TDC.  He will need UE HD access.  On vein mapping he has acceptable  B Basilic veins for conduit.  I do not know what hand is dominant, we will need this information before surgery.     Roxy Horseman 01/04/2020 8:05 AM --  Laboratory Lab Results: Recent Labs    01/03/20 0331 01/04/20 0153  WBC 13.4* 14.7*  HGB 8.3* 7.9*  HCT 26.3* 25.7*  PLT 483* 479*   BMET Recent Labs    01/03/20 0331 01/03/20 1050 01/03/20 1845 01/04/20 0153  NA 144  --   --  140  K 4.0   < > 4.0 3.6  CL 104  --   --  103  CO2 26  --   --  26  GLUCOSE 122*  --   --  72  BUN  22*  --   --  27*  CREATININE 3.64*  --   --  4.06*  CALCIUM 8.2*  --   --  8.4*   < > = values in this interval not displayed.    COAG Lab Results  Component Value Date   INR 1.0 12/31/2019   INR 0.9 11/14/2019   INR 0.9 09/29/2019   No results found for: PTT  I agree with the above.  I tried to communicate with the patient via the interpreter, however he did not appear to comprehend.  I spoke with his son about proceeding with a left arm fistula tomorrow. I discussed the procedure and the potential need for a second operation if we use his basilic vein.  He will be NPO after midnight.  +-----------------+-------------+----------+--------------+  Left Cephalic  Diameter (cm)Depth (cm)  Findings    +-----------------+-------------+----------+--------------+  Prox upper arm    0.17     0.20           +-----------------+-------------+----------+--------------+  Mid upper arm    0.19     0.26           +-----------------+-------------+----------+--------------+  Dist upper arm    0.25  0.24           +-----------------+-------------+----------+--------------+  Antecubital fossa  0.27     0.27           +-----------------+-------------+----------+--------------+  Prox forearm     0.25     0.29   branching    +-----------------+-------------+----------+--------------+  Mid forearm     0.20     0.25           +-----------------+-------------+----------+--------------+  Wrist                  not visualized  +-----------------+-------------+----------+--------------+   +-----------------+-------------+----------+---------+  Left Basilic   Diameter (cm)Depth (cm)Findings   +-----------------+-------------+----------+---------+  Prox upper arm    0.34     0.87  branching   +-----------------+-------------+----------+---------+  Mid upper arm    0.28     0.62         +-----------------+-------------+----------+---------+  Dist upper arm    0.38     0.43         +-----------------+-------------+----------+---------+  Antecubital fossa  0.40     0.48         +-----------------+-------------+----------+---------+  Prox forearm     0.18     0.36  branching  +-----------------+-------------+----------+---------+  Mid forearm     0.11     0.17         +-----------------+-------------+----------+---------+  Wrist        0.11     0.19  branching  +-----------------+-------------+----------+---------+   Annamarie Major

## 2020-01-04 NOTE — Progress Notes (Addendum)
[facility]  Subjective  - He states he is doing well over all with use of interpreter Ipad.    Objective 138/61 97 99.3 F (37.4 C) (Oral) 16 99%  Intake/Output Summary (Last 24 hours) at 01/04/2020 0805 Last data filed at 01/04/2020 0300 Gross per 24 hour  Intake 243 ml  Output 450 ml  Net -207 ml    +-----------------+-------------+----------+--------------+  Right Cephalic  Diameter (cm)Depth (cm)  Findings    +-----------------+-------------+----------+--------------+  Prox upper arm              not visualized  +-----------------+-------------+----------+--------------+  Mid upper arm              not visualized  +-----------------+-------------+----------+--------------+  Dist upper arm              not visualized  +-----------------+-------------+----------+--------------+  Antecubital fossa  0.27     0.27           +-----------------+-------------+----------+--------------+  Prox forearm     0.25     0.29           +-----------------+-------------+----------+--------------+  Mid forearm               not visualized  +-----------------+-------------+----------+--------------+  Wrist                  not visualized  +-----------------+-------------+----------+--------------+   +-----------------+-------------+----------+--------------+  Right Basilic  Diameter (cm)Depth (cm)  Findings    +-----------------+-------------+----------+--------------+  Prox upper arm    0.55     0.52           +-----------------+-------------+----------+--------------+  Mid upper arm    0.48     0.50           +-----------------+-------------+----------+--------------+  Dist upper arm    0.51     0.37            +-----------------+-------------+----------+--------------+  Antecubital fossa  0.55     0.37           +-----------------+-------------+----------+--------------+  Prox forearm     0.34     0.32   branching    +-----------------+-------------+----------+--------------+  Mid forearm     0.29     0.19           +-----------------+-------------+----------+--------------+  Wrist                  not visualized  +-----------------+-------------+----------+--------------+   +-----------------+-------------+----------+--------------+  Left Cephalic  Diameter (cm)Depth (cm)  Findings    +-----------------+-------------+----------+--------------+  Prox upper arm    0.17     0.20           +-----------------+-------------+----------+--------------+  Mid upper arm    0.19     0.26           +-----------------+-------------+----------+--------------+  Dist upper arm    0.25     0.24           +-----------------+-------------+----------+--------------+  Antecubital fossa  0.27     0.27           +-----------------+-------------+----------+--------------+  Prox forearm     0.25     0.29   branching    +-----------------+-------------+----------+--------------+  Mid forearm     0.20     0.25           +-----------------+-------------+----------+--------------+  Wrist                  not visualized  +-----------------+-------------+----------+--------------+   +-----------------+-------------+----------+---------+  Left Basilic   Diameter (cm)Depth (cm)Findings   +-----------------+-------------+----------+---------+  Prox upper arm    0.34     0.87  branching  +-----------------+-------------+----------+---------+  Mid upper arm    0.28     0.62          +-----------------+-------------+----------+---------+  Dist upper arm    0.38     0.43         +-----------------+-------------+----------+---------+  Antecubital fossa  0.40     0.48         +-----------------+-------------+----------+---------+  Prox forearm     0.18     0.36  branching  +-----------------+-------------+----------+---------+  Mid forearm     0.11     0.17         +-----------------+-------------+----------+---------+  Wrist        0.11     0.19  branching  +-----------------+-------------+----------+---------+   ABI's  +-------+-----------+-----------+------------+------------+  ABI/TBIToday's ABIToday's TBIPrevious ABIPrevious TBI  +-------+-----------+-----------+------------+------------+  Right 0.65    0.17                  +-------+-----------+-----------+------------+------------+  Left  0.45    0.12                  +-------+-----------+-----------+------------+------------+    Doppler DP/PT brisk left LE Incisions healing well, groin soft without hematoma No change in 3-5 toe tips, non open wounds Lungs non labored breathing Tachy 100-120 when moving around  Assessment/Planning:  S/P left femoropopliteal bypass  Patent blood flow Right TDC.  He will need UE HD access.  On vein mapping he has acceptable  B Basilic veins for conduit.  I do not know what hand is dominant, we will need this information before surgery.     Roxy Horseman 01/04/2020 8:05 AM --  Laboratory Lab Results: Recent Labs    01/03/20 0331 01/04/20 0153  WBC 13.4* 14.7*  HGB 8.3* 7.9*  HCT 26.3* 25.7*  PLT 483* 479*   BMET Recent Labs    01/03/20 0331 01/03/20 1050 01/03/20 1845 01/04/20 0153  NA 144  --   --  140  K 4.0   < > 4.0 3.6  CL 104  --   --  103  CO2 26  --   --  26  GLUCOSE 122*  --   --  72  BUN  22*  --   --  27*  CREATININE 3.64*  --   --  4.06*  CALCIUM 8.2*  --   --  8.4*   < > = values in this interval not displayed.    COAG Lab Results  Component Value Date   INR 1.0 12/31/2019   INR 0.9 11/14/2019   INR 0.9 09/29/2019   No results found for: PTT  I agree with the above.  I tried to communicate with the patient via the interpreter, however he did not appear to comprehend.  I spoke with his son about proceeding with a left arm fistula tomorrow. I discussed the procedure and the potential need for a second operation if we use his basilic vein.  He will be NPO after midnight.  +-----------------+-------------+----------+--------------+  Left Cephalic  Diameter (cm)Depth (cm)  Findings    +-----------------+-------------+----------+--------------+  Prox upper arm    0.17     0.20           +-----------------+-------------+----------+--------------+  Mid upper arm    0.19     0.26           +-----------------+-------------+----------+--------------+  Dist upper arm    0.25  0.24           +-----------------+-------------+----------+--------------+  Antecubital fossa  0.27     0.27           +-----------------+-------------+----------+--------------+  Prox forearm     0.25     0.29   branching    +-----------------+-------------+----------+--------------+  Mid forearm     0.20     0.25           +-----------------+-------------+----------+--------------+  Wrist                  not visualized  +-----------------+-------------+----------+--------------+   +-----------------+-------------+----------+---------+  Left Basilic   Diameter (cm)Depth (cm)Findings   +-----------------+-------------+----------+---------+  Prox upper arm    0.34     0.87  branching   +-----------------+-------------+----------+---------+  Mid upper arm    0.28     0.62         +-----------------+-------------+----------+---------+  Dist upper arm    0.38     0.43         +-----------------+-------------+----------+---------+  Antecubital fossa  0.40     0.48         +-----------------+-------------+----------+---------+  Prox forearm     0.18     0.36  branching  +-----------------+-------------+----------+---------+  Mid forearm     0.11     0.17         +-----------------+-------------+----------+---------+  Wrist        0.11     0.19  branching  +-----------------+-------------+----------+---------+   Phillip Camacho

## 2020-01-04 NOTE — NC FL2 (Signed)
[place] LEVEL OF CARE SCREENING TOOL     IDENTIFICATION  Patient Name: Phillip Camacho Birthdate: [birth year] Sex: male Admission Date (Current Location): 12/31/2019  [facility] and [place] Number:  Herbalist and Address:  [facility], [place] [address], [place], [place] [address]      Provider Number: [id number]  Attending Physician Name and Address:  Antonieta Pert, MD  Relative Name and Phone Number:  [name] [phone]    Current Level of Care: Hospital Recommended Level of Care: [address] Prior Approval Number:    Date Approved/Denied:   PASRR Number: [id number]  Discharge Plan: SNF    Current Diagnoses: Patient Active Problem List   Diagnosis Date Noted  . PAD (peripheral artery disease) ([place]) 12/31/2019  . Pain of left lower extremity due to ischemia   . Hyperlipidemia   . CKD (chronic kidney disease), stage IV ([place]) 11/14/2019  . Hypertension 11/14/2019  . Hyperkalemia 11/14/2019  . 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 Acute renal failure superimposed on stage 4 chronic kidney disease ([place]) 09/17/2019  . Nephrotic syndrome 09/17/2019  . Insulin-requiring or dependent type II diabetes mellitus ([place]) 09/17/2019  . Nephrotic syndrome in diabetes mellitus ([place]) 09/17/2019  . Acute CVA (cerebrovascular accident) ([place]) 08/11/2019  . DM (diabetes mellitus) ([place]) 08/11/2019  . Benign hypertension with chronic kidney disease, stage III 08/11/2019  . Tobacco abuse 08/11/2019  . CKD stage 4 due to type 2 diabetes mellitus ([place]) 08/11/2019    Orientation RESPIRATION BLADDER Height & Weight     Self  Normal Continent Weight: 124 lb 12.5 oz (56.6 kg) Height:  5\' 5"  (165.1 cm)  BEHAVIORAL SYMPTOMS/MOOD NEUROLOGICAL BOWEL NUTRITION STATUS      Continent Diet(See Discharge Summary)  AMBULATORY STATUS COMMUNICATION OF NEEDS Skin   Extensive Assist Verbally Other (Comment)(Abraision Left Toe, Closed  Surgical Incision Left Leg, Closed Surgical Incision RT. chest)                       Personal Care Assistance Level of Assistance  Bathing, Feeding, Dressing Bathing Assistance: Limited assistance Feeding assistance: Limited assistance Dressing Assistance: Limited assistance     Functional Limitations Info  Sight, Hearing, Speech Sight Info: Adequate Hearing Info: Adequate Speech Info: Adequate(preferred [place]amese)    SPECIAL CARE FACTORS FREQUENCY  PT (By licensed PT), OT (By licensed OT)     PT Frequency: 5x Min Weekly OT Frequency: 5x Min Weekly            Contractures Contractures Info: Not present    Additional Factors Info  Code Status Code Status Info: FULL             Current Medications (01/04/2020):  This is the current hospital active medication list Current Facility-Administered Medications  Medication Dose Route Frequency Provider Last Rate Last Admin  . 0.9 %  sodium chloride infusion   Intravenous Continuous Marty Heck, MD 10 mL/hr at 01/02/20 0916 Restarted at 01/02/20 0930  . acetaminophen (TYLENOL) tablet 650 mg  650 mg Oral Q6H PRN Marty Heck, MD       Or  . acetaminophen (TYLENOL) suppository 650 mg  650 mg Rectal Q6H PRN Marty Heck, MD      . amLODipine (NORVASC) tablet 10 mg  10 mg Oral Daily Marty Heck, MD   10 mg at 01/04/20 0912  . aspirin EC tablet 81 mg  81 mg Oral Daily Monica Martinez  J, MD   81 mg at 01/04/20 0912  . atorvastatin (LIPITOR) tablet 80 mg  80 mg Oral q1800 Marty Heck, MD   80 mg at 01/03/20 1818  . calcium acetate (PHOSLO) capsule 667 mg  667 mg Oral TID WC Corliss Parish, MD   667 mg at 01/04/20 0911  . [START ON 01/05/2020] ceFAZolin (ANCEF) IVPB 1 g/50 mL premix  1 g Intravenous On Call Serafina Mitchell, MD      . Chlorhexidine Gluconate Cloth 2 % PADS 6 each  6 each Topical Daily Marty Heck, MD   6 each at 01/04/20 0913  . Chlorhexidine Gluconate  Cloth 2 % PADS 6 each  6 each Topical Q0600 Marty Heck, MD   6 each at 01/04/20 0913  . [START ON 01/05/2020] Darbepoetin Alfa (ARANESP) injection 100 mcg  100 mcg Intravenous Q Mon-HD Corliss Parish, MD      . fentaNYL (SUBLIMAZE) injection 25-50 mcg  25-50 mcg Intravenous Q3H PRN Marty Heck, MD   25 mcg at 01/01/20 0500  . ferric gluconate (NULECIT) 250 mg in sodium chloride 0.9 % 100 mL IVPB  250 mg Intravenous Daily Corliss Parish, MD 120 mL/hr at 01/04/20 0917 250 mg at 01/04/20 0917  . heparin injection 5,000 Units  5,000 Units Subcutaneous Q8H Marty Heck, MD   5,000 Units at 01/04/20 0636  . hydrALAZINE (APRESOLINE) injection 5 mg  5 mg Intravenous Q6H PRN Marty Heck, MD      . hydrALAZINE (APRESOLINE) tablet 25 mg  25 mg Oral Q8H Corliss Parish, MD   25 mg at 01/04/20 0636  . insulin aspart (novoLOG) injection 0-9 Units  0-9 Units Subcutaneous Q4H Marty Heck, MD   1 Units at 01/04/20 0010  . morphine 2 MG/ML injection 2 mg  2 mg Intravenous Q4H PRN Marty Heck, MD   2 mg at 01/03/20 1431  . ondansetron (ZOFRAN) tablet 4 mg  4 mg Oral Q6H PRN Marty Heck, MD       Or  . ondansetron [facility]) injection 4 mg  4 mg Intravenous Q6H PRN Marty Heck, MD      . oxyCODONE-acetaminophen (PERCOCET/ROXICET) 5-325 MG per tablet 1-2 tablet  1-2 tablet Oral Q4H PRN Marty Heck, MD   2 tablet at 01/04/20 0912  . sodium chloride flush (NS) 0.9 % injection 3 mL  3 mL Intravenous Q12H Marty Heck, MD   3 mL at 01/04/20 0913  . ticagrelor (BRILINTA) tablet 90 mg  90 mg Oral BID Marty Heck, MD   90 mg at 01/04/20 3818     Discharge Medications: Please see discharge summary for a list of discharge medications.  Relevant Imaging Results:  Relevant Lab Results:   Additional Information [id number]  Trula Ore, LCSWA

## 2020-01-04 NOTE — Progress Notes (Signed)
Subjective:  VVS this AM trying to communicate with him-  Having same issues that I was.  I spoke to son [name] [phone]  [name] says pt has been confused with his strokes but more confused the last 2 weeks.   Objective Vital signs in last 24 hours: Vitals:   01/03/20 0824 01/03/20 1937 01/04/20 0002 01/04/20 0440  BP: (!) 170/70 (!) 158/69 (!) 157/92 138/61  Pulse: 69 93 98 97  Resp: 18 10 16 16   Temp: 98 F (36.7 C) 98.8 F (37.1 C) 99.6 F (37.6 C) 99.3 F (37.4 C)  TempSrc: Oral Oral Oral Oral  SpO2: 98% 96% 96% 99%  Weight:      Height:       Weight change:   Intake/Output Summary (Last 24 hours) at 01/04/2020 0856 Last data filed at 01/04/2020 0300 Gross per 24 hour  Intake 240 ml  Output 450 ml  Net -210 ml    Assessment/ Plan: Pt is a 49 y.o. yo male with DM. HTN, history of CVA's and progressive CKD-  Followed by Dr. Durwin Reges mose recently who was admitted on 12/31/2019 with and ischemic foot  Assessment/Plan: 1. Renal-  Progressive CKD, failure to thrive and many episodes of hyperkalemia so decision has been made to go ahead and initiate chronic dialysis for ESRD.  S/p [facility] and first HD treatment 3/12  Second treatment Monday.  Have spoken to vascular to consider permanent access placement- spoke to son about it as well.  Will also need to initiate search for OP HD unit- lives in [place] but seem to be looking at SNF as pt cannot be left alone and son works.  Renal navigator not aware yet  2. Hyperkalemia-  Improved with medical treatment -  stopped lokelma since starting HD 3. Anemia- iron stores low-  Need to repleting with ferrlecit  and also started ESA 4. Secondary hyperparathyroidism- phos high, start binder PTH pending 5. HTN/volume-  BP high- norvasc 10 and prn hydralazine, will change to scheduled- BP possibly will get better with volume off but does not have a lot on him.  Is better today  6. Metabolic acidosis-  resolved 7. Ischemic foot- s/p fempop bypass  per vasc-  patent today  8.  Multiple CVA's-  On secondary prevention.  Strange for 49 year old to have all of these vascular issues.  Just DM or is there another unifying diagnosis ?? 8.  Dispo-  Needs SNF due to confusion and eyesight.  Son wondering what prognosis on eyesight is [place]: Basic Metabolic Panel: Recent Labs  Lab 01/01/20 0311 01/01/20 1225 01/02/20 0651 01/02/20 1123 01/03/20 0331 01/03/20 1050 01/03/20 1451 01/03/20 1845 01/04/20 0153  NA  --    < > 141  --  144  --   --   --  140  K  --    < > 4.5   < > 4.0   < > 4.4 4.0 3.6  CL  --    < > 108  --  104  --   --   --  103  CO2  --    < > 21*  --  26  --   --   --  26  GLUCOSE  --    < > 125*  --  122*  --   --   --  72  BUN  --    < > 46*  --  22*  --   --   --  27*  CREATININE  --    < > 5.54*  --  3.64*  --   --   --  4.06*  CALCIUM  --    < > 8.2*  --  8.2*  --   --   --  8.4*  PHOS 7.1*  --   --   --   --   --   --   --  3.3   < > = values in this interval not displayed.   Liver Function Tests: Recent Labs  Lab 01/01/20 0251 01/04/20 0153  AST 11*  --   ALT 13  --   ALKPHOS 108  --   BILITOT 0.5  --   PROT 6.4*  --   ALBUMIN 2.4* 1.7*   No results for input(s): LIPASE, AMYLASE in the last 168 hours. No results for input(s): AMMONIA in the last 168 hours. CBC: Recent Labs  Lab 12/31/19 1530 12/31/19 1530 01/01/20 0251 01/01/20 0251 01/01/20 2236 01/01/20 2236 01/02/20 0651 01/03/20 0331 01/04/20 0153  WBC 14.5*   < > 12.6*   < > 13.7*   < > 13.4* 13.4* 14.7*  NEUTROABS 10.4*  --  8.2*  --   --   --   --   --   --   HGB 10.6*   < > 10.2*   < > 8.6*   < > 8.4* 8.3* 7.9*  HCT 35.0*   < > 32.2*   < > 27.6*   < > 26.7* 26.3* 25.7*  MCV 91.6   < > 89.4  --  89.6  --  89.3 89.5 89.5  PLT 748*   < > 711*   < > 587*   < > 575* 483* 479*   < > = values in this interval not displayed.   Cardiac Enzymes: No results for input(s): CKTOTAL, CKMB, CKMBINDEX, TROPONINI  in the last 168 hours. CBG: Recent Labs  Lab 01/03/20 2034 01/04/20 0000 01/04/20 0438 01/04/20 0505 01/04/20 0807  GLUCAP 137* 138* 65* 72 95    Iron Studies:  No results for input(s): IRON, TIBC, TRANSFERRIN, FERRITIN in the last 72 hours. Studies/Results: DG CHEST PORT 1 VIEW  Result Date: 01/02/2020 CLINICAL DATA:  Renal failure; post-op dialysis catheter placement. Hx of diabetes, stroke, loop recorder insertion(07/2019), TEE w/o cardioversion(07/2019). Smoker. EXAM: PORTABLE CHEST 1 VIEW COMPARISON:  11/14/2019 FINDINGS: Right dialysis catheter tip: Right atrium. Loop recorder noted. Low lung volumes are present, causing crowding of the pulmonary vasculature. Borderline enlargement of the cardiopericardial silhouette. The lungs appear clear. No blunting of the costophrenic angles. IMPRESSION: 1. Right dialysis catheter tip: Right atrium.  No pneumothorax. 2. Borderline enlargement of the cardiopericardial silhouette. Electronically Signed   By: Van Clines M.D.   On: 01/02/2020 11:54   DG Fluoro Guide CV Line-No Report  Result Date: 01/02/2020 Fluoroscopy was utilized by the requesting physician.  No radiographic interpretation.   VAS [place] ABI WITH/WO TBI  Result Date: 01/03/2020 LOWER EXTREMITY DOPPLER STUDY Indications: Rest pain. High Risk Factors: Hypertension, Diabetes, prior CVA. Other Factors: CKD,.  Vascular Interventions: S/P Left leg femoral to below knee popliteal bypass. Comparison Study: No prior Performing Technologist: Sharion Dove RVS  Examination Guidelines: A complete evaluation includes at minimum, Doppler waveform signals and systolic blood pressure reading at the level of bilateral brachial, anterior tibial, and posterior tibial arteries, when vessel segments are accessible. Bilateral testing is considered an integral  part of a complete examination. Photoelectric Plethysmograph (PPG) waveforms and toe systolic pressure readings are included as required and  additional duplex testing as needed. Limited examinations for reoccurring indications may be performed as noted.  ABI Findings: +---------+------------------+-----+---------+--------+ Right    Rt Pressure (mmHg)IndexWaveform Comment  +---------+------------------+-----+---------+--------+ Brachial 179                    triphasic         +---------+------------------+-----+---------+--------+ PTA      117               0.65                   +---------+------------------+-----+---------+--------+ DP       109               0.60                   +---------+------------------+-----+---------+--------+ Great Toe31                0.17                   +---------+------------------+-----+---------+--------+ +---------+------------------+-----+---------+-------+ Left     Lt Pressure (mmHg)IndexWaveform Comment +---------+------------------+-----+---------+-------+ Brachial 181                    triphasic        +---------+------------------+-----+---------+-------+ PTA      74                0.41                  +---------+------------------+-----+---------+-------+ DP       81                0.45                  +---------+------------------+-----+---------+-------+ Great Toe22                0.12                  +---------+------------------+-----+---------+-------+ +-------+-----------+-----------+------------+------------+ ABI/TBIToday's ABIToday's TBIPrevious ABIPrevious TBI +-------+-----------+-----------+------------+------------+ Right  0.65       0.17                                +-------+-----------+-----------+------------+------------+ Left   0.45       0.12                                +-------+-----------+-----------+------------+------------+  Summary: Right: Resting right ankle-brachial index indicates moderate right lower extremity arterial disease. The right toe-brachial index is abnormal. Left: Resting left  ankle-brachial index indicates severe left lower extremity arterial disease. The left toe-brachial index is abnormal.  *See table(s) above for measurements and observations.  Electronically signed by Harold Barban MD on 01/03/2020 at 27:05:55 PM.    Final    VAS [place] UPPER EXT VEIN MAPPING (PRE-OP AVF)  Result Date: 01/03/2020 UPPER EXTREMITY VEIN MAPPING  Indications: Pre-dialysis access. Limitations: bandages/IVs bilaterally Comparison Study: No prior Performing Technologist: Sharion Dove RVS  Examination Guidelines: A complete evaluation includes B-mode imaging, spectral Doppler, color Doppler, and power Doppler as needed of all accessible portions of each vessel. Bilateral testing is considered an integral part of a complete examination. Limited examinations for reoccurring indications may be performed as noted. +-----------------+-------------+----------+--------------+ Right Cephalic  Diameter (cm)Depth (cm)   Findings    +-----------------+-------------+----------+--------------+ Prox upper arm                          not visualized +-----------------+-------------+----------+--------------+ Mid upper arm                           not visualized +-----------------+-------------+----------+--------------+ Dist upper arm                          not visualized +-----------------+-------------+----------+--------------+ Antecubital fossa    0.27        0.27                  +-----------------+-------------+----------+--------------+ Prox forearm         0.25        0.29                  +-----------------+-------------+----------+--------------+ Mid forearm                             not visualized +-----------------+-------------+----------+--------------+ Wrist                                   not visualized +-----------------+-------------+----------+--------------+ +-----------------+-------------+----------+--------------+ Right Basilic    Diameter (cm)Depth  (cm)   Findings    +-----------------+-------------+----------+--------------+ Prox upper arm       0.55        0.52                  +-----------------+-------------+----------+--------------+ Mid upper arm        0.48        0.50                  +-----------------+-------------+----------+--------------+ Dist upper arm       0.51        0.37                  +-----------------+-------------+----------+--------------+ Antecubital fossa    0.55        0.37                  +-----------------+-------------+----------+--------------+ Prox forearm         0.34        0.32     branching    +-----------------+-------------+----------+--------------+ Mid forearm          0.29        0.19                  +-----------------+-------------+----------+--------------+ Wrist                                   not visualized +-----------------+-------------+----------+--------------+ +-----------------+-------------+----------+--------------+ Left Cephalic    Diameter (cm)Depth (cm)   Findings    +-----------------+-------------+----------+--------------+ Prox upper arm       0.17        0.20                  +-----------------+-------------+----------+--------------+ Mid upper arm        0.19        0.26                  +-----------------+-------------+----------+--------------+ Dist upper arm  0.25        0.24                  +-----------------+-------------+----------+--------------+ Antecubital fossa    0.27        0.27                  +-----------------+-------------+----------+--------------+ Prox forearm         0.25        0.29     branching    +-----------------+-------------+----------+--------------+ Mid forearm          0.20        0.25                  +-----------------+-------------+----------+--------------+ Wrist                                   not visualized +-----------------+-------------+----------+--------------+  +-----------------+-------------+----------+---------+ Left Basilic     Diameter (cm)Depth (cm)Findings  +-----------------+-------------+----------+---------+ Prox upper arm       0.34        0.87   branching +-----------------+-------------+----------+---------+ Mid upper arm        0.28        0.62             +-----------------+-------------+----------+---------+ Dist upper arm       0.38        0.43             +-----------------+-------------+----------+---------+ Antecubital fossa    0.40        0.48             +-----------------+-------------+----------+---------+ Prox forearm         0.18        0.36   branching +-----------------+-------------+----------+---------+ Mid forearm          0.11        0.17             +-----------------+-------------+----------+---------+ Wrist                0.11        0.19   branching +-----------------+-------------+----------+---------+ *See table(s) above for measurements and observations.  Diagnosing physician: Harold Barban MD Electronically signed by Harold Barban MD on 01/03/2020 at 7:06:22 PM.    Final    Medications: Infusions: . sodium chloride 10 mL/hr at 01/02/20 0916  . ferric gluconate (FERRLECIT/NULECIT) IV 250 mg (01/03/20 1309)    Scheduled Medications: . amLODipine  10 mg Oral Daily  . aspirin EC  81 mg Oral Daily  . atorvastatin  80 mg Oral q1800  . calcium acetate  667 mg Oral TID WC  . Chlorhexidine Gluconate Cloth  6 each Topical Daily  . Chlorhexidine Gluconate Cloth  6 each Topical Q0600  . [START ON 01/05/2020] darbepoetin (ARANESP) injection - DIALYSIS  100 mcg Intravenous Q Mon-HD  . heparin  5,000 Units Subcutaneous Q8H  . hydrALAZINE  25 mg Oral Q8H  . insulin aspart  0-9 Units Subcutaneous Q4H  . sodium chloride flush  3 mL Intravenous Q12H  . ticagrelor  90 mg Oral BID    have reviewed scheduled and prn medications.  Physical Exam: General: alert, moving around in bed-- thru  interpreter - confused Heart: RRR Lungs: mostly clear Abdomen: soft, non tender Extremities: min edema-  Incisions from bypass  Dialysis Access: new right TDC     01/04/2020,8:56 AM  LOS: 4 days

## 2020-01-04 NOTE — TOC Initial Note (Signed)
Transition of Care [facility]) - Initial/Assessment Note    Patient Details  Name: Phillip Camacho MRN: [id number] Date of Birth: 1971-09-07  [facility]) CM/SW Contact:    Trula Ore, [place] Phone Number: 01/04/2020, 11:41 AM  Clinical Narrative:                  CSW spoke with patients son. Patient son request if there will be cost for SNF to let him know. Patients son was agreeable to SNF placement. Patients son requested for CSW to send out referrals for SNF placement.  CSW completed assessment and sent out refferals to SNFs in [place]. Pending bed offers.  Expected Discharge Plan: Skilled Nursing Facility Barriers to Discharge: Continued Medical Work up   Patient Goals and CMS Choice   CMS Medicare.gov Compare Post Acute Care list provided to:: Patient Represenative (must comment)(Jimmy (Son)) Choice offered to / list presented to : Adult Children(Son Jimmy)  Expected Discharge Plan and Services Expected Discharge Plan: [place] arrangements for the past 2 months: Single Family Home                                      Prior Living Arrangements/Services Living arrangements for the past 2 months: Single Family Home Lives with:: Self, Adult Children Patient language and need for interpreter reviewed:: Yes Do you feel safe going back to the place where you live?: No   Needs Short Term Rehab  Need for Family Participation in Patient Care: Yes (Comment) Care giver support system in place?: Yes (comment)   Criminal Activity/Legal Involvement Pertinent to Current Situation/Hospitalization: No - Comment as needed  Activities of Daily Living      Permission Sought/Granted Permission sought to share information with : Case Manager, Customer service manager, Family Supports Permission granted to share information with : Yes, Verbal Permission Granted  Share Information with NAME: [name]  Permission granted to share  info w AGENCY: SNFs  Permission granted to share info w Relationship: Son  Permission granted to share info w Contact Information: [name] [phone]  Emotional Assessment Appearance:: Appears stated age Attitude/Demeanor/Rapport: Unable to Assess Affect (typically observed): Unable to Assess Orientation: : Oriented to Self Alcohol / Substance Use: Not Applicable Psych Involvement: No (comment)  Admission diagnosis:  Hyperkalemia [E87.5] Pain of left lower extremity due to ischemia [E95.284, I99.8] Patient Active Problem List   Diagnosis Date Noted  . PAD (peripheral artery disease) ([place]) 12/31/2019  . Pain of left lower extremity due to ischemia   . Hyperlipidemia   . CKD (chronic kidney disease), stage IV ([place]) 11/14/2019  . Hypertension 11/14/2019  . Hyperkalemia 11/14/2019  . 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 Acute renal failure superimposed on stage 4 chronic kidney disease ([place]) 09/17/2019  . Nephrotic syndrome 09/17/2019  . Insulin-requiring or dependent type II diabetes mellitus ([place]) 09/17/2019  . Nephrotic syndrome in diabetes mellitus ([place]) 09/17/2019  . Acute CVA (cerebrovascular accident) ([place]) 08/11/2019  . DM (diabetes mellitus) ([place]) 08/11/2019  . Benign hypertension with chronic kidney disease, stage III 08/11/2019  . Tobacco abuse 08/11/2019  . CKD stage 4 due to type 2 diabetes mellitus ([place]) 08/11/2019   PCP:  [name], MD Pharmacy:   [place] [address] Phone: [phone] Fax: [phone]  Social Determinants of Health (SDOH) Interventions    Readmission Risk Interventions No flowsheet data found.

## 2020-01-04 NOTE — Progress Notes (Signed)
PROGRESS NOTE    Phillip Camacho  PFX:[id number] DOB: 1970/11/11 DOA: 12/31/2019 PCP: [name], MD   Brief Narrative: As per admission HPI: 49 y.o. 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  Last hospitalized in January 2021 for a stroke since then has been confused, not been eating or drinking, needing supervision with ADLs. In the ED found to have potassium 6.6, bicarbonate 17, BUN 50, and creatinine 5.69, up from 4.8 in January 2021.  CBC notable for leukocytosis to 14,500, stable normocytic anemia, and increased thrombocytosis with platelets of 748,000.  INR is normal.  Covid and influenza PCR are negative.    Patient received temporizing measures with insulin/dextrose, bicarbonate, and Lokelma, nephrology/vascular surgery consulted, patient was admitted for critical left leg ischemia and hyperkalemia and renal failure. 3/11-left femoral to below-knee popliteal bypass.  Patient seen by nephrology and also started on dialysis after placing TDC.  Subjective: Seen this am Alert,awake,resting comfortably some pain on the left leg.  Able to speak some [place]. No acute events overnight.  Afebrile.  Assessment & Plan:  Progressive CKD stage 4 due to poorly controlled diabetes and hypertension: nephro on board, work-up negative so far with negative HIV, hep C, hep B.  Holding home losartan.  Continue plan as per nephrology status post tunnel dialysis catheter placement and initiation of dialysis 3/12 and looking into long-term dialysis.  Vascular planned for vein mapping. Recent Labs  Lab 01/01/20 0251 01/01/20 2236 01/02/20 0651 01/03/20 0331 01/04/20 0153  BUN 50* 48* 46* 22* 27*  CREATININE 5.89* 5.77* 5.54* 3.64* 4.06*   Critical ischemia of left lower extremity:  S/p Left femoral to below-knee popliteal bypass with  reverse great saphenous vein 3/11.  Appreciate vascular input, monitor distal pulses.  Continue aspirin, Brilinta and Lipitor.  Pain control.  Severe Hyperkalemia: In the setting of AKI.  Status post temporizing measures/Lokelma and HD. Resolved Metabolic acidosis from renal dysfunction-bicarb improved . On hd Nephrotic syndrome presented to diabetic nephropathy.  Monitor.  Insulin-requiring or dependent type II diabetes mellitus, hemoglobin A1c is poorly controlled 8.6 on 1/23.  Blood sugar stable, we will continue sliding scale insulin.   Recent Labs  Lab 01/03/20 2034 01/04/20 0000 01/04/20 0438 01/04/20 0505 01/04/20 0807  GLUCAP 137* 138* 65* 72 95   History recurrent CVAs:Patient was admitted in January with recurrent CVA, likely embolic, has had increased weakness on the left and cognitive deficits since then. Cont home Brilinta, aspirin and Lipitor.    Hypertension, borderline controlled, continue home Norvasc,  As needed hydralazine.  Continue to dialyse.  HLD:on statins.  Tobacco HGD:JMEQ need to advise cessation.  Anemia of CKD-hemoglobin is stable at 8.3 g.Monitor transfuse for less than 7 g.    Social issues:Patient lives with his son who works during the day and has had difficulty caring for the patient who has been frequently confused since the recent strokes and is often unable to perform basic ADLs on his own.Likely need to look into skilled nursing facility.  TOC consulted  Body mass index is 20.76 kg/m.  DVT prophylaxis:Heparin. Code Status:full.  Family Communication:plan of care discussed with patient at bedside. Called son and updated-he agrees for skilled nursing facility.  Disposition Plan: Patient is from:home Anticipated Disposition:PT recommends SNF.  Son is at work during daytime unable to look after him and he agrees for  a skilled nursing facility. Barriers to discharge or conditions that needs to be met prior to discharge:[place] hospitalized for  management of critical ischemia status post bypass surgery, and with worsening renal failure now on dialysis.he will need a skilled nursing facility, once signed out by nephrology hopefully next 1 to 2 days   Consultants: Nephrology, vascular surgery.   Procedures:  3/11: Left femoral to below-knee popliteal bypass with reverse great saphenous vein  Microbiology:see note  Medications: Scheduled Meds: . amLODipine  10 mg Oral Daily  . aspirin EC  81 mg Oral Daily  . atorvastatin  80 mg Oral q1800  . calcium acetate  667 mg Oral TID WC  . Chlorhexidine Gluconate Cloth  6 each Topical Daily  . Chlorhexidine Gluconate Cloth  6 each Topical Q0600  . [START ON 01/05/2020] darbepoetin (ARANESP) injection - DIALYSIS  100 mcg Intravenous Q Mon-HD  . heparin  5,000 Units Subcutaneous Q8H  . hydrALAZINE  25 mg Oral Q8H  . insulin aspart  0-9 Units Subcutaneous Q4H  . sodium chloride flush  3 mL Intravenous Q12H  . ticagrelor  90 mg Oral BID   Continuous Infusions: . sodium chloride 10 mL/hr at 01/02/20 0916  . ferric gluconate (FERRLECIT/NULECIT) IV 250 mg (01/03/20 1309)    Antimicrobials: Anti-infectives (From admission, onward)   Start     Dose/Rate Route Frequency Ordered Stop   01/02/20 0927  ceFAZolin (ANCEF) 2-4 GM/100ML-% IVPB    Note to Pharmacy: [name]   : cabinet override      01/02/20 0927 01/02/20 0945   01/01/20 1600  ceFAZolin (ANCEF) IVPB 2g/100 mL premix     2 g 200 mL/hr over 30 Minutes Intravenous Every 8 hours 01/01/20 1224 01/02/20 0125   01/01/20 0706  ceFAZolin (ANCEF) 2-4 GM/100ML-% IVPB    Note to Pharmacy: Lelon Perla   : cabinet override      01/01/20 0706 01/01/20 1914   12/31/19 1745  ceFAZolin (ANCEF) IVPB 2g/100 mL premix     2 g 200 mL/hr over 30 Minutes Intravenous On call to O.R. 12/31/19 1730 01/01/20 0559       Objective: Vitals: Today's Vitals   01/03/20 1937 01/03/20 2210 01/04/20 0002 01/04/20 0440  BP: (!) 158/69  (!) 157/92  138/61  Pulse: 93  98 97  Resp: 10  16 16   Temp: 98.8 F (37.1 C)  99.6 F (37.6 C) 99.3 F (37.4 C)  TempSrc: Oral  Oral Oral  SpO2: 96%  96% 99%  Weight:      Height:      PainSc:  Asleep      Intake/Output Summary (Last 24 hours) at 01/04/2020 0723 Last data filed at 01/04/2020 0300 Gross per 24 hour  Intake 243 ml  Output 450 ml  Net -207 ml   Filed Weights   01/02/20 0921 01/02/20 1540 01/02/20 1750  Weight: 52.6 kg 56.6 kg 56.6 kg   Weight change:    Intake/Output from previous day: 03/13 0701 - 03/14 0700 In: 243 [P.O.:120; I.V.:3; IV Piggyback:120] Out: 450 [Urine:450] Intake/Output this shift: No intake/output data recorded.  Examination:  General exam: Alert awake, frail, baseline cognitive impairment but follows commands, interacts.  HEENT:Oral mucosa moist, Ear/Nose WNL grossly,dentition normal. Respiratory system: b/l clear, no crackles, no use of accessory muscles.  \ Cardiovascular system: S1 & S2 +, regular, No JVD. Gastrointestinal system: Abdomen soft, NT,ND, BS+. Nervous System:Alert, awake, left sided weakness  Extremities: No edema, distal peripheral pulses palpable w healing  surgical site on left leg.  Skin: No rashes,no icterus. MSK: Normal muscle bulk,tone,  Data Reviewed: I have personally reviewed following labs and imaging studies CBC: Recent Labs  Lab 12/31/19 1530 12/31/19 1530 01/01/20 0251 01/01/20 2236 01/02/20 0651 01/03/20 0331 01/04/20 0153  WBC 14.5*   < > 12.6* 13.7* 13.4* 13.4* 14.7*  NEUTROABS 10.4*  --  8.2*  --   --   --   --   HGB 10.6*   < > 10.2* 8.6* 8.4* 8.3* 7.9*  HCT 35.0*   < > 32.2* 27.6* 26.7* 26.3* 25.7*  MCV 91.6   < > 89.4 89.6 89.3 89.5 89.5  PLT 748*   < > 711* 587* 575* 483* 479*   < > = values in this interval not displayed.   Basic Metabolic Panel: Recent Labs  Lab 01/01/20 0251 01/01/20 0311 01/01/20 1225 01/01/20 2236 01/02/20 0340 01/02/20 0651 01/02/20 1123 01/03/20 0331  01/03/20 1050 01/03/20 1451 01/03/20 1845 01/04/20 0153  NA 143  --   --  143  --  141  --  144  --   --   --  140  K 5.6*  --    < > 4.9   < > 4.5   < > 4.0 4.1 4.4 4.0 3.6  CL 111  --   --  110  --  108  --  104  --   --   --  103  CO2 21*  --   --  18*  --  21*  --  26  --   --   --  26  GLUCOSE 120*  --   --  110*  --  125*  --  122*  --   --   --  72  BUN 50*  --   --  48*  --  46*  --  22*  --   --   --  27*  CREATININE 5.89*  --   --  5.77*  --  5.54*  --  3.64*  --   --   --  4.06*  CALCIUM 8.9  --   --  8.0*  --  8.2*  --  8.2*  --   --   --  8.4*  PHOS  --  7.1*  --   --   --   --   --   --   --   --   --  3.3   < > = values in this interval not displayed.   GFR: Estimated Creatinine Clearance: 17.8 mL/min (A) (by C-G formula based on SCr of 4.06 mg/dL (H)). Liver Function Tests: Recent Labs  Lab 01/01/20 0251 01/04/20 0153  AST 11*  --   ALT 13  --   ALKPHOS 108  --   BILITOT 0.5  --   PROT 6.4*  --   ALBUMIN 2.4* 1.7*   No results for input(s): LIPASE, AMYLASE in the last 168 hours. No results for input(s): AMMONIA in the last 168 hours. Coagulation Profile: Recent Labs  Lab 12/31/19 1530  INR 1.0   Cardiac Enzymes: No results for input(s): CKTOTAL, CKMB, CKMBINDEX, TROPONINI in the last 168 hours. BNP (last 3 results) No results for input(s): PROBNP in the last 8760 hours. HbA1C: No results for input(s): HGBA1C in the last 72 hours. CBG: Recent Labs  Lab 01/03/20 1820 01/03/20 2034 01/04/20 0000 01/04/20 0438 01/04/20 0505  GLUCAP 150* 137* 138* 65* 72   Lipid  Profile: No results for input(s): CHOL, HDL, LDLCALC, TRIG, CHOLHDL, LDLDIRECT in the last 72 hours. Thyroid Function Tests: No results for input(s): TSH, T4TOTAL, FREET4, T3FREE, THYROIDAB in the last 72 hours. Anemia Panel: No results for input(s): VITAMINB12, FOLATE, FERRITIN, TIBC, IRON, RETICCTPCT in the last 72 hours. Sepsis Labs: No results for input(s): PROCALCITON, LATICACIDVEN  in the last 168 hours.  Recent Results (from the past 240 hour(s))  Respiratory Panel by RT PCR (Flu A&B, Covid) - Nasopharyngeal Swab     Status: None   Collection Time: 12/31/19  5:23 PM   Specimen: Nasopharyngeal Swab  Result Value Ref Range Status   SARS Coronavirus 2 by RT PCR NEGATIVE NEGATIVE Final    Comment: (NOTE) SARS-CoV-2 target nucleic acids are NOT DETECTED. The SARS-CoV-2 RNA is generally detectable in upper respiratoy specimens during the acute phase of infection. The lowest concentration of SARS-CoV-2 viral copies this assay can detect is 131 copies/mL. A negative result does not preclude SARS-Cov-2 infection and should not be used as the sole basis for treatment or other patient management decisions. A negative result may occur with  improper specimen collection/handling, submission of specimen other than nasopharyngeal swab, presence of viral mutation(s) within the areas targeted by this assay, and inadequate number of viral copies (<131 copies/mL). A negative result must be combined with clinical observations, patient history, and epidemiological information. The expected result is Negative. Fact Sheet for Patients:  PinkCheek.be Fact Sheet for Healthcare Providers:  GravelBags.it This test is not yet ap proved or cleared by the [place] FDA and  has been authorized for detection and/or diagnosis of SARS-CoV-2 by FDA under an Emergency Use Authorization (EUA). This EUA will remain  in effect (meaning this test can be used) for the duration of the COVID-19 declaration under Section 564(b)(1) of the Act, 21 U.S.C. section 360bbb-3(b)(1), unless the authorization is terminated or revoked sooner.    Influenza A by PCR NEGATIVE NEGATIVE Final   Influenza B by PCR NEGATIVE NEGATIVE Final    Comment: (NOTE) The Xpert Xpress SARS-CoV-2/FLU/RSV assay is intended as an aid in  the diagnosis of influenza  from Nasopharyngeal swab specimens and  should not be used as a sole basis for treatment. Nasal washings and  aspirates are unacceptable for Xpert Xpress SARS-CoV-2/FLU/RSV  testing. Fact Sheet for Patients: PinkCheek.be Fact Sheet for Healthcare Providers: GravelBags.it This test is not yet approved or cleared by the [place] FDA and  has been authorized for detection and/or diagnosis of SARS-CoV-2 by  FDA under an Emergency Use Authorization (EUA). This EUA will remain  in effect (meaning this test can be used) for the duration of the  Covid-19 declaration under Section 564(b)(1) of the Act, 21  U.S.C. section 360bbb-3(b)(1), unless the authorization is  terminated or revoked. Performed at [facility], [place] [address], [place], [place] [address]   Surgical pcr screen     Status: Abnormal   Collection Time: 01/01/20  4:27 AM   Specimen: Nasal Mucosa; Nasal Swab  Result Value Ref Range Status   MRSA, PCR NEGATIVE NEGATIVE Final   Staphylococcus aureus POSITIVE (A) NEGATIVE Final    Comment: (NOTE) The Xpert SA Assay (FDA approved for NASAL specimens in patients 55 years of age and older), is one component of a comprehensive surveillance program. It is not intended to diagnose infection nor to guide or monitor treatment. Performed at [facility], [place] [address] [place], [place] [address]  Radiology Studies: DG CHEST PORT 1 VIEW  Result Date: 01/02/2020 CLINICAL DATA:  Renal failure; post-op dialysis catheter placement. Hx of diabetes, stroke, loop recorder insertion(07/2019), TEE w/o cardioversion(07/2019). Smoker. EXAM: PORTABLE CHEST 1 VIEW COMPARISON:  11/14/2019 FINDINGS: Right dialysis catheter tip: Right atrium. Loop recorder noted. Low lung volumes are present, causing crowding of the pulmonary vasculature. Borderline enlargement of the cardiopericardial silhouette. The lungs appear clear. No  blunting of the costophrenic angles. IMPRESSION: 1. Right dialysis catheter tip: Right atrium.  No pneumothorax. 2. Borderline enlargement of the cardiopericardial silhouette. Electronically Signed   By: Van Clines M.D.   On: 01/02/2020 11:54   DG Fluoro Guide CV Line-No Report  Result Date: 01/02/2020 Fluoroscopy was utilized by the requesting physician.  No radiographic interpretation.   VAS [place] ABI WITH/WO TBI  Result Date: 01/03/2020 LOWER EXTREMITY DOPPLER STUDY Indications: Rest pain. High Risk Factors: Hypertension, Diabetes, prior CVA. Other Factors: CKD,.  Vascular Interventions: S/P Left leg femoral to below knee popliteal bypass. Comparison Study: No prior Performing Technologist: Sharion Dove RVS  Examination Guidelines: A complete evaluation includes at minimum, Doppler waveform signals and systolic blood pressure reading at the level of bilateral brachial, anterior tibial, and posterior tibial arteries, when vessel segments are accessible. Bilateral testing is considered an integral part of a complete examination. Photoelectric Plethysmograph (PPG) waveforms and toe systolic pressure readings are included as required and additional duplex testing as needed. Limited examinations for reoccurring indications may be performed as noted.  ABI Findings: +---------+------------------+-----+---------+--------+ Right    Rt Pressure (mmHg)IndexWaveform Comment  +---------+------------------+-----+---------+--------+ Brachial 179                    triphasic         +---------+------------------+-----+---------+--------+ PTA      117               0.65                   +---------+------------------+-----+---------+--------+ DP       109               0.60                   +---------+------------------+-----+---------+--------+ Great Toe31                0.17                   +---------+------------------+-----+---------+--------+  +---------+------------------+-----+---------+-------+ Left     Lt Pressure (mmHg)IndexWaveform Comment +---------+------------------+-----+---------+-------+ Brachial 181                    triphasic        +---------+------------------+-----+---------+-------+ PTA      74                0.41                  +---------+------------------+-----+---------+-------+ DP       81                0.45                  +---------+------------------+-----+---------+-------+ Great Toe22                0.12                  +---------+------------------+-----+---------+-------+ +-------+-----------+-----------+------------+------------+ ABI/TBIToday's ABIToday's TBIPrevious ABIPrevious TBI +-------+-----------+-----------+------------+------------+ Right  0.65       0.17                                +-------+-----------+-----------+------------+------------+  Left   0.45       0.12                                +-------+-----------+-----------+------------+------------+  Summary: Right: Resting right ankle-brachial index indicates moderate right lower extremity arterial disease. The right toe-brachial index is abnormal. Left: Resting left ankle-brachial index indicates severe left lower extremity arterial disease. The left toe-brachial index is abnormal.  *See table(s) above for measurements and observations.  Electronically signed by Harold Barban MD on 01/03/2020 at 68:05:55 PM.    Final    VAS [place] UPPER EXT VEIN MAPPING (PRE-OP AVF)  Result Date: 01/03/2020 UPPER EXTREMITY VEIN MAPPING  Indications: Pre-dialysis access. Limitations: bandages/IVs bilaterally Comparison Study: No prior Performing Technologist: Sharion Dove RVS  Examination Guidelines: A complete evaluation includes B-mode imaging, spectral Doppler, color Doppler, and power Doppler as needed of all accessible portions of each vessel. Bilateral testing is considered an integral part of a complete  examination. Limited examinations for reoccurring indications may be performed as noted. +-----------------+-------------+----------+--------------+ Right Cephalic   Diameter (cm)Depth (cm)   Findings    +-----------------+-------------+----------+--------------+ Prox upper arm                          not visualized +-----------------+-------------+----------+--------------+ Mid upper arm                           not visualized +-----------------+-------------+----------+--------------+ Dist upper arm                          not visualized +-----------------+-------------+----------+--------------+ Antecubital fossa    0.27        0.27                  +-----------------+-------------+----------+--------------+ Prox forearm         0.25        0.29                  +-----------------+-------------+----------+--------------+ Mid forearm                             not visualized +-----------------+-------------+----------+--------------+ Wrist                                   not visualized +-----------------+-------------+----------+--------------+ +-----------------+-------------+----------+--------------+ Right Basilic    Diameter (cm)Depth (cm)   Findings    +-----------------+-------------+----------+--------------+ Prox upper arm       0.55        0.52                  +-----------------+-------------+----------+--------------+ Mid upper arm        0.48        0.50                  +-----------------+-------------+----------+--------------+ Dist upper arm       0.51        0.37                  +-----------------+-------------+----------+--------------+ Antecubital fossa    0.55        0.37                  +-----------------+-------------+----------+--------------+ Prox forearm  0.34        0.32     branching    +-----------------+-------------+----------+--------------+ Mid forearm          0.29        0.19                   +-----------------+-------------+----------+--------------+ Wrist                                   not visualized +-----------------+-------------+----------+--------------+ +-----------------+-------------+----------+--------------+ Left Cephalic    Diameter (cm)Depth (cm)   Findings    +-----------------+-------------+----------+--------------+ Prox upper arm       0.17        0.20                  +-----------------+-------------+----------+--------------+ Mid upper arm        0.19        0.26                  +-----------------+-------------+----------+--------------+ Dist upper arm       0.25        0.24                  +-----------------+-------------+----------+--------------+ Antecubital fossa    0.27        0.27                  +-----------------+-------------+----------+--------------+ Prox forearm         0.25        0.29     branching    +-----------------+-------------+----------+--------------+ Mid forearm          0.20        0.25                  +-----------------+-------------+----------+--------------+ Wrist                                   not visualized +-----------------+-------------+----------+--------------+ +-----------------+-------------+----------+---------+ Left Basilic     Diameter (cm)Depth (cm)Findings  +-----------------+-------------+----------+---------+ Prox upper arm       0.34        0.87   branching +-----------------+-------------+----------+---------+ Mid upper arm        0.28        0.62             +-----------------+-------------+----------+---------+ Dist upper arm       0.38        0.43             +-----------------+-------------+----------+---------+ Antecubital fossa    0.40        0.48             +-----------------+-------------+----------+---------+ Prox forearm         0.18        0.36   branching +-----------------+-------------+----------+---------+ Mid forearm          0.11         0.17             +-----------------+-------------+----------+---------+ Wrist                0.11        0.19   branching +-----------------+-------------+----------+---------+ *See table(s) above for measurements and observations.  Diagnosing physician: Harold Barban MD Electronically signed by Harold Barban MD on 01/03/2020 at 7:06:22 PM.    Final      LOS:  4 days   Time spent: More than 50% of that time was spent in counseling and/or coordination of care.  Antonieta Pert, MD [facility]  01/04/2020, 7:23 AM

## 2020-01-05 ENCOUNTER — Encounter (HOSPITAL_COMMUNITY)
Admission: EM | Disposition: A | Discharge status: Hospice | Payer: Self-pay | Source: Home / Self Care | Attending: Internal Medicine

## 2020-01-05 ENCOUNTER — Inpatient Hospital Stay (HOSPITAL_COMMUNITY): Payer: 59 | Admitting: Certified Registered"

## 2020-01-05 ENCOUNTER — Encounter (HOSPITAL_COMMUNITY): Payer: Self-pay | Admitting: Family Medicine

## 2020-01-05 HISTORY — PX: AV FISTULA PLACEMENT: SHX1204

## 2020-01-05 LAB — GLUCOSE, CAPILLARY
Glucose-Capillary: 102 mg/dL — ABNORMAL HIGH (ref 70–99)
Glucose-Capillary: 97 mg/dL (ref 70–99)
Glucose-Capillary: 98 mg/dL (ref 70–99)
Glucose-Capillary: 97 mg/dL (ref 70–99)
Glucose-Capillary: 95 mg/dL (ref 70–99)
Glucose-Capillary: 77 mg/dL (ref 70–99)

## 2020-01-05 LAB — RENAL FUNCTION PANEL
Albumin: 1.8 g/dL — ABNORMAL LOW (ref 3.5–5.0)
Anion gap: 9 (ref 5–15)
BUN: 31 mg/dL — ABNORMAL HIGH (ref 6–20)
CO2: 26 mmol/L (ref 22–32)
Calcium: 8.6 mg/dL — ABNORMAL LOW (ref 8.9–10.3)
Chloride: 106 mmol/L (ref 98–111)
Creatinine, Ser: 4.72 mg/dL — ABNORMAL HIGH (ref 0.61–1.24)
GFR calc Af Amer: 16 mL/min — ABNORMAL LOW (ref >60)
GFR calc non Af Amer: 14 mL/min — ABNORMAL LOW (ref >60)
Glucose, Bld: 103 mg/dL — ABNORMAL HIGH (ref 70–99)
Phosphorus: 3.6 mg/dL (ref 2.5–4.6)
Potassium: 4.2 mmol/L (ref 3.5–5.1)
Sodium: 141 mmol/L (ref 135–145)

## 2020-01-05 LAB — PARATHYROID HORMONE, INTACT (NO CA): PTH: 35 pg/mL (ref 15–65)

## 2020-01-05 LAB — CBC
HCT: 25.9 % — ABNORMAL LOW (ref 39.0–52.0)
Hemoglobin: 8.1 g/dL — ABNORMAL LOW (ref 13.0–17.0)
MCH: 28.7 pg (ref 26.0–34.0)
MCHC: 31.3 g/dL (ref 30.0–36.0)
MCV: 91.8 fL (ref 80.0–100.0)
Platelets: 531 10*3/uL — ABNORMAL HIGH (ref 150–400)
RBC: 2.82 MIL/uL — ABNORMAL LOW (ref 4.22–5.81)
RDW: 15 % (ref 11.5–15.5)
WBC: 17 10*3/uL — ABNORMAL HIGH (ref 4.0–10.5)
nRBC: 0 % (ref 0.0–0.2)

## 2020-01-05 LAB — PROTIME-INR
INR: 1 (ref 0.8–1.2)
Prothrombin Time: 13.5 seconds (ref 11.4–15.2)

## 2020-01-05 LAB — FACTOR 5 LEIDEN

## 2020-01-05 SURGERY — ARTERIOVENOUS (AV) FISTULA CREATION
Anesthesia: Monitor Anesthesia Care | Site: Arm Lower | Laterality: Left

## 2020-01-05 MED ORDER — DARBEPOETIN ALFA 100 MCG/0.5ML IJ SOSY
PREFILLED_SYRINGE | INTRAMUSCULAR | Status: AC
  Filled 2020-01-05: qty 0.5

## 2020-01-05 MED ORDER — CEFAZOLIN SODIUM 1 G IJ SOLR
INTRAMUSCULAR | Status: AC
  Filled 2020-01-05: qty 20

## 2020-01-05 MED ORDER — FENTANYL CITRATE (PF) 100 MCG/2ML IJ SOLN
INTRAMUSCULAR | Status: DC | PRN
  Administered 2020-01-05: 25 ug via INTRAVENOUS

## 2020-01-05 MED ORDER — SODIUM CHLORIDE 0.9 % IV SOLN: INTRAVENOUS | Status: DC | PRN

## 2020-01-05 MED ORDER — OXYCODONE HCL 5 MG PO TABS: 5.0000 mg | ORAL_TABLET | Freq: Once | ORAL | Status: DC | PRN

## 2020-01-05 MED ORDER — PHENYLEPHRINE 40 MCG/ML (10ML) SYRINGE FOR IV PUSH (FOR BLOOD PRESSURE SUPPORT)
PREFILLED_SYRINGE | INTRAVENOUS | Status: DC | PRN
  Administered 2020-01-05: 80 ug via INTRAVENOUS

## 2020-01-05 MED ORDER — SODIUM CHLORIDE 0.9 % IV SOLN
INTRAVENOUS | Status: AC
  Filled 2020-01-05: qty 1.2

## 2020-01-05 MED ORDER — FENTANYL CITRATE (PF) 100 MCG/2ML IJ SOLN: 25.0000 ug | INTRAMUSCULAR | Status: DC | PRN

## 2020-01-05 MED ORDER — FENTANYL CITRATE (PF) 250 MCG/5ML IJ SOLN
INTRAMUSCULAR | Status: AC
  Filled 2020-01-05: qty 5

## 2020-01-05 MED ORDER — MUPIROCIN 2 % EX OINT
1.0000 "application " | TOPICAL_OINTMENT | Freq: Two times a day (BID) | CUTANEOUS | Status: AC
  Administered 2020-01-05 – 2020-01-09 (×9): 1 via NASAL
  Filled 2020-01-05 (×3): qty 22

## 2020-01-05 MED ORDER — SODIUM CHLORIDE (PF) 0.9 % IJ SOLN
INTRAMUSCULAR | Status: AC
  Filled 2020-01-05: qty 10

## 2020-01-05 MED ORDER — LIDOCAINE-EPINEPHRINE 0.5 %-1:200000 IJ SOLN
INTRAMUSCULAR | Status: AC
  Filled 2020-01-05: qty 1

## 2020-01-05 MED ORDER — OXYCODONE HCL 5 MG/5ML PO SOLN: 5.0000 mg | Freq: Once | ORAL | Status: DC | PRN

## 2020-01-05 MED ORDER — ACETAMINOPHEN 325 MG PO TABS: 325.0000 mg | ORAL_TABLET | ORAL | Status: DC | PRN

## 2020-01-05 MED ORDER — MEPERIDINE HCL 25 MG/ML IJ SOLN: 6.2500 mg | INTRAMUSCULAR | Status: DC | PRN

## 2020-01-05 MED ORDER — ONDANSETRON HCL 4 MG/2ML IJ SOLN
INTRAMUSCULAR | Status: AC
  Filled 2020-01-05: qty 2

## 2020-01-05 MED ORDER — ONDANSETRON HCL 4 MG/2ML IJ SOLN
INTRAMUSCULAR | Status: DC | PRN
  Administered 2020-01-05: 4 mg via INTRAVENOUS

## 2020-01-05 MED ORDER — LIDOCAINE-EPINEPHRINE 0.5 %-1:200000 IJ SOLN
INTRAMUSCULAR | Status: DC | PRN
  Administered 2020-01-05: 50 mL

## 2020-01-05 MED ORDER — HEPARIN SODIUM (PORCINE) 1000 UNIT/ML IJ SOLN
INTRAMUSCULAR | Status: AC
  Administered 2020-01-05: 13:00:00 3000 [IU]
  Filled 2020-01-05: qty 3

## 2020-01-05 MED ORDER — PROPOFOL 10 MG/ML IV BOLUS
INTRAVENOUS | Status: AC
  Filled 2020-01-05: qty 20

## 2020-01-05 MED ORDER — PROPOFOL 500 MG/50ML IV EMUL
INTRAVENOUS | Status: DC | PRN
  Administered 2020-01-05: 100 ug/kg/min via INTRAVENOUS

## 2020-01-05 MED ORDER — STERILE WATER FOR IRRIGATION IR SOLN
Status: DC | PRN
  Administered 2020-01-05: 1000 mL

## 2020-01-05 MED ORDER — SODIUM CHLORIDE 0.9 % IV SOLN
INTRAVENOUS | Status: DC | PRN
  Administered 2020-01-05: 500 mL

## 2020-01-05 MED ORDER — 0.9 % SODIUM CHLORIDE (POUR BTL) OPTIME
TOPICAL | Status: DC | PRN
  Administered 2020-01-05: 1000 mL

## 2020-01-05 MED ORDER — ACETAMINOPHEN 160 MG/5ML PO SOLN: 325.0000 mg | ORAL | Status: DC | PRN

## 2020-01-05 MED ORDER — LIDOCAINE 2% (20 MG/ML) 5 ML SYRINGE
INTRAMUSCULAR | Status: AC
  Filled 2020-01-05: qty 5

## 2020-01-05 MED ORDER — ONDANSETRON HCL 4 MG/2ML IJ SOLN: 4.0000 mg | Freq: Once | INTRAMUSCULAR | Status: DC | PRN

## 2020-01-05 SURGICAL SUPPLY — 31 items
ADH SKN CLS APL DERMABOND .7 (GAUZE/BANDAGES/DRESSINGS) ×1
ARMBAND PINK RESTRICT EXTREMIT (MISCELLANEOUS) ×4 IMPLANT
CANISTER SUCT 3000ML PPV (MISCELLANEOUS) ×3 IMPLANT
CANNULA VESSEL 3MM 2 BLNT TIP (CANNULA) ×3 IMPLANT
CLIP LIGATING EXTRA MED SLVR (CLIP) ×3 IMPLANT
CLIP LIGATING EXTRA SM BLUE (MISCELLANEOUS) ×3 IMPLANT
COVER PROBE W GEL 5X96 (DRAPES) ×3 IMPLANT
COVER WAND RF STERILE (DRAPES) ×3 IMPLANT
DECANTER SPIKE VIAL GLASS SM (MISCELLANEOUS) ×3 IMPLANT
DERMABOND ADVANCED (GAUZE/BANDAGES/DRESSINGS) ×2
DERMABOND ADVANCED .7 DNX12 (GAUZE/BANDAGES/DRESSINGS) ×1 IMPLANT
ELECT REM PT RETURN 9FT ADLT (ELECTROSURGICAL) ×3
ELECTRODE REM PT RTRN 9FT ADLT (ELECTROSURGICAL) ×1 IMPLANT
GLOVE BIOGEL PI IND STRL 7.5 (GLOVE) IMPLANT
GLOVE BIOGEL PI INDICATOR 7.5 (GLOVE) ×4
GLOVE ECLIPSE 6.5 STRL STRAW (GLOVE) ×2 IMPLANT
GLOVE SS BIOGEL STRL SZ 7.5 (GLOVE) ×1 IMPLANT
GLOVE SUPERSENSE BIOGEL SZ 7.5 (GLOVE) ×2
GOWN STRL REUS W/ TWL LRG LVL3 (GOWN DISPOSABLE) ×3 IMPLANT
GOWN STRL REUS W/TWL LRG LVL3 (GOWN DISPOSABLE) ×9
KIT BASIN OR (CUSTOM PROCEDURE TRAY) ×3 IMPLANT
KIT TURNOVER KIT B (KITS) ×3 IMPLANT
NS IRRIG 1000ML POUR BTL (IV SOLUTION) ×3 IMPLANT
PACK CV ACCESS (CUSTOM PROCEDURE TRAY) ×3 IMPLANT
PAD ARMBOARD 7.5X6 YLW CONV (MISCELLANEOUS) ×6 IMPLANT
SUT PROLENE 6 0 CC (SUTURE) ×3 IMPLANT
SUT VIC AB 3-0 SH 27 (SUTURE) ×3
SUT VIC AB 3-0 SH 27X BRD (SUTURE) ×1 IMPLANT
TOWEL GREEN STERILE (TOWEL DISPOSABLE) ×3 IMPLANT
UNDERPAD 30X30 (UNDERPADS AND DIAPERS) ×3 IMPLANT
WATER STERILE IRR 1000ML POUR (IV SOLUTION) ×3 IMPLANT

## 2020-01-05 NOTE — Progress Notes (Signed)
[facility] Progress Note   Assessment/ Plan:   Pt is a 49 y.o. yo male with DM. HTN, history of CVA's and progressive CKD-  Followed by Dr. Durwin Reges mose recently who was admitted on 12/31/2019 with and ischemic foot   Assessment/Plan: 1. Renal-  Progressive CKD, failure to thrive and many episodes of hyperkalemia so decision has been made to go ahead and initiate chronic dialysis for ESRD.  S/p [facility] and first HD treatment 3/12  Second treatment Monday 3/15  Have spoken to vascular to consider permanent access placement- spoke to son about it as well.  Will also need to initiate search for OP HD unit- lives in [place] but seem to be looking at SNF as pt cannot be left alone and son works.  Renal navigator on the case.  2. Hyperkalemia-  Improved with medical treatment -  stopped lokelma since starting HD 3. Anemia- iron stores low-  Need to repleting with ferrlecit  and also started ESA 4. Secondary hyperparathyroidism- phos high, start binder PTH pending still 5. HTN/volume-  BP high- norvasc 10 and prn hydralazine, will change to scheduled- BP much better 6. Metabolic acidosis-  resolved 7. Ischemic foot- s/p fempop bypass per vasc-  patent today  8.  Multiple CVA's-  On secondary prevention.  Strange for 49 year old to have all of these vascular issues.  Just DM or is there another unifying diagnosis ?? 8.  Dispo-  Needs SNF due to confusion and eyesight.    Subjective:    Seen on HD.  S/p AVF creation this AM.  Sleeping, arousable, moaning   Objective:   BP 103/75   Pulse 98   Temp 98.2 F (36.8 C) (Oral)   Resp 18   Ht 5\' 5"  (1.651 m)   Wt 56.5 kg   SpO2 100%   BMI 20.73 kg/m   Physical Exam: Gen: appears uncomfortable CVS: RRR Resp: clear bilaterally  Abd: soft Ext: no LE edema ACCESS: R IJ TDC, L AVF + T/B  Labs: BMET Recent Labs  Lab 12/31/19 1620 12/31/19 1743 01/01/20 0251 01/01/20 0311 01/01/20 1225 01/01/20 2236 01/02/20 0340  01/02/20 0651 01/02/20 1123 01/02/20 2315 01/03/20 0331 01/03/20 1050 01/03/20 1451 01/03/20 1845 01/04/20 0153 01/05/20 0235  NA 138  --  143  --   --  143  --  141  --   --  144  --   --   --  140 141  K 6.6*   < > 5.6*  --    < > 4.9   < > 4.5   < > 4.4 4.0 4.1 4.4 4.0 3.6 4.2  CL 109  --  111  --   --  110  --  108  --   --  104  --   --   --  103 106  CO2 17*  --  21*  --   --  18*  --  21*  --   --  26  --   --   --  26 26  GLUCOSE 119*  --  120*  --   --  110*  --  125*  --   --  122*  --   --   --  72 103*  BUN 50*  --  50*  --   --  48*  --  46*  --   --  22*  --   --   --  27* 31*  CREATININE 5.69*  --  5.89*  --   --  5.77*  --  5.54*  --   --  3.64*  --   --   --  4.06* 4.72*  CALCIUM 8.9  --  8.9  --   --  8.0*  --  8.2*  --   --  8.2*  --   --   --  8.4* 8.6*  PHOS  --   --   --  7.1*  --   --   --   --   --   --   --   --   --   --  3.3 3.6   < > = values in this interval not displayed.   CBC Recent Labs  Lab 12/31/19 1530 12/31/19 1530 01/01/20 0251 01/01/20 2236 01/02/20 0651 01/03/20 0331 01/04/20 0153 01/05/20 0235  WBC 14.5*   < > 12.6*   < > 13.4* 13.4* 14.7* 17.0*  NEUTROABS 10.4*  --  8.2*  --   --   --   --   --   HGB 10.6*   < > 10.2*   < > 8.4* 8.3* 7.9* 8.1*  HCT 35.0*   < > 32.2*   < > 26.7* 26.3* 25.7* 25.9*  MCV 91.6   < > 89.4   < > 89.3 89.5 89.5 91.8  PLT 748*   < > 711*   < > 575* 483* 479* 531*   < > = values in this interval not displayed.      Medications:    . amLODipine  10 mg Oral Daily  . aspirin EC  81 mg Oral Daily  . atorvastatin  80 mg Oral q1800  . calcium acetate  667 mg Oral TID WC  . Chlorhexidine Gluconate Cloth  6 each Topical Q0600  . Darbepoetin Alfa      . darbepoetin (ARANESP) injection - DIALYSIS  100 mcg Intravenous Q Mon-HD  . heparin  5,000 Units Subcutaneous Q8H  . hydrALAZINE  25 mg Oral Q8H  . insulin aspart  0-9 Units Subcutaneous Q4H  . sodium chloride flush  3 mL Intravenous Q12H  . ticagrelor  90  mg Oral BID     Madelon Lips, MD 01/05/2020, 1:31 PM

## 2020-01-05 NOTE — Transfer of Care (Signed)
Immediate Anesthesia Transfer of Care Note  Patient: Phillip Camacho  Procedure(s) Performed: LEFT ARTERIOVENOUS (AV) FISTULA CREATION (Left Arm Lower)  Patient Location: PACU  Anesthesia Type:General  Level of Consciousness: awake, alert  and oriented  Airway & Oxygen Therapy: Patient Spontanous Breathing and Patient connected to face mask oxygen  Post-op Assessment: Report given to RN and Post -op Vital signs reviewed and stable  Post vital signs: Reviewed and stable  Last Vitals:  Vitals Value Taken Time  BP 108/59 01/05/20 0857  Temp    Pulse 93 01/05/20 0904  Resp 9 01/05/20 0904  SpO2 98 % 01/05/20 0904  Vitals shown include unvalidated device data.  Last Pain:  Vitals:   01/05/20 0435  TempSrc: Oral  PainSc:          Complications: No apparent anesthesia complications

## 2020-01-05 NOTE — Procedures (Signed)
Patient seen and examined on Hemodialysis. BP (!) 125/54   Pulse 100   Temp 98.2 F (36.8 C) (Oral)   Resp 18   Ht 5\' 5"  (1.651 m)   Wt 56.5 kg   SpO2 100%   BMI 20.73 kg/m   QB 300 mL/ min via TDC UF goal even  Sleeping, arousable.    Tolerating treatment without complaints at this time.   Madelon Lips MD [facility] pgr [phone] 1:36 PM

## 2020-01-05 NOTE — Progress Notes (Signed)
PROGRESS NOTE    Phillip Camacho  QQV:[id number] DOB: [birth year] DOA: 12/31/2019 PCP: [name], MD   Brief Narrative: As per admission HPI: 49 y.o. 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  Last hospitalized in January 2021 for a stroke since then has been confused, not been eating or drinking, needing supervision with ADLs. In the ED found to have potassium 6.6, bicarbonate 17, BUN 50, and creatinine 5.69, up from 4.8 in January 2021.  CBC notable for leukocytosis to 14,500, stable normocytic anemia, and increased thrombocytosis with platelets of 748,000.  INR is normal.  Covid and influenza PCR are negative.    Patient received temporizing measures with insulin/dextrose, bicarbonate, and Lokelma, nephrology/vascular surgery consulted, patient was admitted for critical left leg ischemia and hyperkalemia and renal failure. 3/11-left femoral to below-knee popliteal bypass.  Patient seen by nephrology and also started on dialysis after placing TDC.  Subjective: Seen in dialysis getting hemodialysis. Underwent upper arm brachiocephalic fistula this morning. No new complaints.  Alert awake, baseline cognitive impairment. Moving his extremities. Assessment & Plan:  Progressive CKD stage 4 due to poorly controlled diabetes and hypertension: nephro on board, work-up negative so far with negative HIV, hep C, hep B.  Holding home losartan. status post tunnel dialysis catheter placement and initiation of dialysis 3/12, AV fistula placed 3/15 ongoing dialysis again.  He will need long-term dialysis.  Patient will need clip and skilled nursing facility placement.  Discussed with Nephrology and is stable for discharge once SNF placement and clip completed Recent Labs  Lab 01/01/20 2236 01/02/20 0651 01/03/20 0331 01/04/20 0153  01/05/20 0235  BUN 48* 46* 22* 27* 31*  CREATININE 5.77* 5.54* 3.64* 4.06* 4.72*   Critical ischemia of left lower extremity:  S/p Left femoral to below-knee popliteal bypass with reverse great saphenous vein 3/11.  Appreciate vascular input, monitor distal pulses.  Continue aspirin, Brilinta and Lipitor.  Pain control.  Severe Hyperkalemia: In the setting of AKI.  Status post temporizing measures/Lokelma and HD. Resolved Metabolic acidosis from renal dysfunction-bicarb improved . On hd Nephrotic syndrome presented to diabetic nephropathy.  Monitor.  Insulin-requiring or dependent type II diabetes mellitus, hemoglobin A1c is poorly controlled 8.6 on 1/23.  Blood sugar stable, we will continue sliding scale insulin.   Recent Labs  Lab 01/04/20 2045 01/05/20 0015 01/05/20 0432 01/05/20 0701 01/05/20 0905  GLUCAP 112* 102* 97 97 98   History recurrent CVAs:Patient was admitted in January with recurrent CVA, likely embolic, has had increased weakness on the left and cognitive deficits since then. Cont home Brilinta, aspirin and Lipitor.    Hypertension,controlled, continue home Norvasc,  As needed hydralazine.  Continue to dialyse.  HLD:on statins.  Tobacco PPI:RJJO need to advise cessation.  Anemia of CKD-hemoglobin is stable at 8.3 g.Monitor transfuse for less than 7 g.    Social issues:Patient lives with his son who works during the day and has had difficulty caring for the patient who has been frequently confused since the recent strokes and is often unable to perform basic ADLs on his own.Likely need to look into skilled nursing facility.  TOC consulted  Body mass index is 20.73 kg/m.  DVT prophylaxis:Heparin. Code Status:full.  Family Communication:plan of care discussed with patient at bedside. I had called son and updated-he agrees for skilled nursing facility.  Disposition Plan: Patient is  from:home Anticipated Disposition:PT recommends SNF.  Son is at work during  daytime unable to look after him and he agrees for a skilled nursing facility. Barriers to discharge or conditions that needs to be met prior to discharge: Patient was admitted with progressive CKD and critical ischemia of left leg status post bypass surgery, and with worsening renal failure now on dialysis.  Discussed with Nephrology and is stable for discharge once SNF placement and clip completed TOC working on placement.  Consultants: Nephrology, vascular surgery.   Procedures:  3/11: Left femoral to below-knee popliteal bypass with reverse great saphenous vein  Microbiology:see note  Medications: Scheduled Meds: . amLODipine  10 mg Oral Daily  . aspirin EC  81 mg Oral Daily  . atorvastatin  80 mg Oral q1800  . calcium acetate  667 mg Oral TID WC  . Chlorhexidine Gluconate Cloth  6 each Topical Q0600  . Darbepoetin Alfa      . darbepoetin (ARANESP) injection - DIALYSIS  100 mcg Intravenous Q Mon-HD  . heparin  5,000 Units Subcutaneous Q8H  . hydrALAZINE  25 mg Oral Q8H  . insulin aspart  0-9 Units Subcutaneous Q4H  . sodium chloride flush  3 mL Intravenous Q12H  . ticagrelor  90 mg Oral BID   Continuous Infusions: . sodium chloride 10 mL/hr at 01/02/20 0916  . ferric gluconate (FERRLECIT/NULECIT) IV 250 mg (01/05/20 1000)    Antimicrobials: Anti-infectives (From admission, onward)   Start     Dose/Rate Route Frequency Ordered Stop   01/05/20 0000  ceFAZolin (ANCEF) IVPB 1 g/50 mL premix    Note to Pharmacy: Send with pt to OR   1 g 100 mL/hr over 30 Minutes Intravenous On call 01/04/20 1134 01/05/20 0735   01/02/20 0927  ceFAZolin (ANCEF) 2-4 GM/100ML-% IVPB    Note to Pharmacy: [name]   : cabinet override      01/02/20 0927 01/02/20 0945   01/01/20 1600  ceFAZolin (ANCEF) IVPB 2g/100 mL premix     2 g 200 mL/hr over 30 Minutes Intravenous Every 8 hours 01/01/20 1224 01/02/20 0125   01/01/20 0706  ceFAZolin (ANCEF) 2-4 GM/100ML-% IVPB    Note to Pharmacy:  Lelon Perla   : cabinet override      01/01/20 0706 01/01/20 1914   12/31/19 1745  ceFAZolin (ANCEF) IVPB 2g/100 mL premix     2 g 200 mL/hr over 30 Minutes Intravenous On call to O.R. 12/31/19 1730 01/01/20 0559       Objective: Vitals: Today's Vitals   01/05/20 1116 01/05/20 1130 01/05/20 1200 01/05/20 1230  BP: 124/64 105/69 130/67 109/74  Pulse: 96 99 99 97  Resp:      Temp:      TempSrc:      SpO2:      Weight:      Height:      PainSc:        Intake/Output Summary (Last 24 hours) at 01/05/2020 1259 Last data filed at 01/05/2020 0905 Gross per 24 hour  Intake 400 ml  Output 10 ml  Net 390 ml   Filed Weights   01/02/20 1540 01/02/20 1750 01/05/20 1110  Weight: 56.6 kg 56.6 kg 56.5 kg   Weight change:    Intake/Output from previous day: 03/14 0701 - 03/15 0700 In: 3 [I.V.:3] Out: -  Intake/Output this shift: Total I/O In: 400 [I.V.:400] Out: 10 [Blood:10]  Examination:  General exam: Alert awake, with baseline cognitive impairment but interactive follows commands  appropriately.   HEENT:Oral mucosa moist, Ear/Nose WNL grossly,dentition normal. Respiratory system: b/l clear, no crackles, no use of accessory muscles.  \ Cardiovascular system: S1 & S2 +, regular, No JVD. Gastrointestinal system: Abdomen soft, NT,ND, BS+. Nervous System:Alert, awake, left sided weakness  Extremities: No edema, leg bilateral are perfused, erythema on the left leg mild, surgical site on Left Leg healing.  Skin: No rashes,no icterus. MSK: Normal muscle bulk,tone,  Data Reviewed: I have personally reviewed following labs and imaging studies CBC: Recent Labs  Lab 12/31/19 1530 12/31/19 1530 01/01/20 0251 01/01/20 0251 01/01/20 2236 01/02/20 0651 01/03/20 0331 01/04/20 0153 01/05/20 0235  WBC 14.5*   < > 12.6*   < > 13.7* 13.4* 13.4* 14.7* 17.0*  NEUTROABS 10.4*  --  8.2*  --   --   --   --   --   --   HGB 10.6*   < > 10.2*   < > 8.6* 8.4* 8.3* 7.9* 8.1*  HCT 35.0*   <  > 32.2*   < > 27.6* 26.7* 26.3* 25.7* 25.9*  MCV 91.6   < > 89.4   < > 89.6 89.3 89.5 89.5 91.8  PLT 748*   < > 711*   < > 587* 575* 483* 479* 531*   < > = values in this interval not displayed.   Basic Metabolic Panel: Recent Labs  Lab 01/01/20 0311 01/01/20 1225 01/01/20 2236 01/02/20 0340 01/02/20 0651 01/02/20 1123 01/03/20 0331 01/03/20 0331 01/03/20 1050 01/03/20 1451 01/03/20 1845 01/04/20 0153 01/05/20 0235  NA  --   --  143  --  141  --  144  --   --   --   --  140 141  K  --    < > 4.9   < > 4.5   < > 4.0   < > 4.1 4.4 4.0 3.6 4.2  CL  --   --  110  --  108  --  104  --   --   --   --  103 106  CO2  --   --  18*  --  21*  --  26  --   --   --   --  26 26  GLUCOSE  --   --  110*  --  125*  --  122*  --   --   --   --  72 103*  BUN  --   --  48*  --  46*  --  22*  --   --   --   --  27* 31*  CREATININE  --   --  5.77*  --  5.54*  --  3.64*  --   --   --   --  4.06* 4.72*  CALCIUM  --   --  8.0*  --  8.2*  --  8.2*  --   --   --   --  8.4* 8.6*  PHOS 7.1*  --   --   --   --   --   --   --   --   --   --  3.3 3.6   < > = values in this interval not displayed.   GFR: Estimated Creatinine Clearance: 15.3 mL/min (A) (by C-G formula based on SCr of 4.72 mg/dL (H)). Liver Function Tests: Recent Labs  Lab 01/01/20 0251 01/04/20 0153 01/05/20 0235  AST 11*  --   --   ALT  13  --   --   ALKPHOS 108  --   --   BILITOT 0.5  --   --   PROT 6.4*  --   --   ALBUMIN 2.4* 1.7* 1.8*   No results for input(s): LIPASE, AMYLASE in the last 168 hours. No results for input(s): AMMONIA in the last 168 hours. Coagulation Profile: Recent Labs  Lab 12/31/19 1530 01/05/20 0235  INR 1.0 1.0   Cardiac Enzymes: No results for input(s): CKTOTAL, CKMB, CKMBINDEX, TROPONINI in the last 168 hours. BNP (last 3 results) No results for input(s): PROBNP in the last 8760 hours. HbA1C: No results for input(s): HGBA1C in the last 72 hours. CBG: Recent Labs  Lab 01/04/20 2045  01/05/20 0015 01/05/20 0432 01/05/20 0701 01/05/20 0905  GLUCAP 112* 102* 97 97 98   Lipid Profile: No results for input(s): CHOL, HDL, LDLCALC, TRIG, CHOLHDL, LDLDIRECT in the last 72 hours. Thyroid Function Tests: No results for input(s): TSH, T4TOTAL, FREET4, T3FREE, THYROIDAB in the last 72 hours. Anemia Panel: No results for input(s): VITAMINB12, FOLATE, FERRITIN, TIBC, IRON, RETICCTPCT in the last 72 hours. Sepsis Labs: No results for input(s): PROCALCITON, LATICACIDVEN in the last 168 hours.  Recent Results (from the past 240 hour(s))  Respiratory Panel by RT PCR (Flu A&B, Covid) - Nasopharyngeal Swab     Status: None   Collection Time: 12/31/19  5:23 PM   Specimen: Nasopharyngeal Swab  Result Value Ref Range Status   SARS Coronavirus 2 by RT PCR NEGATIVE NEGATIVE Final    Comment: (NOTE) SARS-CoV-2 target nucleic acids are NOT DETECTED. The SARS-CoV-2 RNA is generally detectable in upper respiratoy specimens during the acute phase of infection. The lowest concentration of SARS-CoV-2 viral copies this assay can detect is 131 copies/mL. A negative result does not preclude SARS-Cov-2 infection and should not be used as the sole basis for treatment or other patient management decisions. A negative result may occur with  improper specimen collection/handling, submission of specimen other than nasopharyngeal swab, presence of viral mutation(s) within the areas targeted by this assay, and inadequate number of viral copies (<131 copies/mL). A negative result must be combined with clinical observations, patient history, and epidemiological information. The expected result is Negative. Fact Sheet for Patients:  PinkCheek.be Fact Sheet for Healthcare Providers:  GravelBags.it This test is not yet ap proved or cleared by the [place] FDA and  has been authorized for detection and/or diagnosis of SARS-CoV-2 by FDA  under an Emergency Use Authorization (EUA). This EUA will remain  in effect (meaning this test can be used) for the duration of the COVID-19 declaration under Section 564(b)(1) of the Act, 21 U.S.C. section 360bbb-3(b)(1), unless the authorization is terminated or revoked sooner.    Influenza A by PCR NEGATIVE NEGATIVE Final   Influenza B by PCR NEGATIVE NEGATIVE Final    Comment: (NOTE) The Xpert Xpress SARS-CoV-2/FLU/RSV assay is intended as an aid in  the diagnosis of influenza from Nasopharyngeal swab specimens and  should not be used as a sole basis for treatment. Nasal washings and  aspirates are unacceptable for Xpert Xpress SARS-CoV-2/FLU/RSV  testing. Fact Sheet for Patients: PinkCheek.be Fact Sheet for Healthcare Providers: GravelBags.it This test is not yet approved or cleared by the [place] FDA and  has been authorized for detection and/or diagnosis of SARS-CoV-2 by  FDA under an Emergency Use Authorization (EUA). This EUA will remain  in effect (meaning this test can be used) for the duration of the  Covid-19 declaration under Section 564(b)(1) of the Act, 21  U.S.C. section 360bbb-3(b)(1), unless the authorization is  terminated or revoked. Performed at [facility], [place] [address], [place], [place] [address]   Surgical pcr screen     Status: Abnormal   Collection Time: 01/01/20  4:27 AM   Specimen: Nasal Mucosa; Nasal Swab  Result Value Ref Range Status   MRSA, PCR NEGATIVE NEGATIVE Final   Staphylococcus aureus POSITIVE (A) NEGATIVE Final    Comment: (NOTE) The Xpert SA Assay (FDA approved for NASAL specimens in patients 41 years of age and older), is one component of a comprehensive surveillance program. It is not intended to diagnose infection nor to guide or monitor treatment. Performed at [facility], [place] [address] [place],  [address]       Radiology Studies: No  results found.   LOS: 5 days   Time spent: More than 50% of that time was spent in counseling and/or coordination of care.  Antonieta Pert, MD [facility]  01/05/2020, 12:59 PM

## 2020-01-05 NOTE — Anesthesia Procedure Notes (Signed)
Procedure Name: MAC Date/Time: 01/05/2020 7:30 AM Performed by: Alain Marion, CRNA Pre-anesthesia Checklist: Patient identified, Emergency Drugs available, Suction available and Patient being monitored Patient Re-evaluated:Patient Re-evaluated prior to induction Oxygen Delivery Method: Simple face mask Placement Confirmation: positive ETCO2

## 2020-01-05 NOTE — TOC Progression Note (Signed)
Transition of Care [facility]) - Progression Note    Patient Details  Name: Phillip Camacho MRN: [id number] Date of Birth: 1971/04/30  Transition of Care [facility]) CM/SW [place], [place] Phone Number: 01/05/2020, 1:29 PM  Clinical Narrative:     CSW sent referral to [facility] [place] waiting on response.  Thurmond Butts, MSW, [place] Clinical Social Worker    Expected Discharge Plan: Skilled Nursing Facility Barriers to Discharge: Continued Medical Work up  Expected Discharge Plan and Services Expected Discharge Plan: [place] arrangements for the past 2 months: Single Family Home                                       Social Determinants of Health (SDOH) Interventions    Readmission Risk Interventions No flowsheet data found.

## 2020-01-05 NOTE — Progress Notes (Signed)
PT Cancellation Note  Patient Details Name: Phillip Camacho MRN: [id number] DOB: Nov 19, 1970   Cancelled Treatment:    Reason Eval/Treat Not Completed: Patient at procedure or test/unavailable Pt in HD. PT will continue to follow acutely.     Earney Navy, PTA Acute Rehabilitation Services Pager: [phone] Office: [phone]   01/05/2020, 2:44 PM

## 2020-01-05 NOTE — Interval H&P Note (Signed)
History and Physical Interval Note:  01/05/2020 7:13 AM  Phillip Camacho  has presented today for surgery, with the diagnosis of ESRD.  The various methods of treatment have been discussed with the patient and family. After consideration of risks, benefits and other options for treatment, the patient has consented to  Procedure(s): RIGHT ARTERIOVENOUS (AV) FISTULA CREATION (Right) as a surgical intervention.  The patient's history has been reviewed, patient examined, no change in status, stable for surgery.  I have reviewed the patient's chart and labs.  Questions were answered to the patient's satisfaction.     Curt Jews

## 2020-01-05 NOTE — Progress Notes (Signed)
Renal Navigator received notification from Nephrologist/Dr. Hollie Salk that patient needs OP HD clinic and notes that patient has been following with Dr. Durwin Reges. SNF has been recommended, so Navigator has spoken with CSW/[name], who states patient does not have any bed offers at this time and that she will keep Navigator posted regarding possible placements.  Renal Navigator will follow closely.  Phillip Camacho, [place] Renal Navigator [phone]

## 2020-01-05 NOTE — TOC Progression Note (Signed)
Transition of Care [facility]) - Progression Note    Patient Details  Name: Phillip Camacho MRN: [id number] Date of Birth: 01-08-1971  Transition of Care [facility]) CM/SW [place], [place] Phone Number: 01/05/2020, 12:32 PM  Clinical Narrative:     Patient has no bed offers.  CSW will continue to follow and assist with discharge planning.  Thurmond Butts, MSW, [place] Clinical Social Worker    Expected Discharge Plan: Skilled Nursing Facility Barriers to Discharge: Continued Medical Work up  Expected Discharge Plan and Services Expected Discharge Plan: [place] arrangements for the past 2 months: Single Family Home                                       Social Determinants of Health (SDOH) Interventions    Readmission Risk Interventions No flowsheet data found.

## 2020-01-05 NOTE — Progress Notes (Signed)
Patient ID: Phillip Camacho, male   DOB: 02-22-1971, 49 y.o.   MRN: [id number]  Progress Note    01/05/2020 11:02 AM Day of Surgery  Subjective: Patient underwent left upper arm brachiocephalic fistula this morning.   Vitals:   01/05/20 0930 01/05/20 1007  BP: 130/64 (!) 136/59  Pulse:  97  Resp: 11 15  Temp: 98.3 F (36.8 C) 98.5 F (36.9 C)  SpO2: 100% 97%   Physical Exam: Left leg vein harvest incisions healing no hematoma.  Foot hyperemic from reperfusion.  Biphasic dorsalis pedis, posterior tibial and peroneal signal.  Demarcating distal foot.  Third toe necrotic with some bullous changes.  CBC    Component Value Date/Time   WBC 17.0 (H) 01/05/2020 0235   RBC 2.82 (L) 01/05/2020 0235   HGB 8.1 (L) 01/05/2020 0235   HGB 11.3 (L) 11/12/2019 1616   HCT 25.9 (L) 01/05/2020 0235   HCT 34.1 (L) 11/12/2019 1616   PLT 531 (H) 01/05/2020 0235   PLT 527 (H) 11/12/2019 1616   MCV 91.8 01/05/2020 0235   MCV 84 11/12/2019 1616   MCH 28.7 01/05/2020 0235   MCHC 31.3 01/05/2020 0235   RDW 15.0 01/05/2020 0235   RDW 14.5 11/12/2019 1616   LYMPHSABS 2.7 01/01/2020 0251   MONOABS 1.0 01/01/2020 0251   EOSABS 0.6 (H) 01/01/2020 0251   BASOSABS 0.1 01/01/2020 0251    BMET    Component Value Date/Time   NA 141 01/05/2020 0235   NA 139 11/12/2019 1616   K 4.2 01/05/2020 0235   CL 106 01/05/2020 0235   CO2 26 01/05/2020 0235   GLUCOSE 103 (H) 01/05/2020 0235   BUN 31 (H) 01/05/2020 0235   BUN 34 (H) 11/12/2019 1616   CREATININE 4.72 (H) 01/05/2020 0235   CALCIUM 8.6 (L) 01/05/2020 0235   GFRNONAA 14 (L) 01/05/2020 0235   GFRAA 16 (L) 01/05/2020 0235    INR    Component Value Date/Time   INR 1.0 01/05/2020 0235     Intake/Output Summary (Last 24 hours) at 01/05/2020 1102 Last data filed at 01/05/2020 0905 Gross per 24 hour  Intake 400 ml  Output 10 ml  Net 390 ml     Assessment/Plan:  49 y.o. male multiple medical issues.  Renal failure.  Left foot demarcating  from extended ischemia prior to presentation.  Bypass open with well-perfused foot     [name], MD FACS [facility] [phone] 01/05/2020 11:02 AM

## 2020-01-05 NOTE — Op Note (Signed)
    OPERATIVE REPORT  DATE OF SURGERY: 01/05/2020  PATIENT: Phillip Camacho, 49 y.o. male MRN: [id number]  DOB: 1971/04/05  PRE-OPERATIVE DIAGNOSIS: End-stage renal disease  POST-OPERATIVE DIAGNOSIS:  Same  PROCEDURE: Left brachiocephalic AV fistula creation  SURGEON:  Curt Jews, M.D.  PHYSICIAN ASSISTANT: Risa Grill PA-C  ANESTHESIA: Local with sedation  EBL: per anesthesia record  Total I/O In: 400 [I.V.:400] Out: -   BLOOD ADMINISTERED: none  DRAINS: none  SPECIMEN: none  COUNTS CORRECT:  YES  PATIENT DISPOSITION:  PACU - hemodynamically stable  PROCEDURE DETAILS: Patient was taken to the operating room placed supine position where the area of the left arm was prepped draped you sterile fashion.  SonoSite ultrasound was used to visualize the cephalic vein and the basilic vein.  Both of these appear to be of good caliber joint just below the antecubital space.  Patient had a normal brachial pulse.  Incision was made using local anesthesia transversely at the antecubital space and carried down to isolate the cephalic vein which was of good caliber.  This was dissected down to the junction with the basilic vein.  The cephalic vein was ligated and the basilic vein was intentionally left intact with flow for future fistula if needed.  The brachial artery was exposed through the same incision.  The artery was of good caliber.  Did have moderate atherosclerotic change.  The brachial artery was occluded proximally distally was opened with 11 blade sent longitudinally with Potts scissors.  The vein was cut and spatulated and sewn end-to-side to the artery with a running 6-0 Prolene suture.  Clamps removed and excellent thrill was noted.  Wounds irrigated with saline.  Hemostasis electrocautery.  Wounds were closed with 3-0 Vicryl in the subcutaneous and subcuticular tissue.  Sterile dressing was applied the patient was transferred to the recovery room in stable condition   [name], M.D., [facility] 01/05/2020 8:55 AM

## 2020-01-05 NOTE — Anesthesia Preprocedure Evaluation (Signed)
Anesthesia Evaluation  Patient identified by MRN, date of birth, ID band Patient awake    Reviewed: Allergy & Precautions, H&P , NPO status , Patient's Chart, lab work & pertinent test results, reviewed documented beta blocker date and time   Airway Mallampati: II  TM Distance: >3 FB Neck ROM: full    Dental no notable dental hx.    Pulmonary neg pulmonary ROS, Current Smoker,    Pulmonary exam normal breath sounds clear to auscultation       Cardiovascular Exercise Tolerance: Good hypertension, + Peripheral Vascular Disease   Rhythm:regular Rate:Normal  ECHO 1/21 FINDINGS  Left Ventricle: Left ventricular ejection fraction, by visual estimation,  is 50%. The left ventricle has low normal function. The left ventricle has  no regional wall motion abnormalities. There is moderately increased left  ventricular hypertrophy.  Concentric left ventricular hypertrophy. Left ventricular diastolic  parameters are consistent with Grade I diastolic dysfunction (impaired  relaxation). Elevated left ventricular end-diastolic pressure.    Neuro/Psych CVA negative psych ROS   GI/Hepatic negative GI ROS, Neg liver ROS,   Endo/Other  negative endocrine ROSdiabetes, Type 2  Renal/GU ESRFRenal disease  negative genitourinary   Musculoskeletal   Abdominal   Peds  Hematology negative hematology ROS (+)   Anesthesia Other Findings   Reproductive/Obstetrics negative OB ROS                             Anesthesia Physical Anesthesia Plan  ASA: III  Anesthesia Plan: MAC   Post-op Pain Management:    Induction: Intravenous  PONV Risk Score and Plan:   Airway Management Planned: Mask and Nasal Cannula  Additional Equipment:   Intra-op Plan:   Post-operative Plan:   Informed Consent: I have reviewed the patients History and Physical, chart, labs and discussed the procedure including the risks,  benefits and alternatives for the proposed anesthesia with the patient or authorized representative who has indicated his/her understanding and acceptance.     Dental Advisory Given  Plan Discussed with: CRNA and Anesthesiologist  Anesthesia Plan Comments:         Anesthesia Quick Evaluation

## 2020-01-06 ENCOUNTER — Inpatient Hospital Stay (HOSPITAL_COMMUNITY): Payer: 59

## 2020-01-06 DIAGNOSIS — G7000 Myasthenia gravis without (acute) exacerbation: Secondary | ICD-10-CM

## 2020-01-06 LAB — RENAL FUNCTION PANEL
Albumin: 1.7 g/dL — ABNORMAL LOW (ref 3.5–5.0)
Anion gap: 19 — ABNORMAL HIGH (ref 5–15)
BUN: 19 mg/dL (ref 6–20)
CO2: 22 mmol/L (ref 22–32)
Calcium: 8.1 mg/dL — ABNORMAL LOW (ref 8.9–10.3)
Chloride: 99 mmol/L (ref 98–111)
Creatinine, Ser: 3.77 mg/dL — ABNORMAL HIGH (ref 0.61–1.24)
GFR calc Af Amer: 21 mL/min — ABNORMAL LOW (ref >60)
GFR calc non Af Amer: 18 mL/min — ABNORMAL LOW (ref >60)
Glucose, Bld: 88 mg/dL (ref 70–99)
Phosphorus: 4.1 mg/dL (ref 2.5–4.6)
Potassium: 4.3 mmol/L (ref 3.5–5.1)
Sodium: 140 mmol/L (ref 135–145)

## 2020-01-06 LAB — GLUCOSE, CAPILLARY
Glucose-Capillary: 87 mg/dL (ref 70–99)
Glucose-Capillary: 80 mg/dL (ref 70–99)
Glucose-Capillary: 97 mg/dL (ref 70–99)
Glucose-Capillary: 117 mg/dL — ABNORMAL HIGH (ref 70–99)
Glucose-Capillary: 142 mg/dL — ABNORMAL HIGH (ref 70–99)
Glucose-Capillary: 132 mg/dL — ABNORMAL HIGH (ref 70–99)

## 2020-01-06 LAB — CBC
HCT: 29.1 % — ABNORMAL LOW (ref 39.0–52.0)
Hemoglobin: 8.7 g/dL — ABNORMAL LOW (ref 13.0–17.0)
MCH: 28 pg (ref 26.0–34.0)
MCHC: 29.9 g/dL — ABNORMAL LOW (ref 30.0–36.0)
MCV: 93.6 fL (ref 80.0–100.0)
Platelets: 610 10*3/uL — ABNORMAL HIGH (ref 150–400)
RBC: 3.11 MIL/uL — ABNORMAL LOW (ref 4.22–5.81)
RDW: 15 % (ref 11.5–15.5)
WBC: 19.1 10*3/uL — ABNORMAL HIGH (ref 4.0–10.5)
nRBC: 0 % (ref 0.0–0.2)

## 2020-01-06 LAB — PROTHROMBIN GENE MUTATION

## 2020-01-06 MED ORDER — DOXYCYCLINE HYCLATE 100 MG PO TABS
100.0000 mg | ORAL_TABLET | Freq: Two times a day (BID) | ORAL | Status: DC
  Administered 2020-01-06 (×2): 100 mg via ORAL
  Filled 2020-01-06 (×2): qty 1

## 2020-01-06 NOTE — Progress Notes (Addendum)
[facility] Progress Note   Assessment/ Plan:   Phillip Camacho is a 49 y.o. yo male with DM. HTN, history of CVA's and progressive CKD-  Followed by Dr. Durwin Reges mose recently who was admitted on 12/31/2019 with and ischemic foot   Assessment/Plan: 1. Renal-  Progressive CKD, failure to thrive and many episodes of hyperkalemia so decision has been made to go ahead and initiate chronic dialysis for ESRD.  S/p [facility] and first HD treatment 3/12  Second treatment Monday 3/15.  Next HD 3/17, s/p AVF 3/15, appreciate VVS.  Searching for OP HD unit- lives in [place] but seem to be looking at SNF as Phillip Camacho cannot be left alone and son works.  Renal navigator on the case.  2. Hyperkalemia-  Improved with medical treatment -  stopped lokelma since starting HD 3. Anemia- iron stores low-  Need to repleting with ferrlecit  and also started ESA 4. Secondary hyperparathyroidism- phos high, start binder PTH pending still 5. HTN/volume-  BP high- norvasc 10 and prn hydralazine, will change to scheduled- BP much better 6. Metabolic acidosis-  resolved 7. Ischemic foot- s/p fempop bypass per vasc.   8.  Multiple CVA's-  On secondary prevention.  Neuro following, possible event this AM 3/16. 9.  New fever: today 3/16, WBC up to 19.1.  Blood cultures and US dopplers ordered.   10.  Dispo-  Needs SNF due to confusion and eyesight.  It appears that prognosis is poor.    Subjective:    S/p possible neuro event this AM- looks like couldn't obtain MRI d/t Phillip Camacho movement.  Currently sleeping.   Objective:   BP 120/65 (BP Location: Right Arm)   Pulse (!) 109   Temp 99.9 F (37.7 C) (Oral)   Resp 19   Ht 5\' 5"  (1.651 m)   Wt 56.5 kg   SpO2 100%   BMI 20.73 kg/m   Physical Exam: Gen: sleeping, able to be aroused, difficult to communicate CVS: RRR Resp: clear bilaterally  Abd: soft Ext: no LE edema ACCESS: R IJ TDC, L AVF + T/B  Labs: BMET Recent Labs  Lab 01/01/20 0251 01/01/20 0311 01/01/20 1225  01/01/20 2236 01/02/20 0340 01/02/20 0651 01/02/20 1123 01/03/20 0331 01/03/20 1050 01/03/20 1451 01/03/20 1845 01/04/20 0153 01/05/20 0235 01/06/20 0133  NA 143  --   --  143  --  141  --  144  --   --   --  140 141 140  K 5.6*  --    < > 4.9   < > 4.5   < > 4.0 4.1 4.4 4.0 3.6 4.2 4.3  CL 111  --   --  110  --  108  --  104  --   --   --  103 106 99  CO2 21*  --   --  18*  --  21*  --  26  --   --   --  26 26 22   GLUCOSE 120*  --   --  110*  --  125*  --  122*  --   --   --  72 103* 88  BUN 50*  --   --  48*  --  46*  --  22*  --   --   --  27* 31* 19  CREATININE 5.89*  --   --  5.77*  --  5.54*  --  3.64*  --   --   --  4.06* 4.72*  3.77*  CALCIUM 8.9  --   --  8.0*  --  8.2*  --  8.2*  --   --   --  8.4* 8.6* 8.1*  PHOS  --  7.1*  --   --   --   --   --   --   --   --   --  3.3 3.6 4.1   < > = values in this interval not displayed.   CBC Recent Labs  Lab 12/31/19 1530 12/31/19 1530 01/01/20 0251 01/01/20 2236 01/03/20 0331 01/04/20 0153 01/05/20 0235 01/06/20 0831  WBC 14.5*   < > 12.6*   < > 13.4* 14.7* 17.0* 19.1*  NEUTROABS 10.4*  --  8.2*  --   --   --   --   --   HGB 10.6*   < > 10.2*   < > 8.3* 7.9* 8.1* 8.7*  HCT 35.0*   < > 32.2*   < > 26.3* 25.7* 25.9* 29.1*  MCV 91.6   < > 89.4   < > 89.5 89.5 91.8 93.6  PLT 748*   < > 711*   < > 483* 479* 531* 610*   < > = values in this interval not displayed.      Medications:    . amLODipine  10 mg Oral Daily  . aspirin EC  81 mg Oral Daily  . atorvastatin  80 mg Oral q1800  . calcium acetate  667 mg Oral TID WC  . Chlorhexidine Gluconate Cloth  6 each Topical Q0600  . darbepoetin (ARANESP) injection - DIALYSIS  100 mcg Intravenous Q Mon-HD  . doxycycline  100 mg Oral Q12H  . heparin  5,000 Units Subcutaneous Q8H  . hydrALAZINE  25 mg Oral Q8H  . insulin aspart  0-9 Units Subcutaneous Q4H  . mupirocin ointment  1 application Nasal BID  . sodium chloride flush  3 mL Intravenous Q12H  . ticagrelor  90 mg Oral  BID     Madelon Lips, MD 01/06/2020, 11:15 AM

## 2020-01-06 NOTE — Progress Notes (Signed)
  Progress Note    01/06/2020 9:46 AM 1 Day Post-Op  Subjective:  AMS, possible neuro event this morning.  Patient continues to be disoriented.   Vitals:   01/06/20 0806 01/06/20 0919  BP: 131/62 120/65  Pulse: (!) 109 (!) 109  Resp: 17 19  Temp: (!) 101.5 F (38.6 C) 99.9 F (37.7 C)  SpO2: 100%    Physical Exam: Lungs:  Non labored Incisions:  L groin and incisions of LLE c/d/i; L foot warm with DP and peroneal brisk by doppler; soft PT by doppler; hyperemic L foot with gangrenous changes to 4th and 5th and blistering of dorsal foot Extremities:  L arm brachiocephalic fistula with palpable thrill; palpable radial pulse Neurologic: AMS  CBC    Component Value Date/Time   WBC 17.0 (H) 01/05/2020 0235   RBC 2.82 (L) 01/05/2020 0235   HGB 8.1 (L) 01/05/2020 0235   HGB 11.3 (L) 11/12/2019 1616   HCT 25.9 (L) 01/05/2020 0235   HCT 34.1 (L) 11/12/2019 1616   PLT 531 (H) 01/05/2020 0235   PLT 527 (H) 11/12/2019 1616   MCV 91.8 01/05/2020 0235   MCV 84 11/12/2019 1616   MCH 28.7 01/05/2020 0235   MCHC 31.3 01/05/2020 0235   RDW 15.0 01/05/2020 0235   RDW 14.5 11/12/2019 1616   LYMPHSABS 2.7 01/01/2020 0251   MONOABS 1.0 01/01/2020 0251   EOSABS 0.6 (H) 01/01/2020 0251   BASOSABS 0.1 01/01/2020 0251    BMET    Component Value Date/Time   NA 140 01/06/2020 0133   NA 139 11/12/2019 1616   K 4.3 01/06/2020 0133   CL 99 01/06/2020 0133   CO2 22 01/06/2020 0133   GLUCOSE 88 01/06/2020 0133   BUN 19 01/06/2020 0133   BUN 34 (H) 11/12/2019 1616   CREATININE 3.77 (H) 01/06/2020 0133   CALCIUM 8.1 (L) 01/06/2020 0133   GFRNONAA 18 (L) 01/06/2020 0133   GFRAA 21 (L) 01/06/2020 0133    INR    Component Value Date/Time   INR 1.0 01/05/2020 0235     Intake/Output Summary (Last 24 hours) at 01/06/2020 0946 Last data filed at 01/06/2020 0934 Gross per 24 hour  Intake 346 ml  Output 0 ml  Net 346 ml     Assessment/Plan:  49 y.o. male is s/p L brachiocephalic  fistula creation 1 Day Post-Op  L femoral to popliteal bypass with vein 5 days post op  AMS, possible neuro event this morning; patient not at baseline mentation  LLE well perfused with all 3 tibial signals by doppler L foot appears hyperemic; toes and dorsal foot demarcating; continue to monitor L brachiocephalic fistula patent with palpable thrill Vascular will continue to follow   Dagoberto Ligas, PA-C [facility] [phone] 01/06/2020 9:46 AM

## 2020-01-06 NOTE — Progress Notes (Signed)
Physical Therapy Treatment Patient Details Name: Phillip Camacho MRN: [id number] DOB: 08-25-1971 Today's Date: 01/06/2020    History of Present Illness Patient is a 49 y/o male who presents with left foot pain and ischemia. S/p Left femoral to below-knee popliteal bypass with reverse great saphenous vein and Right internal jugular vein 3/11. s/p tunneled dialysis catheter placement 3/12 due to worsening kidney disease. PMh includes DM, HTN, CKD stage IV and recurrent CVAs    PT Comments    Patient seen for mobility progression. Pt follows single step commands inconsistently and presents with impaired mobility and cognition increasing risk for falls. Pt requires +2 assist for functional transfer training this session. Continue to progress as tolerated with anticipated d/c to SNF for further skilled PT services.      Follow Up Recommendations  SNF;Supervision for mobility/OOB     Equipment Recommendations  Other (comment)(defer to SNF)    Recommendations for Other Services       Precautions / Restrictions Precautions Precautions: Fall Precaution Comments: watch HR Restrictions Weight Bearing Restrictions: No    Mobility  Bed Mobility Overal bed mobility: Needs Assistance Bed Mobility: Supine to Sit;Sit to Supine     Supine to sit: Max assist Sit to supine: Max assist   General bed mobility comments: poor initiation of task with multimodal cues and limited physical effort requiring max A for bed mob  Transfers Overall transfer level: Needs assistance Equipment used: Rolling walker (2 wheeled) Transfers: Sit to/from Stand;Lateral/Scoot Transfers Sit to Stand: Max assist;+2 physical assistance;+2 safety/equipment        Lateral/Scoot Transfers: Max assist;+2 physical assistance General transfer comment: pt stood briefly X 2 trials from EOB with bilat LE blocked and use of gait belt and bed pad for hip extension; pt unable to take steps so a few small lateral scoots toward  [facility] for positioning   Ambulation/Gait                 Stairs             Wheelchair Mobility    Modified Rankin (Stroke Patients Only)       Balance Overall balance assessment: Needs assistance Sitting-balance support: Feet supported;Bilateral upper extremity supported Sitting balance-Leahy Scale: Poor Sitting balance - Comments: patient making multiple attempts to lay himself backwards/towards pillow Postural control: Posterior lean Standing balance support: Bilateral upper extremity supported;During functional activity Standing balance-Leahy Scale: Zero Standing balance comment: reliant on B UE support and maximal external assist                            Cognition Arousal/Alertness: Awake/alert Behavior During Therapy: Flat affect Overall Cognitive Status: No family/caregiver present to determine baseline cognitive functioning Area of Impairment: Orientation;Attention;Following commands;Safety/judgement;Awareness;Problem solving                 Orientation Level: Disoriented to;Place;Time;Situation Current Attention Level: Focused   Following Commands: Follows one step commands with increased time;Follows one step commands inconsistently Safety/Judgement: Decreased awareness of safety;Decreased awareness of deficits Awareness: Intellectual Problem Solving: Slow processing;Decreased initiation;Difficulty sequencing;Requires verbal cues;Requires tactile cues General Comments: difficult to understand patient, has tendency to mumble, did clearly state son's name "jimmy" when asked      Exercises      General Comments        Pertinent Vitals/Pain Pain Assessment: Faces Faces Pain Scale: Hurts little more Pain Location: unspecified Pain Descriptors / Indicators: Grimacing Pain Intervention(s): Monitored during session  Home Living                      Prior Function            PT Goals (current goals can now be  found in the care plan section) Acute Rehab PT Goals Patient Stated Goal: none stated Progress towards PT goals: Progressing toward goals    Frequency    Min 3X/week      PT Plan Current plan remains appropriate    Co-evaluation PT/OT/SLP Co-Evaluation/Treatment: Yes Reason for Co-Treatment: Complexity of the patient's impairments (multi-system involvement);For patient/therapist safety;To address functional/ADL transfers;Necessary to address cognition/behavior during functional activity PT goals addressed during session: Mobility/safety with mobility;Balance OT goals addressed during session: ADL's and self-care      AM-PAC PT "6 Clicks" Mobility   Outcome Measure  Help needed turning from your back to your side while in a flat bed without using bedrails?: A Lot Help needed moving from lying on your back to sitting on the side of a flat bed without using bedrails?: A Lot Help needed moving to and from a bed to a chair (including a wheelchair)?: A Lot Help needed standing up from a chair using your arms (e.g., wheelchair or bedside chair)?: A Lot Help needed to walk in hospital room?: Total Help needed climbing 3-5 steps with a railing? : Total 6 Click Score: 10    End of Session Equipment Utilized During Treatment: Gait belt Activity Tolerance: Patient tolerated treatment well Patient left: in bed;with call bell/phone within reach;with bed alarm set Nurse Communication: Mobility status PT Visit Diagnosis: Pain;Muscle weakness (generalized) (M62.81);Unsteadiness on feet (R26.81);Difficulty in walking, not elsewhere classified (R26.2) Pain - Right/Left: Left Pain - part of body: Leg     Time: 9166-0600 PT Time Calculation (min) (ACUTE ONLY): 31 min  Charges:  $Gait Training: 8-22 mins                     Earney Navy, PTA Acute Rehabilitation Services Pager: [phone] Office: [phone]     Darliss Cheney 01/06/2020, 1:37 PM

## 2020-01-06 NOTE — Progress Notes (Signed)
PROGRESS NOTE    Phillip Camacho  WLN:[id number] DOB: [birth year] DOA: 12/31/2019 PCP: Phillip Blackbird, MD   Brief Narrative: As per admission HPI: 49 y.o. 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  Last hospitalized in January 2021 for a stroke since then has been confused, not been eating or drinking, needing supervision with ADLs. In the ED found to have potassium 6.6, bicarbonate 17, BUN 50, and creatinine 5.69, up from 4.8 in January 2021.  CBC notable for leukocytosis to 14,500, stable normocytic anemia, and increased thrombocytosis with platelets of 748,000.  INR is normal.  Covid and influenza PCR are negative.    Patient received temporizing measures with insulin/dextrose, bicarbonate, and Lokelma, nephrology/vascular surgery consulted, patient was admitted for critical left leg ischemia and hyperkalemia and renal failure. 3/11-left femoral to below-knee popliteal bypass.  Patient seen by nephrology and also started on dialysis after placing TDC.  Subjective:  Low-grade temp T-max 100.4, vitals are stable. History stable potassium and bicarb, BUN 19 creatinine 3.7 Patient denies any complaint.  Spoke with patient's son on the phone while rounding on the patient-who helped with interaction patient had no complaint.  And he felt okay. c/o of soreness on the leg.  Assessment & Plan:  Progressive CKD stage 4 due to poorly controlled diabetes and hypertension: nephro on board, work-up negative so far with negative HIV, hep C, hep B.  Holding home losartan. status post tunnel dialysis catheter placement and initiation of dialysis 3/12, AV fistula placed 3/15 ongoing dialysis again.  He will need long-term dialysis.  Patient will need clip and skilled nursing facility placement.  Discussed with Nephrology and is stable for discharge  once SNF placement and clip completed Recent Labs  Lab 01/02/20 0651 01/03/20 0331 01/04/20 0153 01/05/20 0235 01/06/20 0133  BUN 46* 22* 27* 31* 19  CREATININE 5.54* 3.64* 4.06* 4.72* 3.77*   Critical ischemia of left lower extremity:  S/p Left femoral to below-knee popliteal bypass with reverse great saphenous vein 3/11.  Appreciate vascular input, monitor distal pulses.  Continue aspirin, Brilinta and Lipitor.  Pain control.  Left leg is mildly swollen and red, watch for any kind of infection.  Low grade temp- monitor.IS,. ambulation.  CBC with worsening leukocyte count, He is s/p avf, had HD line. watch for infections.  Check blood cultures.  Check duplex of the lower leg on left, ?cellulitis- add doxy.  Pupillary asymmetry was noted this am 3/16 after bath - code stroke activated- seen by neuro-CT head was negative an MRI has been ordered.   Severe Hyperkalemia: resolved Metabolic acidosis from renal dysfunction-resolved Nephrotic syndrome presented to diabetic nephropathy.  Monitor.  Insulin-requiring or dependent type II diabetes mellitus, hemoglobin A1c is poorly controlled 8.6 on 1/23.  Blood sugar stable, we will continue sliding scale insulin.   Recent Labs  Lab 01/05/20 1623 01/05/20 2007 01/06/20 0031 01/06/20 0412 01/06/20 0818  GLUCAP 95 77 87 80 97   History recurrent CVAs:Patient was admitted in January with recurrent CVA, likely embolic, has had increased weakness on the left and cognitive deficits since then. Cont home Brilinta, aspirin and Lipitor.    Hypertension,controlled, continue home Norvasc,  As needed hydralazine.  Continue to dialyse.  HLD:on statins.  Tobacco DEY:CXKG need to advise cessation.  Anemia of CKD-hemoglobin is stable at 8.3 g.Monitor transfuse for less than 7  g.    Social issues:Patient lives with his son who works during the day and has had difficulty caring for the patient who has been frequently confused since the recent strokes  and is often unable to perform basic ADLs on his own.Likely need to look into skilled nursing facility.  TOC consulted  Body mass index is 20.73 kg/m.  DVT prophylaxis:Heparin. Code Status:full.  Family Communication:I have called son and updated-he agrees for skilled nursing facility.  Disposition Plan: Patient is from:home Anticipated Disposition:PT recommends SNF.  Son is at work during daytime unable to look after him and he agrees for a skilled nursing facility. Barriers to discharge or conditions that needs to be met prior to discharge: Patient was admitted with progressive CKD and critical ischemia of left leg status post bypass surgery, and with worsening renal failure now on dialysis.  Code stroke activated for pupillary asymmetry 3/16 and also episode of fever work-up in progress. . Plan for SNF placement and clip   Consultants: Nephrology, vascular surgery.   Procedures:  3/11: Left femoral to below-knee popliteal bypass with reverse great saphenous vein  Microbiology:see note  Medications: Scheduled Meds: . amLODipine  10 mg Oral Daily  . aspirin EC  81 mg Oral Daily  . atorvastatin  80 mg Oral q1800  . calcium acetate  667 mg Oral TID WC  . Chlorhexidine Gluconate Cloth  6 each Topical Q0600  . darbepoetin (ARANESP) injection - DIALYSIS  100 mcg Intravenous Q Mon-HD  . heparin  5,000 Units Subcutaneous Q8H  . hydrALAZINE  25 mg Oral Q8H  . insulin aspart  0-9 Units Subcutaneous Q4H  . mupirocin ointment  1 application Nasal BID  . sodium chloride flush  3 mL Intravenous Q12H  . ticagrelor  90 mg Oral BID   Continuous Infusions: . sodium chloride 10 mL/hr at 01/02/20 0916    Antimicrobials: Anti-infectives (From admission, onward)   Start     Dose/Rate Route Frequency Ordered Stop   01/05/20 0000  ceFAZolin (ANCEF) IVPB 1 g/50 mL premix    Note to Pharmacy: Send with pt to OR   1 g 100 mL/hr over 30 Minutes Intravenous On call 01/04/20 1134 01/05/20 0735    01/02/20 0927  ceFAZolin (ANCEF) 2-4 GM/100ML-% IVPB    Note to Pharmacy: [name]   : cabinet override      01/02/20 0927 01/02/20 0945   01/01/20 1600  ceFAZolin (ANCEF) IVPB 2g/100 mL premix     2 g 200 mL/hr over 30 Minutes Intravenous Every 8 hours 01/01/20 1224 01/02/20 0125   01/01/20 0706  ceFAZolin (ANCEF) 2-4 GM/100ML-% IVPB    Note to Pharmacy: Lelon Perla   : cabinet override      01/01/20 0706 01/01/20 1914   12/31/19 1745  ceFAZolin (ANCEF) IVPB 2g/100 mL premix     2 g 200 mL/hr over 30 Minutes Intravenous On call to O.R. 12/31/19 1730 01/01/20 0559       Objective: Vitals: Today's Vitals   01/06/20 0410 01/06/20 0700 01/06/20 0806 01/06/20 0919  BP: 120/62 138/71 131/62 120/65  Pulse: 99  (!) 109 (!) 109  Resp: _0 Temp: 100.3 F (37.9 C)  (!) 101.5 F (38.6 C) 99.9 F (37.7 C)  TempSrc: Oral  Oral Oral  SpO2: 96%  100%   Weight:      Height:      PainSc:    0-No pain    Intake/Output Summary (Last 24 hours) at  01/06/2020 1044 Last data filed at 01/06/2020 0934 Gross per 24 hour  Intake 346 ml  Output 0 ml  Net 346 ml   Filed Weights   01/02/20 1750 01/05/20 1110 01/05/20 1418  Weight: 56.6 kg 56.5 kg 56.5 kg   Weight change:    Intake/Output from previous day: 03/15 0701 - 03/16 0700 In: 381 [P.O.:100; I.V.:403; IV Piggyback:240] Out: 10 [Blood:10] Intake/Output this shift: Total I/O In: 3 [I.V.:3] Out: -   Examination:  General exam: Alert awake baseline currently per minute forgetfulness, not in acute distress, slightly more lethargic today. HEENT:Oral mucosa moist, Ear/Nose WNL grossly,dentition normal. Respiratory system: b/l clear, no crackles, no use of accessory muscles.  \ Cardiovascular system: S1 & S2 +, regular, No JVD. Gastrointestinal system: Abdomen soft, NT,ND, BS+. Nervous System:Alert, awake, left sided weakness  Extremities: Mild erythema and edema on the left leg, left leg with surgical scar intact,  healing.  No drainage.   Skin: No rashes,no icterus. MSK: Normal muscle bulk,tone,  Data Reviewed: I have personally reviewed following labs and imaging studies CBC: Recent Labs  Lab 12/31/19 1530 12/31/19 1530 01/01/20 0251 01/01/20 2236 01/02/20 0651 01/03/20 0331 01/04/20 0153 01/05/20 0235 01/06/20 0831  WBC 14.5*   < > 12.6*   < > 13.4* 13.4* 14.7* 17.0* 19.1*  NEUTROABS 10.4*  --  8.2*  --   --   --   --   --   --   HGB 10.6*   < > 10.2*   < > 8.4* 8.3* 7.9* 8.1* 8.7*  HCT 35.0*   < > 32.2*   < > 26.7* 26.3* 25.7* 25.9* 29.1*  MCV 91.6   < > 89.4   < > 89.3 89.5 89.5 91.8 93.6  PLT 748*   < > 711*   < > 575* 483* 479* 531* 610*   < > = values in this interval not displayed.   Basic Metabolic Panel: Recent Labs  Lab 01/01/20 0311 01/01/20 1225 01/02/20 0651 01/02/20 1123 01/03/20 0331 01/03/20 1050 01/03/20 1451 01/03/20 1845 01/04/20 0153 01/05/20 0235 01/06/20 0133  NA  --    < > 141  --  144  --   --   --  140 141 140  K  --    < > 4.5   < > 4.0   < > 4.4 4.0 3.6 4.2 4.3  CL  --    < > 108  --  104  --   --   --  103 106 99  CO2  --    < > 21*  --  26  --   --   --  _0 GLUCOSE  --    < > 125*  --  122*  --   --   --  72 103* 88  BUN  --    < > 46*  --  22*  --   --   --  27* 31* 19  CREATININE  --    < > 5.54*  --  3.64*  --   --   --  4.06* 4.72* 3.77*  CALCIUM  --    < > 8.2*  --  8.2*  --   --   --  8.4* 8.6* 8.1*  PHOS 7.1*  --   --   --   --   --   --   --  3.3 3.6 4.1   < > = values in this  interval not displayed.   GFR: Estimated Creatinine Clearance: 19.1 mL/min (A) (by C-G formula based on SCr of 3.77 mg/dL (H)). Liver Function Tests: Recent Labs  Lab 01/01/20 0251 01/04/20 0153 01/05/20 0235 01/06/20 0133  AST 11*  --   --   --   ALT 13  --   --   --   ALKPHOS 108  --   --   --   BILITOT 0.5  --   --   --   PROT 6.4*  --   --   --   ALBUMIN 2.4* 1.7* 1.8* 1.7*   No results for input(s): LIPASE, AMYLASE in the last 168  hours. No results for input(s): AMMONIA in the last 168 hours. Coagulation Profile: Recent Labs  Lab 12/31/19 1530 01/05/20 0235  INR 1.0 1.0   Cardiac Enzymes: No results for input(s): CKTOTAL, CKMB, CKMBINDEX, TROPONINI in the last 168 hours. BNP (last 3 results) No results for input(s): PROBNP in the last 8760 hours. HbA1C: No results for input(s): HGBA1C in the last 72 hours. CBG: Recent Labs  Lab 01/05/20 1623 01/05/20 2007 01/06/20 0031 01/06/20 0412 01/06/20 0818  GLUCAP 95 77 87 80 97   Lipid Profile: No results for input(s): CHOL, HDL, LDLCALC, TRIG, CHOLHDL, LDLDIRECT in the last 72 hours. Thyroid Function Tests: No results for input(s): TSH, T4TOTAL, FREET4, T3FREE, THYROIDAB in the last 72 hours. Anemia Panel: No results for input(s): VITAMINB12, FOLATE, FERRITIN, TIBC, IRON, RETICCTPCT in the last 72 hours. Sepsis Labs: No results for input(s): PROCALCITON, LATICACIDVEN in the last 168 hours.  Recent Results (from the past 240 hour(s))  Respiratory Panel by RT PCR (Flu A&B, Covid) - Nasopharyngeal Swab     Status: None   Collection Time: 12/31/19  5:23 PM   Specimen: Nasopharyngeal Swab  Result Value Ref Range Status   SARS Coronavirus 2 by RT PCR NEGATIVE NEGATIVE Final    Comment: (NOTE) SARS-CoV-2 target nucleic acids are NOT DETECTED. The SARS-CoV-2 RNA is generally detectable in upper respiratoy specimens during the acute phase of infection. The lowest concentration of SARS-CoV-2 viral copies this assay can detect is 131 copies/mL. A negative result does not preclude SARS-Cov-2 infection and should not be used as the sole basis for treatment or other patient management decisions. A negative result may occur with  improper specimen collection/handling, submission of specimen other than nasopharyngeal swab, presence of viral mutation(s) within the areas targeted by this assay, and inadequate number of viral copies (<131 copies/mL). A negative result  must be combined with clinical observations, patient history, and epidemiological information. The expected result is Negative. Fact Sheet for Patients:  PinkCheek.be Fact Sheet for Healthcare Providers:  GravelBags.it This test is not yet ap proved or cleared by the [place] FDA and  has been authorized for detection and/or diagnosis of SARS-CoV-2 by FDA under an Emergency Use Authorization (EUA). This EUA will remain  in effect (meaning this test can be used) for the duration of the COVID-19 declaration under Section 564(b)(1) of the Act, 21 U.S.C. section 360bbb-3(b)(1), unless the authorization is terminated or revoked sooner.    Influenza A by PCR NEGATIVE NEGATIVE Final   Influenza B by PCR NEGATIVE NEGATIVE Final    Comment: (NOTE) The Xpert Xpress SARS-CoV-2/FLU/RSV assay is intended as an aid in  the diagnosis of influenza from Nasopharyngeal swab specimens and  should not be used as a sole basis for treatment. Nasal washings and  aspirates are unacceptable for Xpert Xpress SARS-CoV-2/FLU/RSV  testing. Fact Sheet for Patients: PinkCheek.be Fact Sheet for Healthcare Providers: GravelBags.it This test is not yet approved or cleared by the [place] FDA and  has been authorized for detection and/or diagnosis of SARS-CoV-2 by  FDA under an Emergency Use Authorization (EUA). This EUA will remain  in effect (meaning this test can be used) for the duration of the  Covid-19 declaration under Section 564(b)(1) of the Act, 21  U.S.C. section 360bbb-3(b)(1), unless the authorization is  terminated or revoked. Performed at [facility], [place] [address], [place], [place] [address]   Surgical pcr screen     Status: Abnormal   Collection Time: 01/01/20  4:27 AM   Specimen: Nasal Mucosa; Nasal Swab  Result Value Ref Range Status   MRSA, PCR NEGATIVE  NEGATIVE Final   Staphylococcus aureus POSITIVE (A) NEGATIVE Final    Comment: (NOTE) The Xpert SA Assay (FDA approved for NASAL specimens in patients 46 years of age and older), is one component of a comprehensive surveillance program. It is not intended to diagnose infection nor to guide or monitor treatment. Performed at [facility], [place] [address], [place], [place] [address]       Radiology Studies: CT HEAD CODE STROKE WO CONTRAST  Result Date: 01/06/2020 CLINICAL DATA:  Code stroke. Acute onset of mental status change. Left-sided weakness and left facial droop. EXAM: CT HEAD WITHOUT CONTRAST TECHNIQUE: Contiguous axial images were obtained from the base of the skull through the vertex without intravenous contrast. COMPARISON:  MR head 11/14/2019 FINDINGS: Brain: An area of faint hypoattenuation is present in the subcortical white matter of the right frontal operculum. There is possible involvement of the cortex. No other focal cortical lesions are present. Remote lacunar infarcts of the basal ganglia and thalami are stable bilaterally. White matter changes of the brainstem are stable. No other acute cortical infarct is present. The ventricles are of normal size. No significant extraaxial fluid collection is present. A remote lacunar infarct of the right cerebellum is again noted. Vascular: Atherosclerotic changes are present within the cavernous internal carotid arteries bilaterally. No hyperdense vessel is present. Skull: Calvarium is intact. No focal lytic or blastic lesions are present. No significant extracranial soft tissue lesion is present. Sinuses/Orbits: The paranasal sinuses and mastoid air cells are clear. The globes and orbits are within normal limits. ASPECTS ([place] Stroke Program Early CT Score) - Ganglionic level infarction (caudate, lentiform nuclei, internal capsule, insula, M1-M3 cortex): 7/7 - Supraganglionic infarction (M4-M6 cortex): 2/3 Total score (0-10 with 10  being normal): 9/10 IMPRESSION: 1. New area of hypoattenuation in the subcortical white matter with probable extension to the cortex in the right frontal operculum. 2. No other acute cortical infarct. 3. Remote lacunar infarcts of the basal ganglia, thalami, and right cerebellum. 4. ASPECTS is 9/10 The above was relayed via text pager to Dr. Leonel Ramsay on 01/06/2020 at 07:29 . Electronically Signed   By: San Morelle M.D.   On: 01/06/2020 07:29     LOS: 6 days   Time spent: More than 50% of that time was spent in counseling and/or coordination of care.  Antonieta Pert, MD [facility]  01/06/2020, 10:44 AM

## 2020-01-06 NOTE — Code Documentation (Addendum)
Code Stroke   Called by nursing staff with concerns of patient having irregular pupils, altered mental status, LT sided weakness, and RT sided facial droop. Per staff, LSN 0400. Code Stroke was activated and I arrived on 4E to see the patient. Per nursing, they had a hard time communicating with the patient as the night went on, so the bedside nurse was not able to distinguish if the altered mental status was new or not.   Upon arrival, patient was alert, [place] speaking primarily - so obtaining an exam was difficult but he was able to tell his name, able to follow some commands - BUE - no motor deficits, BLE 2/5 weakness, able to lift his legs off the bed but not able to keep them up d/t pain (saying "ouch"). Skin - quite warm to touch. SBP 120s, HR normal. Not in acute distress. Pupils - RT 6 mm - non-reactive and LT 3 mm reactive. Patient taken for STAT HEAD CT.   Neuro MD came to CT scanner. CTH was completed - New area of hypoattenuation in the subcortical white matter with probable extension to the cortex in the right frontal operculum. Remote lacunar infarcts of the basal ganglia, thalami, and right cerebellum. 89 - decision made to take patient for MRI as well. Per MD, will not treat as of now.   Plan: -- MRI - patient was taken to MRI straight away since we were already in radiology. Patient was not able to lay still and they were not able to obtain good MRI images. Neuro MD made aware. Cancelled Code Stroke at 0569 prior to go to MRI Suite.  -- Rest per MD, neuro checks Q4H  Call Time 0657 Arrival Time 0702 End Time 7948

## 2020-01-06 NOTE — Consult Note (Signed)
Neurology Consultation Reason for Consult: Pupil Asymmetry Referring Physician: Code Stroke  CC: Pupillary asymmetry  History is obtained from: Nursing  HPI: Phillip Camacho is a 49 y.o. male with a history of CKD, diabetes who was noticed to have pupillary asymmetry on assessment after a bath this morning.  After noticing this, is also noticed that he had some right facial weakness and therefore code stroke was activated.  Due to language barrier, there was some difficulty in performing assessment.  He was taken for stat head CT.  This was negative.  He repeatedly stated that he just wanted to be left alone.  Of note, he has a history of cognitive deficits following previous strokes.  LKW: Unclear tpa given?: no, unclear time of onset    ROS: A 14 point ROS was performed and is negative except as noted in the HPI.   Past Medical History:  Diagnosis Date  . CKD stage 4 due to type 2 diabetes mellitus ([place]) 08/11/2019  . Diabetes mellitus without complication ([place])   . Hyperlipidemia   . Nephrotic syndrome in diabetes mellitus ([place]) 09/17/2019  . PAD (peripheral artery disease) ([place]) 12/31/2019  . Stroke [facility])      Family History  Problem Relation Age of Onset  . Other Neg Hx        patient denies any particular family medical history     Social History:  reports that he has been smoking. He has never used smokeless tobacco. He reports that he does not drink alcohol. No history on file for drug.   Exam: Current vital signs: BP 120/62 (BP Location: Right Arm)   Pulse 99   Temp 100.3 F (37.9 C) (Oral)   Resp 15   Ht 5\' 5"  (1.651 m)   Wt 56.5 kg   SpO2 96%   BMI 20.73 kg/m  Vital signs in last 24 hours: Temp:  [98.2 F (36.8 C)-100.4 F (38 C)] 100.3 F (37.9 C) (03/16 0410) Pulse Rate:  [95-107] 99 (03/16 0410) Resp:  [11-20] 15 (03/16 0410) BP: (103-156)/(54-75) 120/62 (03/16 0410) SpO2:  [96 %-100 %] 96 % (03/16 0410) Weight:  [56.5 kg] 56.5 kg (03/15  1418)   Physical Exam  Constitutional: Appears well-developed and well-nourished.  Psych: Affect appropriate to situation Eyes: No scleral injection HENT: No OP obstrucion MSK: no joint deformities.  Cardiovascular: Normal rate and regular rhythm.  Respiratory: Effort normal, non-labored breathing GI: Soft.  No distension. There is no tenderness.  Skin: WDI  Neuro: Mental Status: Patient is awake, alert, gives month as May and age is 69. He refuses to perform naming stating "you need to stop messing with me" Cranial Nerves: II: He fixates and tracks, but does not cooperate with formal field testing.  Right pupil is nonreactive, left pupil does react III,IV, VI: EOMI without ptosis or diploplia.  V, VII: He has right decreased nasolabial fold, this is also present in his epic photo(e.g. old deficit).  He does not cooperate with smile testing. Motor: He moves all extremities symmetrically, lifts arms against gravity bilaterally, bilateral lower extremities seem to be limited by pain versus simply effort. Sensory: He responds to stimulation bilaterally Cerebellar: Does not perform      I have reviewed labs in epic and the results pertinent to this consultation are: Creatinine 3.7  I have reviewed the images obtained: CT head-possible new hypoattenuation  Impression: 49 year old male with pupillary asymmetry which is well documented in previous notes.  There is a finding on  his head CT which could represent subacute stroke, versus white matter disease, and therefore an MRI would be prudent.  I am not confident, however, that he has any clearly new neurological deficit.   Recommendations: 1) MRI brain 2) further work-up only if positive.   Roland Rack, MD [facility] [phone]  If 7pm- 7am, please page neurology on call as listed in [place].

## 2020-01-06 NOTE — TOC Progression Note (Addendum)
Transition of Care [facility]) - Progression Note    Patient Details  Name: Phillip Camacho MRN: [id number] Date of Birth: [birth year]  Transition of Care [facility]) CM/SW [place], [place] Phone Number: 01/06/2020, 2:25 PM  Clinical Narrative:     Patient has no bed offers.   Possible Barrier to discharge- inadequate insurance.   Expected Discharge Plan: [place] Barriers to Discharge: Continued Medical Work up  Expected Discharge Plan and Services Expected Discharge Plan: [place] arrangements for the past 2 months: Single Family Home                                       Social Determinants of Health (SDOH) Interventions    Readmission Risk Interventions No flowsheet data found.

## 2020-01-06 NOTE — Progress Notes (Signed)
Occupational Therapy Treatment Patient Details Name: Phillip Camacho MRN: [id number] DOB: [birth year] Today's Date: 01/06/2020    History of present illness Patient is a 49 y/o male who presents with left foot pain and ischemia. S/p Left femoral to below-knee popliteal bypass with reverse great saphenous vein and Right internal jugular vein 3/11. s/p tunneled dialysis catheter placement 3/12 due to worsening kidney disease. PMh includes DM, HTN, CKD stage IV and recurrent CVAs   OT comments  Patient more awake this session however required greater level of assistance for bed mobility, sitting balance and sit to stand x2 attempts. Patient requiring max A for bed mobility, from min to max A for maintaining sitting balance as patient attempts to lay himself backwards or towards pillow multiple times and max A x2 sit <>stand, one trial with and one without use of walker. Patient displays visual-motor deficits with + dysmetria and max difficulty with direction following. Will continue to follow.    Follow Up Recommendations  SNF    Equipment Recommendations  Other (comment)(defer to next venue)       Precautions / Restrictions Precautions Precautions: Fall Precaution Comments: watch HR Restrictions Weight Bearing Restrictions: No       Mobility Bed Mobility Overal bed mobility: Needs Assistance Bed Mobility: Supine to Sit;Sit to Supine     Supine to sit: Max assist Sit to supine: Max assist   General bed mobility comments: poor initiation of task with multimodal cues and limited physical effort requiring max A for bed mob  Transfers Overall transfer level: Needs assistance Equipment used: Rolling walker (2 wheeled) Transfers: Sit to/from Stand Sit to Stand: Max assist;+2 physical assistance;+2 safety/equipment         General transfer comment: decreased balance and physical effort from patient therefore unable to maintain standing with walker or with max A x2 for more then few  seconds before returning to bed    Balance Overall balance assessment: Needs assistance Sitting-balance support: Feet supported;Bilateral upper extremity supported Sitting balance-Leahy Scale: Poor Sitting balance - Comments: patient making multiple attempts to lay himself backwards/towards pillow Postural control: Posterior lean Standing balance support: Bilateral upper extremity supported;During functional activity Standing balance-Leahy Scale: Zero Standing balance comment: reliant on B UE support and maximal external assist                           ADL either performed or assessed with clinical judgement   ADL Overall ADL's : Needs assistance/impaired Eating/Feeding: Maximal assistance;Total assistance;Bed level Eating/Feeding Details (indicate cue type and reason): difficulty grasping spoon, visuomotor deficits making it difficult for patient to locate items on tray  Grooming: Total assistance;Wash/dry face Grooming Details (indicate cue type and reason): place wash cloth in patient's hand however does not attempt to wash his face                             Functional mobility during ADLs: Maximal assistance;+2 for physical assistance;+2 for safety/equipment;Cueing for safety;Cueing for sequencing;Rolling walker General ADL Comments: patient requires increased assistance with self care due to impaired cognition, visual-motor coordination, decreased strength, balance,                Cognition Arousal/Alertness: Awake/alert Behavior During Therapy: Flat affect Overall Cognitive Status: No family/caregiver present to determine baseline cognitive functioning Area of Impairment: Orientation;Attention;Following commands;Safety/judgement;Awareness;Problem solving  Orientation Level: Disoriented to;Place;Time;Situation Current Attention Level: Focused   Following Commands: Follows one step commands with increased time;Follows one step  commands inconsistently Safety/Judgement: Decreased awareness of safety;Decreased awareness of deficits Awareness: Intellectual Problem Solving: Slow processing;Decreased initiation;Difficulty sequencing;Requires verbal cues;Requires tactile cues General Comments: difficult to understand patient, has tendency to mumble, did clearly state son's name "jimmy" when asked                   Pertinent Vitals/ Pain       Pain Assessment: Faces Faces Pain Scale: Hurts little more Pain Descriptors / Indicators: Grimacing Pain Intervention(s): Monitored during session         Frequency  Min 2X/week        Progress Toward Goals  OT Goals(current goals can now be found in the care plan section)  Progress towards OT goals: Not progressing toward goals - comment(patient level of assist fluctuating between sessions)  Acute Rehab OT Goals Patient Stated Goal: none stated OT Goal Formulation: Patient unable to participate in goal setting Time For Goal Achievement: 01/09/2020 Potential to Achieve Goals: Good ADL Goals Pt Will Perform Grooming: with min assist;sitting Pt Will Perform Upper Body Dressing: with min assist;sitting Pt Will Perform Lower Body Dressing: with mod assist;sitting/lateral leans Pt Will Transfer to Toilet: with mod assist;bedside commode Additional ADL Goal #1: Patient will follow directions with less than 50% multimodal cuing in order to participate in self care tasks.  Plan Discharge plan remains appropriate    Co-evaluation    PT/OT/SLP Co-Evaluation/Treatment: Yes Reason for Co-Treatment: Complexity of the patient's impairments (multi-system involvement);Necessary to address cognition/behavior during functional activity;For patient/therapist safety;To address functional/ADL transfers   OT goals addressed during session: ADL's and self-care      AM-PAC OT "6 Clicks" Daily Activity     Outcome Measure   Help from another person eating meals?: A Lot Help  from another person taking care of personal grooming?: Total Help from another person toileting, which includes using toliet, bedpan, or urinal?: Total Help from another person bathing (including washing, rinsing, drying)?: Total Help from another person to put on and taking off regular upper body clothing?: A Lot Help from another person to put on and taking off regular lower body clothing?: Total 6 Click Score: 8    End of Session Equipment Utilized During Treatment: Rolling walker;Gait belt  OT Visit Diagnosis: Unsteadiness on feet (R26.81);Other abnormalities of gait and mobility (R26.89);Muscle weakness (generalized) (M62.81);Other symptoms and signs involving cognitive function;Pain Pain - part of body: Leg   Activity Tolerance Patient tolerated treatment well   Patient Left in bed;with call bell/phone within reach;with bed alarm set   Nurse Communication Mobility status;Other (comment)(inability to feed himself)        Time: 4970-2637 OT Time Calculation (min): 31 min  Charges: OT General Charges $OT Visit: 1 Visit OT Treatments $Self Care/Home Management : 8-22 mins  Shon Millet OT OT office: [place] 01/06/2020, 12:23 PM

## 2020-01-06 NOTE — Anesthesia Postprocedure Evaluation (Signed)
Anesthesia Post Note  Patient: Phillip Camacho  Procedure(s) Performed: LEFT ARTERIOVENOUS (AV) FISTULA CREATION (Left Arm Lower)     Patient location during evaluation: PACU Anesthesia Type: MAC Level of consciousness: awake and alert Pain management: pain level controlled Vital Signs Assessment: post-procedure vital signs reviewed and stable Respiratory status: spontaneous breathing, nonlabored ventilation, respiratory function stable and patient connected to nasal cannula oxygen Cardiovascular status: stable and blood pressure returned to baseline Postop Assessment: no apparent nausea or vomiting Anesthetic complications: no    Last Vitals:  Vitals:   01/06/20 0030 01/06/20 0410  BP: (!) 121/56 120/62  Pulse: 100 99  Resp: 15 15  Temp: 37.7 C 37.9 C  SpO2: 97% 96%    Last Pain:  Vitals:   01/06/20 0410  TempSrc: Oral  PainSc:    Pain Goal:                   Natavia Sublette

## 2020-01-07 ENCOUNTER — Inpatient Hospital Stay (HOSPITAL_COMMUNITY): Payer: 59

## 2020-01-07 DIAGNOSIS — I639 Cerebral infarction, unspecified: Secondary | ICD-10-CM

## 2020-01-07 DIAGNOSIS — R509 Fever, unspecified: Secondary | ICD-10-CM

## 2020-01-07 LAB — CBC
HCT: 25.7 % — ABNORMAL LOW (ref 39.0–52.0)
Hemoglobin: 7.7 g/dL — ABNORMAL LOW (ref 13.0–17.0)
MCH: 27.7 pg (ref 26.0–34.0)
MCHC: 30 g/dL (ref 30.0–36.0)
MCV: 92.4 fL (ref 80.0–100.0)
Platelets: 529 10*3/uL — ABNORMAL HIGH (ref 150–400)
RBC: 2.78 MIL/uL — ABNORMAL LOW (ref 4.22–5.81)
RDW: 14.6 % (ref 11.5–15.5)
WBC: 22.8 10*3/uL — ABNORMAL HIGH (ref 4.0–10.5)
nRBC: 0 % (ref 0.0–0.2)

## 2020-01-07 LAB — RENAL FUNCTION PANEL
Albumin: 1.5 g/dL — ABNORMAL LOW (ref 3.5–5.0)
Anion gap: 20 — ABNORMAL HIGH (ref 5–15)
BUN: 43 mg/dL — ABNORMAL HIGH (ref 6–20)
CO2: 22 mmol/L (ref 22–32)
Calcium: 8.2 mg/dL — ABNORMAL LOW (ref 8.9–10.3)
Chloride: 97 mmol/L — ABNORMAL LOW (ref 98–111)
Creatinine, Ser: 6.27 mg/dL — ABNORMAL HIGH (ref 0.61–1.24)
GFR calc Af Amer: 11 mL/min — ABNORMAL LOW (ref >60)
GFR calc non Af Amer: 10 mL/min — ABNORMAL LOW (ref >60)
Glucose, Bld: 139 mg/dL — ABNORMAL HIGH (ref 70–99)
Phosphorus: 5.1 mg/dL — ABNORMAL HIGH (ref 2.5–4.6)
Potassium: 4.5 mmol/L (ref 3.5–5.1)
Sodium: 139 mmol/L (ref 135–145)

## 2020-01-07 LAB — HEMOGLOBIN A1C
Hgb A1c MFr Bld: 7.5 % — ABNORMAL HIGH (ref 4.8–5.6)
Mean Plasma Glucose: 168.55 mg/dL

## 2020-01-07 LAB — GLUCOSE, CAPILLARY
Glucose-Capillary: 113 mg/dL — ABNORMAL HIGH (ref 70–99)
Glucose-Capillary: 126 mg/dL — ABNORMAL HIGH (ref 70–99)
Glucose-Capillary: 135 mg/dL — ABNORMAL HIGH (ref 70–99)
Glucose-Capillary: 138 mg/dL — ABNORMAL HIGH (ref 70–99)
Glucose-Capillary: 146 mg/dL — ABNORMAL HIGH (ref 70–99)
Glucose-Capillary: 151 mg/dL — ABNORMAL HIGH (ref 70–99)

## 2020-01-07 LAB — LACTIC ACID, PLASMA
Lactic Acid, Venous: 1.1 mmol/L (ref 0.5–1.9)
Lactic Acid, Venous: 0.9 mmol/L (ref 0.5–1.9)

## 2020-01-07 LAB — SARS CORONAVIRUS 2 (TAT 6-24 HRS): SARS Coronavirus 2: NEGATIVE

## 2020-01-07 MED ORDER — HEPARIN SODIUM (PORCINE) 1000 UNIT/ML IJ SOLN
INTRAMUSCULAR | Status: AC
  Administered 2020-01-07: 1500 [IU]
  Filled 2020-01-07: qty 3

## 2020-01-07 MED ORDER — SODIUM CHLORIDE 0.9 % IV SOLN
1.0000 g | Freq: Once | INTRAVENOUS | Status: AC
  Administered 2020-01-07: 12:00:00 1 g via INTRAVENOUS
  Filled 2020-01-07: qty 1

## 2020-01-07 MED ORDER — SODIUM CHLORIDE 0.9 % IV BOLUS
500.0000 mL | Freq: Once | INTRAVENOUS | Status: AC
  Administered 2020-01-07: 16:00:00 250 mL via INTRAVENOUS

## 2020-01-07 MED ORDER — ACETAMINOPHEN 325 MG PO TABS: 650.0000 mg | ORAL_TABLET | ORAL | Status: DC | PRN

## 2020-01-07 MED ORDER — VANCOMYCIN HCL 1250 MG/250ML IV SOLN
1250.0000 mg | INTRAVENOUS | Status: AC
  Administered 2020-01-07: 1250 mg via INTRAVENOUS
  Filled 2020-01-07: qty 250

## 2020-01-07 MED ORDER — SODIUM CHLORIDE 0.9 % IV SOLN
1.0000 g | INTRAVENOUS | Status: DC
  Administered 2020-01-08 – 2020-01-12 (×5): 1 g via INTRAVENOUS
  Filled 2020-01-07 (×6): qty 1

## 2020-01-07 MED ORDER — ACETAMINOPHEN 650 MG RE SUPP
650.0000 mg | RECTAL | Status: DC | PRN
  Administered 2020-01-07: 650 mg via RECTAL
  Filled 2020-01-07: qty 1

## 2020-01-07 MED ORDER — HEPARIN SODIUM (PORCINE) 1000 UNIT/ML IJ SOLN: 1500.0000 [IU] | Freq: Once | INTRAMUSCULAR | Status: AC

## 2020-01-07 MED ORDER — ACETAMINOPHEN 650 MG RE SUPP
650.0000 mg | Freq: Once | RECTAL | Status: AC
  Administered 2020-01-07: 16:00:00 650 mg via RECTAL
  Filled 2020-01-07: qty 1

## 2020-01-07 MED ORDER — VANCOMYCIN HCL IN DEXTROSE 500-5 MG/100ML-% IV SOLN
500.0000 mg | INTRAVENOUS | Status: DC
  Filled 2020-01-07 (×2): qty 100

## 2020-01-07 MED ORDER — ACETAMINOPHEN 650 MG RE SUPP: 650.0000 mg | RECTAL | Status: DC | PRN

## 2020-01-07 NOTE — Progress Notes (Signed)
[facility]  Subjective  - Non responsive to verbal ques.   Objective (!) 124/56 (!) 102 (!) 101.9 F (38.8 C) (Oral) 17 96%  Intake/Output Summary (Last 24 hours) at 01/07/2020 0814 Last data filed at 01/06/2020 0934 Gross per 24 hour  Intake 3 ml  Output --  Net 3 ml      Foot is warm to touch, with ischemic changes to toes 3-5 and dorsum of foot.  Left fistula with healing incision and palpable thrill Currently on HD  Assessment/Planning:  49 y.o. male is s/p L brachiocephalic fistula creation 2 Day Post-Op  L femoral to popliteal bypass with vein 6 days post op  Neurology following after possible stroke like symptoms.  Patient will not interact with verbal ques this am.  Ischemic left foot changes despite patent bypass.  Will allow the foot to demarcate.  Leukocytosis with WBC of 22 and Tm 101.9.  Roxy Horseman 01/07/2020 8:14 AM --  Laboratory Lab Results: Recent Labs    01/06/20 0831 01/07/20 0412  WBC 19.1* 22.8*  HGB 8.7* 7.7*  HCT 29.1* 25.7*  PLT 610* 529*   BMET Recent Labs    01/06/20 0133 01/07/20 0412  NA 140 139  K 4.3 4.5  CL 99 97*  CO2 22 22  GLUCOSE 88 139*  BUN 19 43*  CREATININE 3.77* 6.27*  CALCIUM 8.1* 8.2*    COAG Lab Results  Component Value Date   INR 1.0 01/05/2020   INR 1.0 12/31/2019   INR 0.9 11/14/2019   No results found for: PTT

## 2020-01-07 NOTE — Progress Notes (Signed)
[facility] Progress Note   Assessment/ Plan:   Pt is a 49 y.o. yo male with DM. HTN, history of CVA's and progressive CKD-  Followed by Dr. Durwin Reges mose recently who was admitted on 12/31/2019 with and ischemic foot   Assessment/Plan: 1. Renal-  Progressive CKD, failure to thrive and many episodes of hyperkalemia so decision has been made to go ahead and initiate chronic dialysis for ESRD.  S/p [facility] and first HD treatment 3/12  Second treatment Monday 3/15.  HD 3/17, s/p AVF 3/15, appreciate VVS.  Searching for OP HD unit- lives in [place] but seem to be looking at SNF as pt cannot be left alone and son works.  Renal navigator on the case.  2. Hyperkalemia-  Improved with medical treatment -  stopped lokelma since starting HD 3. Anemia- iron stores low-  Need to repleting with ferrlecit  and also started ESA 4. Secondary hyperparathyroidism- phos high, start binder PTH pending still 5. HTN/volume-  BP high- norvasc 10 and prn hydralazine, will change to scheduled- BP much better 6. Metabolic acidosis-  resolved 7. Ischemic foot- s/p fempop bypass per vasc.   8.  Multiple CVA's-  On secondary prevention.  Neuro following, possible event AM 3/16.  Hopefully getting MRI today. 9.  Sepsis: worsening leukocytosis, blood cultures collected peripherally 3/16, will get cultures from HD cath, CXR looks clear on my read, will start vanc/ cefepime and go from there.    10.  Dispo-  Needs SNF due to confusion and eyesight.  It appears that prognosis is poor.  Downturn- could consider pall c/s    Subjective:    Tmax 101.9 this AM, looks lethargic.  Overall not doing well.     Objective:   BP (!) 112/59   Pulse (!) 105   Temp (!) 101.9 F (38.8 C) (Oral)   Resp 17   Ht 5\' 5"  (1.651 m)   Wt 55.9 kg   SpO2 96%   BMI 20.51 kg/m   Physical Exam: Gen: sleeping, moves, moans, difficult to communicate CVS: RRR Resp: clear bilaterally  Abd: soft Ext: no LE edema, L leg with  well-healing incisions and erythema/warmth of the L foot, possible streaking redness upwards.  Ischemic changes of the toes. ACCESS: R IJ TDC, L AVF + T/B  Labs: BMET Recent Labs  Lab 01/01/20 0311 01/01/20 1225 01/01/20 2236 01/02/20 0340 01/02/20 0651 01/02/20 1123 01/03/20 0331 01/03/20 0331 01/03/20 1050 01/03/20 1451 01/03/20 1845 01/04/20 0153 01/05/20 0235 01/06/20 0133 01/07/20 0412  NA  --   --  143  --  141  --  144  --   --   --   --  140 141 140 139  K  --    < > 4.9   < > 4.5   < > 4.0   < > 4.1 4.4 4.0 3.6 4.2 4.3 4.5  CL  --   --  110  --  108  --  104  --   --   --   --  103 106 99 97*  CO2  --   --  18*  --  21*  --  26  --   --   --   --  26 26 22 22   GLUCOSE  --   --  110*  --  125*  --  122*  --   --   --   --  72 103* 88 139*  BUN  --   --  48*  --  46*  --  22*  --   --   --   --  27* 31* 19 43*  CREATININE  --   --  5.77*  --  5.54*  --  3.64*  --   --   --   --  4.06* 4.72* 3.77* 6.27*  CALCIUM  --   --  8.0*  --  8.2*  --  8.2*  --   --   --   --  8.4* 8.6* 8.1* 8.2*  PHOS 7.1*  --   --   --   --   --   --   --   --   --   --  3.3 3.6 4.1 5.1*   < > = values in this interval not displayed.   CBC Recent Labs  Lab 12/31/19 1530 12/31/19 1530 01/01/20 0251 01/01/20 2236 01/04/20 0153 01/05/20 0235 01/06/20 0831 01/07/20 0412  WBC 14.5*   < > 12.6*   < > 14.7* 17.0* 19.1* 22.8*  NEUTROABS 10.4*  --  8.2*  --   --   --   --   --   HGB 10.6*   < > 10.2*   < > 7.9* 8.1* 8.7* 7.7*  HCT 35.0*   < > 32.2*   < > 25.7* 25.9* 29.1* 25.7*  MCV 91.6   < > 89.4   < > 89.5 91.8 93.6 92.4  PLT 748*   < > 711*   < > 479* 531* 610* 529*   < > = values in this interval not displayed.      Medications:    . amLODipine  10 mg Oral Daily  . aspirin EC  81 mg Oral Daily  . atorvastatin  80 mg Oral q1800  . calcium acetate  667 mg Oral TID WC  . Chlorhexidine Gluconate Cloth  6 each Topical Q0600  . darbepoetin (ARANESP) injection - DIALYSIS  100 mcg  Intravenous Q Mon-HD  . heparin      . heparin  5,000 Units Subcutaneous Q8H  . hydrALAZINE  25 mg Oral Q8H  . insulin aspart  0-9 Units Subcutaneous Q4H  . mupirocin ointment  1 application Nasal BID  . sodium chloride flush  3 mL Intravenous Q12H  . ticagrelor  90 mg Oral BID  . vancomycin  500 mg Intravenous Q M,W,F-HD     Madelon Lips, MD 01/07/2020, 10:14 AM

## 2020-01-07 NOTE — Progress Notes (Signed)
Patient arrived on HD unit unresponsive, temp. was 101.9, MD notified and she ordered chest X ray, blood culture and Vanco IV, patient became even more unresponsive by the end of the treatment, RRT called and she said patient haven been like that on the floor. We continue to monitor.

## 2020-01-07 NOTE — Progress Notes (Signed)
Left lower extremity venous duplex exam completed.  Preliminary results can be found under CV proc under chart review.  01/07/2020 1:20 PM  Senaida Chilcote, K., RDMS, RVT

## 2020-01-07 NOTE — Progress Notes (Signed)
Pharmacy Antibiotic Note  Phillip Camacho is a 49 y.o. male admitted on 12/31/2019 with sepsis.  Pharmacy has been consulted for Vancomycin / Cefepime dosing.  New HD patient  Plan: Vancomycin 1250 mg iv x 1 then 500 mg IV MWF Cefepime 1 gram iv Q 24 hours  Follow up HD tolerance  Height: 5\' 5"  (165.1 cm) Weight: 123 lb 3.8 oz (55.9 kg) IBW/kg (Calculated) : 61.5  Temp (24hrs), Avg:100.3 F (37.9 C), Min:99.4 F (37.4 C), Max:101.9 F (38.8 C)  Recent Labs  Lab 01/03/20 0331 01/04/20 0153 01/05/20 0235 01/06/20 0133 01/06/20 0831 01/07/20 0412  WBC 13.4* 14.7* 17.0*  --  19.1* 22.8*  CREATININE 3.64* 4.06* 4.72* 3.77*  --  6.27*    Estimated Creatinine Clearance: 11.4 mL/min (A) (by C-G formula based on SCr of 6.27 mg/dL (H)).    No Known Allergies  Thank you Anette Guarneri, PharmD  01/07/2020 8:59 AM

## 2020-01-07 NOTE — Procedures (Signed)
Patient seen and examined on Hemodialysis. BP (!) 112/59   Pulse (!) 105   Temp (!) 101.9 F (38.8 C) (Oral)   Resp 17   Ht 5\' 5"  (1.651 m)   Wt 55.9 kg   SpO2 96%   BMI 20.51 kg/m   QB 400 mL/ min via TDC UF goal even  Lethargic and febrile.  See progress note   Madelon Lips MD 10:18 AM

## 2020-01-07 NOTE — Progress Notes (Signed)
Upon returning from vascular study, pt noted to be very hot with shaking chills and only responsive to pain.  Patient is able to be awakened. Temp 102.7, HR 120's. BP soft. MD paged.  Rapid Response called. Order for 250 bolus and rectal tylenol. Mews noted to be increased.  Frequent vitals being cycled as well as close monitoring.

## 2020-01-07 NOTE — Significant Event (Addendum)
Rapid Response Event Note  Overview: Neurologic - Decreased LOC/AMS  Initial Focused Assessment: Called urgently to HD, per HD RN, patient was not responsive - blood sugar was checked - it was normal. Per staff, VSS but patient would not sternal rub. I saw Phillip Camacho for neurologic symptoms yesterday as well. Upon arrival, patient was very warm to touch, had chills as well. He did respond to painful stimuli for me, localized to pain in all extremities and then spontaneously moved all extremities, he has irregular pupils - well documented in his chart. 100% on RA and stable HR/BP but I am quite certain he is febrile. Foot is warm to touch, with ischemic changes to toes 3-5 and dorsum of foot -- black areas noted.   Interventions: -- NO RRT Interventions  Plan of Care: -- Patient to return to 4E - his treatment was completed. I called bedside nurse and her to check rectal temperature on the patient and if patient is febrile, treat temperature (APAP PR) and notify MD on call  -- If fever is present, cool the patient - ice packs/external cooling etc. -- Monitor VS and neurologic status as ordered -- Rest per MD   Call Time 1022  Arrival Time 1023  End Time [place], [place]

## 2020-01-07 NOTE — Significant Event (Addendum)
Rapid Response Event Note  Overview: MEWS and Decreased LOC   Initial Focused Assessment: Called back to see patient for HR in the mid 120s and SBP in the 80s. I came up to see the patient and he was still experiencing rigors/chills, skin very warm to touch, febrile - temperature 102.7 rectal. He was still not responsive, withdrew pain in all extremities, he was able to hold his arms up bilaterally, pupils RT 6 mm nonreactive LT 3 mm brisk (this is not a new finding). LLE appears to be the same in presentation, 99% on RA, normal respiratory effort, does have a cough currently though. I utilized the Marketing executive as well but patient did not respond to the translator.   I spoke with the [facility] MD - updated MD with my concerns. I had the nurse VVS team as well and inform them as well of patient's current condition. I paged Neuro MD on call as well, asked that they see the patient as well. MRI + several acute infarcts bilaterally.   Interventions: -- NS 250 cc Bolus - BP improved - normotensive  -- APAP PR -- BCX from HD Catheter -- Lactic Acid  -- Repeat COVID test   Plan of Care: -- I was called away to an emergency but I called the nurse at 1730, per nurse, temperature is now 99.9 (still probably febrile since this was an axillary temperature) and HR back down to the low 100s. Mental status is unchanged -- Monitor VS, recheck temperatures and treat if necessary -- Monitor neurologic status  -- Aspiration Precautions - high risk for aspiration given patient's ongoing altered mental status -- Delirium Precautions  -- Perhaps an ID consult >? -- Rest per medical team  Event Summary:  Call Time 1539 Arrival Time 1542 End Time 1635  Asma Boldon R

## 2020-01-07 NOTE — Progress Notes (Signed)
NEUROLOGY PROGRESS NOTE   Subjective: Patient is encephalopathic but does respond to pain and is moving extremities spontaneously  Exam: Vitals:   01/07/20 1624 01/07/20 1630  BP: 111/71 99/82  Pulse:  (!) 116  Resp: 18 20  Temp:    SpO2:  99%   Physical Exam  Constitutional: Cachectic and malnourished.  Eyes: No scleral injection HENT: No OP obstrucion Head: Normocephalic.  Cardiovascular: Normal rate and regular rhythm.  Respiratory: Effort normal, non-labored breathing GI: Soft.  No distension. There is no tenderness.  Skin: WDI-multiple old ulcerations and bruises, toes are black from micro emboli Neuro:  Mental Status: Patient responds to noxious stimuli and localizes to pain.  At times pushes practitioner away.  Does not follow commands and is encephalopathic Cranial Nerves:  III,IV, VI: ptosis not present, right pupil is 4 mm, nonreactive and irregular, left pupil is 2 mm and sluggishly reactive  V,VII: Face symmetric, facial light touch sensation normal bilaterally Motor: Right : Upper extremity   5/5    Left:     Upper extremity   5/5 -Left lower extremity withdraws from pain, right lower extremity withdraws from pain  Has decreased bulk Sensory: Responds to noxious stimuli  Attempted to get the interpreter on iPad but could not get an interpreter for a good 15 minutes.  Exam limited by lack of interpreter.  Medications:  Scheduled: . aspirin EC  81 mg Oral Daily  . atorvastatin  80 mg Oral q1800  . calcium acetate  667 mg Oral TID WC  . Chlorhexidine Gluconate Cloth  6 each Topical Q0600  . darbepoetin (ARANESP) injection - DIALYSIS  100 mcg Intravenous Q Mon-HD  . heparin  5,000 Units Subcutaneous Q8H  . insulin aspart  0-9 Units Subcutaneous Q4H  . mupirocin ointment  1 application Nasal BID  . sodium chloride flush  3 mL Intravenous Q12H  . ticagrelor  90 mg Oral BID  . vancomycin  500 mg Intravenous Q M,W,F-HD   Continuous: . sodium chloride 10 mL/hr  at 01/02/20 0916  . [START ON 01/08/2020] ceFEPime (MAXIPIME) IV     SUO:[id number] **OR** acetaminophen, fentaNYL (SUBLIMAZE) injection, hydrALAZINE, morphine injection, ondansetron **OR** ondansetron (ZOFRAN) IV, oxyCODONE-acetaminophen  Pertinent Labs/Diagnostics: Blood cultures pending WBC 22 which is increased from 17 two days ago  MR BRAIN WO CONTRAST Result Date: 01/07/2020  IMPRESSION: Numerous small acute infarctions involving bilateral anterior and posterior circulations. Multiple chronic infarcts. Electronically Signed   By: Macy Mis M.D.   On: 01/07/2020 15:13    CT HEAD CODE STROKE WO CONTRAST Result Date: 01/06/2020  IMPRESSION: 1. New area of hypoattenuation in the subcortical white matter with probable extension to the cortex in the right frontal operculum. 2. No other acute cortical infarct. 3. Remote lacunar infarcts of the basal ganglia, thalami, and right cerebellum. 4. ASPECTS is 9/10 The above was relayed via text pager to Dr. Leonel Ramsay on 01/06/2020 at 07:29 . Electronically Signed   By: San Morelle M.D.   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  MRI was obtained reveals scattered multiple embolic strokes throughout both hemispheres likely to be septic emboli.  Recommendations below  Impression: -Scattered embolic infarcts in both hemispheres -evaluate for septic emboli -Encephalopathy -Sepsis  Recommendations: -Blood cultures -Broad-spectrum antibiotics -Repeat TTE and  if needed TEE -Vascular carotid duplex -No need to do hypercoagulable panel as he has recently had this done in March -Obtain EEG Stroke team will follow patient in the morning   01/07/2020, 5:10 PM  Attending  Neurohospitalist Addendum Patient seen and examined with APP/Resident. Agree with the history and physical as documented above. Agree with the plan as documented, which I helped formulate. I have independently reviewed the chart, obtained history, review of systems and examined the patient.I have personally reviewed pertinent head/neck/spine imaging (CT/MRI). Please feel free to call with any questions. --- Amie Portland, MD [facility] Pager: [phone]  If 7pm to 7am, please call on call as listed on AMION.

## 2020-01-07 NOTE — Progress Notes (Signed)
PROGRESS NOTE    Phillip Camacho  KNL:[id number] DOB: May 22, 1971 DOA: 12/31/2019 PCP: [name], MD   Brief Narrative: As per admission HPI: 49 y.o. 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  Last hospitalized in January 2021 for a stroke since then has been confused, not been eating or drinking, needing supervision with ADLs. In the ED found to have potassium 6.6, bicarbonate 17, BUN 50, and creatinine 5.69, up from 4.8 in January 2021.  CBC notable for leukocytosis to 14,500, stable normocytic anemia, and increased thrombocytosis with platelets of 748,000.  INR is normal.  Covid and influenza PCR are negative.    Patient received temporizing measures with insulin/dextrose, bicarbonate, and Lokelma, nephrology/vascular surgery consulted, patient was admitted for critical left leg ischemia and hyperkalemia and renal failure. 3/11-left femoral to below-knee popliteal bypass.  Patient seen by nephrology and also started on dialysis after placing TDC.  Subjective: Seen in dialysis this morning Overnight patient febrile T-max 101.9 this morning He appears confused needing mitten, lethargic WBC further up at 22.8K Labs showed hemoglobin 7.7 g, BUN 43 creatinine 6.2. Blood culture sent from dialysis catheter from dialysis and starting on vancomycin and cefepime  Assessment & Plan:  Progressive CKD stage 4 due to poorly controlled diabetes and hypertension: nephro on board, work-up negative so far with negative HIV, hep C, hep B.  Holding home losartan. status post tunnel dialysis catheter placement and initiation of dialysis 3/12, AV fistula placed 3/15. Cont HD per nephro and will need clip and skilled nursing facility placement.  Recent Labs  Lab 01/03/20 0331 01/04/20 0153 01/05/20 0235 01/06/20 0133 01/07/20 0412  BUN 22*  27* 31* 19 43*  CREATININE 3.64* 4.06* 4.72* 3.77* 6.27*   Critical ischemia of left lower extremity, 3-5 left toes tip blackish:  S/p Left femoral to below-knee popliteal bypass with reverse great saphenous vein 3/11.  Appreciate vascular input, monitor distal pulses.  Continue aspirin, Brilinta and Lipitor.  Pain control.  Left leg is mildly swollen and red, watch for any kind of infection. no Evidence of DVT on lower extremities.  Sepsis with fever/acute encephalopathy: Blood culture ordered 3/16- pending, duplex of the lower extremity ordered.  Has leukocytosis.  Will obtained chest x-ray- no acute findings. Check UA, monitor temperature curve.  Mild erythema on the left leg and placed on empiric doxycycline 3/16. He has RT IJ HD line-Blood culture sent from dialysis catheter from dialysis and started on vancomycin and cefepime  Acute encephalopathy likely multifactorial, metabolic with fever, he had lethargy with Pupillary asymmetry ( chronic). First noted 3/16 am after bath - code stroke activated 3/16- seen by neuro-CT head area of hypoattenuation in the subcortical white matter with probable extension to the cortex in the right frontal operculum, remote infarcts, seen by neurologist and MRI ordered-unable to be completed at he was moving.  Still appears lethargic this morning-monitor closely.    Severe Hyperkalemia: resolved Metabolic acidosis from renal dysfunction-resolved Nephrotic syndrome presented to diabetic nephropathy.  Monitor.  Insulin-requiring or dependent type II diabetes mellitus, hemoglobin A1c is poorly controlled 8.6 on 1/23.  Blood sugar stable, we will continue sliding scale insulin.   Recent Labs  Lab 01/06/20 1207 01/06/20 1602 01/06/20 2007 01/07/20 0000 01/07/20 0436  GLUCAP 117* 132* 142* 138* 135*   History recurrent CVAs:Patient was admitted in January with recurrent  CVA, likely embolic, has had increased weakness on the left and cognitive deficits since  then. Cont home Brilinta, aspirin and Lipitor.    Hypertension,controlled, continue home Norvasc,  As needed hydralazine.  Continue to dialyse.  HLD:on statins.  Tobacco POE:UMPN need to advise cessation.  Anemia of CKD-hemoglobin is stable at 8.3 g.Monitor transfuse for less than 7 g.    Social issues:Patient lives with his son who works during the day and has had difficulty caring for the patient who has been frequently confused since the recent strokes and is often unable to perform basic ADLs on his own.  Body mass index is 20.51 kg/m.   DVT prophylaxis:Heparin. Code Status:Full.  Family Communication:I have called son and updated-he agrees for skilled nursing facility.  I have updated patient's son over the phone.  Disposition Plan: Patient is from:Home Anticipated Disposition:PT recommends SNF.  Son is at work during daytime unable to look after him and he agrees for a skilled nursing facility  For ADL and eye sight isseus. Barriers to discharge or conditions that needs to be met prior to discharge: Patient was admitted with progressive CKD and critical ischemia of left leg status post bypass surgery, and with worsening renal failure now on dialysis.Code stroke activated for pupillary asymmetry 3/16 and also now having fever work-up in progress.He has been lethargic.  And remains hospitalized for ongoing medical management.  Plan for SNF and needs CLIP for discharge  Consultants: Nephrology, vascular surgery.   Procedures:  3/11: Left femoral to below-knee popliteal bypass with reverse great saphenous vein  Microbiology:see note  Medications: Scheduled Meds: . amLODipine  10 mg Oral Daily  . aspirin EC  81 mg Oral Daily  . atorvastatin  80 mg Oral q1800  . calcium acetate  667 mg Oral TID WC  . Chlorhexidine Gluconate Cloth  6 each Topical Q0600  . darbepoetin (ARANESP) injection - DIALYSIS  100 mcg Intravenous Q Mon-HD  . doxycycline  100 mg Oral Q12H  . heparin  5,000  Units Subcutaneous Q8H  . hydrALAZINE  25 mg Oral Q8H  . insulin aspart  0-9 Units Subcutaneous Q4H  . mupirocin ointment  1 application Nasal BID  . sodium chloride flush  3 mL Intravenous Q12H  . ticagrelor  90 mg Oral BID   Continuous Infusions: . sodium chloride 10 mL/hr at 01/02/20 0916    Antimicrobials: Anti-infectives (From admission, onward)   Start     Dose/Rate Route Frequency Ordered Stop   01/06/20 1130  doxycycline (VIBRA-TABS) tablet 100 mg     100 mg Oral Every 12 hours 01/06/20 1045     01/05/20 0000  ceFAZolin (ANCEF) IVPB 1 g/50 mL premix    Note to Pharmacy: Send with pt to OR   1 g 100 mL/hr over 30 Minutes Intravenous On call 01/04/20 1134 01/05/20 0735   01/02/20 0927  ceFAZolin (ANCEF) 2-4 GM/100ML-% IVPB    Note to Pharmacy: [name]   : cabinet override      01/02/20 0927 01/02/20 0945   01/01/20 1600  ceFAZolin (ANCEF) IVPB 2g/100 mL premix     2 g 200 mL/hr over 30 Minutes Intravenous Every 8 hours 01/01/20 1224 01/02/20 0125   01/01/20 0706  ceFAZolin (ANCEF) 2-4 GM/100ML-% IVPB    Note to Pharmacy: Lelon Perla   : cabinet override      01/01/20 0706 01/01/20 1914   12/31/19 1745  ceFAZolin (ANCEF) IVPB 2g/100 mL premix     2 g 200  mL/hr over 30 Minutes Intravenous On call to O.R. 12/31/19 1730 01/01/20 0559       Objective: Vitals: Today's Vitals   01/07/20 0648 01/07/20 0652 01/07/20 0700 01/07/20 0730  BP: 127/66 (!) 146/70 125/62 123/62  Pulse: (!) 106 (!) 107 (!) 106 (!) 102  Resp: 17     Temp: (!) 101.9 F (38.8 C)     TempSrc: Oral     SpO2: 96%     Weight: 55.9 kg     Height:      PainSc: 0-No pain       Intake/Output Summary (Last 24 hours) at 01/07/2020 0741 Last data filed at 01/06/2020 0934 Gross per 24 hour  Intake 3 ml  Output --  Net 3 ml   Filed Weights   01/05/20 1418 01/07/20 0437 01/07/20 0648  Weight: 56.5 kg 54.8 kg 55.9 kg   Weight change: -1.7 kg   Intake/Output from previous day: 03/16 0701  - 03/17 0700 In: 3 [I.V.:3] Out: -  Intake/Output this shift: No intake/output data recorded.  Examination:  General exam: Lethargic, weak frail,  HEENT:Oral mucosa moist, Ear/Nose WNL grossly,dentition normal. Respiratory system: b/l clear, no crackles, no use of accessory muscles.  \ Cardiovascular system: S1 & S2 +, regular, No JVD. Gastrointestinal system: Abdomen soft, NT,ND, BS+. Nervous System:Alert, awake, left sided weakness  Extremities: Mild erythema and edema on the left leg, 3-5 toes with blackish discoloration, sirgical scar intact on left leg. No drainage.   Skin: No rashes,no icterus. MSK: Normal muscle bulk,tone,  Data Reviewed: I have personally reviewed following labs and imaging studies CBC: Recent Labs  Lab 12/31/19 1530 12/31/19 1530 01/01/20 0251 01/01/20 2236 01/03/20 0331 01/04/20 0153 01/05/20 0235 01/06/20 0831 01/07/20 0412  WBC 14.5*   < > 12.6*   < > 13.4* 14.7* 17.0* 19.1* 22.8*  NEUTROABS 10.4*  --  8.2*  --   --   --   --   --   --   HGB 10.6*   < > 10.2*   < > 8.3* 7.9* 8.1* 8.7* 7.7*  HCT 35.0*   < > 32.2*   < > 26.3* 25.7* 25.9* 29.1* 25.7*  MCV 91.6   < > 89.4   < > 89.5 89.5 91.8 93.6 92.4  PLT 748*   < > 711*   < > 483* 479* 531* 610* 529*   < > = values in this interval not displayed.   Basic Metabolic Panel: Recent Labs  Lab 01/01/20 0311 01/01/20 1225 01/03/20 0331 01/03/20 1050 01/03/20 1845 01/04/20 0153 01/05/20 0235 01/06/20 0133 01/07/20 0412  NA  --    < > 144  --   --  140 141 140 139  K  --    < > 4.0   < > 4.0 3.6 4.2 4.3 4.5  CL  --    < > 104  --   --  103 106 99 97*  CO2  --    < > 26  --   --  26 26 22 22   GLUCOSE  --    < > 122*  --   --  72 103* 88 139*  BUN  --    < > 22*  --   --  27* 31* 19 43*  CREATININE  --    < > 3.64*  --   --  4.06* 4.72* 3.77* 6.27*  CALCIUM  --    < > 8.2*  --   --  8.4* 8.6* 8.1* 8.2*  PHOS 7.1*  --   --   --   --  3.3 3.6 4.1 5.1*   < > = values in this interval not  displayed.   GFR: Estimated Creatinine Clearance: 11.4 mL/min (A) (by C-G formula based on SCr of 6.27 mg/dL (H)). Liver Function Tests: Recent Labs  Lab 01/01/20 0251 01/04/20 0153 01/05/20 0235 01/06/20 0133 01/07/20 0412  AST 11*  --   --   --   --   ALT 13  --   --   --   --   ALKPHOS 108  --   --   --   --   BILITOT 0.5  --   --   --   --   PROT 6.4*  --   --   --   --   ALBUMIN 2.4* 1.7* 1.8* 1.7* 1.5*   No results for input(s): LIPASE, AMYLASE in the last 168 hours. No results for input(s): AMMONIA in the last 168 hours. Coagulation Profile: Recent Labs  Lab 12/31/19 1530 01/05/20 0235  INR 1.0 1.0   Cardiac Enzymes: No results for input(s): CKTOTAL, CKMB, CKMBINDEX, TROPONINI in the last 168 hours. BNP (last 3 results) No results for input(s): PROBNP in the last 8760 hours. HbA1C: No results for input(s): HGBA1C in the last 72 hours. CBG: Recent Labs  Lab 01/06/20 1207 01/06/20 1602 01/06/20 2007 01/07/20 0000 01/07/20 0436  GLUCAP 117* 132* 142* 138* 135*   Lipid Profile: No results for input(s): CHOL, HDL, LDLCALC, TRIG, CHOLHDL, LDLDIRECT in the last 72 hours. Thyroid Function Tests: No results for input(s): TSH, T4TOTAL, FREET4, T3FREE, THYROIDAB in the last 72 hours. Anemia Panel: No results for input(s): VITAMINB12, FOLATE, FERRITIN, TIBC, IRON, RETICCTPCT in the last 72 hours. Sepsis Labs: No results for input(s): PROCALCITON, LATICACIDVEN in the last 168 hours.  Recent Results (from the past 240 hour(s))  Respiratory Panel by RT PCR (Flu A&B, Covid) - Nasopharyngeal Swab     Status: None   Collection Time: 12/31/19  5:23 PM   Specimen: Nasopharyngeal Swab  Result Value Ref Range Status   SARS Coronavirus 2 by RT PCR NEGATIVE NEGATIVE Final    Comment: (NOTE) SARS-CoV-2 target nucleic acids are NOT DETECTED. The SARS-CoV-2 RNA is generally detectable in upper respiratoy specimens during the acute phase of infection. The  lowest concentration of SARS-CoV-2 viral copies this assay can detect is 131 copies/mL. A negative result does not preclude SARS-Cov-2 infection and should not be used as the sole basis for treatment or other patient management decisions. A negative result may occur with  improper specimen collection/handling, submission of specimen other than nasopharyngeal swab, presence of viral mutation(s) within the areas targeted by this assay, and inadequate number of viral copies (<131 copies/mL). A negative result must be combined with clinical observations, patient history, and epidemiological information. The expected result is Negative. Fact Sheet for Patients:  PinkCheek.be Fact Sheet for Healthcare Providers:  GravelBags.it This test is not yet ap proved or cleared by the [place] FDA and  has been authorized for detection and/or diagnosis of SARS-CoV-2 by FDA under an Emergency Use Authorization (EUA). This EUA will remain  in effect (meaning this test can be used) for the duration of the COVID-19 declaration under Section 564(b)(1) of the Act, 21 U.S.C. section 360bbb-3(b)(1), unless the authorization is terminated or revoked sooner.    Influenza A by PCR NEGATIVE NEGATIVE Final   Influenza B by PCR NEGATIVE NEGATIVE  Final    Comment: (NOTE) The Xpert Xpress SARS-CoV-2/FLU/RSV assay is intended as an aid in  the diagnosis of influenza from Nasopharyngeal swab specimens and  should not be used as a sole basis for treatment. Nasal washings and  aspirates are unacceptable for Xpert Xpress SARS-CoV-2/FLU/RSV  testing. Fact Sheet for Patients: PinkCheek.be Fact Sheet for Healthcare Providers: GravelBags.it This test is not yet approved or cleared by the [place] FDA and  has been authorized for detection and/or diagnosis of SARS-CoV-2 by  FDA under an Emergency  Use Authorization (EUA). This EUA will remain  in effect (meaning this test can be used) for the duration of the  Covid-19 declaration under Section 564(b)(1) of the Act, 21  U.S.C. section 360bbb-3(b)(1), unless the authorization is  terminated or revoked. Performed at [facility], [place] [address], [place], [place] [address]   Surgical pcr screen     Status: Abnormal   Collection Time: 01/01/20  4:27 AM   Specimen: Nasal Mucosa; Nasal Swab  Result Value Ref Range Status   MRSA, PCR NEGATIVE NEGATIVE Final   Staphylococcus aureus POSITIVE (A) NEGATIVE Final    Comment: (NOTE) The Xpert SA Assay (FDA approved for NASAL specimens in patients 39 years of age and older), is one component of a comprehensive surveillance program. It is not intended to diagnose infection nor to guide or monitor treatment. Performed at [facility], [place] [address] [place], [place] [address]   Culture, blood (routine x 2)     Status: None (Preliminary result)   Collection Time: 01/06/20 10:55 AM   Specimen: BLOOD  Result Value Ref Range Status   Specimen Description BLOOD RIGHT ANTECUBITAL  Final   Special Requests   Final    AEROBIC BOTTLE ONLY Blood Culture results may not be optimal due to an inadequate volume of blood received in culture bottles   Culture   Final    NO GROWTH < 24 HOURS Performed at [facility], [place] [address] [place], [place] [address]    Report Status PENDING  Incomplete  Culture, blood (routine x 2)     Status: None (Preliminary result)   Collection Time: 01/06/20 12:17 PM   Specimen: BLOOD  Result Value Ref Range Status   Specimen Description BLOOD RIGHT ANTECUBITAL  Final   Special Requests   Final    AEROBIC BOTTLE ONLY Blood Culture results may not be optimal due to an inadequate volume of blood received in culture bottles   Culture   Final    NO GROWTH < 24 HOURS Performed at [facility], [place] [address], [place], [place] [address]    Report  Status PENDING  Incomplete      Radiology Studies: CT HEAD CODE STROKE WO CONTRAST  Result Date: 01/06/2020 CLINICAL DATA:  Code stroke. Acute onset of mental status change. Left-sided weakness and left facial droop. EXAM: CT HEAD WITHOUT CONTRAST TECHNIQUE: Contiguous axial images were obtained from the base of the skull through the vertex without intravenous contrast. COMPARISON:  MR head 11/14/2019 FINDINGS: Brain: An area of faint hypoattenuation is present in the subcortical white matter of the right frontal operculum. There is possible involvement of the cortex. No other focal cortical lesions are present. Remote lacunar infarcts of the basal ganglia and thalami are stable bilaterally. White matter changes of the brainstem are stable. No other acute cortical infarct is present. The ventricles are of normal size. No significant extraaxial fluid collection is  present. A remote lacunar infarct of the right cerebellum is again noted. Vascular: Atherosclerotic changes are present within the cavernous internal carotid arteries bilaterally. No hyperdense vessel is present. Skull: Calvarium is intact. No focal lytic or blastic lesions are present. No significant extracranial soft tissue lesion is present. Sinuses/Orbits: The paranasal sinuses and mastoid air cells are clear. The globes and orbits are within normal limits. ASPECTS ([place] Stroke Program Early CT Score) - Ganglionic level infarction (caudate, lentiform nuclei, internal capsule, insula, M1-M3 cortex): 7/7 - Supraganglionic infarction (M4-M6 cortex): 2/3 Total score (0-10 with 10 being normal): 9/10 IMPRESSION: 1. New area of hypoattenuation in the subcortical white matter with probable extension to the cortex in the right frontal operculum. 2. No other acute cortical infarct. 3. Remote lacunar infarcts of the basal ganglia, thalami, and right cerebellum. 4. ASPECTS is 9/10 The above was relayed via text pager to Dr. Leonel Ramsay on 01/06/2020 at  07:29 . Electronically Signed   By: San Morelle M.D.   On: 01/06/2020 07:29     LOS: 7 days   Time spent: More than 50% of that time was spent in counseling and/or coordination of care.  Antonieta Pert, MD [facility]  01/07/2020, 7:41 AM

## 2020-01-07 NOTE — Progress Notes (Signed)
EEG complete - results pending 

## 2020-01-08 DIAGNOSIS — L8990 Pressure ulcer of unspecified site, unspecified stage: Secondary | ICD-10-CM | POA: Insufficient documentation

## 2020-01-08 DIAGNOSIS — R4182 Altered mental status, unspecified: Secondary | ICD-10-CM

## 2020-01-08 LAB — RENAL FUNCTION PANEL
Albumin: 1.4 g/dL — ABNORMAL LOW (ref 3.5–5.0)
Anion gap: 19 — ABNORMAL HIGH (ref 5–15)
BUN: 35 mg/dL — ABNORMAL HIGH (ref 6–20)
CO2: 19 mmol/L — ABNORMAL LOW (ref 22–32)
Calcium: 7.7 mg/dL — ABNORMAL LOW (ref 8.9–10.3)
Chloride: 97 mmol/L — ABNORMAL LOW (ref 98–111)
Creatinine, Ser: 4.8 mg/dL — ABNORMAL HIGH (ref 0.61–1.24)
GFR calc Af Amer: 15 mL/min — ABNORMAL LOW (ref >60)
GFR calc non Af Amer: 13 mL/min — ABNORMAL LOW (ref >60)
Glucose, Bld: 216 mg/dL — ABNORMAL HIGH (ref 70–99)
Phosphorus: 5.7 mg/dL — ABNORMAL HIGH (ref 2.5–4.6)
Potassium: 4.5 mmol/L (ref 3.5–5.1)
Sodium: 135 mmol/L (ref 135–145)

## 2020-01-08 LAB — LIPID PANEL
Cholesterol: 76 mg/dL (ref 0–200)
HDL: 18 mg/dL — ABNORMAL LOW (ref >40)
LDL Cholesterol: 28 mg/dL (ref 0–99)
Total CHOL/HDL Ratio: 4.2 RATIO
Triglycerides: 151 mg/dL — ABNORMAL HIGH (ref <150)
VLDL: 30 mg/dL (ref 0–40)

## 2020-01-08 LAB — GLUCOSE, CAPILLARY
Glucose-Capillary: 147 mg/dL — ABNORMAL HIGH (ref 70–99)
Glucose-Capillary: 148 mg/dL — ABNORMAL HIGH (ref 70–99)
Glucose-Capillary: 157 mg/dL — ABNORMAL HIGH (ref 70–99)
Glucose-Capillary: 168 mg/dL — ABNORMAL HIGH (ref 70–99)
Glucose-Capillary: 179 mg/dL — ABNORMAL HIGH (ref 70–99)
Glucose-Capillary: 194 mg/dL — ABNORMAL HIGH (ref 70–99)

## 2020-01-08 LAB — CBC
HCT: 24.9 % — ABNORMAL LOW (ref 39.0–52.0)
Hemoglobin: 7.7 g/dL — ABNORMAL LOW (ref 13.0–17.0)
MCH: 28.3 pg (ref 26.0–34.0)
MCHC: 30.9 g/dL (ref 30.0–36.0)
MCV: 91.5 fL (ref 80.0–100.0)
Platelets: 524 10*3/uL — ABNORMAL HIGH (ref 150–400)
RBC: 2.72 MIL/uL — ABNORMAL LOW (ref 4.22–5.81)
RDW: 14.8 % (ref 11.5–15.5)
WBC: 33.5 10*3/uL — ABNORMAL HIGH (ref 4.0–10.5)
nRBC: 0.1 % (ref 0.0–0.2)

## 2020-01-08 MED ORDER — ASPIRIN 300 MG RE SUPP
300.0000 mg | Freq: Every day | RECTAL | Status: DC
  Administered 2020-01-08 – 2020-01-09 (×2): 300 mg via RECTAL
  Filled 2020-01-08: qty 1

## 2020-01-08 MED ORDER — RENA-VITE PO TABS: 1.0000 | ORAL_TABLET | Freq: Every day | ORAL | Status: DC

## 2020-01-08 NOTE — Progress Notes (Signed)
STROKE TEAM PROGRESS NOTE   INTERVAL HISTORY His family is not at bedside. Patient with confusion since last stroke Jan 2021, progressive mental and physical decline. Not following commands this morning but moving all extremities.  Vitals:   01/08/20 0800 01/08/20 0900 01/08/20 1000 01/08/20 1100  BP: (!) 126/59 128/61 (!) 141/61 138/60  Pulse: (!) 53   (!) 103  Resp: 16 17 16 14   Temp:    99 F (37.2 C)  TempSrc:    Oral  SpO2: 94%   96%  Weight:      Height:        CBC:  Recent Labs  Lab 01/07/20 0412 01/08/20 0228  WBC 22.8* 33.5*  HGB 7.7* 7.7*  HCT 25.7* 24.9*  MCV 92.4 91.5  PLT 529* 524*    Basic Metabolic Panel:  Recent Labs  Lab 01/07/20 0412 01/08/20 0228  NA 139 135  K 4.5 4.5  CL 97* 97*  CO2 22 19*  GLUCOSE 139* 216*  BUN 43* 35*  CREATININE 6.27* 4.80*  CALCIUM 8.2* 7.7*  PHOS 5.1* 5.7*   Lipid Panel:     Component Value Date/Time   CHOL 76 01/08/2020 0228   TRIG 151 (H) 01/08/2020 0228   HDL 18 (L) 01/08/2020 0228   CHOLHDL 4.2 01/08/2020 0228   VLDL 30 01/08/2020 0228   LDLCALC 28 01/08/2020 0228   HgbA1c:  Lab Results  Component Value Date   HGBA1C 7.5 (H) 01/07/2020   Urine Drug Screen:     Component Value Date/Time   LABOPIA NONE DETECTED 09/30/2019 1009   COCAINSCRNUR NONE DETECTED 09/30/2019 1009   LABBENZ NONE DETECTED 09/30/2019 1009   AMPHETMU NONE DETECTED 09/30/2019 1009   THCU NONE DETECTED 09/30/2019 1009   LABBARB NONE DETECTED 09/30/2019 1009    Alcohol Level     Component Value Date/Time   ETH <10 09/30/2019 1000    IMAGING past 24 hours MR BRAIN WO CONTRAST  Result Date: 01/07/2020 CLINICAL DATA:  Left-sided weakness, code stroke follow-up EXAM: MRI HEAD WITHOUT CONTRAST TECHNIQUE: Multiplanar, multiecho pulse sequences of the brain and surrounding structures were obtained without intravenous contrast. COMPARISON:  11/14/2019 FINDINGS: Brain: There are numerous small foci of restricted diffusion involving  multiple vascular territories bilaterally. Multiple chronic small infarcts are identified including involvement of the central white matter, basal ganglia, thalamus, and cerebellum. There is wallerian degeneration along the right cerebral peduncle. Foci of susceptibility likely reflecting chronic blood products are again identified in association with several of these chronic infarcts. There is no intracranial mass, mass effect, or edema. Ventricles are stable in size. There is no hydrocephalus or extra-axial fluid collection. Vascular: Major vessel flow voids at the skull base are preserved. Skull and upper cervical spine: Normal marrow signal is preserved. Sinuses/Orbits: Paranasal sinuses are aerated. Orbits are unremarkable. Other: Sella is unremarkable. Mastoid air cells are clear. Right posterior scalp lipoma. IMPRESSION: Numerous small acute infarctions involving bilateral anterior and posterior circulations. Multiple chronic infarcts. Electronically Signed   By: Macy Mis M.D.   On: 01/07/2020 15:13   EEG adult  Result Date: 01/08/2020 Lora Havens, MD     01/08/2020 12:15 PM Patient Name: Phillip Camacho MRN: [id number] Epilepsy Attending: Lora Havens Referring Physician/Provider: Dr. Amie Portland Date: 01/07/2020 Duration: 22.52 minutes Patient history: 49 year old male with renal failure on hemodialysis with worsening encephalopathy.  EEG to evaluate for seizures. Level of alertness: Lethargic AEDs during EEG study: None Technical aspects: This EEG study was done  with scalp electrodes positioned according to the 10-20 International system of electrode placement. Electrical activity was acquired at a sampling rate of 500Hz  and reviewed with a high frequency filter of 70Hz  and a low frequency filter of 1Hz . EEG data were recorded continuously and digitally stored. Description: No clear posterior dominant was seen.  EEG showed continuous generalized polymorphic 3 to 6 Hz theta-delta slowing.   Hyperventilation photic summation were not performed. Abnormality - Continuous slow, generalized. IMPRESSION: This study is suggestive of moderate to severe diffuse encephalopathy, nonspecific etiology. No seizures or definite epileptiform discharges were seen throughout the recording. Priyanka Barbra Sarks    PHYSICAL EXAM This is a malnourished middle-aged gentleman in no acute distress who is laying in bed intermittently pulling the covers over his head.  Patient is awake but does not follow commands. Eyes conjugate and midline, EOM appear intact,  Blind in right eye, could not assess pupils due to noncooperation,  Face appears symmetric,he is non verbal but he does appear to be able to hear me,  he is moving all his extremities spontaneously and purposefully, he does withdraw to stim x4, no tremor or abnormal movements noted, difficult exam due to noncooperation and encephalopathy.  ASSESSMENT/PLAN [name] is a 49 y.o. 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   Stroke: embolic secondary to unknown source possibly septic emboli also bilateral severe intracranial stenosis and hx of ulcerated aortic plaque Code Stroke CT head new hypodensity subcortical white matter and right frontal operculum, remote infarcts, ASPECTS 9/10.    MRI of the brain (01/07/2020): Bilateral small acute infarcts in multiple vascular territories  MRA head(11/15/2019) - Moderate intracranial atherosclerotic disease including bilateral terminal ICAs and MCAs  MRA neck: negative(11/15/2019)  EEG with diffuse encephalopathy  Repeat TTE and TEE: pending  LDL  28  HgbA1c 7.5  Heparin for VTE prophylaxis    Diet   Diet NPO time specified    Brilinta and asa (not on plavix due to high P2Y12 level = 232) prior to admission, now on ASA and Brilinta. May consider empiric anticoagulation given multiple recurrent cryptogenic embolic infarcts.  Therapy recommendations:  pending  Disposition:  pending   Aortic large ulcerated plaque  07/2019 TEE showed moderate, protruding and ulcerated plaque involving the transverse aorta. Maximum plaque thickness is 5 mm. Mobile plaque/thrombus is not identified.  Has been on DAPT treatment  MRA Chest 1/25 - no thoracic aortic aneurysm or dissection or ulcerated plaque.  History of stroke  07/2019 right small cerebellar and bilateral MCA/PCA, right PCA, right thalamic punctate infarcts.  MRA age advanced atherosclerosis, right ICA siphon 50% stenosis, right P2 moderate and P3 high-grade stenosis.  MRA negative.  LE venous Doppler no DVT.  EF 65 to 70%.  TEE showed ulcerated plaque in aortic arch.  Loop recorder placed.  LDL 248 and A1c 11.6.  Patient put on DAPT for 3 months as well as Lipitor 80.  09/2019 admitted for right CRAO, MRI also found new small right frontal lobe, right occipital and left temporal lobe infarcts.  MRA again multifocal severe intracranial stenosis.  Carotid Doppler negative.  Loop recorder no A. fib.  LDL 133 and A1c 9.8.  Patient was asked not to drive.  10/2019: Punctate  acute to early subacute infarcts in the left thalamus, subacute left frontal and right MCA/PCA infarcts - embolic pattern - likely due to bilaterally severe intracranial stenosis, aortic ulcerated plaque and hypercoagulable stated from uncontrolled DM. Changed to ASA and Brilinta. Loop recorder interrogated and no afib.Hypercoagulable panel completed and negative.   Uncontrolled diabetes (but improved)  HgbA1c 7.5, goal < 7.0  Close PCP follow-up for DM control   Tobacco abuse  Current smoker  Smoking  cessation counseling to be provided  Hypertension   Long-term BP goal 130-150 given multifocal intracranial stenosis  Hyperlipidemia  Home Lipid lowering medication: Lipitor 80 mg daily   LDL 28, goal < 70  Current lipid lowering medication: Lipitor 80 mg daily  Zetia added last admission  Continue statin and Zetia at discharge   Other Active Problems   Sepsis, blood cultures pending, on broad-spectrum antibiotics  Anemia of chronic disease, stable  Critical ischemia of left lower extremity  Multifactorial Acute Encephalopathy  Renal failure on dialysis  Body mass index 19.59, malnutrition   Hospital day # 8  Personally examined patient and images, and have participated in and made any corrections needed to history, physical, neuro  exam,assessment and plan as stated above.  I have personally obtained the history, evaluated lab date, reviewed imaging studies and agree with radiology interpretations.    Sarina Ill, MD Stroke Neurology   A total of 35 minutes was spent for the care of this patient, spent on counseling patient and family on different diagnostic and therapeutic options, counseling and coordination of care, riskd ans benefits of management, compliance, or risk factor reduction and education.   To contact Stroke Continuity provider, please refer to [email or web address] After hours, contact General Neurology

## 2020-01-08 NOTE — Evaluation (Signed)
Clinical/Bedside Swallow Evaluation Patient Details  Name: Phillip Camacho MRN: [id number] Date of Birth: [birth year]  Today's Date: 01/08/2020 Time: SLP Start Time (ACUTE ONLY): 0160 SLP Stop Time (ACUTE ONLY): 1431 SLP Time Calculation (min) (ACUTE ONLY): 16 min  Past Medical History:  Past Medical History:  Diagnosis Date  . CKD stage 4 due to type 2 diabetes mellitus (Phillip Camacho) 08/11/2019  . Diabetes mellitus without complication ([place])   . Hyperlipidemia   . Nephrotic syndrome in diabetes mellitus ([place]) 09/17/2019  . PAD (peripheral artery disease) ([place]) 12/31/2019  . Stroke [facility])    Past Surgical History:  Past Surgical History:  Procedure Laterality Date  . AV FISTULA PLACEMENT Left 01/05/2020   Procedure: LEFT ARTERIOVENOUS (AV) FISTULA CREATION;  Surgeon: [name], MD;  Location: [place];  Service: Vascular;  Laterality: Left;  . BUBBLE STUDY  08/13/2019   Procedure: BUBBLE STUDY;  Surgeon: [name], MD;  Location: [place] ENDOSCOPY;  Service: Cardiovascular;;  . FEMORAL-POPLITEAL BYPASS GRAFT N/A 01/01/2020   Procedure: LEFT LEG  FEMORAL TO BELOW KNEE POPLITEAL BYPASS;  Surgeon: [name], MD;  Location: [place];  Service: Vascular;  Laterality: N/A;  . INSERTION OF DIALYSIS CATHETER Right 01/02/2020   Procedure: INSERTION OF DIALYSIS CATHETER Right Internal Jugular;  Surgeon: Marty Heck, MD;  Location: [place];  Service: Vascular;  Laterality: Right;  . LOOP RECORDER INSERTION N/A 08/13/2019   Procedure: LOOP RECORDER INSERTION;  Surgeon: [name], MD;  Location: [place] CV LAB;  Service: Cardiovascular;  Laterality: N/A;  . TEE WITHOUT CARDIOVERSION N/A 08/13/2019   Procedure: TRANSESOPHAGEAL ECHOCARDIOGRAM (TEE);  Surgeon: [name], MD;  Location: [facility] ENDOSCOPY;  Service: Cardiovascular;  Laterality: N/A;  PATIENT NEEDS LOOP   HPI:  Pt is a 49 y.o. male with medical history significant for insulin-dependent diabetes mellitus, hypertension, chronic  kidney disease stage IV, and recurrent CVAs, likely embolic, now presenting to the emergency department with left foot pain. MRI of the brain: Numerous small acute infarctions involving bilateral anterior and posterior circulations. EEG: moderate to severe diffuse encephalopathy of non-specific etiology.   Assessment / Plan / Recommendation Clinical Impression  Pt was seen for bedside swallow evaluation with his son present. [employer] [place] interpreter (ID# [phone]) was used for translation and interpretation. However, pt's son reported that the pt understands a lot of [place] and he communicated with him in both [place] and [place]. Pt required max encouragement from this SLP and his son to participate and even with this level of support his participation was limited. A complete oral mechanism exam could not be conducted due to pt's inability/refusal to follow commands. He accepted minimal quantities of puree without clear evidence of swallowing. Bolus awareness was reduced and he exhibited a delayed cough with thin liquids via straw, suggesting possible aspiration. It is recommended that the pt's NPO status be maintained at this time. SLP will follow pt to assess improvement in swallow function and his ability to safely tolerate a p.o. diet. SLP Visit Diagnosis: Dysphagia, oropharyngeal phase (R13.12)    Aspiration Risk  Moderate aspiration risk    Diet Recommendation NPO;Alternative means - temporary   Medication Administration: Via alternative means    Other  Recommendations Oral Care Recommendations: Oral care QID   Follow up Recommendations Other (comment)(TBD)      Frequency and Duration min 2x/week  2 weeks       Prognosis Prognosis for Safe Diet Advancement: Fair Barriers to Reach Goals: Severity of deficits;Cognitive deficits;Motivation  Swallow Study   General Date of Phillip Camacho: 01/07/20 HPI: Pt is a 49 y.o. male with medical history significant for insulin-dependent  diabetes mellitus, hypertension, chronic kidney disease stage IV, and recurrent CVAs, likely embolic, now presenting to the emergency department with left foot pain. MRI of the brain: Numerous small acute infarctions involving bilateral anterior and posterior circulations. EEG: moderate to severe diffuse encephalopathy of non-specific etiology. Type of Study: Bedside Swallow Evaluation Previous Swallow Assessment: None Diet Prior to this Study: NPO Temperature Spikes Noted: No Respiratory Status: Room air History of Recent Intubation: No Behavior/Cognition: Alert;Confused;Doesn't follow directions;Requires cueing Oral Cavity Assessment: Other (comment)(Pt refused to allow assessment ) Oral Care Completed by SLP: No(Pt refused) Oral Cavity - Dentition: Dentures, top Vision: Functional for self-feeding Self-Feeding Abilities: Total assist Patient Positioning: Upright in bed;Postural control adequate for testing Baseline Vocal Quality: Other (comment)(UTA) Volitional Cough: Cognitively unable to elicit Volitional Swallow: Unable to elicit    Oral/Motor/Sensory Function Overall Oral Motor/Sensory Function: Other (comment)(UTA due to pt's participation)   [employer] chips: Not tested Other Comments: Pt refused ice chips presented   Thin Liquid Thin Liquid: Impaired Presentation: Straw Oral Phase Impairments: Reduced labial seal Oral Phase Functional Implications: Oral holding Pharyngeal  Phase Impairments: Cough - Delayed    Nectar Thick Nectar Thick Liquid: Not tested   Honey Thick Honey Thick Liquid: Not tested   Puree Puree: Impaired Presentation: Spoon Oral Phase Impairments: Poor awareness of bolus Oral Phase Functional Implications: Prolonged oral transit   Solid     Solid: Not tested     Phillip Camacho I. Phillip Camacho, Phillip Camacho, Phillip Camacho Office number [phone] Pager [phone]  Phillip Camacho 01/08/2020,3:08 PM

## 2020-01-08 NOTE — Progress Notes (Signed)
Initial Nutrition Assessment  DOCUMENTATION CODES:   Not applicable  INTERVENTION:   Once Cortrak placed:  -Nepro @ 20 ml/hr -Increase by 10 ml Q4 hours to goal rate of 40 ml/hr (960 ml) -30 ml Prostat BID -Renal MVI daily  At goal TF provides: 1928 kcals, 110 grams protein, 698 ml free water.   NUTRITION DIAGNOSIS:   Increased nutrient needs related to post-op healing as evidenced by estimated needs.  GOAL:   Patient will meet greater than or equal to 90% of their needs  MONITOR:   PO intake, Supplement acceptance, Weight trends, Labs, I & O's  REASON FOR ASSESSMENT:   LOS    ASSESSMENT:   Patient with PMH significant for DM, HTN, CKD stage IV, and recurrent CVAs. Presents this admission with ischemic of left lower extremity.   3/11- s/p L fem-pop bypass 3/12- s/p R TDC, first HD 3/15- s/p L AVF creation  RD working remotely.   Unable to reach pt by phone. Meal completion charted as 10% for pt's last meal (no further documentation). Plan Cortrak placement tomorrow. Titrate to goal. Pt at risk for refeeding.   Records indicate pt weighed 67.1 kg on 10/01/2019 and 52.6 kg this admission. Unable to determine dry wt loss vs fluid fluctuations. Will need to obtain nutrition history, weight history, and NFPE. Suspect pt has some form of malnutrition but unable to diagnose at this time.   I/O: +335 ml since admit  UOP: none documented  Last HD yesterday: 520 ml net UF   Medications: phoslo, aranesp, SS novolog Labs: Cr 4.80- trending corrected calcium 9.8 (wdl) CBG 146-216   Diet Order:   Diet Order            Diet NPO time specified  Diet effective now              EDUCATION NEEDS:   Not appropriate for education at this time  Skin:  Skin Assessment: Skin Integrity Issues: Skin Integrity Issues:: Stage II, Other (Comment) Stage II: sacrum Other: L leg, chest, L arm  Last BM:  3/18  Height:   Ht Readings from Last 1 Encounters:  01/02/20 5\' 5"   (1.651 m)    Weight:   Wt Readings from Last 1 Encounters:  01/08/20 53.4 kg    BMI:  Body mass index is 19.59 kg/m.  Estimated Nutritional Needs:   Kcal:  1750-1950 kcal  Protein:  90-115 gram  Fluid:  1000 ml + UOP   Mariana Single RD, LDN Clinical Nutrition Pager listed in [place]

## 2020-01-08 NOTE — Procedures (Signed)
Patient Name: Phillip Camacho  MRN: [id number]  Epilepsy Attending: Lora Havens  Referring Physician/Provider: Dr. Amie Portland Date: 01/07/2020 Duration: 22.52 minutes  Patient history: 49 year old male with renal failure on hemodialysis with worsening encephalopathy.  EEG to evaluate for seizures.  Level of alertness: Lethargic  AEDs during EEG study: None  Technical aspects: This EEG study was done with scalp electrodes positioned according to the 10-20 International system of electrode placement. Electrical activity was acquired at a sampling rate of 500Hz  and reviewed with a high frequency filter of 70Hz  and a low frequency filter of 1Hz . EEG data were recorded continuously and digitally stored.   Description: No clear posterior dominant was seen.  EEG showed continuous generalized polymorphic 3 to 6 Hz theta-delta slowing.  Hyperventilation photic summation were not performed.  Abnormality - Continuous slow, generalized.   IMPRESSION: This study is suggestive of moderate to severe diffuse encephalopathy, nonspecific etiology. No seizures or definite epileptiform discharges were seen throughout the recording.    Zykee Avakian Barbra Sarks

## 2020-01-08 NOTE — Progress Notes (Addendum)
[facility]  Subjective  - Does not communicate and keeps blanket over his head.   Objective 123/75 100 98.6 F (37 C) (Oral) 20 96%  Intake/Output Summary (Last 24 hours) at 01/08/2020 0716 Last data filed at 01/07/2020 1018 Gross per 24 hour  Intake --  Output 520 ml  Net -520 ml    Left foot demarcating, doppler signal AT/peroneal intact No edema, all incisions healing well with soft groin      +-----+---------------+---------+-----------+----------+--------------+  RIGHTCompressibilityPhasicitySpontaneityPropertiesThrombus Aging  +-----+---------------+---------+-----------+----------+--------------+  CFV Full      Yes   Yes                  +-----+---------------+---------+-----------+----------+--------------+         +---------+---------------+---------+-----------+----------+---------------  ----+  LEFT   CompressibilityPhasicitySpontaneityPropertiesThrombus Aging     +---------+---------------+---------+-----------+----------+---------------  ----+  CFV   Full      No    Yes                     +---------+---------------+---------+-----------+----------+---------------  ----+  SFJ                           Harvest for  bypass?  +---------+---------------+---------+-----------+----------+---------------  ----+  FV Prox Full                                  +---------+---------------+---------+-----------+----------+---------------  ----+  FV Mid  Full                                  +---------+---------------+---------+-----------+----------+---------------  ----+  FV DistalFull                                  +---------+---------------+---------+-----------+----------+---------------   ----+  PFV   Full                                  +---------+---------------+---------+-----------+----------+---------------  ----+  POP   Full      No    Yes                     +---------+---------------+---------+-----------+----------+---------------  ----+  PTV   Full                      poorly  visualized   +---------+---------------+---------+-----------+----------+---------------  ----+  PERO   Full                      poorly  visualized   +---------+---------------+---------+-----------+----------+---------------  ----+   Multiple hypoechoic, oval structures with echogenic centers seen in left  groin, likely lymph nodes. Largest measuring 2.4 x 1.1 x 1.4 cm. Anechoic  structure with no blood flow seen in left groin, hematoma vs seroma,  measures 3.4 x 1.3 x 2.2 cm.       Summary:  RIGHT:  - No evidence of common femoral vein obstruction.    LEFT:  - There is no evidence of deep vein thrombosis in the lower extremity. All  veins examined are compressible, however there appears to be continuous  flow in CFV and popliteal vein. Portions of this examination were limited-  see technologist comments above.    -  No cystic structure found in the popliteal fossa.    - Multiple hypoechoic, oval structures with echogenic centers seen in left  groin, likely lymph nodes. Largest measuring 2.4 x 1.1 x 1.4 cm. Anechoic  structure with no blood flow seen in left groin, hematoma vs seroma,  measures 3.4 x 1.3 x 2.2 cm.    EXAM: MRI HEAD WITHOUT CONTRAST  TECHNIQUE: Multiplanar, multiecho pulse sequences of the brain and surrounding structures were obtained without intravenous contrast.  COMPARISON:  11/14/2019  FINDINGS: Brain: There are numerous small foci of restricted diffusion involving multiple vascular territories  bilaterally.  Multiple chronic small infarcts are identified including involvement of the central white matter, basal ganglia, thalamus, and cerebellum. There is wallerian degeneration along the right cerebral peduncle. Foci of susceptibility likely reflecting chronic blood products are again identified in association with several of these chronic infarcts.  There is no intracranial mass, mass effect, or edema. Ventricles are stable in size. There is no hydrocephalus or extra-axial fluid collection.  Vascular: Major vessel flow voids at the skull base are preserved.  Skull and upper cervical spine: Normal marrow signal is preserved.  Sinuses/Orbits: Paranasal sinuses are aerated. Orbits are unremarkable.  Other: Sella is unremarkable. Mastoid air cells are clear. Right posterior scalp lipoma.  IMPRESSION: Numerous small acute infarctions involving bilateral anterior and posterior circulations.  Multiple chronic   Assessment/Planning: 49 y.o.maleis s/p L brachiocephalic fistula creation3 Day Post-Op L femoral to popliteal bypass with vein 7 days post op  Blood cultures pending started on IV antibiotics doxycycline, vanc and cefepime. Chest x ray  IMPRESSION: 1. Low lung volumes without radiographic evidence of acute cardiopulmonary disease.  F/u MRI reveals numerous small infarcts. He is afebrile, but WBC elevated to 33.  Source unknown at this time.   Roxy Horseman 01/08/2020 7:16 AM --  Laboratory Lab Results: Recent Labs    01/07/20 0412 01/08/20 0228  WBC 22.8* 33.5*  HGB 7.7* 7.7*  HCT 25.7* 24.9*  PLT 529* 524*   BMET Recent Labs    01/07/20 0412 01/08/20 0228  NA 139 135  K 4.5 4.5  CL 97* 97*  CO2 22 19*  GLUCOSE 139* 216*  BUN 43* 35*  CREATININE 6.27* 4.80*  CALCIUM 8.2* 7.7*    COAG Lab Results  Component Value Date   INR 1.0 01/05/2020   INR 1.0 12/31/2019   INR 0.9 11/14/2019   No results found for:  PTT   I have examined the patient, reviewed and agree with above.  Patient less responsive today.  Left surgical incisions all healing.  2-3+ left popliteal graft pulse.  Foot demarcating from preoperative profound ischemia.  Will at least end up with distal partial foot amputation.  Overall deterioration with multiple issues from new strokes by MR I, end-stage renal disease.  Question need for palliative care consult for goals of care.  Curt Jews, MD 01/08/2020 12:51 PM

## 2020-01-08 NOTE — Progress Notes (Signed)
PROGRESS NOTE    Phillip Camacho  WIO:[id number] DOB: [birth year] DOA: 12/31/2019 PCP: [name], MD   Brief Narrative: As per admission HPI: 49 y.o. 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  Last hospitalized in January 2021 for a stroke since then has been confused, not been eating or drinking, needing supervision with ADLs. In the ED found to have potassium 6.6, bicarbonate 17, BUN 50, and creatinine 5.69, up from 4.8 in January 2021.  CBC notable for leukocytosis to 14,500, stable normocytic anemia, and increased thrombocytosis with platelets of 748,000.  INR is normal.  Covid and influenza PCR are negative.    Patient received temporizing measures with insulin/dextrose, bicarbonate, and Lokelma, nephrology/vascular surgery consulted, patient was admitted for critical left leg ischemia and hyperkalemia and renal failure. 3/11-left femoral to below-knee popliteal bypass.  Patient seen by nephrology and also started on dialysis after placing TDC.  Patient had episode of some weakness and had CT scan that showed abnormal hypoattenuation and underwent MRI and during that time he has been more confused intermittently febrile.  MRI came back with bilateral multiple strokes.  Followed by neurology.  Subjective: Seen this morning.  Patient appears confused try to cover his face with a blanket.  Does not follow command.  Moving all his extremities spontaneously. He is on room air. No fever overnight.  Last temperature spike 102.7 3/17 3.33 pm. Heart rate better, saturating well on room air blood pressure stable WBC further up at 22.8->33.5k  Assessment & Plan:  Progressive CKD stage 4 due to poorly controlled diabetes and hypertension: nephro on board, work-up negative so far with negative HIV, hep C, hep B.  Holding home losartan.  status post tunnel dialysis catheter placement and initiation of dialysis 3/12, AV fistula placed 3/15. Cont HD per nephro and will need clip and skilled nursing facility placement.  Recent Labs  Lab 01/04/20 0153 01/05/20 0235 01/06/20 0133 01/07/20 0412 01/08/20 0228  BUN 27* 31* 19 43* 35*  CREATININE 4.06* 4.72* 3.77* 6.27* 4.80*   Critical ischemia of left lower extremity, 3-5 left toes tip blackish:  S/p Left femoral to below-knee popliteal bypass with reverse great saphenous vein 3/11.  Appreciate vascular input, monitor distal pulses.  Continue aspirin, Brilinta and Lipitor- via Cortrak tube once in. Left leg erythematous and  Lt 3-5 toes deep are blackish and getting demarcated being followed by vascular.  No DVT in lower extremities.    Sepsis with fever/acute encephalopathy: Patient had an intermittent temperature spike, COVID-19 - on 3/17.  Chest x-ray 3/17 no acute finding, blood culture from 3/16 and 3./17-no growth so far.  Has been placed on empiric vancomycin/cefepime 3/17- Cont same. He has RT IJ HD line  Acute encephalopathy likely multifactorial, metabolic and in relation to bilateral multiple strokes seen on MRI. he had lethargy with Pupillary asymmetry ( chronic). First noted 3/16 am after bath - code stroke activated 3/16- seen by neuro-CT head area of hypoattenuation in the subcortical white matter with probable extension to the cortex in the right frontal operculum, remote infarcts, seen by neurologist and MRI ordered-unable to be completed at he was moving.  Still appears lethargic this morning-monitor closely.    Multiple acute embolic strokes seen on MRI 3/17.  Patient has been encephalopathic.  Neurology following closely.  Evaluate for septic emboli, plan noted  for TTE and if needed TEE, vascular carotid duplex, EEG.  Continue supportive care, core track ordered  for meds and feeding.  Severe Hyperkalemia: resolved Metabolic acidosis from renal  dysfunction-resolved Nephrotic syndrome presented to diabetic nephropathy.  Monitor.  Insulin-requiring or dependent type II diabetes mellitus, hemoglobin A1c is poorly controlled 8.6 on 1/23.  Blood sugar stable, we will change to every 6 hours sliding scale insulin.   Recent Labs  Lab 01/07/20 1617 01/07/20 2024 01/08/20 0024 01/08/20 0420 01/08/20 0755  GLUCAP 146* 151* 179* 194* 147*   History recurrent CVAs:Patient was admitted in January with recurrent CVA, likely embolic, has had increased weakness on the left and cognitive deficits since then. Cont home Brilinta, aspirin and Lipitor.    Hypertension,controlled.  Transient episode of hypotension 3/17 afternoon, holding his home Norvasc in the setting of sepsis.  Allow permissive hypertension in the setting of acute stroke  HLD:on statins.  Tobacco ZOX:WRUE need to advise cessation.  Anemia of CKD-hemoglobin is stable, transfuse for less than 7 g.    Social issues:Patient lives with his son who works during the day and has had difficulty caring for the patient who has been frequently confused since the recent strokes and is often unable to perform basic ADLs on his own.  Body mass index is 19.59 kg/m.   DVT prophylaxis:Heparin. Code Status:Full.  Family Communication:I have called son and updated-he agrees for skilled nursing facility.  Updated his son's over the phone yesterday.   Disposition Plan: Patient is from:Home Anticipated Disposition:PT recommends SNF.  Son is at work during daytime unable to look after him and he agrees for a skilled nursing facility  For ADL and eye sight isseus. Barriers to discharge or conditions that needs to be met prior to discharge: Patient was admitted with progressive CKD and critical ischemia of left leg status post bypass surgery, and with worsening renal failure now on dialysis, in the interim found to be encephalopathic as well as bilateral embolic strokes, sepsis with fever and  work-up in progress.  Remains hospitalized.  Once he is stable will need SNF/sleep  Consultants: Nephrology, vascular surgery.   Procedures:  3/11: Left femoral to below-knee popliteal bypass with reverse great saphenous vein  Microbiology:see note  Medications: Scheduled Meds:  aspirin EC  81 mg Oral Daily   atorvastatin  80 mg Oral q1800   calcium acetate  667 mg Oral TID WC   Chlorhexidine Gluconate Cloth  6 each Topical Q0600   darbepoetin (ARANESP) injection - DIALYSIS  100 mcg Intravenous Q Mon-HD   heparin  5,000 Units Subcutaneous Q8H   insulin aspart  0-9 Units Subcutaneous Q4H   mupirocin ointment  1 application Nasal BID   sodium chloride flush  3 mL Intravenous Q12H   ticagrelor  90 mg Oral BID   vancomycin  500 mg Intravenous Q M,W,F-HD   Continuous Infusions:  sodium chloride 10 mL/hr at 01/02/20 0916   ceFEPime (MAXIPIME) IV      Antimicrobials: Anti-infectives (From admission, onward)   Start     Dose/Rate Route Frequency Ordered Stop   01/08/20 2000  ceFEPIme (MAXIPIME) 1 g in sodium chloride 0.9 % 100 mL IVPB     1 g 200 mL/hr over 30 Minutes Intravenous Every 24 hours 01/07/20 0858     01/07/20 1200  vancomycin (VANCOCIN) IVPB 500 mg/100 ml premix     500 mg 100 mL/hr over 60 Minutes Intravenous Every M-W-F (Hemodialysis) 01/07/20 0858     01/07/20 1000  vancomycin (VANCOREADY) IVPB 1250 mg/250 mL     1,250 mg 166.7 mL/hr over 90 Minutes Intravenous To Hemodialysis 01/07/20 0856 01/07/20 1058   01/07/20 1000  ceFEPIme (MAXIPIME) 1 g in sodium chloride 0.9 % 100 mL IVPB     1 g 200 mL/hr over 30 Minutes Intravenous  Once 01/07/20 0856 01/07/20 1252   01/06/20 1130  doxycycline (VIBRA-TABS) tablet 100 mg  Status:  Discontinued     100 mg Oral Every 12 hours 01/06/20 1045 01/07/20 0838   01/05/20 0000  ceFAZolin (ANCEF) IVPB 1 g/50 mL premix    Note to Pharmacy: Send with pt to OR   1 g 100 mL/hr over 30 Minutes Intravenous On call 01/04/20  1134 01/05/20 0735   01/02/20 0927  ceFAZolin (ANCEF) 2-4 GM/100ML-% IVPB    Note to Pharmacy: [name]   : cabinet override      01/02/20 0927 01/02/20 0945   01/01/20 1600  ceFAZolin (ANCEF) IVPB 2g/100 mL premix     2 g 200 mL/hr over 30 Minutes Intravenous Every 8 hours 01/01/20 1224 01/02/20 0125   01/01/20 0706  ceFAZolin (ANCEF) 2-4 GM/100ML-% IVPB    Note to Pharmacy: Lelon Perla   : cabinet override      01/01/20 0706 01/01/20 1914   12/31/19 1745  ceFAZolin (ANCEF) IVPB 2g/100 mL premix     2 g 200 mL/hr over 30 Minutes Intravenous On call to O.R. 12/31/19 1730 01/01/20 0559       Objective: Vitals: Today's Vitals   01/08/20 0745 01/08/20 0757 01/08/20 0758 01/08/20 0800  BP: (!) 116/56 (!) 116/56 136/63 (!) 126/59  Pulse:  95 (!) 26 (!) 53  Resp: _0 Temp:  99.6 F (37.6 C)    TempSrc:  Oral    SpO2:  95% 94% 94%  Weight:      Height:      PainSc:       No intake or output data in the 24 hours ending 01/08/20 1109 Filed Weights   01/07/20 0648 01/07/20 1018 01/08/20 0416  Weight: 55.9 kg 54.1 kg 53.4 kg   Weight change: -0.7 kg   Intake/Output from previous day: 03/17 0701 - 03/18 0700 In: -  Out: 520  Intake/Output this shift: No intake/output data recorded.  Examination:  General exam: More alert awake today but confused encephalopathic not following commands moving all his extremities spontaneously.   HEENT:Oral mucosa moist, Ear/Nose WNL grossly,dentition normal. Respiratory system: b/l clear, no crackles, no use of accessory muscles.  _1 Cardiovascular system: S1 & S2 +, regular, No JVD. Gastrointestinal system: Abdomen soft, NT,ND, BS+. Nervous System: more alert awake today but confused encephalopathic not following commands moving all his extremities spontaneously.  Extremities: Mild erythema and edema on the left leg, 3-5 toes with blackish discoloration, sirgical scar intact on left leg. No drainage.   Skin: No rashes,no  icterus. MSK: Normal muscle bulk,tone,    Data Reviewed: I have personally reviewed following labs and imaging studies CBC: Recent Labs  Lab 01/04/20 0153 01/05/20 0235 01/06/20 0831 01/07/20 0412 01/08/20 0228  WBC 14.7* 17.0* 19.1* 22.8* 33.5*  HGB 7.9* 8.1* 8.7* 7.7* 7.7*  HCT 25.7* 25.9* 29.1* 25.7* 24.9*  MCV 89.5 91.8 93.6 92.4 91.5  PLT 479* 531* 610* 529* 161*   Basic Metabolic Panel: Recent Labs  Lab 01/04/20 0153 01/05/20 0235 01/06/20 0133 01/07/20 0412 01/08/20 0228  NA 140 141 140 139 135  K 3.6 4.2 4.3 4.5  4.5  CL 103 106 99 97* 97*  CO2 _0 19*  GLUCOSE 72 103* 88 139* 216*  BUN 27* 31* 19 43* 35*  CREATININE 4.06* 4.72* 3.77* 6.27* 4.80*  CALCIUM 8.4* 8.6* 8.1* 8.2* 7.7*  PHOS 3.3 3.6 4.1 5.1* 5.7*   GFR: Estimated Creatinine Clearance: 14.2 mL/min (A) (by C-G formula based on SCr of 4.8 mg/dL (H)). Liver Function Tests: Recent Labs  Lab 01/04/20 0153 01/05/20 0235 01/06/20 0133 01/07/20 0412 01/08/20 0228  ALBUMIN 1.7* 1.8* 1.7* 1.5* 1.4*   No results for input(s): LIPASE, AMYLASE in the last 168 hours. No results for input(s): AMMONIA in the last 168 hours. Coagulation Profile: Recent Labs  Lab 01/05/20 0235  INR 1.0   Cardiac Enzymes: No results for input(s): CKTOTAL, CKMB, CKMBINDEX, TROPONINI in the last 168 hours. BNP (last 3 results) No results for input(s): PROBNP in the last 8760 hours. HbA1C: Recent Labs    01/07/20 1855  HGBA1C 7.5*   CBG: Recent Labs  Lab 01/07/20 1617 01/07/20 2024 01/08/20 0024 01/08/20 0420 01/08/20 0755  GLUCAP 146* 151* 179* 194* 147*   Lipid Profile: Recent Labs    01/08/20 0228  CHOL 76  HDL 18*  LDLCALC 28  TRIG 151*  CHOLHDL 4.2   Thyroid Function Tests: No results for input(s): TSH, T4TOTAL, FREET4, T3FREE, THYROIDAB in the last 72 hours. Anemia Panel: No results for input(s): VITAMINB12, FOLATE, FERRITIN, TIBC, IRON, RETICCTPCT in the last 72 hours. Sepsis  Labs: Recent Labs  Lab 01/07/20 1603 01/07/20 1855  LATICACIDVEN 1.1 0.9    Recent Results (from the past 240 hour(s))  Respiratory Panel by RT PCR (Flu A&B, Covid) - Nasopharyngeal Swab     Status: None   Collection Time: 12/31/19  5:23 PM   Specimen: Nasopharyngeal Swab  Result Value Ref Range Status   SARS Coronavirus 2 by RT PCR NEGATIVE NEGATIVE Final    Comment: (NOTE) SARS-CoV-2 target nucleic acids are NOT DETECTED. The SARS-CoV-2 RNA is generally detectable in upper respiratoy specimens during the acute phase of infection. The lowest concentration of SARS-CoV-2 viral copies this assay can detect is 131 copies/mL. A negative result does not preclude SARS-Cov-2 infection and should not be used as the sole basis for treatment or other patient management decisions. A negative result may occur with  improper specimen collection/handling, submission of specimen other than nasopharyngeal swab, presence of viral mutation(s) within the areas targeted by this assay, and inadequate number of viral copies (<131 copies/mL). A negative result must be combined with clinical observations, patient history, and epidemiological information. The expected result is Negative. Fact Sheet for Patients:  PinkCheek.be Fact Sheet for Healthcare Providers:  GravelBags.it This test is not yet ap proved or cleared by the [place] FDA and  has been authorized for detection and/or diagnosis of SARS-CoV-2 by FDA under an Emergency Use Authorization (EUA). This EUA will remain  in effect (meaning this test can be used) for the duration of the COVID-19 declaration under Section 564(b)(1) of the Act, 21 U.S.C. section 360bbb-3(b)(1), unless the authorization is terminated or revoked sooner.    Influenza A by PCR NEGATIVE NEGATIVE Final   Influenza B by PCR NEGATIVE NEGATIVE Final    Comment: (NOTE) The Xpert Xpress SARS-CoV-2/FLU/RSV assay  is intended as an aid in  the diagnosis of influenza from Nasopharyngeal swab specimens and  should not be used as a sole basis for treatment. Nasal washings and  aspirates are unacceptable for Xpert Xpress  SARS-CoV-2/FLU/RSV  testing. Fact Sheet for Patients: PinkCheek.be Fact Sheet for Healthcare Providers: GravelBags.it This test is not yet approved or cleared by the [place] FDA and  has been authorized for detection and/or diagnosis of SARS-CoV-2 by  FDA under an Emergency Use Authorization (EUA). This EUA will remain  in effect (meaning this test can be used) for the duration of the  Covid-19 declaration under Section 564(b)(1) of the Act, 21  U.S.C. section 360bbb-3(b)(1), unless the authorization is  terminated or revoked. Performed at [facility], [place] [address], [place], [place] [address]   Surgical pcr screen     Status: Abnormal   Collection Time: 01/01/20  4:27 AM   Specimen: Nasal Mucosa; Nasal Swab  Result Value Ref Range Status   MRSA, PCR NEGATIVE NEGATIVE Final   Staphylococcus aureus POSITIVE (A) NEGATIVE Final    Comment: (NOTE) The Xpert SA Assay (FDA approved for NASAL specimens in patients 48 years of age and older), is one component of a comprehensive surveillance program. It is not intended to diagnose infection nor to guide or monitor treatment. Performed at [facility], [place] [address] [place], [place] [address]   Culture, blood (routine x 2)     Status: None (Preliminary result)   Collection Time: 01/06/20 10:55 AM   Specimen: BLOOD  Result Value Ref Range Status   Specimen Description BLOOD RIGHT ANTECUBITAL  Final   Special Requests   Final    AEROBIC BOTTLE ONLY Blood Culture results may not be optimal due to an inadequate volume of blood received in culture bottles   Culture   Final    NO GROWTH 2 DAYS Performed at [facility], [place] [address] [place],  [place] [address]    Report Status PENDING  Incomplete  Culture, blood (routine x 2)     Status: None (Preliminary result)   Collection Time: 01/06/20 12:17 PM   Specimen: BLOOD  Result Value Ref Range Status   Specimen Description BLOOD RIGHT ANTECUBITAL  Final   Special Requests   Final    AEROBIC BOTTLE ONLY Blood Culture results may not be optimal due to an inadequate volume of blood received in culture bottles   Culture   Final    NO GROWTH 2 DAYS Performed at [facility], [place] [address], [place], [place] [address]    Report Status PENDING  Incomplete  Culture, blood (routine x 2)     Status: None (Preliminary result)   Collection Time: 01/07/20 11:45 AM   Specimen: BLOOD  Result Value Ref Range Status   Specimen Description BLOOD HEMODIALYSIS CATHETER VENOUS  Final   Special Requests   Final    BOTTLES DRAWN AEROBIC ONLY Blood Culture adequate volume   Culture   Final    NO GROWTH < 24 HOURS Performed at [facility], [place] [address] [place], [place] [address]    Report Status PENDING  Incomplete  Culture, blood (routine x 2)     Status: None (Preliminary result)   Collection Time: 01/07/20  4:11 PM   Specimen: BLOOD  Result Value Ref Range Status   Specimen Description BLOOD HEMODIALYSIS CATHETER ARTERIAL  Final   Special Requests   Final    BOTTLES DRAWN AEROBIC AND ANAEROBIC Blood Culture adequate volume   Culture   Final    NO GROWTH < 24 HOURS Performed at [facility], [place] [address] [place], [place] [address]  Report Status PENDING  Incomplete  SARS CORONAVIRUS 2 (TAT 6-24 HRS) Nasopharyngeal Nasopharyngeal Swab     Status: None   Collection Time: 01/07/20  4:49 PM   Specimen: Nasopharyngeal Swab  Result Value Ref Range Status   SARS Coronavirus 2 NEGATIVE NEGATIVE Final    Comment: (NOTE) SARS-CoV-2 target nucleic acids are NOT DETECTED. The SARS-CoV-2 RNA is generally detectable in upper and lower respiratory specimens during the acute phase  of infection. Negative results do not preclude SARS-CoV-2 infection, do not rule out co-infections with other pathogens, and should not be used as the sole basis for treatment or other patient management decisions. Negative results must be combined with clinical observations, patient history, and epidemiological information. The expected result is Negative. Fact Sheet for Patients: SugarRoll.be Fact Sheet for Healthcare Providers: https://www.woods-mathews.com/ This test is not yet approved or cleared by the [place] FDA and  has been authorized for detection and/or diagnosis of SARS-CoV-2 by FDA under an Emergency Use Authorization (EUA). This EUA will remain  in effect (meaning this test can be used) for the duration of the COVID-19 declaration under Section 56 4(b)(1) of the Act, 21 U.S.C. section 360bbb-3(b)(1), unless the authorization is terminated or revoked sooner. Performed at [facility], [place] [address] [place], [place] [address]       Radiology Studies: MR BRAIN WO CONTRAST  Result Date: 01/07/2020 CLINICAL DATA:  Left-sided weakness, code stroke follow-up EXAM: MRI HEAD WITHOUT CONTRAST TECHNIQUE: Multiplanar, multiecho pulse sequences of the brain and surrounding structures were obtained without intravenous contrast. COMPARISON:  11/14/2019 FINDINGS: Brain: There are numerous small foci of restricted diffusion involving multiple vascular territories bilaterally. Multiple chronic small infarcts are identified including involvement of the central white matter, basal ganglia, thalamus, and cerebellum. There is wallerian degeneration along the right cerebral peduncle. Foci of susceptibility likely reflecting chronic blood products are again identified in association with several of these chronic infarcts. There is no intracranial mass, mass effect, or edema. Ventricles are stable in size. There is no hydrocephalus or extra-axial  fluid collection. Vascular: Major vessel flow voids at the skull base are preserved. Skull and upper cervical spine: Normal marrow signal is preserved. Sinuses/Orbits: Paranasal sinuses are aerated. Orbits are unremarkable. Other: Sella is unremarkable. Mastoid air cells are clear. Right posterior scalp lipoma. IMPRESSION: Numerous small acute infarctions involving bilateral anterior and posterior circulations. Multiple chronic infarcts. Electronically Signed   By: Macy Mis M.D.   On: 01/07/2020 15:13   DG CHEST PORT 1 VIEW  Result Date: 01/07/2020 CLINICAL DATA:  49 year old male with history of fever. Hemodialysis patient. EXAM: PORTABLE CHEST 1 VIEW COMPARISON:  Chest x-ray 01/02/2020. FINDINGS: Right internal jugular PermCath with tip terminating at the superior cavoatrial junction. Lung volumes are normal. Linear scarring in the left lower lung. No consolidative airspace disease. No pleural effusions. No pneumothorax. No pulmonary nodule or mass noted. Pulmonary vasculature and the cardiomediastinal silhouette are within normal limits. Electronic device projecting over the left side of the heart, presumably an implantable loop recorder. IMPRESSION: 1. Low lung volumes without radiographic evidence of acute cardiopulmonary disease. Electronically Signed   By: Vinnie Langton M.D.   On: 01/07/2020 08:50   VAS [place] LOWER EXTREMITY VENOUS (DVT)  Result Date: 01/07/2020  Lower Venous DVTStudy Indications: Edema.  Limitations: Open wound and poor ultrasound/tissue interface. Comparison Study: LEV 08-12-19, negative. Performing Technologist: Baldwin Crown ARDMS, RVT  Examination Guidelines: A complete evaluation includes B-mode imaging, spectral Doppler, color Doppler, and power Doppler  as needed of all accessible portions of each vessel. Bilateral testing is considered an integral part of a complete examination. Limited examinations for reoccurring indications may be performed as noted. The reflux  portion of the exam is performed with the patient in reverse Trendelenburg.  +-----+---------------+---------+-----------+----------+--------------+  RIGHT Compressibility Phasicity Spontaneity Properties Thrombus Aging  +-----+---------------+---------+-----------+----------+--------------+  CFV   Full            Yes       Yes                                    +-----+---------------+---------+-----------+----------+--------------+   +---------+---------------+---------+-----------+----------+-------------------+  LEFT      Compressibility Phasicity Spontaneity Properties Thrombus Aging       +---------+---------------+---------+-----------+----------+-------------------+  CFV       Full            No        Yes                                         +---------+---------------+---------+-----------+----------+-------------------+  SFJ                                                        Harvest for bypass?  +---------+---------------+---------+-----------+----------+-------------------+  FV Prox   Full                                                                  +---------+---------------+---------+-----------+----------+-------------------+  FV Mid    Full                                                                  +---------+---------------+---------+-----------+----------+-------------------+  FV Distal Full                                                                  +---------+---------------+---------+-----------+----------+-------------------+  PFV       Full                                                                  +---------+---------------+---------+-----------+----------+-------------------+  POP       Full            No        Yes                                         +---------+---------------+---------+-----------+----------+-------------------+  PTV       Full                                             poorly visualized     +---------+---------------+---------+-----------+----------+-------------------+  PERO      Full                                             poorly visualized    +---------+---------------+---------+-----------+----------+-------------------+ Multiple hypoechoic, oval structures with echogenic centers seen in left groin, likely lymph nodes. Largest measuring 2.4 x 1.1 x 1.4 cm. Anechoic structure with no blood flow seen in left groin, hematoma vs seroma, measures 3.4 x 1.3 x 2.2 cm.   Summary: RIGHT: - No evidence of common femoral vein obstruction.  LEFT: - There is no evidence of deep vein thrombosis in the lower extremity. All veins examined are compressible, however there appears to be continuous flow in CFV and popliteal vein. Portions of this examination were limited- see technologist comments above.  - No cystic structure found in the popliteal fossa.  - Multiple hypoechoic, oval structures with echogenic centers seen in left groin, likely lymph nodes. Largest measuring 2.4 x 1.1 x 1.4 cm. Anechoic structure with no blood flow seen in left groin, hematoma vs seroma, measures 3.4 x 1.3 x 2.2 cm.  *See table(s) above for measurements and observations. Electronically signed by Curt Jews MD on 01/07/2020 at 3:48:56 PM.    Final      LOS: 8 days   Time spent: More than 50% of that time was spent in counseling and/or coordination of care.  Antonieta Pert, MD [facility]  01/08/2020, 11:09 AM

## 2020-01-08 NOTE — Evaluation (Signed)
Physical Therapy Re-evaluation Patient Details Name: Phillip Camacho MRN: [id number] DOB: 03/16/1971 Today's Date: 01/08/2020   History of Present Illness  Patient is a 49 y/o male who presents with left foot pain and ischemia. He was admitted in January with recurrent CVA, likely embolic, has had increased weakness on the left and cognitive deficits since then. S/p Left femoral to below-knee popliteal bypass with reverse great saphenous vein and Right internal jugular vein 3/11. s/p tunneled dialysis catheter placement 3/12 due to worsening kidney disease and initiated dialysis. 3/15 Left brachiocephalic AV fistula creation; 3/16 AMS with code stroke called, CT head new area of hypoattenuation in the subcortical white matter with probable extension to the cortex in the right frontal operculum; MRI 3/17 Numerous small acute infarctions involving bilateral anterior and posterior circulations. PMh includes DM, HTN, CKD stage IV and recurrent CVAs  Clinical Impression   Contacted by Earney Navy, PTA that pt had a significant change in status (s/p another CVA) and needed to be re-evaluated. Patient now s/p additional strokes (bilaterally). RN reports son was present earlier and could not get pt to follow commands or to attempt speaking. RN had also tried virtual interpreter without success. Patient was alert throughout session, however required total assist due to not following commands or responding to tactile cues. This is a significant decline. Noted plans for Palliative Care to consult with patient/family to discuss goals of care. Will follow to see if patient shows improvement/ability to participate and to see results of Palliative consult.  Pt currently with functional limitations due to the deficits listed below (see PT Problem List). Pt may benefit from skilled PT to increase their independence and safety with mobility to allow discharge to the venue listed below.       Follow Up Recommendations  SNF;Supervision/Assistance - 24 hour    Equipment Recommendations  Other (comment)(defer to SNF)    Recommendations for Other Services       Precautions / Restrictions Precautions Precautions: Fall Restrictions Weight Bearing Restrictions: No      Mobility  Bed Mobility Overal bed mobility: Needs Assistance Bed Mobility: Rolling Rolling: Total assist         General bed mobility comments: Pt alert and did not follow gestures for rolling rt or left; once passively initiated, he pulled slightly with UEs againt PT's arm then rail  Transfers                    Ambulation/Gait                Stairs            Wheelchair Mobility    Modified Rankin (Stroke Patients Only) Modified Rankin (Stroke Patients Only) Pre-Morbid Rankin Score: Moderately severe disability Modified Rankin: Severe disability     Balance                                             Pertinent Vitals/Pain Pain Assessment: Faces Faces Pain Scale: No hurt    Home Living Family/patient expects to be discharged to:: Skilled nursing facility Living Arrangements: Children(with son) Available Help at Discharge: Family;Available PRN/intermittently Type of Home: Apartment Home Access: Stairs to enter         Additional Comments: unable to obtain prior level of function info from patient at this time due to grogginess/confusion/language barrier    Prior  Function Level of Independence: Needs assistance   Gait / Transfers Assistance Needed: Reports walking and lots of falls since CVA January 2021     Comments: per chart review; pt unable to communicate with interpreter (RN reports also was unable to communicate with his son)     Hand Dominance   Dominant Hand: Right    Extremity/Trunk Assessment   Upper Extremity Assessment Upper Extremity Assessment: Defer to OT evaluation    Lower Extremity Assessment Lower Extremity Assessment: RLE  deficits/detail;LLE deficits/detail RLE Deficits / Details: PROM WFL; did not engage in ROM; spontaneously moves limbs x 4 LLE Deficits / Details: hip/knee PROM WFL (ankle/toes NT due to ischemia);     Cervical / Trunk Assessment Cervical / Trunk Assessment: Normal  Communication   Communication: Prefers language other than English(per son, he speaks some english)  Cognition Arousal/Alertness: Lethargic Behavior During Therapy: Flat affect Overall Cognitive Status: Difficult to assess                     Current Attention Level: Focused         Problem Solving: Requires verbal cues;Requires tactile cues        General Comments General comments (skin integrity, edema, etc.): Patient turns his head to name called. Patient noted to have been incontinent of loose stool. Incorporated rolling and cleaning pt into evaluation.     Exercises     Assessment/Plan    PT Assessment Patient needs continued PT services  PT Problem List Decreased strength;Decreased mobility;Decreased safety awareness;Decreased range of motion;Decreased activity tolerance;Decreased cognition;Cardiopulmonary status limiting activity;Decreased skin integrity;Pain;Decreased balance;Decreased knowledge of use of DME       PT Treatment Interventions Therapeutic activities;Gait training;Therapeutic exercise;Patient/family education;Balance training;Functional mobility training;DME instruction;Cognitive remediation;Neuromuscular re-education    PT Goals (Current goals can be found in the Care Plan section)  Acute Rehab PT Goals Patient Stated Goal: none stated PT Goal Formulation: Patient unable to participate in goal setting Time For Goal Achievement: 01/22/20 Potential to Achieve Goals: Poor    Frequency Min 2X/week   Barriers to discharge Decreased caregiver support      Co-evaluation               AM-PAC PT "6 Clicks" Mobility  Outcome Measure Help needed turning from your back to  your side while in a flat bed without using bedrails?: Total Help needed moving from lying on your back to sitting on the side of a flat bed without using bedrails?: Total Help needed moving to and from a bed to a chair (including a wheelchair)?: Total Help needed standing up from a chair using your arms (e.g., wheelchair or bedside chair)?: Total Help needed to walk in hospital room?: Total Help needed climbing 3-5 steps with a railing? : Total 6 Click Score: 6    End of Session   Activity Tolerance: Treatment limited secondary to medical complications (Comment)(not following commands s/p new CVAs) Patient left: in bed;with call bell/phone within reach;with bed alarm set Nurse Communication: Other (comment)(incontinent of BM; cleaned pt) PT Visit Diagnosis: Pain;Muscle weakness (generalized) (M62.81);Other symptoms and signs involving the nervous system (R29.898) Pain - Right/Left: Left Pain - part of body: Leg    Time: 9381-0175 PT Time Calculation (min) (ACUTE ONLY): 17 min   Charges:   PT Evaluation $PT Re-evaluation: 1 Re-eval           Arby Barrette, PT Pager [phone]   Rexanne Mano 01/08/2020, 4:50 PM

## 2020-01-08 NOTE — Progress Notes (Signed)
[facility] Progress Note   Assessment/ Plan:   Pt is a 49 y.o. yo male with DM. HTN, history of CVA's and progressive CKD-  Followed by Dr. Durwin Reges mose recently who was admitted on 12/31/2019 with and ischemic foot   Assessment/Plan: 1. Renal-  Progressive CKD, failure to thrive and many episodes of hyperkalemia so decision has been made to go ahead and initiate chronic dialysis for ESRD.  S/p [facility] and first HD treatment 3/12  Second treatment Monday 3/15.  HD 3/17, s/p AVF 3/15, appreciate VVS.  Searching for OP HD unit- lives in [place] but seem to be looking at SNF as pt cannot be left alone and son works.  Renal navigator on the case.   2.  Encephalopathy- this is worsening- MRI with new strokes, possibly embolic- agree with repeat TTE and TEE.  He's on broad-spectrum abx.  Appreciate neuro.  ? seizures   3. Hyperkalemia-  Improved with medical treatment -  stopped lokelma since starting HD 4. Anemia- iron stores low-  Need to repleting with ferrlecit  and also started ESA 5. Secondary hyperparathyroidism- phos high, start binder  6. HTN/volume-  BP high- norvasc 10 and prn hydralazine, will change to scheduled- BP much better 7. Metabolic acidosis-  resolved 8. Ischemic foot- s/p fempop bypass per vasc.   9.  Multiple CVA's-  On secondary prevention.  Neuro following, possible event AM 3/16.  S/p MRI 3/18, possible new embolic strokes 10.  Sepsis: worsening leukocytosis, blood cultures collected peripherally 3/16, culture from HD cath 3/17, all neg so far, CXR looks clear on my read, will start vanc/ cefepime and go from there.    11.  Dispo-  Needs SNF due to confusion and eyesight.  It appears that prognosis is poor.  Downturn- could consider pall c/s    Subjective:    Sleeping, lethargic, new strokes, possibly embolic on MRI.  Objective:   BP 138/60 (BP Location: Right Arm)   Pulse (!) 103   Temp 99 F (37.2 C) (Oral)   Resp 14   Ht 5\' 5"  (1.651 m)   Wt 53.4  kg   SpO2 96%   BMI 19.59 kg/m   Physical Exam: Gen: sleeping, encephalopathic CVS: RRR Resp: clear bilaterally  Abd: soft Ext: no LE edema, L leg with well-healing incisions and erythema/warmth of the L foot, possible streaking redness upwards.  Ischemic changes of the toes. ACCESS: R IJ TDC, L AVF + T/B  Labs: BMET Recent Labs  Lab 01/02/20 0651 01/02/20 1123 01/03/20 0331 01/03/20 1050 01/03/20 1451 01/03/20 1845 01/04/20 0153 01/05/20 0235 01/06/20 0133 01/07/20 0412 01/08/20 0228  NA 141  --  144  --   --   --  140 141 140 139 135  K 4.5   < > 4.0   < > 4.4 4.0 3.6 4.2 4.3 4.5 4.5  CL 108  --  104  --   --   --  103 106 99 97* 97*  CO2 21*  --  26  --   --   --  26 26 22 22  19*  GLUCOSE 125*  --  122*  --   --   --  72 103* 88 139* 216*  BUN 46*  --  22*  --   --   --  27* 31* 19 43* 35*  CREATININE 5.54*  --  3.64*  --   --   --  4.06* 4.72* 3.77* 6.27* 4.80*  CALCIUM 8.2*  --  8.2*  --   --   --  8.4* 8.6* 8.1* 8.2* 7.7*  PHOS  --   --   --   --   --   --  3.3 3.6 4.1 5.1* 5.7*   < > = values in this interval not displayed.   CBC Recent Labs  Lab 01/05/20 0235 01/06/20 0831 01/07/20 0412 01/08/20 0228  WBC 17.0* 19.1* 22.8* 33.5*  HGB 8.1* 8.7* 7.7* 7.7*  HCT 25.9* 29.1* 25.7* 24.9*  MCV 91.8 93.6 92.4 91.5  PLT 531* 610* 529* 524*      Medications:    . aspirin EC  81 mg Oral Daily  . aspirin  300 mg Rectal Daily  . atorvastatin  80 mg Oral q1800  . calcium acetate  667 mg Oral TID WC  . Chlorhexidine Gluconate Cloth  6 each Topical Q0600  . darbepoetin (ARANESP) injection - DIALYSIS  100 mcg Intravenous Q Mon-HD  . heparin  5,000 Units Subcutaneous Q8H  . insulin aspart  0-9 Units Subcutaneous Q4H  . multivitamin  1 tablet Oral QHS  . mupirocin ointment  1 application Nasal BID  . sodium chloride flush  3 mL Intravenous Q12H  . ticagrelor  90 mg Oral BID  . vancomycin  500 mg Intravenous Q M,W,F-HD     Madelon Lips, MD 01/08/2020,  11:59 AM

## 2020-01-09 ENCOUNTER — Ambulatory Visit: Payer: 59 | Admitting: Family Medicine

## 2020-01-09 ENCOUNTER — Inpatient Hospital Stay (HOSPITAL_COMMUNITY): Payer: 59

## 2020-01-09 LAB — CBC
HCT: 26 % — ABNORMAL LOW (ref 39.0–52.0)
Hemoglobin: 8 g/dL — ABNORMAL LOW (ref 13.0–17.0)
MCH: 28.2 pg (ref 26.0–34.0)
MCHC: 30.8 g/dL (ref 30.0–36.0)
MCV: 91.5 fL (ref 80.0–100.0)
Platelets: 600 10*3/uL — ABNORMAL HIGH (ref 150–400)
RBC: 2.84 MIL/uL — ABNORMAL LOW (ref 4.22–5.81)
RDW: 14.6 % (ref 11.5–15.5)
WBC: 35.2 10*3/uL — ABNORMAL HIGH (ref 4.0–10.5)
nRBC: 0.1 % (ref 0.0–0.2)

## 2020-01-09 LAB — RENAL FUNCTION PANEL
Albumin: 1.4 g/dL — ABNORMAL LOW (ref 3.5–5.0)
Anion gap: 20 — ABNORMAL HIGH (ref 5–15)
BUN: 58 mg/dL — ABNORMAL HIGH (ref 6–20)
CO2: 19 mmol/L — ABNORMAL LOW (ref 22–32)
Calcium: 8.2 mg/dL — ABNORMAL LOW (ref 8.9–10.3)
Chloride: 99 mmol/L (ref 98–111)
Creatinine, Ser: 6.36 mg/dL — ABNORMAL HIGH (ref 0.61–1.24)
GFR calc Af Amer: 11 mL/min — ABNORMAL LOW (ref >60)
GFR calc non Af Amer: 9 mL/min — ABNORMAL LOW (ref >60)
Glucose, Bld: 163 mg/dL — ABNORMAL HIGH (ref 70–99)
Phosphorus: 5.1 mg/dL — ABNORMAL HIGH (ref 2.5–4.6)
Potassium: 4.3 mmol/L (ref 3.5–5.1)
Sodium: 138 mmol/L (ref 135–145)

## 2020-01-09 LAB — GLUCOSE, CAPILLARY
Glucose-Capillary: 139 mg/dL — ABNORMAL HIGH (ref 70–99)
Glucose-Capillary: 142 mg/dL — ABNORMAL HIGH (ref 70–99)
Glucose-Capillary: 144 mg/dL — ABNORMAL HIGH (ref 70–99)
Glucose-Capillary: 157 mg/dL — ABNORMAL HIGH (ref 70–99)
Glucose-Capillary: 167 mg/dL — ABNORMAL HIGH (ref 70–99)
Glucose-Capillary: 185 mg/dL — ABNORMAL HIGH (ref 70–99)

## 2020-01-09 LAB — PHOSPHORUS
Phosphorus: 2.7 mg/dL (ref 2.5–4.6)
Phosphorus: 3.2 mg/dL (ref 2.5–4.6)

## 2020-01-09 LAB — MAGNESIUM
Magnesium: 2.2 mg/dL (ref 1.7–2.4)
Magnesium: 2.1 mg/dL (ref 1.7–2.4)

## 2020-01-09 MED ORDER — HEPARIN SODIUM (PORCINE) 1000 UNIT/ML IJ SOLN
INTRAMUSCULAR | Status: AC
  Administered 2020-01-09: 3000 [IU]
  Filled 2020-01-09: qty 3

## 2020-01-09 MED ORDER — VANCOMYCIN HCL IN DEXTROSE 500-5 MG/100ML-% IV SOLN
INTRAVENOUS | Status: AC
  Administered 2020-01-09: 500 mg via INTRAVENOUS
  Filled 2020-01-09: qty 100

## 2020-01-09 MED ORDER — RENA-VITE PO TABS
1.0000 | ORAL_TABLET | Freq: Every day | ORAL | Status: DC
  Administered 2020-01-09: 1 via NASOGASTRIC
  Filled 2020-01-09: qty 1

## 2020-01-09 MED ORDER — OXYCODONE-ACETAMINOPHEN 5-325 MG PO TABS: 1.0000 | ORAL_TABLET | ORAL | Status: DC | PRN

## 2020-01-09 MED ORDER — NEPRO/CARBSTEADY PO LIQD
1000.0000 mL | ORAL | Status: DC
  Administered 2020-01-09: 1000 mL via ORAL

## 2020-01-09 MED ORDER — ATORVASTATIN CALCIUM 80 MG PO TABS
80.0000 mg | ORAL_TABLET | Freq: Every day | ORAL | Status: DC
  Administered 2020-01-09: 80 mg via NASOGASTRIC
  Filled 2020-01-09: qty 1

## 2020-01-09 MED ORDER — TICAGRELOR 90 MG PO TABS
90.0000 mg | ORAL_TABLET | Freq: Two times a day (BID) | ORAL | Status: DC
  Administered 2020-01-09 (×2): 90 mg via NASOGASTRIC
  Filled 2020-01-09 (×2): qty 1

## 2020-01-09 MED ORDER — PRO-STAT SUGAR FREE PO LIQD
30.0000 mL | Freq: Two times a day (BID) | ORAL | Status: DC
  Administered 2020-01-09: 30 mL
  Filled 2020-01-09: qty 30

## 2020-01-09 NOTE — Progress Notes (Signed)
[facility] Progress Note   Assessment/ Plan:   Pt is a 49 y.o. yo male with DM. HTN, history of CVA's and progressive CKD-  Followed by Dr. Durwin Reges mose recently who was admitted on 12/31/2019 with and ischemic foot   Assessment/Plan: 1. Renal-  Progressive CKD, failure to thrive and many episodes of hyperkalemia so decision has been made to go ahead and initiate chronic dialysis for ESRD.  S/p [facility] and first HD treatment 3/12  Second treatment Monday 3/15.  HD #3 3/17, s/p AVF 3/15, appreciate VVS.  Searching for OP HD unit- lives in [place] but seem to be looking at SNF as pt cannot be left alone and son works.  Renal navigator on the case.  MWF schedule now 2.  Encephalopathy- this is worsening- MRI with new strokes, possibly embolic- agree with repeat TTE and TEE, I ordered TTE.  He's on broad-spectrum abx.  Appreciate neuro.  ? seizures   3. Hyperkalemia-  Improved with medical treatment -  stopped lokelma since starting HD 4. Anemia- iron stores low-  Need to repleting with ferrlecit  and also started ESA 5. Secondary hyperparathyroidism- phos high, start binder  6. HTN/volume-  BP high- norvasc 10 and prn hydralazine, will change to scheduled- BP much better 7. Metabolic acidosis-  resolved 8. Ischemic foot- s/p fempop bypass per vasc.   9.  Multiple CVA's-  On secondary prevention.  Neuro following, possible event AM 3/16.  S/p MRI 3/18, possible new embolic strokes 10.  Sepsis: worsening leukocytosis, blood cultures collected peripherally 3/16, culture from HD cath 3/17, all neg so far, CXR looks clear on my read, started vanc/ cefepime 3/17. WBC ct uptrending- no reports of diarrhea, in setting of uptrending WBC, would consider broadening cefepime to meropenem.    11.  Dispo-  Needs SNF due to confusion and eyesight.  It appears that prognosis is poor.  Downturn- could consider pall c/s    Subjective:    Sleeping, lethargic, new strokes, possibly embolic on  MRI.  Objective:   BP 135/66 (BP Location: Right Arm)   Pulse 93   Temp 98.6 F (37 C) (Oral)   Resp 15   Ht 5\' 5"  (1.651 m)   Wt 56 kg   SpO2 100%   BMI 20.54 kg/m   Physical Exam: Gen: sleeping, encephalopathic CVS: RRR Resp: clear bilaterally  Abd: soft Ext: no LE edema, L leg with well-healing incisions and erythema/warmth of the L foot, possible streaking redness upwards.  Ischemic changes of the toes. ACCESS: R IJ TDC, L AVF + T/B  Labs: BMET Recent Labs  Lab 01/03/20 0331 01/03/20 1050 01/03/20 1845 01/04/20 0153 01/05/20 0235 01/06/20 0133 01/07/20 0412 01/08/20 0228 01/09/20 0239  NA 144  --   --  140 141 140 139 135 138  K 4.0   < > 4.0 3.6 4.2 4.3 4.5 4.5 4.3  CL 104  --   --  103 106 99 97* 97* 99  CO2 26  --   --  26 26 22 22  19* 19*  GLUCOSE 122*  --   --  72 103* 88 139* 216* 163*  BUN 22*  --   --  27* 31* 19 43* 35* 58*  CREATININE 3.64*  --   --  4.06* 4.72* 3.77* 6.27* 4.80* 6.36*  CALCIUM 8.2*  --   --  8.4* 8.6* 8.1* 8.2* 7.7* 8.2*  PHOS  --   --   --  3.3 3.6 4.1  5.1* 5.7* 5.1*   < > = values in this interval not displayed.   CBC Recent Labs  Lab 01/06/20 0831 01/07/20 0412 01/08/20 0228 01/09/20 0239  WBC 19.1* 22.8* 33.5* 35.2*  HGB 8.7* 7.7* 7.7* 8.0*  HCT 29.1* 25.7* 24.9* 26.0*  MCV 93.6 92.4 91.5 91.5  PLT 610* 529* 524* 600*      Medications:    . aspirin EC  81 mg Oral Daily  . aspirin  300 mg Rectal Daily  . atorvastatin  80 mg Per NG tube q1800  . calcium acetate  667 mg Oral TID WC  . Chlorhexidine Gluconate Cloth  6 each Topical Q0600  . darbepoetin (ARANESP) injection - DIALYSIS  100 mcg Intravenous Q Mon-HD  . feeding supplement (PRO-STAT SUGAR FREE 64)  30 mL Per Tube BID  . heparin  5,000 Units Subcutaneous Q8H  . insulin aspart  0-9 Units Subcutaneous Q4H  . multivitamin  1 tablet Per NG tube QHS  . mupirocin ointment  1 application Nasal BID  . sodium chloride flush  3 mL Intravenous Q12H  . ticagrelor   90 mg Per NG tube BID  . vancomycin  500 mg Intravenous Q M,W,F-HD     Madelon Lips, MD 01/09/2020, 2:20 PM

## 2020-01-09 NOTE — Progress Notes (Addendum)
  Speech Language Pathology Treatment: Dysphagia  Patient Details Name: Phillip Camacho MRN: [id number] DOB: April 08, 1971 Today's Date: 01/09/2020 Time: 0911-0922 SLP Time Calculation (min) (ACUTE ONLY): 11 min  Assessment / Plan / Recommendation Clinical Impression  Pt was seen for dysphagia treatment to assess improvement in swallow function and his ability to tolerate a p.o. diet. Pt's level of alertness was slightly improved and he verbally responded, "yes" to one question. However, pt continues to demonstrate poor bolus awareness with absent bolus manipulation and reduced acceptance. Short-term non-oral alimentation (e.g., Cortrak) is recommended at this time and SLP will continue to follow to assess improvement.    HPI HPI: Pt is a 49 y.o. male with medical history significant for insulin-dependent diabetes mellitus, hypertension, chronic kidney disease stage IV, and recurrent CVAs, likely embolic, now presenting to the emergency department with left foot pain. MRI of the brain: Numerous small acute infarctions involving bilateral anterior and posterior circulations. EEG: moderate to severe diffuse encephalopathy of non-specific etiology.      SLP Plan          Recommendations  Diet recommendations: NPO with short-term non-oral nutrition Medication Administration: Via alternative means                Oral Care Recommendations: Oral care QID Follow up Recommendations: Other (comment)(TBD) SLP Visit Diagnosis: Dysphagia, oropharyngeal phase (R13.12)       Iyahna Obriant I. Hardin Negus, [place], [place] Office number [phone] Pager [place] 01/09/2020, 9:26 AM

## 2020-01-09 NOTE — Progress Notes (Signed)
Nutrition Follow-up  DOCUMENTATION CODES:   Severe malnutrition in context of chronic illness  INTERVENTION:  Begin: -Nepro @ 20 ml/hr -Increase by 10 ml Q4 hours to goal rate of 40 ml/hr (960 ml) -30 ml Prostat BID  At goal TF provides: 1928 kcals, 110 grams protein, 698 ml free water.   Continue Renal MVI daily  NUTRITION DIAGNOSIS:   Severe Malnutrition related to chronic illness(CKD, recurrent CVAs) as evidenced by severe muscle depletion, severe fat depletion.   GOAL:   Patient will meet greater than or equal to 90% of their needs  Not met.   MONITOR:   TF tolerance, Skin, Weight trends, Labs, I & O's  REASON FOR ASSESSMENT:   LOS    ASSESSMENT:   Patient with PMH significant for DM, HTN, CKD stage IV, and recurrent CVAs. Presents this admission with ischemic of left lower extremity.  3/11- s/p L fem-pop bypass 3/12- s/p R TDC, first HD 3/15- s/p L AVF creation 3/19 - Cortrak (gastric)  Pt sleeping at time of RD visit and would not wake to RD voice. RD unable to obtain detailed diet and weight history at this time.   Of note, records indicate pt weighed 67.1 kg on 10/01/2019 and 52.6 kg this admission. Unable to determine dry wt loss vs fluid fluctuations  I/O: +333.43m since admit UOP: none documented Last HD 3/17; net UF: 5229m Medications reviewed and include: Phoslo, SSI, Rena-vit Labs reviewed: Cr 6.36, Phosphorus 5.1, Corrected Calcium 10.28 CBGs 139-185  NUTRITION - FOCUSED PHYSICAL EXAM:    Most Recent Value  Orbital Region  Mild depletion  Upper Arm Region  Severe depletion  Thoracic and Lumbar Region  Unable to assess  Buccal Region  Moderate depletion  Temple Region  Severe depletion  Clavicle Bone Region  Severe depletion  Clavicle and Acromion Bone Region  Severe depletion  Scapular Bone Region  Unable to assess  Dorsal Hand  Severe depletion  Patellar Region  Severe depletion  Anterior Thigh Region  Severe depletion  Posterior  Calf Region  Severe depletion  Edema (RD Assessment)  None  Hair  Reviewed  Eyes  Reviewed  Mouth  Reviewed  Skin  Reviewed  Nails  Reviewed       Diet Order:   Diet Order            Diet NPO time specified  Diet effective now              EDUCATION NEEDS:   Not appropriate for education at this time  Skin:  Skin Assessment: Skin Integrity Issues: Skin Integrity Issues:: Stage II, Other (Comment) Stage II: sacrum Other: L leg, chest, L arm  Last BM:  3/19  Height:   Ht Readings from Last 1 Encounters:  01/02/20 5' 5"  (1.651 m)    Weight:   Wt Readings from Last 1 Encounters:  01/09/20 56 kg    BMI:  Body mass index is 20.54 kg/m.  Estimated Nutritional Needs:   Kcal:  1750-1950 kcal  Protein:  90-115 gram  Fluid:  1000 ml + UOP    AmLarkin InaMS, RD, LDN RD pager number and weekend/on-call pager number located in [facility]

## 2020-01-09 NOTE — CV Procedure (Signed)
2D echo attempted, but patient went to dialysis.

## 2020-01-09 NOTE — Progress Notes (Signed)
Pharmacy Antibiotic Note  MARGUIS MATHIESON is a 49 y.o. male admitted on 12/31/2019 with ischemic foot. Pharmacy has been consulted for Vancomycin / Cefepime dosing on 3/17 for fever, r/o sepsis.  Cultures have been negative.  New HD patient.  Last HD 3/17 w/ BFR 400 x 3 hrs.  Received vancomycin appropriately at end of session.  Plan: Continue vancomycin 500 mg IV after HD MWF Cefepime 1 gram iv Q 24 hours  Follow up HD tolerance/schedule.  Height: 5\' 5"  (165.1 cm) Weight: 123 lb 7.3 oz (56 kg) IBW/kg (Calculated) : 61.5  Temp (24hrs), Avg:98.9 F (37.2 C), Min:98.4 F (36.9 C), Max:99.4 F (37.4 C)  Recent Labs  Lab 01/05/20 0235 01/06/20 0133 01/06/20 0831 01/07/20 0412 01/07/20 1603 01/07/20 1855 01/08/20 0228 01/09/20 0239  WBC 17.0*  --  19.1* 22.8*  --   --  33.5* 35.2*  CREATININE 4.72* 3.77*  --  6.27*  --   --  4.80* 6.36*  LATICACIDVEN  --   --   --   --  1.1 0.9  --   --     Estimated Creatinine Clearance: 11.3 mL/min (A) (by C-G formula based on SCr of 6.36 mg/dL (H)).    No Known Allergies  Vancomycin 3/17> Cefepime 3/17>  3/16 blood x2- ngtd 3/17 blood x2- ngtd  Marguerite Olea, [facility] Clinical Pharmacist Phone [phone]  01/09/2020 8:31 AM

## 2020-01-09 NOTE — Procedures (Signed)
Cortrak  Person Inserting Tube:  Hilliard Borges, RD Tube Type:  Cortrak - 43 inches Tube Location:  Left nare Initial Placement:  Stomach Secured by: Bridle Technique Used to Measure Tube Placement:  Documented cm marking at nare/ corner of mouth Cortrak Secured At:  75 cm   No x-ray is required. RN may begin using tube.   If the tube becomes dislodged please keep the tube and contact the Cortrak team at [email or web address] (password [email or web address]) for replacement.  If after hours and replacement cannot be delayed, place a NG tube and confirm placement with an abdominal x-ray.    Caryle Helgeson RD, LDN Clinical Nutrition Pager listed in AMION    

## 2020-01-09 NOTE — Progress Notes (Addendum)
Progress Note    01/09/2020 creation of left upper extremity AV fistula. 7:19 AM 4 Days Post-Op  Subjective: Patient is now postoperative day 8 left femoral to below-knee popliteal bypass with reverse great saphenous vein secondary to critical limb ischemia of the left leg.  In addition, he developed acute renal failure required placement of TDC and creation of left upper extremity brachiocephalic AV fistula.  Awake, not conversant. 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 On Brilinta and aspirin NPO per ST eval  Vitals:   01/09/20 0019 01/09/20 0413  BP: (!) 142/63 115/89  Pulse: 99 96  Resp: 15 16  Temp: 98.5 F (36.9 C) 98.6 F (37 C)  SpO2: 96% 98%    Physical Exam: Cardiac:  RRR Lungs:  nonlabored Incisions:  Left groin and lower leg incisions healing without signs of infection.  Left AC incision healing without signs of infection Extremities:  Left foot insensate. Ischemic changes noted. Brisk AT, Pt and peroneal Doppler signals.  Left hand is warm.  Fistula with palpable pulse.   CBC    Component Value Date/Time   WBC 35.2 (H) 01/09/2020 0239   RBC 2.84 (L) 01/09/2020 0239   HGB 8.0 (L) 01/09/2020 0239   HGB 11.3 (L) 11/12/2019 1616   HCT 26.0 (L) 01/09/2020 0239   HCT 34.1 (L) 11/12/2019 1616   PLT 600 (H) 01/09/2020 0239   PLT 527 (H) 11/12/2019 1616   MCV 91.5 01/09/2020 0239   MCV 84 11/12/2019 1616   MCH 28.2 01/09/2020 0239   MCHC 30.8 01/09/2020 0239   RDW 14.6 01/09/2020 0239   RDW 14.5 11/12/2019 1616   LYMPHSABS 2.7 01/01/2020 0251   MONOABS 1.0 01/01/2020 0251   EOSABS 0.6 (H) 01/01/2020 0251   BASOSABS 0.1 01/01/2020 0251    BMET    Component Value Date/Time   NA 138 01/09/2020 0239   NA 139 11/12/2019 1616   K 4.3 01/09/2020 0239   CL 99 01/09/2020 0239   CO2 19 (L) 01/09/2020 0239   GLUCOSE 163 (H)  01/09/2020 0239   BUN 58 (H) 01/09/2020 0239   BUN 34 (H) 11/12/2019 1616   CREATININE 6.36 (H) 01/09/2020 0239   CALCIUM 8.2 (L) 01/09/2020 0239   GFRNONAA 9 (L) 01/09/2020 0239   GFRAA 11 (L) 01/09/2020 0239     Intake/Output Summary (Last 24 hours) at 01/09/2020 0719 Last data filed at 01/09/2020 0617 Gross per 24 hour  Intake --  Output 1 ml  Net -1 ml    HOSPITAL MEDICATIONS Scheduled Meds: . aspirin EC  81 mg Oral Daily  . aspirin  300 mg Rectal Daily  . atorvastatin  80 mg Oral q1800  . calcium acetate  667 mg Oral TID WC  . Chlorhexidine Gluconate Cloth  6 each Topical Q0600  . darbepoetin (ARANESP) injection - DIALYSIS  100 mcg Intravenous Q Mon-HD  . heparin  5,000 Units Subcutaneous Q8H  . insulin aspart  0-9 Units Subcutaneous Q4H  . multivitamin  1 tablet Oral QHS  . mupirocin ointment  1 application Nasal BID  . sodium chloride flush  3 mL Intravenous Q12H  . ticagrelor  90 mg Oral BID  . vancomycin  500 mg Intravenous Q M,W,F-HD   Continuous Infusions: . sodium chloride 10 mL/hr at 01/02/20 0916  . ceFEPime (MAXIPIME) IV 1 g (01/08/20 2022)   PRN Meds:.acetaminophen **  OR** acetaminophen, fentaNYL (SUBLIMAZE) injection, hydrALAZINE, morphine injection, ondansetron **OR** ondansetron (ZOFRAN) IV, oxyCODONE-acetaminophen  Assessment:  49 y.o. male is s/p: left fem-pop bypass with ischemic injury to left foot. Will need some level of LLE amputation at some point.     Plan: Awaiting SNF placement. Continue to monitor foot and care plans -DVT prophylaxis:  Heparin Brielle   Risa Grill, PA-C [facility] [phone] 01/09/2020  7:19 AM   I have examined the patient, reviewed and agree with above.  No acute events.  Continued overall clinical deterioration since January of this year.  Not able to communicate.  Left leg vein harvest incisions all healing.  2-3+ popliteal graft pulse.  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  Agree  with plan for eventual skilled nursing.  We will continue to monitor his foot as it demarcates.  I will see again on Monday.  Please call my partners over the weekend for vascular concerns  Curt Jews, MD 01/09/2020 8:15 AM

## 2020-01-09 NOTE — Progress Notes (Signed)
STROKE TEAM PROGRESS NOTE   INTERVAL HISTORY His family is not at bedside. Patient with confusion since last stroke Jan 2021, progressive mental and physical decline. Awake and alert, non-cooperative, keeps pulling blanket over head. Not following commands this morning but moving all extremities.neuro stable.   Vitals:   01/08/20 2026 01/09/20 0019 01/09/20 0413 01/09/20 0820  BP: 133/90 (!) 142/63 115/89 134/67  Pulse: 100 99 96 98  Resp: 12 15 16 16   Temp: 99 F (37.2 C) 98.5 F (36.9 C) 98.6 F (37 C) 98.4 F (36.9 C)  TempSrc: Oral Oral Oral Oral  SpO2: 98% 96% 98% 100%  Weight:   56 kg   Height:        CBC:  Recent Labs  Lab 01/08/20 0228 01/09/20 0239  WBC 33.5* 35.2*  HGB 7.7* 8.0*  HCT 24.9* 26.0*  MCV 91.5 91.5  PLT 524* 600*    Basic Metabolic Panel:  Recent Labs  Lab 01/08/20 0228 01/09/20 0239  NA 135 138  K 4.5 4.3  CL 97* 99  CO2 19* 19*  GLUCOSE 216* 163*  BUN 35* 58*  CREATININE 4.80* 6.36*  CALCIUM 7.7* 8.2*  PHOS 5.7* 5.1*   Lipid Panel:     Component Value Date/Time   CHOL 76 01/08/2020 0228   TRIG 151 (H) 01/08/2020 0228   HDL 18 (L) 01/08/2020 0228   CHOLHDL 4.2 01/08/2020 0228   VLDL 30 01/08/2020 0228   LDLCALC 28 01/08/2020 0228   HgbA1c:  Lab Results  Component Value Date   HGBA1C 7.5 (H) 01/07/2020   Urine Drug Screen:     Component Value Date/Time   LABOPIA NONE DETECTED 09/30/2019 1009   COCAINSCRNUR NONE DETECTED 09/30/2019 1009   LABBENZ NONE DETECTED 09/30/2019 1009   AMPHETMU NONE DETECTED 09/30/2019 1009   THCU NONE DETECTED 09/30/2019 1009   LABBARB NONE DETECTED 09/30/2019 1009    Alcohol Level     Component Value Date/Time   ETH <10 09/30/2019 1000    IMAGING past 24 hours EEG adult  Result Date: 01/08/2020 Lora Havens, MD     01/08/2020 12:15 PM Patient Name: Phillip Camacho MRN: [id number] Epilepsy Attending: Lora Havens Referring Physician/Provider: Dr. Amie [place] Date: 01/07/2020  Duration: 22.52 minutes Patient history: 49 year old male with renal failure on hemodialysis with worsening encephalopathy.  EEG to evaluate for seizures. Level of alertness: Lethargic AEDs during EEG study: None Technical aspects: This EEG study was done with scalp electrodes positioned according to the 10-20 International system of electrode placement. Electrical activity was acquired at a sampling rate of 500Hz  and reviewed with a high frequency filter of 70Hz  and a low frequency filter of 1Hz . EEG data were recorded continuously and digitally stored. Description: No clear posterior dominant was seen.  EEG showed continuous generalized polymorphic 3 to 6 Hz theta-delta slowing.  Hyperventilation photic summation were not performed. Abnormality - Continuous slow, generalized. IMPRESSION: This study is suggestive of moderate to severe diffuse encephalopathy, nonspecific etiology. No seizures or definite epileptiform discharges were seen throughout the recording. Priyanka Barbra Sarks    PHYSICAL EXAM This is a malnourished middle-aged gentleman in no acute distress who is laying in bed intermittently pulling the covers over his head.  Patient is awake but does not follow commands.  Eyes conjugate and midline, EOM appear intact,  Blind in right eye, could not assess pupils due to noncooperation,  Face appears symmetric,he is non verbal but he does appear to be able to hear  me,  he is moving all his extremities spontaneously and purposefully, he does withdraw to stim x4, no tremor or abnormal movements noted, difficult exam due to noncooperation and encephalopathy.  ASSESSMENT/PLAN [name] is a 49 y.o. male with history of multiple cryptogenic infarcts likely embolic secondary to unknown source (loop recorder), right central retinal artery occlusion, uncontrolled type II diabetes mellitus,hypertension, aortic arch atherosclerosis, intracranial atherosclerotic disease, CKD, and tobacco abuse presenting  with embolic strokes possibly septic emboli with blood cultures pending started on IV antibiotics.   Stroke: embolic secondary to unknown source possibly septic emboli also bilateral severe intracranial stenosis and hx of ulcerated aortic plaque Code Stroke CT head new hypodensity subcortical white matter and right frontal operculum, remote infarcts, ASPECTS 9/10.    MRI of the brain (01/07/2020): Bilateral small acute infarcts in multiple vascular territories  MRA head(11/15/2019) - Moderate intracranial atherosclerotic disease including bilateral terminal ICAs and MCAs  MRA neck: negative(11/15/2019)  EEG with diffuse encephalopathy  Repeat TTE and possibly TEE: recommended  LDL 28  HgbA1c 7.5  Heparin for VTE prophylaxis    Diet   Diet NPO time specified    Brilinta and asa (not on plavix due to high P2Y12 level = 232) prior to admission, now on ASA and Brilinta. May consider empiric anticoagulation given multiple recurrent cryptogenic embolic infarcts.  Therapy recommendations:  pending  Disposition:  pending   Aortic large ulcerated plaque  07/2019 TEE showed moderate, protruding and ulcerated plaque involving the transverse aorta. Maximum plaque thickness is 5 mm. Mobile plaque/thrombus is not identified.  Has been on DAPT treatment  MRA Chest 1/25 - no thoracic aortic aneurysm or dissection or ulcerated plaque.  History of stroke  07/2019 right small cerebellar and bilateral MCA/PCA, right PCA, right thalamic punctate infarcts.  MRA age advanced atherosclerosis, right ICA siphon 50% stenosis, right P2 moderate and P3 high-grade stenosis.  MRA negative.  LE venous Doppler no DVT.  EF 65 to 70%.  TEE showed ulcerated plaque in aortic arch.  Loop recorder placed.  LDL 248 and A1c 11.6.  Patient put on DAPT for 3 months as well as Lipitor 80.  09/2019 admitted for right CRAO, MRI also found new small right frontal lobe, right occipital and left temporal lobe infarcts.   MRA again multifocal severe intracranial stenosis.  Carotid Doppler negative.  Loop recorder no A. fib.  LDL 133 and A1c 9.8.  Patient was asked not to drive.  10/2019: Punctate acute to early subacute infarcts in the left thalamus, subacute left frontal and right MCA/PCA infarcts - embolic pattern - likely due to bilaterally severe intracranial stenosis, aortic ulcerated plaque and hypercoagulable stated from uncontrolled DM. Changed to ASA and Brilinta. Loop recorder interrogated and no afib.Hypercoagulable panel completed and negative.   Uncontrolled diabetes (but improved)  HgbA1c 7.5, goal < 7.0  Close PCP follow-up for DM control   Tobacco abuse  Current smoker  Smoking cessation counseling to be provided  Hypertension   Long-term BP goal 130-150 given multifocal intracranial stenosis  Hyperlipidemia  Home Lipid lowering medication: Lipitor 80 mg daily   LDL 28, goal < 70  Current lipid lowering medication: Lipitor 80 mg daily  Zetia added last admission  Continue statin and Zetia at discharge   Other Active Problems   Sepsis, blood cultures pending, on broad-spectrum antibiotics  Anemia of chronic disease, stable  Critical ischemia of left lower extremity  Multifactorial Acute Encephalopathy  Renal failure on dialysis  Body mass index  19.59, malnutrition  Plan  Recommend repeat TTE and if needed TEE. Currently on Brilinta and asa (not on plavix due to high P2Y12 level = 232) prior to admission, now on ASA and Brilinta. May consider empiric anticoagulation given multiple recurrent cryptogenic embolic infarcts after workup.   Hospital day # 9  Personally examined patient and images, and have participated in and made any corrections needed to history, physical, neuro  exam,assessment and plan as stated above.  I have personally obtained the history, evaluated lab date, reviewed imaging studies and agree with radiology interpretations.    Sarina Ill, MD Stroke Neurology   A total of 25 minutes was spent for the care of this patient, spent on counseling patient and family on different diagnostic and therapeutic options, counseling and coordination of care, riskd ans benefits of management, compliance, or risk factor reduction and education.   To contact Stroke Continuity provider, please refer to [email or web address] After hours, contact General Neurology

## 2020-01-09 NOTE — TOC Progression Note (Signed)
Transition of Care [facility]) - Progression Note    Patient Details  Name: Phillip Camacho MRN: [id number] Date of Birth: 11-17-1970  Transition of [facility]) CM/SW [place], [place] Phone Number: 01/09/2020, 2:41 PM  Clinical Narrative:     Patient has no bed offers and will be difficult to place.  Thurmond Butts, MSW, [facility] Clinical Social Worker   Expected Discharge Plan: Skilled Nursing Facility Barriers to Discharge: Continued Medical Work up, Inadequate or no insurance  Expected Discharge Plan and Services Expected Discharge Plan: [place] arrangements for the past 2 months: Single Family Home                                       Social Determinants of Health (SDOH) Interventions    Readmission Risk Interventions No flowsheet data found.

## 2020-01-09 NOTE — Progress Notes (Signed)
PROGRESS NOTE    Phillip Camacho  VZD:[id number] DOB: June 04, 1971 DOA: 12/31/2019 PCP: [name], MD   Brief Narrative: 49 y.o. 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  Last hospitalized in January 2021 for a stroke since then has been confused, not been eating or drinking, needing supervision with ADLs. In the ED found to have potassium 6.6, bicarbonate 17, BUN 50, and creatinine 5.69, up from 4.8 in January 2021.  CBC notable for leukocytosis to 14,500, stable normocytic anemia, and increased thrombocytosis with platelets of 748,000.  INR is normal.  Covid and influenza PCR are negative.    Patient received temporizing measures with insulin/dextrose, bicarbonate, and Lokelma, nephrology/vascular surgery consulted, patient was admitted for critical left leg ischemia and hyperkalemia and renal failure. 3/11-left femoral to below-knee popliteal bypass.  Patient seen by nephrology and also started on dialysis after placing TDC.  Patient had episode of some weakness and had CT scan that showed abnormal hypoattenuation and underwent MRI and during that time he has been more confused intermittently febrile.  MRI came back with bilateral multiple strokes.  Followed by neurology. 3/17-patient more confused encephalopathy with worsening leukocytosis and episodic fever: Blood cultures return from dialysis catheter and dialysis and placed on vancomycin and Zosyn.  Subjective:  Seen this morning.  Speech therapy working with him.  He is more alert awake but remains confused. No episodes of fever, T-max 99.6.  Saturating 98% room air.  Blood pressure heart rate stable. WBC uptrending 35.2K.  Assessment & Plan:  Progressive CKD stage 4 due to poorly controlled diabetes and hypertension: nephro on board, work-up negative so far with negative  HIV, hep C, hep B.  Holding home losartan. status post tunnel dialysis catheter placement and initiation of dialysis 3/12, AV fistula placed 3/15. Cont HD per nephro and will need clip and skilled nursing facility placement.  Recent Labs  Lab 01/05/20 0235 01/06/20 0133 01/07/20 0412 01/08/20 0228 01/09/20 0239  BUN 31* 19 43* 35* 58*  CREATININE 4.72* 3.77* 6.27* 4.80* 6.36*   Critical ischemia of left lower extremity, 3-5 left toes tip blackish:  S/p Left femoral to below-knee popliteal bypass with reverse great saphenous vein 3/11.  Appreciate vascular input. Continue aspirin, Brilinta and Lipitor- via Cortrak tube once in (ordered 3/17). Left leg erythematous and  Lt 3-5 toes are blackish and getting demarcated being followed by vascular-will need some degree of amputation per vascular. No DVT in lower extremities.    Sepsis with fever/acute encephalopathy: Patient had an intermittent temperature spike, COVID-19 - on 3/17.  Chest x-ray 3/17 no acute finding, blood culture from 3/16 and 3/17-no growth so far.  Now no more fever episode.Continue empiric vancomycin/cefepime (started 3/17).He has RT IJ HD line in place-no evidence of infection.  Leukocytosis: Suspect multifactorial in the setting of fever/sepsis and also contributed by leg ischemia- LT 3-5 TOES now ischemic/gangrenous.    Acute encephalopathy likely multifactorial, metabolic and in relation to bilateral multiple strokes seen on MRI. he had lethargy with Pupillary asymmetry ( chronic). First noted 3/16 am after bath - code stroke activated 3/16- seen by neuro-CT head area of hypoattenuation in the subcortical white matter with probable extension to the cortex in the right frontal operculum, remote infarcts, seen by neurologist and MRI ordered-unable to be completed at he was moving.  Still appears lethargic this  morning-monitor closely.   Multiple acute embolic strokes seen on MRI 3/17, with history of multiple prior strokes:  Appreciate neurology input. MRI of the brain (01/07/2020): Bilateral small acute infarcts in multiple vascular territorie. MRA head(11/15/2019) - Moderate intracranial atherosclerotic diseaseincluding bilateral terminal ICAs and MCAs.MRA neck: negative(11/15/2019).EEG with diffuse encephalopathy. Repeat TTE and TEE: pending- deferred to neruo for further workup/management.  LDL 28, HgbA1c 7..5 Currently smoker will need cessation counseling. currently on Lipitor 80, Zetia added last admission.  Continue risk factor modification .  History recurrent CVAs:Patient was admitted in January with recurrent CVA, likely embolic, has had increased weakness on the left and cognitive deficits since then. Cont home Brilinta, aspirin and Lipitor.    Severe Hyperkalemia: resolved Metabolic acidosis from renal dysfunction-resolved Nephrotic syndrome presented to diabetic nephropathy.  Monitor.  Insulin-requiring or dependent type II diabetes mellitus, hemoglobin A1c 7.3: 3/17.Blood sugar stable, cont  ssi and monitor.   Recent Labs  Lab 01/08/20 1820 01/08/20 2030 01/09/20 0021 01/09/20 0411 01/09/20 0813  GLUCAP 168* 148* 157* 139* 185*   Hypertension,controlled.  Transient episode of hypotension 3/17 afternoon, holding his home Norvasc in the setting of sepsis.  Allow permissive hypertension in the setting of acute stroke  HLD:on statins.  Tobacco OQH:UTML need to advise cessation.  Anemia of CKD-hemoglobin is stable, transfuse for less than 7 g.    Social issues:Patient lives with his son who works during the day and has had difficulty caring for the patient who has been frequently confused since the recent strokes and is often unable to perform basic ADLs on his own.  Nutrition - tube feed and meds via ngt once tube in  Body mass index is 20.54 kg/m.   DVT prophylaxis:Heparin. Code Status:Full.  Family Communication:I have called son and updated-he agrees for skilled nursing facility.  I had  updated his son's over the phone, multiple times.   Disposition Plan: Patient is from:Home Anticipated Disposition:PT recommends SNF.  Son is at work during daytime unable to look after him and he agrees for a skilled nursing facility  For ADL and eye sight isseus. Barriers to discharge or conditions that needs to be met prior to discharge: Patient was admitted with progressive CKD and critical ischemia of left leg status post bypass surgery, and with worsening renal failure now on new dialysis, in the interim found to be encephalopathic, febrile and with bilateral embolic acute strokes and work-up/management in progress.  Remains hospitalized. Once he is stable will need SNF/CLIP, likely in 3-4 days.  Palliative care consulted given his complex comorbidities.  Consultants: Nephrology, vascular surgery.   Procedures:  3/11: Left femoral to below-knee popliteal bypass with reverse great saphenous vein EG 3/18: This study is suggestive of moderate to severe diffuse encephalopathy, nonspecific etiology. No seizures or definite epileptiform discharges were seen throughout the recording.  Microbiology:see note  Medications: Scheduled Meds: . aspirin EC  81 mg Oral Daily  . aspirin  300 mg Rectal Daily  . atorvastatin  80 mg Oral q1800  . calcium acetate  667 mg Oral TID WC  . Chlorhexidine Gluconate Cloth  6 each Topical Q0600  . darbepoetin (ARANESP) injection - DIALYSIS  100 mcg Intravenous Q Mon-HD  . heparin  5,000 Units Subcutaneous Q8H  . insulin aspart  0-9 Units Subcutaneous Q4H  . multivitamin  1 tablet Oral QHS  . mupirocin ointment  1 application Nasal BID  . sodium chloride flush  3 mL Intravenous Q12H  . ticagrelor  90 mg Oral  BID  . vancomycin  500 mg Intravenous Q M,W,F-HD   Continuous Infusions: . sodium chloride 10 mL/hr at 01/02/20 0916  . ceFEPime (MAXIPIME) IV 1 g (01/08/20 2022)    Antimicrobials: Anti-infectives (From admission, onward)   Start     Dose/Rate  Route Frequency Ordered Stop   01/08/20 2000  ceFEPIme (MAXIPIME) 1 g in sodium chloride 0.9 % 100 mL IVPB     1 g 200 mL/hr over 30 Minutes Intravenous Every 24 hours 01/07/20 0858     01/07/20 1200  vancomycin (VANCOCIN) IVPB 500 mg/100 ml premix     500 mg 100 mL/hr over 60 Minutes Intravenous Every M-W-F (Hemodialysis) 01/07/20 0858     01/07/20 1000  vancomycin (VANCOREADY) IVPB 1250 mg/250 mL     1,250 mg 166.7 mL/hr over 90 Minutes Intravenous To Hemodialysis 01/07/20 0856 01/07/20 1058   01/07/20 1000  ceFEPIme (MAXIPIME) 1 g in sodium chloride 0.9 % 100 mL IVPB     1 g 200 mL/hr over 30 Minutes Intravenous  Once 01/07/20 0856 01/07/20 1252   01/06/20 1130  doxycycline (VIBRA-TABS) tablet 100 mg  Status:  Discontinued     100 mg Oral Every 12 hours 01/06/20 1045 01/07/20 0838   01/05/20 0000  ceFAZolin (ANCEF) IVPB 1 g/50 mL premix    Note to Pharmacy: Send with pt to OR   1 g 100 mL/hr over 30 Minutes Intravenous On call 01/04/20 1134 01/05/20 0735   01/02/20 0927  ceFAZolin (ANCEF) 2-4 GM/100ML-% IVPB    Note to Pharmacy: [name]   : cabinet override      01/02/20 0927 01/02/20 0945   01/01/20 1600  ceFAZolin (ANCEF) IVPB 2g/100 mL premix     2 g 200 mL/hr over 30 Minutes Intravenous Every 8 hours 01/01/20 1224 01/02/20 0125   01/01/20 0706  ceFAZolin (ANCEF) 2-4 GM/100ML-% IVPB    Note to Pharmacy: Lelon Perla   : cabinet override      01/01/20 0706 01/01/20 1914   12/31/19 1745  ceFAZolin (ANCEF) IVPB 2g/100 mL premix     2 g 200 mL/hr over 30 Minutes Intravenous On call to O.R. 12/31/19 1730 01/01/20 0559       Objective: Vitals: Today's Vitals   01/09/20 0820 01/09/20 0900 01/09/20 1000 01/09/20 1100  BP: 134/67 (!) 147/66 136/69 135/66  Pulse: 98  93 93  Resp: _0 Temp: 98.4 F (36.9 C)   98.6 F (37 C)  TempSrc: Oral   Oral  SpO2: 100%  100% 100%  Weight:      Height:      PainSc:        Intake/Output Summary (Last 24 hours) at  01/09/2020 1123 Last data filed at 01/09/2020 0617 Gross per 24 hour  Intake --  Output 1 ml  Net -1 ml   Filed Weights   01/07/20 1018 01/08/20 0416 01/09/20 0413  Weight: 54.1 kg 53.4 kg 56 kg   Weight change: 1.9 kg   Intake/Output from previous day: 03/18 0701 - 03/19 0700 In: -  Out: 1 [Stool:1] Intake/Output this shift: No intake/output data recorded.  Examination:  General exam: Alert awake, does not follow instruction, confused, not in acute distress.  On room air.   HEENT:Oral mucosa moist, Ear/Nose WNL grossly,dentition normal. Respiratory system: b/l clear, no crackles, no use of accessory muscles.  \ Cardiovascular system: S1 & S2 +, regular, No JVD. Gastrointestinal system: Abdomen soft, NT,ND, BS+. Nervous System: Confused,not following  commands moving all his extremities spontaneously.  Extremities: Mild erythema and edema on the left leg, 3-5 toes with blackish discoloration surgical site healing.   Skin: No rashes,no icterus. MSK: Normal muscle bulk,tone,    Data Reviewed: I have personally reviewed following labs and imaging studies CBC: Recent Labs  Lab 01/05/20 0235 01/06/20 0831 01/07/20 0412 01/08/20 0228 01/09/20 0239  WBC 17.0* 19.1* 22.8* 33.5* 35.2*  HGB 8.1* 8.7* 7.7* 7.7* 8.0*  HCT 25.9* 29.1* 25.7* 24.9* 26.0*  MCV 91.8 93.6 92.4 91.5 91.5  PLT 531* 610* 529* 524* 027*   Basic Metabolic Panel: Recent Labs  Lab 01/05/20 0235 01/06/20 0133 01/07/20 0412 01/08/20 0228 01/09/20 0239  NA 141 140 139 135 138  K 4.2 4.3 4.5 4.5 4.3  CL 106 99 97* 97* 99  CO2 _0 19* 19*  GLUCOSE 103* 88 139* 216* 163*  BUN 31* 19 43* 35* 58*  CREATININE 4.72* 3.77* 6.27* 4.80* 6.36*  CALCIUM 8.6* 8.1* 8.2* 7.7* 8.2*  PHOS 3.6 4.1 5.1* 5.7* 5.1*   GFR: Estimated Creatinine Clearance: 11.3 mL/min (A) (by C-G formula based on SCr of 6.36 mg/dL (H)). Liver Function Tests: Recent Labs  Lab 01/05/20 0235 01/06/20 0133 01/07/20 0412  01/08/20 0228 01/09/20 0239  ALBUMIN 1.8* 1.7* 1.5* 1.4* 1.4*   No results for input(s): LIPASE, AMYLASE in the last 168 hours. No results for input(s): AMMONIA in the last 168 hours. Coagulation Profile: Recent Labs  Lab 01/05/20 0235  INR 1.0   Cardiac Enzymes: No results for input(s): CKTOTAL, CKMB, CKMBINDEX, TROPONINI in the last 168 hours. BNP (last 3 results) No results for input(s): PROBNP in the last 8760 hours. HbA1C: Recent Labs    01/07/20 1855  HGBA1C 7.5*   CBG: Recent Labs  Lab 01/08/20 1820 01/08/20 2030 01/09/20 0021 01/09/20 0411 01/09/20 0813  GLUCAP 168* 148* 157* 139* 185*   Lipid Profile: Recent Labs    01/08/20 0228  CHOL 76  HDL 18*  LDLCALC 28  TRIG 151*  CHOLHDL 4.2   Thyroid Function Tests: No results for input(s): TSH, T4TOTAL, FREET4, T3FREE, THYROIDAB in the last 72 hours. Anemia Panel: No results for input(s): VITAMINB12, FOLATE, FERRITIN, TIBC, IRON, RETICCTPCT in the last 72 hours. Sepsis Labs: Recent Labs  Lab 01/07/20 1603 01/07/20 1855  LATICACIDVEN 1.1 0.9    Recent Results (from the past 240 hour(s))  Respiratory Panel by RT PCR (Flu A&B, Covid) - Nasopharyngeal Swab     Status: None   Collection Time: 12/31/19  5:23 PM   Specimen: Nasopharyngeal Swab  Result Value Ref Range Status   SARS Coronavirus 2 by RT PCR NEGATIVE NEGATIVE Final    Comment: (NOTE) SARS-CoV-2 target nucleic acids are NOT DETECTED. The SARS-CoV-2 RNA is generally detectable in upper respiratoy specimens during the acute phase of infection. The lowest concentration of SARS-CoV-2 viral copies this assay can detect is 131 copies/mL. A negative result does not preclude SARS-Cov-2 infection and should not be used as the sole basis for treatment or other patient management decisions. A negative result may occur with  improper specimen collection/handling, submission of specimen other than nasopharyngeal swab, presence of viral mutation(s)  within the areas targeted by this assay, and inadequate number of viral copies (<131 copies/mL). A negative result must be combined with clinical observations, patient history, and epidemiological information. The expected result is Negative. Fact Sheet for Patients:  PinkCheek.be Fact Sheet for Healthcare Providers:  GravelBags.it This test is not yet ap proved  or cleared by the [place] and  has been authorized for detection and/or diagnosis of SARS-CoV-2 by FDA under an Emergency Use Authorization (EUA). This EUA will remain  in effect (meaning this test can be used) for the duration of the COVID-19 declaration under Section 564(b)(1) of the Act, 21 U.S.C. section 360bbb-3(b)(1), unless the authorization is terminated or revoked sooner.    Influenza A by PCR NEGATIVE NEGATIVE Final   Influenza B by PCR NEGATIVE NEGATIVE Final    Comment: (NOTE) The Xpert Xpress SARS-CoV-2/FLU/RSV assay is intended as an aid in  the diagnosis of influenza from Nasopharyngeal swab specimens and  should not be used as a sole basis for treatment. Nasal washings and  aspirates are unacceptable for Xpert Xpress SARS-CoV-2/FLU/RSV  testing. Fact Sheet for Patients: PinkCheek.be Fact Sheet for Healthcare Providers: GravelBags.it This test is not yet approved or cleared by the [place] FDA and  has been authorized for detection and/or diagnosis of SARS-CoV-2 by  FDA under an Emergency Use Authorization (EUA). This EUA will remain  in effect (meaning this test can be used) for the duration of the  Covid-19 declaration under Section 564(b)(1) of the Act, 21  U.S.C. section 360bbb-3(b)(1), unless the authorization is  terminated or revoked. Performed at [facility], [place] [address], [place], [place] [address]   Surgical pcr screen     Status: Abnormal   Collection Time:  01/01/20  4:27 AM   Specimen: Nasal Mucosa; Nasal Swab  Result Value Ref Range Status   MRSA, PCR NEGATIVE NEGATIVE Final   Staphylococcus aureus POSITIVE (A) NEGATIVE Final    Comment: (NOTE) The Xpert SA Assay (FDA approved for NASAL specimens in patients 49 years of age and older), is one component of a comprehensive surveillance program. It is not intended to diagnose infection nor to guide or monitor treatment. Performed at [facility], [place] [address], [place], [place] [address]   Culture, blood (routine x 2)     Status: None (Preliminary result)   Collection Time: 01/06/20 10:55 AM   Specimen: BLOOD  Result Value Ref Range Status   Specimen Description BLOOD RIGHT ANTECUBITAL  Final   Special Requests   Final    AEROBIC BOTTLE ONLY Blood Culture results may not be optimal due to an inadequate volume of blood received in culture bottles   Culture   Final    NO GROWTH 3 DAYS Performed at [facility], [place] [address], [place], [place] [address]    Report Status PENDING  Incomplete  Culture, blood (routine x 2)     Status: None (Preliminary result)   Collection Time: 01/06/20 12:17 PM   Specimen: BLOOD  Result Value Ref Range Status   Specimen Description BLOOD RIGHT ANTECUBITAL  Final   Special Requests   Final    AEROBIC BOTTLE ONLY Blood Culture results may not be optimal due to an inadequate volume of blood received in culture bottles   Culture   Final    NO GROWTH 3 DAYS Performed at [facility], [place] [address], [place], [place] [address]    Report Status PENDING  Incomplete  Culture, blood (routine x 2)     Status: None (Preliminary result)   Collection Time: 01/07/20 11:45 AM   Specimen: BLOOD  Result Value Ref Range Status   Specimen Description BLOOD HEMODIALYSIS CATHETER VENOUS  Final   Special Requests   Final    BOTTLES DRAWN AEROBIC ONLY  Blood Culture adequate volume   Culture   Final    NO GROWTH 2 DAYS Performed at [place], [place] [address], [place], [place] [address]    Report Status PENDING  Incomplete  Culture, blood (routine x 2)     Status: None (Preliminary result)   Collection Time: 01/07/20  4:11 PM   Specimen: BLOOD  Result Value Ref Range Status   Specimen Description BLOOD HEMODIALYSIS CATHETER ARTERIAL  Final   Special Requests   Final    BOTTLES DRAWN AEROBIC AND ANAEROBIC Blood Culture adequate volume   Culture   Final    NO GROWTH 2 DAYS Performed at [facility], [place] [address] [place], [place] [address]    Report Status PENDING  Incomplete  SARS CORONAVIRUS 2 (TAT 6-24 HRS) Nasopharyngeal Nasopharyngeal Swab     Status: None   Collection Time: 01/07/20  4:49 PM   Specimen: Nasopharyngeal Swab  Result Value Ref Range Status   SARS Coronavirus 2 NEGATIVE NEGATIVE Final    Comment: (NOTE) SARS-CoV-2 target nucleic acids are NOT DETECTED. The SARS-CoV-2 RNA is generally detectable in upper and lower respiratory specimens during the acute phase of infection. Negative results do not preclude SARS-CoV-2 infection, do not rule out co-infections with other pathogens, and should not be used as the sole basis for treatment or other patient management decisions. Negative results must be combined with clinical observations, patient history, and epidemiological information. The expected result is Negative. Fact Sheet for Patients: SugarRoll.be Fact Sheet for Healthcare Providers: https://www.woods-mathews.com/ This test is not yet approved or cleared by the [place] FDA and  has been authorized for detection and/or diagnosis of SARS-CoV-2 by FDA under an Emergency Use Authorization (EUA). This EUA will remain  in effect (meaning this test can be used) for the duration of the COVID-19 declaration under Section 56 4(b)(1) of the Act, 21 U.S.C. section 360bbb-3(b)(1), unless the authorization is terminated or revoked sooner. Performed at [facility], [place] [address], [place], [place] [address]       Radiology Studies: MR BRAIN WO CONTRAST  Result Date: 01/07/2020 CLINICAL DATA:  Left-sided weakness, code stroke follow-up EXAM: MRI HEAD WITHOUT CONTRAST TECHNIQUE: Multiplanar, multiecho pulse sequences of the brain and surrounding structures were obtained without intravenous contrast. COMPARISON:  11/14/2019 FINDINGS: Brain: There are numerous small foci of restricted diffusion involving multiple vascular territories bilaterally. Multiple chronic small infarcts are identified including involvement of the central white matter, basal ganglia, thalamus, and cerebellum. There is wallerian degeneration along the right cerebral peduncle. Foci of susceptibility likely reflecting chronic blood products are again identified in association with several of these chronic infarcts. There is no intracranial mass, mass effect, or edema. Ventricles are stable in size. There is no hydrocephalus or extra-axial fluid collection. Vascular: Major vessel flow voids at the skull base are preserved. Skull and upper cervical spine: Normal marrow signal is preserved. Sinuses/Orbits: Paranasal sinuses are aerated. Orbits are unremarkable. Other: Sella is unremarkable. Mastoid air cells are clear. Right posterior scalp lipoma. IMPRESSION: Numerous small acute infarctions involving bilateral anterior and posterior circulations. Multiple chronic infarcts. Electronically Signed   By: Macy Mis M.D.   On: 01/07/2020 15:13   EEG adult  Result Date: 01/08/2020 Lora Havens, MD     01/08/2020 12:15 PM Patient Name: SQUIRE WITHEY MRN: [id number] Epilepsy Attending: Lora Havens Referring Physician/Provider: Dr. Amie [place] Date: 01/07/2020 Duration: 22.52 minutes Patient history: 49 year old male with renal  failure on hemodialysis with worsening encephalopathy.  EEG to evaluate for seizures. Level of alertness: Lethargic AEDs during EEG study: None Technical  aspects: This EEG study was done with scalp electrodes positioned according to the 10-20 International system of electrode placement. Electrical activity was acquired at a sampling rate of _0  and reviewed with a high frequency filter of _1  and a low frequency filter of _2 . EEG data were recorded continuously and digitally stored. Description: No clear posterior dominant was seen.  EEG showed continuous generalized polymorphic 3 to 6 Hz theta-delta slowing.  Hyperventilation photic summation were not performed. Abnormality - Continuous slow, generalized. IMPRESSION: This study is suggestive of moderate to severe diffuse encephalopathy, nonspecific etiology. No seizures or definite epileptiform discharges were seen throughout the recording. Priyanka O Yadav   VAS [place] LOWER EXTREMITY VENOUS (DVT)  Result Date: 01/07/2020  Lower Venous DVTStudy Indications: Edema.  Limitations: Open wound and poor ultrasound/tissue interface. Comparison Study: LEV 08-12-19, negative. Performing Technologist: Baldwin Crown ARDMS, RVT  Examination Guidelines: A complete evaluation includes B-mode imaging, spectral Doppler, color Doppler, and power Doppler as needed of all accessible portions of each vessel. Bilateral testing is considered an integral part of a complete examination. Limited examinations for reoccurring indications may be performed as noted. The reflux portion of the exam is performed with the patient in reverse Trendelenburg.  +-----+---------------+---------+-----------+----------+--------------+ RIGHTCompressibilityPhasicitySpontaneityPropertiesThrombus Aging +-----+---------------+---------+-----------+----------+--------------+ CFV  Full           Yes      Yes                                 +-----+---------------+---------+-----------+----------+--------------+   +---------+---------------+---------+-----------+----------+-------------------+ LEFT      CompressibilityPhasicitySpontaneityPropertiesThrombus Aging      +---------+---------------+---------+-----------+----------+-------------------+ CFV      Full           No       Yes                                      +---------+---------------+---------+-----------+----------+-------------------+ SFJ                                                   [place]vest for bypass? +---------+---------------+---------+-----------+----------+-------------------+ FV Prox  Full                                                             +---------+---------------+---------+-----------+----------+-------------------+ FV Mid   Full                                                             +---------+---------------+---------+-----------+----------+-------------------+ FV DistalFull                                                             +---------+---------------+---------+-----------+----------+-------------------+  PFV      Full                                                             +---------+---------------+---------+-----------+----------+-------------------+ POP      Full           No       Yes                                      +---------+---------------+---------+-----------+----------+-------------------+ PTV      Full                                         poorly visualized   +---------+---------------+---------+-----------+----------+-------------------+ PERO     Full                                         poorly visualized   +---------+---------------+---------+-----------+----------+-------------------+ Multiple hypoechoic, oval structures with echogenic centers seen in left groin, likely lymph nodes. Largest measuring 2.4 x 1.1 x 1.4 cm. Anechoic structure with no blood flow seen in left groin, hematoma vs seroma, measures 3.4 x 1.3 x 2.2 cm.   Summary: RIGHT: - No evidence of common femoral vein obstruction.  LEFT: - There is no  evidence of deep vein thrombosis in the lower extremity. All veins examined are compressible, however there appears to be continuous flow in CFV and popliteal vein. Portions of this examination were limited- see technologist comments above.  - No cystic structure found in the popliteal fossa.  - Multiple hypoechoic, oval structures with echogenic centers seen in left groin, likely lymph nodes. Largest measuring 2.4 x 1.1 x 1.4 cm. Anechoic structure with no blood flow seen in left groin, hematoma vs seroma, measures 3.4 x 1.3 x 2.2 cm.  *See table(s) above for measurements and observations. Electronically signed by Curt Jews MD on 01/07/2020 at 3:48:56 PM.    Final      LOS: 9 days   Time spent: More than 50% of that time was spent in counseling and/or coordination of care.  Antonieta Pert, MD [facility]  01/09/2020, 11:23 AM

## 2020-01-10 ENCOUNTER — Inpatient Hospital Stay (HOSPITAL_COMMUNITY): Payer: 59

## 2020-01-10 DIAGNOSIS — Z515 Encounter for palliative care: Secondary | ICD-10-CM

## 2020-01-10 DIAGNOSIS — L8995 Pressure ulcer of unspecified site, unstageable: Secondary | ICD-10-CM

## 2020-01-10 DIAGNOSIS — I6389 Other cerebral infarction: Secondary | ICD-10-CM

## 2020-01-10 DIAGNOSIS — Z7189 Other specified counseling: Secondary | ICD-10-CM

## 2020-01-10 LAB — CBC
HCT: 28 % — ABNORMAL LOW (ref 39.0–52.0)
Hemoglobin: 8.6 g/dL — ABNORMAL LOW (ref 13.0–17.0)
MCH: 28 pg (ref 26.0–34.0)
MCHC: 30.7 g/dL (ref 30.0–36.0)
MCV: 91.2 fL (ref 80.0–100.0)
Platelets: 630 10*3/uL — ABNORMAL HIGH (ref 150–400)
RBC: 3.07 MIL/uL — ABNORMAL LOW (ref 4.22–5.81)
RDW: 14.9 % (ref 11.5–15.5)
WBC: 32.6 10*3/uL — ABNORMAL HIGH (ref 4.0–10.5)
nRBC: 0.1 % (ref 0.0–0.2)

## 2020-01-10 LAB — SAVE SMEAR(SSMR), FOR PROVIDER SLIDE REVIEW

## 2020-01-10 LAB — RENAL FUNCTION PANEL
Albumin: 1.4 g/dL — ABNORMAL LOW (ref 3.5–5.0)
Anion gap: 21 — ABNORMAL HIGH (ref 5–15)
BUN: 39 mg/dL — ABNORMAL HIGH (ref 6–20)
CO2: 21 mmol/L — ABNORMAL LOW (ref 22–32)
Calcium: 7.8 mg/dL — ABNORMAL LOW (ref 8.9–10.3)
Chloride: 96 mmol/L — ABNORMAL LOW (ref 98–111)
Creatinine, Ser: 4.45 mg/dL — ABNORMAL HIGH (ref 0.61–1.24)
GFR calc Af Amer: 17 mL/min — ABNORMAL LOW (ref >60)
GFR calc non Af Amer: 15 mL/min — ABNORMAL LOW (ref >60)
Glucose, Bld: 256 mg/dL — ABNORMAL HIGH (ref 70–99)
Phosphorus: 4.3 mg/dL (ref 2.5–4.6)
Potassium: 3.3 mmol/L — ABNORMAL LOW (ref 3.5–5.1)
Sodium: 138 mmol/L (ref 135–145)

## 2020-01-10 LAB — TECHNOLOGIST SMEAR REVIEW: Plt Morphology: INCREASED

## 2020-01-10 LAB — GLUCOSE, CAPILLARY
Glucose-Capillary: 129 mg/dL — ABNORMAL HIGH (ref 70–99)
Glucose-Capillary: 145 mg/dL — ABNORMAL HIGH (ref 70–99)
Glucose-Capillary: 161 mg/dL — ABNORMAL HIGH (ref 70–99)
Glucose-Capillary: 171 mg/dL — ABNORMAL HIGH (ref 70–99)
Glucose-Capillary: 176 mg/dL — ABNORMAL HIGH (ref 70–99)
Glucose-Capillary: 216 mg/dL — ABNORMAL HIGH (ref 70–99)
Glucose-Capillary: 223 mg/dL — ABNORMAL HIGH (ref 70–99)

## 2020-01-10 LAB — PHOSPHORUS: Phosphorus: 4.1 mg/dL (ref 2.5–4.6)

## 2020-01-10 LAB — MAGNESIUM
Magnesium: 2.3 mg/dL (ref 1.7–2.4)
Magnesium: 2.5 mg/dL — ABNORMAL HIGH (ref 1.7–2.4)

## 2020-01-10 MED ORDER — ASPIRIN 300 MG RE SUPP
300.0000 mg | Freq: Every day | RECTAL | Status: DC
  Administered 2020-01-10 – 2020-01-13 (×4): 300 mg via RECTAL
  Filled 2020-01-10 (×4): qty 1

## 2020-01-10 NOTE — Progress Notes (Signed)
  Echocardiogram 2D Echocardiogram has been performed.  Phillip Camacho 01/10/2020, 2:03 PM

## 2020-01-10 NOTE — Progress Notes (Addendum)
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  Last hospitalized in January 2021 for a stroke since then has been confused, not been eating or drinking, needing supervision with ADLs. In the ED found to have potassium 6.6, bicarbonate 17, BUN 50, and creatinine 5.69, up from 4.8 in January 2021. CBC notable for leukocytosis to 14,500, stable normocytic anemia, and increased thrombocytosis with platelets of 748,000. INR is normal. Covid and influenza PCR are negative.   Patient received temporizing measures with insulin/dextrose, bicarbonate, and Lokelma, nephrology/vascular surgery consulted, patient was admitted for critical left leg ischemia and hyperkalemia and renal failure. 3/11-left femoral to below-knee popliteal bypass.  Patient seen by nephrology and also started on dialysis after placing TDC.  Patient had episode of some weakness and had CT scan that showed abnormal hypoattenuation and underwent MRI and during that time he has been more confused intermittently febrile.  MRI came back with bilateral multiple strokes.  Followed by neurology. 3/17-patient more confused encephalopathy with worsening leukocytosis and episodic fever: Blood cultures return from dialysis catheter and dialysis and placed on vancomycin and Zosyn.   Assessment & Plan:   Principal Problem:   Hyperkalemia Active Problems:   Acute renal failure superimposed on stage 4 chronic kidney disease (HCC)   Insulin-requiring or dependent type II diabetes mellitus ([place])   Hypertension   Pressure injury of skin   Progressive CKD stage 4 due to poorly controlled diabetes and hypertension: nephro on board, work-up negative so far with  negative HIV, hep C, hep B.  Holding home losartan. status post tunnel dialysis catheter placement and initiation of dialysis 3/12, AV fistula placed 3/15. Cont HD per nephro and will need clip and skilled nursing facility placement.   Critical ischemia of left lower extremity, 3-5 left toes tip blackish:  S/p Left femoral to below-knee popliteal bypass with reverse great saphenous vein 3/11.  Discussed with vasc 2/2 loss of cortrac,  Continue aspirin, Brilinta and Lipitor- once re=establsied- Left leg erythematous and  Lt 3-5 toes are blackish and getting demarcated being followed by vascular-will need some degree of amputation per vascular. No DVT in lower extremities.    Sepsis with fever/acute encephalopathy: Patient had an intermittent temperature spike, COVID-19 - on 3/17.  Chest x-ray 3/17 no acute finding, blood culture from 3/16 and 3/17-still no growth so far.  Now no more fever episode.Continue empiric vancomycin/cefepime (started 3/17).He has RT IJ HD line in place-no evidence of infection.  Leukocytosis: Suspect multifactorial in the setting of fever/sepsis and also contributed by leg ischemia- LT 3-5 TOES now ischemic/gangrenous.    Acute encephalopathy likely multifactorial, metabolic and in relation to bilateral multiple strokes seen on MRI. he had lethargy with Pupillary asymmetry ( chronic). First noted 3/16 am after bath - code stroke activated 3/16- seen by neuro-CT head area of hypoattenuation in the subcortical white matter with probable extension to the cortex in the right frontal operculum, remote infarcts, seen by neurologist and MRI ordered-unable to be completed at he was moving but see below for repeat attempt.  Still appears lethargic this morning-monitor closely.   Multiple acute embolic strokes seen on MRI 3/17, with history of multiple prior strokes: Appreciate neurology input. MRI of the brain (  01/07/2020): Bilateral small acute infarcts in multiple vascular territorie.  MRA head(11/15/2019)- Moderate intracranial atherosclerotic diseaseincluding bilateral terminal ICAs and MCAs.MRA neck: negative(11/15/2019).EEG with diffuse encephalopathy. Repeat TTE limited 2/2 pt non-compliance-did note mod effusion, discuss with cards for consult and possible TEE- deferred to neruo for further workup/management.  LDL28, HgbA1c7..5 Currently smoker will need cessation counseling. currently on Lipitor 80, Zetia added last admission.  Continue risk factor modification . Spoke with neuro  2/2 loss of cortrak and inability to give asa/ brilinta, will use asa per rectum for now, cortrak on monday  History recurrent CVAs:Patient was admitted in January with recurrent CVA, likely embolic, has had increased weakness on the left and cognitive deficits since then. Cont home Brilinta, aspirin and Lipitor pending neuro recs-as above lost cortrak and awaiting neuro recs  Severe Hyperkalemia: resolved Metabolic acidosis from renal dysfunction-resolved Nephrotic syndrome presented to diabetic nephropathy.  Monitor.  Insulin-requiring or dependent type II diabetes mellitus, hemoglobin A1c 7.3: 3/17.Blood sugar stable, cont  ssi and monitor.    Hypertension,controlled.  Transient episode of hypotension 3/17 afternoon, holding his home Norvasc in the setting of sepsis.  Allow permissive hypertension in the setting of acute stroke  HLD:on statins.  Tobacco DHR:CBUL need to advise cessation.  Anemia of CKD-hemoglobin is stable, transfuse for less than 7 g.    Social issues:Patient lives with his son who works during the day and has had difficulty caring for the patient who has been frequently confused since the recent strokes and is often unable to perform basic ADLs on his own.  Spoke with son today 01/10/2020 regarding concern for poor prognosis and loss of ability to anticoagulate with cortrak loss, discussed palliative care-orderr is pending, still wants full care  Nutrition -  tube feed and meds via ngt once tube in  Body mass index is 20.54 kg/m.   DVT prophylaxis: SCD/Compression stockings  Code Status: full    Code Status Orders  (From admission, onward)         Start     Ordered   01/01/20 1225  Full code  Continuous     01/01/20 1224        Code Status History    Date Active Date Inactive Code Status Order ID Comments User Context   12/31/2019 2154 01/01/2020 1224 Full Code [id number]  Vianne Bulls, MD Inpatient   11/14/2019 1656 11/18/2019 2047 Full Code [id number]  Damita Lack, MD ED   09/30/2019 1126 10/02/2019 2131 Full Code [id number]  [name] Roan, MD ED   08/11/2019 2040 08/14/2019 1737 Full Code [id number]  Alma Friendly, MD ED   Advance Care Planning Activity     Family Communication: discussed with son  Disposition Plan:   Patient is from:Home Anticipated Disposition:PT recommends SNF.  Son is at work during daytime unable to look after him and he agrees for a skilled nursing facility  For ADL and eye sight isseus. Barriers to discharge or conditions that needs to be met prior to discharge: Patient was admitted with progressive CKD and critical ischemia of left leg status post bypass surgery, and with worsening renal failure now on new dialysis, in the interim found to be encephalopathic, febrile and with bilateral embolic acute strokes and work-up/management in [place].  Remains hospitalized. Once he is stable will need SNF/CLIP, likely in 3-4 days.  Palliative care consulted given his complex comorbidities Consults called: Vascular surgery and neurology Admission status: Inpatient   Consultants:   vasc surg, neuro  Procedures:  MR BRAIN WO CONTRAST  Result Date: 01/07/2020 CLINICAL DATA:  Left-sided weakness, code stroke follow-up EXAM: MRI HEAD WITHOUT CONTRAST TECHNIQUE: Multiplanar, multiecho pulse sequences of the brain and surrounding structures were obtained without intravenous contrast. COMPARISON:   11/14/2019 FINDINGS: Brain: There are numerous small foci of restricted diffusion involving multiple vascular territories bilaterally. Multiple chronic small infarcts are identified including involvement of the central white matter, basal ganglia, thalamus, and cerebellum. There is wallerian degeneration along the right cerebral peduncle. Foci of susceptibility likely reflecting chronic blood products are again identified in association with several of these chronic infarcts. There is no intracranial mass, mass effect, or edema. Ventricles are stable in size. There is no hydrocephalus or extra-axial fluid collection. Vascular: Major vessel flow voids at the skull base are preserved. Skull and upper cervical spine: Normal marrow signal is preserved. Sinuses/Orbits: Paranasal sinuses are aerated. Orbits are unremarkable. Other: Sella is unremarkable. Mastoid air cells are clear. Right posterior scalp lipoma. IMPRESSION: Numerous small acute infarctions involving bilateral anterior and posterior circulations. Multiple chronic infarcts. Electronically Signed   By: Macy Mis M.D.   On: 01/07/2020 15:13   US Abdomen Complete  Result Date: 01/10/2020 CLINICAL DATA:  Acute renal injury. Groin lymphadenopathy. Ordering physician concern for abdominal malignancy. EXAM: ABDOMEN ULTRASOUND COMPLETE COMPARISON:  None. FINDINGS: Gallbladder: The gallbladder wall measures 4.8 mm. No stones. Sludge is identified. No Murphy's sign identified. There is pericholecystic fluid. Common bile duct: Diameter: 3.7 mm Liver: No focal lesion identified. Within normal limits in parenchymal echogenicity. Portal vein is patent on color Doppler imaging with normal direction of blood flow towards the liver. IVC: No abnormality visualized. Pancreas: Not visualized. Spleen: Size and appearance within normal limits. Right Kidney: Length: 9.9 cm. Increased echogenicity. No focal mass or hydronephrosis. Left Kidney: Length: 11.7 cm. Increased  echogenicity. No focal mass or hydronephrosis. Abdominal aorta: The mid and distal abdominal aorta was not well visualized due to shadowing bowel gas. No aneurysm identified. Other findings: None. IMPRESSION: 1. There is gallbladder wall thickening with sludge and pericholecystic fluid. No stones or Murphy's sign. If there is concern for acute cholecystitis, recommend HIDA scan. 2. The pancreas was not visualized due to shadowing bowel gas. 3. Medical renal disease. 4. No other abnormalities are identified. Electronically Signed   By: Dorise Bullion III M.D   On: 01/10/2020 10:56   DG CHEST PORT 1 VIEW  Result Date: 01/07/2020 CLINICAL DATA:  49 year old male with history of fever. Hemodialysis patient. EXAM: PORTABLE CHEST 1 VIEW COMPARISON:  Chest x-ray 01/02/2020. FINDINGS: Right internal jugular PermCath with tip terminating at the superior cavoatrial junction. Lung volumes are normal. Linear scarring in the left lower lung. No consolidative airspace disease. No pleural effusions. No pneumothorax. No pulmonary nodule or mass noted. Pulmonary vasculature and the cardiomediastinal silhouette are within normal limits. Electronic device projecting over the left side of the heart, presumably an implantable loop recorder. IMPRESSION: 1. Low lung volumes without radiographic evidence of acute cardiopulmonary disease. Electronically Signed   By: Vinnie Langton M.D.   On: 01/07/2020 08:50   DG CHEST PORT 1 VIEW  Result Date: 01/02/2020 CLINICAL DATA:  Renal failure; post-op dialysis catheter placement. Hx of diabetes, stroke, loop recorder insertion(07/2019), TEE w/o cardioversion(07/2019). Smoker. EXAM: PORTABLE CHEST 1 VIEW COMPARISON:  11/14/2019 FINDINGS: Right dialysis catheter tip: Right atrium. Loop recorder noted. Low lung volumes are present, causing crowding of the pulmonary vasculature. Borderline enlargement of the cardiopericardial silhouette. The lungs appear clear. No blunting of the  costophrenic angles. IMPRESSION: 1. Right dialysis catheter tip: Right atrium.  No pneumothorax. 2. Borderline enlargement of the cardiopericardial silhouette. Electronically Signed   By: Van Clines M.D.   On: 01/02/2020 11:54   EEG adult  Result Date: 01/08/2020 Lora Havens, MD     01/08/2020 12:15 PM Patient Name: PHILIPE LASWELL MRN: [id number] Epilepsy Attending: Lora Havens Referring Physician/Provider: Dr. Amie [place] Date: 01/07/2020 Duration: 22.52 minutes Patient history: 49 year old male with renal failure on hemodialysis with worsening encephalopathy.  EEG to evaluate for seizures. Level of alertness: Lethargic AEDs during EEG study: None Technical aspects: This EEG study was done with scalp electrodes positioned according to the 10-20 International system of electrode placement. Electrical activity was acquired at a sampling rate of 500Hz  and reviewed with a high frequency filter of 70Hz  and a low frequency filter of 1Hz . EEG data were recorded continuously and digitally stored. Description: No clear posterior dominant was seen.  EEG showed continuous generalized polymorphic 3 to 6 Hz theta-delta slowing.  Hyperventilation photic summation were not performed. Abnormality - Continuous slow, generalized. IMPRESSION: This study is suggestive of moderate to severe diffuse encephalopathy, nonspecific etiology. No seizures or definite epileptiform discharges were seen throughout the recording. Priyanka Barbra Sarks   DG Fluoro Guide CV Line-No Report  Result Date: 01/02/2020 Fluoroscopy was utilized by the requesting physician.  No radiographic interpretation.   VAS [place] ABI WITH/WO TBI  Result Date: 01/03/2020 LOWER EXTREMITY DOPPLER STUDY Indications: Rest pain. High Risk Factors: Hypertension, Diabetes, prior CVA. Other Factors: CKD,.  Vascular Interventions: S/P Left leg femoral to below knee popliteal bypass. Comparison Study: No prior Performing Technologist: Sharion Dove RVS   Examination Guidelines: A complete evaluation includes at minimum, Doppler waveform signals and systolic blood pressure reading at the level of bilateral brachial, anterior tibial, and posterior tibial arteries, when vessel segments are accessible. Bilateral testing is considered an integral part of a complete examination. Photoelectric Plethysmograph (PPG) waveforms and toe systolic pressure readings are included as required and additional duplex testing as needed. Limited examinations for reoccurring indications may be performed as noted.  ABI Findings: +---------+------------------+-----+---------+--------+ Right    Rt Pressure (mmHg)IndexWaveform Comment  +---------+------------------+-----+---------+--------+ Brachial 179                    triphasic         +---------+------------------+-----+---------+--------+ PTA      117               0.65                   +---------+------------------+-----+---------+--------+ DP       109               0.60                   +---------+------------------+-----+---------+--------+ Great Toe31                0.17                   +---------+------------------+-----+---------+--------+ +---------+------------------+-----+---------+-------+ Left     Lt Pressure (mmHg)IndexWaveform Comment +---------+------------------+-----+---------+-------+ Brachial 181                    triphasic        +---------+------------------+-----+---------+-------+ PTA      74                0.41                  +---------+------------------+-----+---------+-------+  DP       81                0.45                  +---------+------------------+-----+---------+-------+ Great Toe22                0.12                  +---------+------------------+-----+---------+-------+ +-------+-----------+-----------+------------+------------+ ABI/TBIToday's ABIToday's TBIPrevious ABIPrevious TBI  +-------+-----------+-----------+------------+------------+ Right  0.65       0.17                                +-------+-----------+-----------+------------+------------+ Left   0.45       0.12                                +-------+-----------+-----------+------------+------------+  Summary: Right: Resting right ankle-brachial index indicates moderate right lower extremity arterial disease. The right toe-brachial index is abnormal. Left: Resting left ankle-brachial index indicates severe left lower extremity arterial disease. The left toe-brachial index is abnormal.  *See table(s) above for measurements and observations.  Electronically signed by Harold Barban MD on 01/03/2020 at 7:05:55 PM.    Final    CUP PACEART REMOTE DEVICE CHECK  Result Date: 12/21/2019 [facility] summary report received. Battery status OK. Normal device function. No new symptom episodes, tachy episodes, brady, or pause episodes. No new AF episodes. Monthly summary reports and ROV/PRN. SChancey  CT HEAD CODE STROKE WO CONTRAST  Result Date: 01/06/2020 CLINICAL DATA:  Code stroke. Acute onset of mental status change. Left-sided weakness and left facial droop. EXAM: CT HEAD WITHOUT CONTRAST TECHNIQUE: Contiguous axial images were obtained from the base of the skull through the vertex without intravenous contrast. COMPARISON:  MR head 11/14/2019 FINDINGS: Brain: An area of faint hypoattenuation is present in the subcortical white matter of the right frontal operculum. There is possible involvement of the cortex. No other focal cortical lesions are present. Remote lacunar infarcts of the basal ganglia and thalami are stable bilaterally. White matter changes of the brainstem are stable. No other acute cortical infarct is present. The ventricles are of normal size. No significant extraaxial fluid collection is present. A remote lacunar infarct of the right cerebellum is again noted. Vascular: Atherosclerotic changes are present  within the cavernous internal carotid arteries bilaterally. No hyperdense vessel is present. Skull: Calvarium is intact. No focal lytic or blastic lesions are present. No significant extracranial soft tissue lesion is present. Sinuses/Orbits: The paranasal sinuses and mastoid air cells are clear. The globes and orbits are within normal limits. ASPECTS ([place] Stroke Program Early CT Score) - Ganglionic level infarction (caudate, lentiform nuclei, internal capsule, insula, M1-M3 cortex): 7/7 - Supraganglionic infarction (M4-M6 cortex): 2/3 Total score (0-10 with 10 being normal): 9/10 IMPRESSION: 1. New area of hypoattenuation in the subcortical white matter with probable extension to the cortex in the right frontal operculum. 2. No other acute cortical infarct. 3. Remote lacunar infarcts of the basal ganglia, thalami, and right cerebellum. 4. ASPECTS is 9/10 The above was relayed via text pager to Dr. Leonel Ramsay on 01/06/2020 at 07:29 . Electronically Signed   By: San Morelle M.D.   On: 01/06/2020 07:29   VAS [place] LOWER EXTREMITY VENOUS (DVT)  Result Date: 01/07/2020  Lower Venous DVTStudy Indications: Edema.  Limitations: Open wound  and poor ultrasound/tissue interface. Comparison Study: LEV 08-12-19, negative. Performing Technologist: Baldwin Crown ARDMS, RVT  Examination Guidelines: A complete evaluation includes B-mode imaging, spectral Doppler, color Doppler, and power Doppler as needed of all accessible portions of each vessel. Bilateral testing is considered an integral part of a complete examination. Limited examinations for reoccurring indications may be performed as noted. The reflux portion of the exam is performed with the patient in reverse Trendelenburg.  +-----+---------------+---------+-----------+----------+--------------+ RIGHTCompressibilityPhasicitySpontaneityPropertiesThrombus Aging +-----+---------------+---------+-----------+----------+--------------+ CFV  Full            Yes      Yes                                 +-----+---------------+---------+-----------+----------+--------------+   +---------+---------------+---------+-----------+----------+-------------------+ LEFT     CompressibilityPhasicitySpontaneityPropertiesThrombus Aging      +---------+---------------+---------+-----------+----------+-------------------+ CFV      Full           No       Yes                                      +---------+---------------+---------+-----------+----------+-------------------+ SFJ                                                   [place]vest for bypass? +---------+---------------+---------+-----------+----------+-------------------+ FV Prox  Full                                                             +---------+---------------+---------+-----------+----------+-------------------+ FV Mid   Full                                                             +---------+---------------+---------+-----------+----------+-------------------+ FV DistalFull                                                             +---------+---------------+---------+-----------+----------+-------------------+ PFV      Full                                                             +---------+---------------+---------+-----------+----------+-------------------+ POP      Full           No       Yes                                      +---------+---------------+---------+-----------+----------+-------------------+ PTV      Full  poorly visualized   +---------+---------------+---------+-----------+----------+-------------------+ PERO     Full                                         poorly visualized   +---------+---------------+---------+-----------+----------+-------------------+ Multiple hypoechoic, oval structures with echogenic centers seen in left groin, likely lymph nodes. Largest measuring 2.4 x  1.1 x 1.4 cm. Anechoic structure with no blood flow seen in left groin, hematoma vs seroma, measures 3.4 x 1.3 x 2.2 cm.   Summary: RIGHT: - No evidence of common femoral vein obstruction.  LEFT: - There is no evidence of deep vein thrombosis in the lower extremity. All veins examined are compressible, however there appears to be continuous flow in CFV and popliteal vein. Portions of this examination were limited- see technologist comments above.  - No cystic structure found in the popliteal fossa.  - Multiple hypoechoic, oval structures with echogenic centers seen in left groin, likely lymph nodes. Largest measuring 2.4 x 1.1 x 1.4 cm. Anechoic structure with no blood flow seen in left groin, hematoma vs seroma, measures 3.4 x 1.3 x 2.2 cm.  *See table(s) above for measurements and observations. Electronically signed by Curt Jews MD on 01/07/2020 at 3:48:56 PM.    Final    VAS [place] UPPER EXT VEIN MAPPING (PRE-OP AVF)  Result Date: 01/03/2020 UPPER EXTREMITY VEIN MAPPING  Indications: Pre-dialysis access. Limitations: bandages/IVs bilaterally Comparison Study: No prior Performing Technologist: Sharion Dove RVS  Examination Guidelines: A complete evaluation includes B-mode imaging, spectral Doppler, color Doppler, and power Doppler as needed of all accessible portions of each vessel. Bilateral testing is considered an integral part of a complete examination. Limited examinations for reoccurring indications may be performed as noted. +-----------------+-------------+----------+--------------+ Right Cephalic   Diameter (cm)Depth (cm)   Findings    +-----------------+-------------+----------+--------------+ Prox upper arm                          not visualized +-----------------+-------------+----------+--------------+ Mid upper arm                           not visualized +-----------------+-------------+----------+--------------+ Dist upper arm                          not visualized  +-----------------+-------------+----------+--------------+ Antecubital fossa    0.27        0.27                  +-----------------+-------------+----------+--------------+ Prox forearm         0.25        0.29                  +-----------------+-------------+----------+--------------+ Mid forearm                             not visualized +-----------------+-------------+----------+--------------+ Wrist                                   not visualized +-----------------+-------------+----------+--------------+ +-----------------+-------------+----------+--------------+ Right Basilic    Diameter (cm)Depth (cm)   Findings    +-----------------+-------------+----------+--------------+ Prox upper arm       0.55        0.52                  +-----------------+-------------+----------+--------------+  Mid upper arm        0.48        0.50                  +-----------------+-------------+----------+--------------+ Dist upper arm       0.51        0.37                  +-----------------+-------------+----------+--------------+ Antecubital fossa    0.55        0.37                  +-----------------+-------------+----------+--------------+ Prox forearm         0.34        0.32     branching    +-----------------+-------------+----------+--------------+ Mid forearm          0.29        0.19                  +-----------------+-------------+----------+--------------+ Wrist                                   not visualized +-----------------+-------------+----------+--------------+ +-----------------+-------------+----------+--------------+ Left Cephalic    Diameter (cm)Depth (cm)   Findings    +-----------------+-------------+----------+--------------+ Prox upper arm       0.17        0.20                  +-----------------+-------------+----------+--------------+ Mid upper arm        0.19        0.26                   +-----------------+-------------+----------+--------------+ Dist upper arm       0.25        0.24                  +-----------------+-------------+----------+--------------+ Antecubital fossa    0.27        0.27                  +-----------------+-------------+----------+--------------+ Prox forearm         0.25        0.29     branching    +-----------------+-------------+----------+--------------+ Mid forearm          0.20        0.25                  +-----------------+-------------+----------+--------------+ Wrist                                   not visualized +-----------------+-------------+----------+--------------+ +-----------------+-------------+----------+---------+ Left Basilic     Diameter (cm)Depth (cm)Findings  +-----------------+-------------+----------+---------+ Prox upper arm       0.34        0.87   branching +-----------------+-------------+----------+---------+ Mid upper arm        0.28        0.62             +-----------------+-------------+----------+---------+ Dist upper arm       0.38        0.43             +-----------------+-------------+----------+---------+ Antecubital fossa    0.40        0.48             +-----------------+-------------+----------+---------+ Prox forearm  0.18        0.36   branching +-----------------+-------------+----------+---------+ Mid forearm          0.11        0.17             +-----------------+-------------+----------+---------+ Wrist                0.11        0.19   branching +-----------------+-------------+----------+---------+ *See table(s) above for measurements and observations.  Diagnosing physician: Harold Barban MD Electronically signed by Harold Barban MD on 01/03/2020 at 7:06:22 PM.    Final      Antimicrobials:   Cefepime/vanc   Subjective: Obtunded, not answering question, son reported baseline confusion at home but is interactive  Objective: Vitals:    01/10/20 1000 01/10/20 1100 01/10/20 1200 01/10/20 1300  BP: (!) 120/58 111/67 127/71 128/61  Pulse: 92 93 91 90  Resp: 17 16 19 19   Temp:  98.3 F (36.8 C)    TempSrc:  Oral    SpO2: 100% 100% 100% 100%  Weight:      Height:        Intake/Output Summary (Last 24 hours) at 01/10/2020 1457 Last data filed at 01/09/2020 1736 Gross per 24 hour  Intake --  Output 437 ml  Net -437 ml   Filed Weights   01/09/20 1430 01/09/20 1736 01/10/20 0241  Weight: 56 kg 52.1 kg 52.8 kg    Examination:  General exam: Confused Respiratory system: Clear to auscultation. Respiratory effort normal. Cardiovascular system: S1 & S2 heard, RRR. No JVD, murmurs, rubs, gallops or clicks. No pedal edema. Gastrointestinal system: Abdomen is nondistended, soft. No organomegaly or masses felt. Normal bowel sounds heard. Central nervous system: Confused, moving all 4 extremities randomly/nonpurposefully. Extremities: Left lower extremity black gangrenous clear demarcation Skin: As above Psychiatry: patient obtunded, unable to answer questions    Data Reviewed: I have personally reviewed following labs and imaging studies  CBC: Recent Labs  Lab 01/06/20 0831 01/07/20 0412 01/08/20 0228 01/09/20 0239 01/10/20 0452  WBC 19.1* 22.8* 33.5* 35.2* 32.6*  HGB 8.7* 7.7* 7.7* 8.0* 8.6*  HCT 29.1* 25.7* 24.9* 26.0* 28.0*  MCV 93.6 92.4 91.5 91.5 91.2  PLT 610* 529* 524* 600* 697*   Basic Metabolic Panel: Recent Labs  Lab 01/06/20 0133 01/06/20 0133 01/07/20 0412 01/07/20 0412 01/08/20 0228 01/09/20 0239 01/09/20 1555 01/09/20 1851 01/10/20 0452  NA 140  --  139  --  135 138  --   --  138  K 4.3  --  4.5  --  4.5 4.3  --   --  3.3*  CL 99  --  97*  --  97* 99  --   --  96*  CO2 22  --  22  --  19* 19*  --   --  21*  GLUCOSE 88  --  139*  --  216* 163*  --   --  256*  BUN 19  --  43*  --  35* 58*  --   --  39*  CREATININE 3.77*  --  6.27*  --  4.80* 6.36*  --   --  4.45*  CALCIUM 8.1*  --   8.2*  --  7.7* 8.2*  --   --  7.8*  MG  --   --   --   --   --   --  2.2 2.1 2.3  PHOS 4.1   < > 5.1*   < >  5.7* 5.1* 2.7 3.2 4.3   < > = values in this interval not displayed.   GFR: Estimated Creatinine Clearance: 15.2 mL/min (A) (by C-G formula based on SCr of 4.45 mg/dL (H)). Liver Function Tests: Recent Labs  Lab 01/06/20 0133 01/07/20 0412 01/08/20 0228 01/09/20 0239 01/10/20 0452  ALBUMIN 1.7* 1.5* 1.4* 1.4* 1.4*   No results for input(s): LIPASE, AMYLASE in the last 168 hours. No results for input(s): AMMONIA in the last 168 hours. Coagulation Profile: Recent Labs  Lab 01/05/20 0235  INR 1.0   Cardiac Enzymes: No results for input(s): CKTOTAL, CKMB, CKMBINDEX, TROPONINI in the last 168 hours. BNP (last 3 results) No results for input(s): PROBNP in the last 8760 hours. HbA1C: Recent Labs    01/07/20 1855  HGBA1C 7.5*   CBG: Recent Labs  Lab 01/09/20 1943 01/10/20 0029 01/10/20 0436 01/10/20 0743 01/10/20 1114  GLUCAP 142* 176* 223* 216* 171*   Lipid Profile: Recent Labs    01/08/20 0228  CHOL 76  HDL 18*  LDLCALC 28  TRIG 151*  CHOLHDL 4.2   Thyroid Function Tests: No results for input(s): TSH, T4TOTAL, FREET4, T3FREE, THYROIDAB in the last 72 hours. Anemia Panel: No results for input(s): VITAMINB12, FOLATE, FERRITIN, TIBC, IRON, RETICCTPCT in the last 72 hours. Sepsis Labs: Recent Labs  Lab 01/07/20 1603 01/07/20 1855  LATICACIDVEN 1.1 0.9    Recent Results (from the past 240 hour(s))  Respiratory Panel by RT PCR (Flu A&B, Covid) - Nasopharyngeal Swab     Status: None   Collection Time: 12/31/19  5:23 PM   Specimen: Nasopharyngeal Swab  Result Value Ref Range Status   SARS Coronavirus 2 by RT PCR NEGATIVE NEGATIVE Final    Comment: (NOTE) SARS-CoV-2 target nucleic acids are NOT DETECTED. The SARS-CoV-2 RNA is generally detectable in upper respiratoy specimens during the acute phase of infection. The lowest concentration of  SARS-CoV-2 viral copies this assay can detect is 131 copies/mL. A negative result does not preclude SARS-Cov-2 infection and should not be used as the sole basis for treatment or other patient management decisions. A negative result may occur with  improper specimen collection/handling, submission of specimen other than nasopharyngeal swab, presence of viral mutation(s) within the areas targeted by this assay, and inadequate number of viral copies (<131 copies/mL). A negative result must be combined with clinical observations, patient history, and epidemiological information. The expected result is Negative. Fact Sheet for Patients:  PinkCheek.be Fact Sheet for Healthcare Providers:  GravelBags.it This test is not yet ap proved or cleared by the [place] FDA and  has been authorized for detection and/or diagnosis of SARS-CoV-2 by FDA under an Emergency Use Authorization (EUA). This EUA will remain  in effect (meaning this test can be used) for the duration of the COVID-19 declaration under Section 564(b)(1) of the Act, 21 U.S.C. section 360bbb-3(b)(1), unless the authorization is terminated or revoked sooner.    Influenza A by PCR NEGATIVE NEGATIVE Final   Influenza B by PCR NEGATIVE NEGATIVE Final    Comment: (NOTE) The Xpert Xpress SARS-CoV-2/FLU/RSV assay is intended as an aid in  the diagnosis of influenza from Nasopharyngeal swab specimens and  should not be used as a sole basis for treatment. Nasal washings and  aspirates are unacceptable for Xpert Xpress SARS-CoV-2/FLU/RSV  testing. Fact Sheet for Patients: PinkCheek.be Fact Sheet for Healthcare Providers: GravelBags.it This test is not yet approved or cleared by the [place] FDA and  has been authorized for detection and/or  diagnosis of SARS-CoV-2 by  FDA under an Emergency Use Authorization (EUA).  This EUA will remain  in effect (meaning this test can be used) for the duration of the  Covid-19 declaration under Section 564(b)(1) of the Act, 21  U.S.C. section 360bbb-3(b)(1), unless the authorization is  terminated or revoked. Performed at [facility], [place] [address], [place], [place] [address]   Surgical pcr screen     Status: Abnormal   Collection Time: 01/01/20  4:27 AM   Specimen: Nasal Mucosa; Nasal Swab  Result Value Ref Range Status   MRSA, PCR NEGATIVE NEGATIVE Final   Staphylococcus aureus POSITIVE (A) NEGATIVE Final    Comment: (NOTE) The Xpert SA Assay (FDA approved for NASAL specimens in patients 83 years of age and older), is one component of a comprehensive surveillance program. It is not intended to diagnose infection nor to guide or monitor treatment. Performed at [facility], [place] [address], [place], [place] [address]   Culture, blood (routine x 2)     Status: None (Preliminary result)   Collection Time: 01/06/20 10:55 AM   Specimen: BLOOD  Result Value Ref Range Status   Specimen Description BLOOD RIGHT ANTECUBITAL  Final   Special Requests   Final    AEROBIC BOTTLE ONLY Blood Culture results may not be optimal due to an inadequate volume of blood received in culture bottles   Culture   Final    NO GROWTH 4 DAYS Performed at [facility], [place] [address], [place], [place] [address]    Report Status PENDING  Incomplete  Culture, blood (routine x 2)     Status: None (Preliminary result)   Collection Time: 01/06/20 12:17 PM   Specimen: BLOOD  Result Value Ref Range Status   Specimen Description BLOOD RIGHT ANTECUBITAL  Final   Special Requests   Final    AEROBIC BOTTLE ONLY Blood Culture results may not be optimal due to an inadequate volume of blood received in culture bottles   Culture   Final    NO GROWTH 4 DAYS Performed at [facility], [place] [address] [place], [place] [address]    Report Status PENDING  Incomplete   Culture, blood (routine x 2)     Status: None (Preliminary result)   Collection Time: 01/07/20 11:45 AM   Specimen: BLOOD  Result Value Ref Range Status   Specimen Description BLOOD HEMODIALYSIS CATHETER VENOUS  Final   Special Requests   Final    BOTTLES DRAWN AEROBIC ONLY Blood Culture adequate volume   Culture   Final    NO GROWTH 3 DAYS Performed at [facility], [place] [address], [place], [place] [address]    Report Status PENDING  Incomplete  Culture, blood (routine x 2)     Status: None (Preliminary result)   Collection Time: 01/07/20  4:11 PM   Specimen: BLOOD  Result Value Ref Range Status   Specimen Description BLOOD HEMODIALYSIS CATHETER ARTERIAL  Final   Special Requests   Final    BOTTLES DRAWN AEROBIC AND ANAEROBIC Blood Culture adequate volume   Culture   Final    NO GROWTH 3 DAYS Performed at [facility], [place] [address], [place], [place] [address]    Report Status PENDING  Incomplete  SARS CORONAVIRUS 2 (TAT 6-24 HRS) Nasopharyngeal Nasopharyngeal Swab     Status: None   Collection Time: 01/07/20  4:49 PM   Specimen: Nasopharyngeal Swab  Result  Value Ref Range Status   SARS Coronavirus 2 NEGATIVE NEGATIVE Final    Comment: (NOTE) SARS-CoV-2 target nucleic acids are NOT DETECTED. The SARS-CoV-2 RNA is generally detectable in upper and lower respiratory specimens during the acute phase of infection. Negative results do not preclude SARS-CoV-2 infection, do not rule out co-infections with other pathogens, and should not be used as the sole basis for treatment or other patient management decisions. Negative results must be combined with clinical observations, patient history, and epidemiological information. The expected result is Negative. Fact Sheet for Patients: SugarRoll.be Fact Sheet for Healthcare Providers: https://www.woods-mathews.com/ This test is not yet approved or cleared by the [place] FDA  and  has been authorized for detection and/or diagnosis of SARS-CoV-2 by FDA under an Emergency Use Authorization (EUA). This EUA will remain  in effect (meaning this test can be used) for the duration of the COVID-19 declaration under Section 56 4(b)(1) of the Act, 21 U.S.C. section 360bbb-3(b)(1), unless the authorization is terminated or revoked sooner. Performed at [facility], [place] [address] [place], [place] [address]          Radiology Studies: US Abdomen Complete  Result Date: 01/10/2020 CLINICAL DATA:  Acute renal injury. Groin lymphadenopathy. Ordering physician concern for abdominal malignancy. EXAM: ABDOMEN ULTRASOUND COMPLETE COMPARISON:  None. FINDINGS: Gallbladder: The gallbladder wall measures 4.8 mm. No stones. Sludge is identified. No Murphy's sign identified. There is pericholecystic fluid. Common bile duct: Diameter: 3.7 mm Liver: No focal lesion identified. Within normal limits in parenchymal echogenicity. Portal vein is patent on color Doppler imaging with normal direction of blood flow towards the liver. IVC: No abnormality visualized. Pancreas: Not visualized. Spleen: Size and appearance within normal limits. Right Kidney: Length: 9.9 cm. Increased echogenicity. No focal mass or hydronephrosis. Left Kidney: Length: 11.7 cm. Increased echogenicity. No focal mass or hydronephrosis. Abdominal aorta: The mid and distal abdominal aorta was not well visualized due to shadowing bowel gas. No aneurysm identified. Other findings: None. IMPRESSION: 1. There is gallbladder wall thickening with sludge and pericholecystic fluid. No stones or Murphy's sign. If there is concern for acute cholecystitis, recommend HIDA scan. 2. The pancreas was not visualized due to shadowing bowel gas. 3. Medical renal disease. 4. No other abnormalities are identified. Electronically Signed   By: Dorise Bullion III M.D   On: 01/10/2020 10:56        Scheduled Meds: . aspirin  300 mg Rectal  Daily  . atorvastatin  80 mg Per NG tube q1800  . calcium acetate  667 mg Oral TID WC  . Chlorhexidine Gluconate Cloth  6 each Topical Q0600  . darbepoetin (ARANESP) injection - DIALYSIS  100 mcg Intravenous Q Mon-HD  . feeding supplement (PRO-STAT SUGAR FREE 64)  30 mL Per Tube BID  . heparin  5,000 Units Subcutaneous Q8H  . insulin aspart  0-9 Units Subcutaneous Q4H  . multivitamin  1 tablet Per NG tube QHS  . sodium chloride flush  3 mL Intravenous Q12H  . ticagrelor  90 mg Per NG tube BID  . vancomycin  500 mg Intravenous Q M,W,F-HD   Continuous Infusions: . sodium chloride 75 mL/hr at 01/10/20 1452  . ceFEPime (MAXIPIME) IV 1 g (01/09/20 2018)  . feeding supplement (NEPRO CARB STEADY) 40 mL/hr at 01/10/20 0300     LOS: 10 days    Time spent: 40 min    Nicolette Bang, MD [facility]  If 7PM-7AM, please contact night-coverage  01/10/2020, 2:57 PM

## 2020-01-10 NOTE — Progress Notes (Signed)
[facility] Progress Note   Assessment/ Plan:   Pt is a 49 y.o. yo male with DM. HTN, history of CVA's and progressive CKD-  Followed by Dr. Durwin Reges mose recently who was admitted on 12/31/2019 with and ischemic foot   Assessment/Plan: 1. Renal-  Progressive CKD, failure to thrive and many episodes of hyperkalemia so decision has been made to go ahead and initiate chronic dialysis for ESRD.  S/p [facility] and first HD treatment 3/12  Second treatment Monday 3/15.  HD #3 3/17, s/p AVF 3/15, appreciate VVS.  Searching for OP HD unit- lives in [place] but seem to be looking at SNF as pt cannot be left alone and son works.  Renal navigator on the case.  MWF schedule now 2.  Encephalopathy- this is worsening- MRI with new strokes, possibly embolic- agree with repeat TTE and TEE, I ordered TTE.  He's on broad-spectrum abx.  Appreciate neuro.  ? seizures   3. Hyperkalemia-  Improved with medical treatment -  stopped lokelma since starting HD 4. Anemia- iron stores low-  Need to repleting with ferrlecit  and also started ESA 5. Secondary hyperparathyroidism- phos high, start binder  6. HTN/volume-  BP high- norvasc 10 and prn hydralazine, will change to scheduled- BP much better 7. Metabolic acidosis-  resolved 8. Ischemic foot- s/p fempop bypass per vasc.   9.  Multiple CVA's-  On secondary prevention.  Neuro following, possible event AM 3/16.  S/p MRI 3/18, possible new embolic strokes 10.  Sepsis: worsening leukocytosis, blood cultures collected peripherally 3/16, culture from HD cath 3/17, all neg so far, CXR looks clear on my read, started vanc/ cefepime 3/17. WBC ct uptrending- no reports of diarrhea, WBC in the low 30s.  Fever curve improved   11.  Dispo-  Needs SNF due to confusion and eyesight.  It appears that prognosis is poor.  Downturn- could consider pall c/s    Subjective:    HD yesterday, still encephalopathic, no changes overall.    Objective:   BP 111/67 (BP Location:  Right Arm)   Pulse 93   Temp 98.3 F (36.8 C) (Oral)   Resp 16   Ht 5\' 5"  (1.651 m)   Wt 52.8 kg   SpO2 100%   BMI 19.37 kg/m   Physical Exam: Gen: sleeping, encephalopathic CVS: RRR Resp: clear bilaterally  Abd: soft Ext: no LE edema, L leg with well-healing incisions and erythema/warmth of the L foot, possible streaking redness upwards.  Ischemic changes of the toes. ACCESS: R IJ TDC, L AVF + T/B  Labs: BMET Recent Labs  Lab 01/04/20 0153 01/04/20 0153 01/05/20 0235 01/05/20 0235 01/06/20 0133 01/07/20 3299 01/08/20 0228 01/09/20 0239 01/09/20 1555 01/09/20 1851 01/10/20 0452  NA 140  --  141  --  140 139 135 138  --   --  138  K 3.6  --  4.2  --  4.3 4.5 4.5 4.3  --   --  3.3*  CL 103  --  106  --  99 97* 97* 99  --   --  96*  CO2 26  --  26  --  22 22 19* 19*  --   --  21*  GLUCOSE 72  --  103*  --  88 139* 216* 163*  --   --  256*  BUN 27*  --  31*  --  19 43* 35* 58*  --   --  39*  CREATININE 4.06*  --  4.72*  --  3.77* 6.27* 4.80* 6.36*  --   --  4.45*  CALCIUM 8.4*  --  8.6*  --  8.1* 8.2* 7.7* 8.2*  --   --  7.8*  PHOS 3.3   < > 3.6   < > 4.1 5.1* 5.7* 5.1* 2.7 3.2 4.3   < > = values in this interval not displayed.   CBC Recent Labs  Lab 01/07/20 0412 01/08/20 0228 01/09/20 0239 01/10/20 0452  WBC 22.8* 33.5* 35.2* 32.6*  HGB 7.7* 7.7* 8.0* 8.6*  HCT 25.7* 24.9* 26.0* 28.0*  MCV 92.4 91.5 91.5 91.2  PLT 529* 524* 600* 630*      Medications:    . aspirin EC  81 mg Oral Daily  . atorvastatin  80 mg Per NG tube q1800  . calcium acetate  667 mg Oral TID WC  . Chlorhexidine Gluconate Cloth  6 each Topical Q0600  . darbepoetin (ARANESP) injection - DIALYSIS  100 mcg Intravenous Q Mon-HD  . feeding supplement (PRO-STAT SUGAR FREE 64)  30 mL Per Tube BID  . heparin  5,000 Units Subcutaneous Q8H  . insulin aspart  0-9 Units Subcutaneous Q4H  . multivitamin  1 tablet Per NG tube QHS  . sodium chloride flush  3 mL Intravenous Q12H  . ticagrelor   90 mg Per NG tube BID  . vancomycin  500 mg Intravenous Q M,W,F-HD     Madelon Lips, MD 01/10/2020, 12:33 PM

## 2020-01-10 NOTE — Progress Notes (Signed)
STROKE TEAM PROGRESS NOTE   INTERVAL HISTORY His family is not at bedside. RN in the hall way. Pt pulled off his cortrak overnight. Currently NPO. Will put on ASA PR. Pt today no fever, but still has significant leukocytosis. Abdominal US negative. Pending TEE next week.   Vitals:   01/10/20 1300 01/10/20 1400 01/10/20 1500 01/10/20 1600  BP: 128/61 (!) 113/52 121/61 138/66  Pulse: 90  88 89  Resp: 19 12 17 18   Temp:    98.6 F (37 C)  TempSrc:    Oral  SpO2: 100%  100% 100%  Weight:      Height:        CBC:  Recent Labs  Lab 01/09/20 0239 01/10/20 0452  WBC 35.2* 32.6*  HGB 8.0* 8.6*  HCT 26.0* 28.0*  MCV 91.5 91.2  PLT 600* 630*    Basic Metabolic Panel:  Recent Labs  Lab 01/09/20 0239 01/09/20 1555 01/10/20 0452 01/10/20 1610  NA 138  --  138  --   K 4.3  --  3.3*  --   CL 99  --  96*  --   CO2 19*  --  21*  --   GLUCOSE 163*  --  256*  --   BUN 58*  --  39*  --   CREATININE 6.36*  --  4.45*  --   CALCIUM 8.2*  --  7.8*  --   MG  --    < > 2.3 2.5*  PHOS 5.1*   < > 4.3 4.1   < > = values in this interval not displayed.   Lipid Panel:     Component Value Date/Time   CHOL 76 01/08/2020 0228   TRIG 151 (H) 01/08/2020 0228   HDL 18 (L) 01/08/2020 0228   CHOLHDL 4.2 01/08/2020 0228   VLDL 30 01/08/2020 0228   LDLCALC 28 01/08/2020 0228   HgbA1c:  Lab Results  Component Value Date   HGBA1C 7.5 (H) 01/07/2020   Urine Drug Screen:     Component Value Date/Time   LABOPIA NONE DETECTED 09/30/2019 1009   COCAINSCRNUR NONE DETECTED 09/30/2019 1009   LABBENZ NONE DETECTED 09/30/2019 1009   AMPHETMU NONE DETECTED 09/30/2019 1009   THCU NONE DETECTED 09/30/2019 1009   LABBARB NONE DETECTED 09/30/2019 1009    Alcohol Level     Component Value Date/Time   ETH <10 09/30/2019 1000    IMAGING past 24 hours US Abdomen Complete  Result Date: 01/10/2020 CLINICAL DATA:  Acute renal injury. Groin lymphadenopathy. Ordering physician concern for abdominal  malignancy. EXAM: ABDOMEN ULTRASOUND COMPLETE COMPARISON:  None. FINDINGS: Gallbladder: The gallbladder wall measures 4.8 mm. No stones. Sludge is identified. No Murphy's sign identified. There is pericholecystic fluid. Common bile duct: Diameter: 3.7 mm Liver: No focal lesion identified. Within normal limits in parenchymal echogenicity. Portal vein is patent on color Doppler imaging with normal direction of blood flow towards the liver. IVC: No abnormality visualized. Pancreas: Not visualized. Spleen: Size and appearance within normal limits. Right Kidney: Length: 9.9 cm. Increased echogenicity. No focal mass or hydronephrosis. Left Kidney: Length: 11.7 cm. Increased echogenicity. No focal mass or hydronephrosis. Abdominal aorta: The mid and distal abdominal aorta was not well visualized due to shadowing bowel gas. No aneurysm identified. Other findings: None. IMPRESSION: 1. There is gallbladder wall thickening with sludge and pericholecystic fluid. No stones or Murphy's sign. If there is concern for acute cholecystitis, recommend HIDA scan. 2. The pancreas was not visualized due to  shadowing bowel gas. 3. Medical renal disease. 4. No other abnormalities are identified. Electronically Signed   By: Dorise Bullion III M.D   On: 01/10/2020 10:56   ECHOCARDIOGRAM COMPLETE BUBBLE STUDY  Result Date: 01/10/2020    ECHOCARDIOGRAM REPORT   Patient Name:   Phillip Camacho Date of Exam: 01/10/2020 Medical Rec #:  [id number]     Height:       65.0 in Accession #:    [id number]    Weight:       116.4 lb Date of Birth:  April 04, 1971     BSA:          1.572 m Patient Age:    49 years      BP:           128/61 mmHg Patient Gender: M             HR:           90 bpm. Exam Location:  Inpatient Procedure: 2D Echo, Cardiac Doppler and Color Doppler Indications:    Stroke 434.91/I63.9  History:        Patient has no prior history of Echocardiogram examinations,                 most recent 11/15/2019. Risk Factors:Hypertension, Diabetes,                  Current Smoker and Dyslipidemia. Acute renal failure. CKD. CVA.  Sonographer:    Clayton Lefort RDCS (AE) Referring Phys: [id number] Melvenia Beam  Sonographer Comments: Suboptimal parasternal window. Image acquisition challenging due to uncooperative patient. Patient involuntairly moving during test, repeatedly pushing sonographer's had away from chest. Test stopped after multiple attempts to acquire images. IMPRESSIONS  1. Limited images.  2. Left ventricular ejection fraction, by estimation, is 55%. The left ventricle has normal function. Left ventricular endocardial border not optimally defined to evaluate regional wall motion. There is mild left ventricular hypertrophy. Left ventricular diastolic function could not be evaluated.  3. Right ventricular systolic function was not well visualized. The right ventricular size is normal.  4. Small to moderate pericardial effusion. The pericardial effusion is posterior and lateral to the left ventricle.  5. The mitral valve is grossly normal. No evidence of mitral valve regurgitation.  6. The aortic valve is tricuspid. Aortic valve regurgitation is trivial. FINDINGS  Left Ventricle: Left ventricular ejection fraction, by estimation, is 55%. The left ventricle has normal function. Left ventricular endocardial border not optimally defined to evaluate regional wall motion. The left ventricular internal cavity size was normal in size. There is mild left ventricular hypertrophy. Left ventricular diastolic function could not be evaluated. Right Ventricle: The right ventricular size is normal. Right vetricular wall thickness was not assessed. Right ventricular systolic function was not well visualized. Left Atrium: Left atrial size was normal in size. Right Atrium: Right atrial size was normal in size. Pericardium: A moderately sized pericardial effusion is present. The pericardial effusion is posterior and lateral to the left ventricle. Mitral Valve: The mitral  valve is grossly normal. No evidence of mitral valve regurgitation. Tricuspid Valve: The tricuspid valve is grossly normal. Tricuspid valve regurgitation not assessed. Aortic Valve: The aortic valve is tricuspid. Aortic valve regurgitation is trivial. Pulmonic Valve: The pulmonic valve was not well visualized. Pulmonic valve regurgitation not assessed. Aorta: The aortic root is normal in size and structure. Venous: The inferior vena cava was not well visualized. IAS/Shunts: The interatrial septum was not assessed.  LEFT  VENTRICLE PLAX 2D LVIDd:         3.60 cm LVIDs:         2.80 cm LV PW:         1.20 cm LV IVS:        1.20 cm LVOT diam:     2.00 cm LVOT Area:     3.14 cm  LEFT ATRIUM         Index LA diam:    2.30 cm 1.46 cm/m   AORTA Ao Root diam: 2.60 cm  SHUNTS Systemic Diam: 2.00 cm Rozann Lesches MD Electronically signed by Rozann Lesches MD Signature Date/Time: 01/10/2020/3:05:55 PM    Final     PHYSICAL EXAM  Temp:  [98 F (36.7 C)-98.9 F (37.2 C)] 98.6 F (37 C) (03/20 1600) Pulse Rate:  [88-100] 89 (03/20 1600) Resp:  [12-20] 18 (03/20 1600) BP: (101-140)/(52-88) 138/66 (03/20 1600) SpO2:  [92 %-100 %] 100 % (03/20 1600) Weight:  [52.1 kg-52.8 kg] 52.8 kg (03/20 0241)  General - thin built, well developed, sleepy and lethargic.  Ophthalmologic - fundi not visualized due to noncooperation.  Cardiovascular - Regular rhythm and rate.  Neuro - Patient is sleepy and lethargic, not follow commands. Nonverbal. Eyes conjugate and midline, right pupil 51mm not reactive to light, left pupil 52mm, could not assess pupillary reflexes due to noncooperation, actively and tightly shut eyelid off. No quite tracking sounds with eye movement. Blind in right eye.  Face appears symmetric. He is moving all his extremities spontaneously and purposefully, he does withdraw to stim x4, however, LLE weaker than the right. No tremor or abnormal movements noted. Sensation, coordination and gait not  tested.  ASSESSMENT/PLAN Phillip Camacho is a 49 y.o. 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   Stroke: recurrent embolic infarcts, unknown source - could be due to septic emboli vs. Hypercoagulable state from occult malignancy vs. bilateral severe intracranial stenosis and ulcerated aortic plaque  Code Stroke CT head new hypodensity subcortical white matter and right frontal operculum, remote infarcts, ASPECTS 9/10.    MRI of the brain (01/07/2020): Bilateral small acute infarcts in multiple vascular territories  MRA head(11/15/2019) - Moderate intracranial atherosclerotic disease including bilateral terminal ICAs and MCAs  MRA neck: negative(11/15/2019)  EEG with diffuse encephalopathy  TTE EF 55%   TEE - pending to rule out endocarditis  Loop recorder interrogation no A. fib  LDL 28  HgbA1c 7.5  Heparin for VTE prophylaxis  Brilinta and ASA (not on plavix due to high P2Y12 level = 232) prior to admission, now on ASA and Brilinta. May consider empiric anticoagulation given multiple recurrent cryptogenic embolic infarcts if work up negative for source.  Therapy recommendations:  pending  Disposition:  Pending  Palliative Care Consult pending  Need to rule out endocarditis  Intermittent fever - 3/16 101.5, 3/17 101.9->102.8  Blood culture so far negative  Plan for TEE next week  On Vanco and cefepime  Need to rule out malignancy  Did not perform CT or MRI with contrast due to ESRD on HD  CXR unremarkable  US abdomen negative for malignancy  LE venous Doppler negative for DVT, however showed Multiple  hypoechoic, oval structures with echogenic centers seen in left groin,  likely lymph nodes. Largest measuring 2.4 x 1.1 x 1.4 cm. Anechoic structure with no blood flow seen in left groin, hematoma vs seroma, measures 3.4 x 1.3 x 2.2 cm. - May consider lymph node biopsy  May consider outpatient PET scan  May consider oncology consultation  Aortic large ulcerated plaque  07/2019 TEE showed moderate, protruding and ulcerated plaque involving the transverse aorta. Maximum plaque thickness is 5 mm. Mobile plaque/thrombus is not identified.  Has been on DAPT treatment  MRA Chest 1/25 - no thoracic aortic aneurysm or dissection or ulcerated plaque.  repeat TEE for aortic plaque evaluation and may consider empiric anticoagulation  if needed  History of stroke  07/2019 right small cerebellar and bilateral MCA/PCA, right PCA, right thalamic punctate infarcts.  MRA age advanced atherosclerosis, right ICA siphon 50% stenosis, right P2 moderate and P3 high-grade stenosis.  MRA negative.  LE venous Doppler no DVT.  EF 65 to 70%.  TEE showed ulcerated plaque in aortic arch.  Loop recorder placed.  LDL 248 and A1c 11.6.  Patient put on DAPT for 3 months as well as Lipitor 80.  09/2019 admitted for right CRAO, MRI also found new small right frontal lobe, right occipital and left temporal lobe infarcts.  MRA again multifocal severe intracranial stenosis.  Carotid Doppler negative.  Loop recorder no A. fib.  LDL 133 and A1c 9.8.  Patient was asked not to drive.  10/2019: Punctate acute to early subacute infarcts in the left thalamus, subacute left frontal and right MCA/PCA infarcts - embolic pattern - likely due to bilaterally severe intracranial stenosis, aortic ulcerated plaque and hypercoagulable stated from uncontrolled DM. Changed to ASA and Brilinta. Loop recorder interrogated and no afib.Hypercoagulable panel completed and negative.   Uncontrolled diabetes (but improved)  HgbA1c 7.5, goal <  7.0  SSI  CBG monitoring  Close PCP follow-up for DM control  Hypertension   BP stable  Long-term BP goal 130-150 given multifocal intracranial stenosis  Hyperlipidemia  Home Lipid lowering medication: Lipitor 80 mg daily   LDL 28, goal < 70  Current lipid lowering medication: Lipitor 80 mg daily  Zetia added last admission  Continue statin and Zetia at discharge  ESRD on HD  Nephrology on board  MWF schedule  Creatinine 4.8-6.36-4.45  Other Active Problems  Anemia of chronic disease, stable  Critical ischemia of left lower extremity  Multifactorial Acute Encephalopathy  Failure to thrive  Hospital day #10  I spent  35 minutes in total face-to-face time with the patient, more than 50% of which was spent in counseling and coordination of care, reviewing test results, images and medication, and discussing the diagnosis of recurrent embolic strokes, ESRD on dialysis, high-grade fever, possible sepsis, treatment plan and potential prognosis. This patient's care requiresreview of multiple databases, neurological assessment, discussion with family, other specialists and medical decision making of high complexity. I have discussed with Dr. Wyonia Hough.  Rosalin Hawking, MD PhD Stroke Neurology 01/10/2020 8:13 PM   To contact Stroke Continuity provider, please refer to [email or web address] After hours, contact General Neurology

## 2020-01-10 NOTE — Progress Notes (Signed)
SLP Cancellation Note  Patient Details Name: Phillip Camacho MRN: [id number] DOB: July 14, 1971   Cancelled treatment:         Pt pulled Cortrak out last night. Attempted to trials of po however pt not responding to stimuli including son who arrived. RN stated he has been lethargic/sleepy most of today. Will continue efforts.   Houston Siren 01/10/2020, 2:56 PM [phone]

## 2020-01-10 NOTE — Consult Note (Addendum)
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   Palliative Care was asked to get involved to help with goals of care conversations.   Phillip Camacho has undergone a trialsome hospitalization inclusive of left knee popliteal bypass. Thereafter he was initiated on HD for his (now) ESRD. He shortly after that suffered additional strokes as seen on MRI. More recently has  been encephalopathic and was initiated on vanc/zosyn int he setting of presumed infection. Phillip Camacho has been doing quite poorly since his stoke in January and continues to decline despite medical efforts.   Clinical Assessment and Goals of Care: I have reviewed medical records including EPIC notes, labs and imaging, received report from bedside RN, assessed the patient. Upon assessment at bedside patient is agitated he is flailing his arms and pulling at his mittens. He then is noted to drift off which causes coughing.    I met with Phillip Camacho (son) and called Phillip Camacho (daughter) to further discuss diagnosis prognosis, GOC, EOL wishes, disposition and options.   I introduced Palliative Medicine as specialized medical care for people living with serious illness. It focuses on providing relief from the symptoms and stress of a serious illness. The goal is to improve quality of life for both the patient and the family.  I asked Phillip Camacho to give me a better impression of Phillip Camacho as a person. Per his  family he was with their mother up until five years ago when they formally separated. Phillip Camacho described that this took a toll on Phillip Camacho and he was likely neglectful of his health thereafter. 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 He considered himself a man of faith and practices within the [place] denomination.   After patients first stroke in October per Phillip Camacho he was doing fairly well. She was able to visit him as the lives in [place] and is a member of the [employer]. She said at that time her was able to walk around well and care for himself. She did express that his speech had changed though this was the only thing that was notable. In January he suffered another stroke and has not been able to recover since then he has required assistance with bADLs.  I asked Phillip Camacho and Phillip Camacho what their understanding of their fathers illness is. They said that they presently understand that he is suffering small strokes. They share that the medical team feels this could be related to his heart. Phillip Camacho goes on to tell  me that her father had not attended to his diabetes very well. I shared with her that through reviewing his records he has a multitude of worrisome conditions. We discussed how he has now suffered not one but multiple insults to his brain. He now requires dialysis. We discussed his cardiovascular disease as it relates to his diabetes.   A detailed discussion was had today regarding advanced directives, Phillip Camacho had not filled these out but his daughter,  Phillip Camacho has been appointed by his family members to make decisions on the patients behalf.    Concepts specific to code status, artifical feeding and hydration, continued IV antibiotics and rehospitalization was had.  The difference between a aggressive medical intervention path  and a palliative comfort care path for this patient at this time was had.  Values and goals of care important to patient and family were attempted to be elicited. Phillip Camacho shares that all of the things that have occurred to her father are sudden and its very hard for her to grasp the magnitude of his condition. I told Phillip Camacho that in situations such as Phillip Camacho's performing cardiopulmonary resuscitation and intubating him could result in true devastation to his body.  We talked about the likelihood that he would not be able to get off of ventilatory support.   I asked Phillip Camacho what she feels her father would want if her were able to make this decision. She said from her perspective that she would want to try everything to "give him a chance." I again asked her what he would want?   In regard to [facility] nutritional state. He is not safe to swallow and has self DC'd his coretrack. Phillip Camacho asked why Phillip Camacho cannot just eat regularly. We talked about how some strokes can directly affect our ability to swallow. Expressed concerns over his ability to protect his airway and prevent aspiration. She seemed to understand these concepts.  During our conversation Phillip Camacho asked if he could leave as he felt these conversations were too overwhelming for him.   Phillip Camacho and I agreed that perhaps enough information had been reviewed today. We agreed to a more formal telephone meeting tomorrow at 1500. Participants will be Phillip Camacho (patients ex-partner), Phillip Camacho (son), and Phillip Camacho (daughter).   Discussed with patient the importance of continued conversation with family and their  medical providers regarding overall plan of care and treatment options, ensuring decisions are within the context of the patients values and GOCs.  Decision Maker: Phillip Camacho (Daughter) [phone]   SUMMARY OF RECOMMENDATIONS   Full Code  Family meeting tomorrow at [place]  PT/OT/Speech are involved  Code Status/Advance Care Planning:  Full code  Palliative Prophylaxis:   Aspiration, Bowel Regimen,  Delirium Protocol, Eye Care, Frequent Pain Assessment, Oral Care, Palliative Wound Care and Turn Reposition  Additional Recommendations (Limitations, Scope, Preferences):  Full Scope Treatment  Psycho-social/Spiritual:   Desire for further Chaplaincy support: Yes  Additional Recommendations: Caregiving  Support/Resources  Prognosis:   Unable to determine  Discharge Planning: To Be Determined     Primary Diagnoses: Present on Admission: . Hyperkalemia . Acute renal failure superimposed on stage 4 chronic kidney disease ([place]) . Hypertension  I have reviewed the medical record, interviewed the patient and family, and examined the patient. The following aspects are pertinent.  Past Medical History:  Diagnosis Date  . CKD stage 4 due to type 2 diabetes mellitus ([place]) 08/11/2019  . Diabetes mellitus without complication ([place])   . Hyperlipidemia   .  Nephrotic syndrome in diabetes mellitus ([place]) 09/17/2019  . PAD (peripheral artery disease) ([place]) 12/31/2019  . Stroke [facility])    Social History   Socioeconomic History  . Marital status: Single    Spouse name: Not on file  . Number of children: 3  . Years of education: Not on file  . Highest education level: Not on file  Occupational History  . 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 Smoking status: Current Every Day Smoker  . Smokeless tobacco: Never Used  Substance and Sexual Activity  . Alcohol use: No  . Drug use: Not on file  . Sexual activity: Not on file  Other Topics Concern  . Not on file  Social History Narrative   Mr BRALYNN DONADO is [place] in [place] with work green card   He is able to understand some [place] translator needed at times   His Daughter Phillip Camacho is primary caregiver but lives in [place] in [place] in November 2020   He has a son living in [place] with Phillip Camacho   He has a son who lives in [place]- assists only prn   November 2020  -he is unemployed- previous job at Company secretary in [place] Strain: High Risk  . Difficulty of Paying Living Expenses: Very hard  Food Insecurity: Food Insecurity Present  . Worried About Charity fundraiser in the Last Year: Often true  . Ran Out of Food in the Last Year: Often true  Transportation Needs: Unmet Transportation Needs  . Lack of Transportation (Medical): Yes  . Lack of Transportation (Non-Medical): Yes  Physical Activity:   . Days of Exercise per Week:   . Minutes of Exercise per Session:   Stress:   . Feeling of Stress :   Social Connections: Unknown  . Frequency of Communication with Friends and Family: More than three times a week  . Frequency of Social Gatherings with Friends and Family: Once a week  . Attends Religious Services: Never  . Active Member of Clubs or Organizations: No  . Attends Archivist Meetings: Never  . Marital Status: Not asked   Family History  Problem Relation Age of Onset  . Other Neg Hx        patient denies any particular family medical history   Scheduled Meds: . aspirin  300 mg Rectal Daily  . atorvastatin  80 mg Per NG tube q1800  . calcium acetate  667 mg Oral TID WC  . Chlorhexidine Gluconate Cloth  6 each Topical Q0600  . darbepoetin (ARANESP) injection - DIALYSIS  100 mcg Intravenous Q Mon-HD  . feeding supplement (PRO-STAT SUGAR FREE 64)  30 mL Per Tube BID  . heparin  5,000 Units Subcutaneous Q8H  . insulin aspart  0-9 Units Subcutaneous Q4H  . multivitamin  1 tablet Per NG tube QHS  . sodium chloride flush  3 mL Intravenous Q12H  . ticagrelor  90 mg Per NG tube BID  . vancomycin  500 mg Intravenous Q M,W,F-HD   Continuous Infusions: . sodium chloride 75 mL/hr at 01/10/20 1452  . ceFEPime (MAXIPIME) IV 1 g (01/09/20 2018)  . feeding supplement (NEPRO CARB STEADY) 40 mL/hr at 01/10/20 0300   PRN Meds:.acetaminophen **OR** acetaminophen, fentaNYL  (SUBLIMAZE) injection, hydrALAZINE, morphine injection, ondansetron **OR** ondansetron (ZOFRAN) IV, oxyCODONE-acetaminophen Medications Prior to Admission:  Prior to Admission medications   Medication Sig Start Date End Date Taking? Authorizing Provider  acetaminophen (TYLENOL) 650 MG CR tablet Take 1,300 mg by mouth every 8 (eight) hours as needed for pain.   Yes [provider]  amLODipine (NORVASC) 10 MG tablet Take 1 tablet (10 mg total) by mouth daily. 11/12/19  Yes Fulp, Cammie, MD  aspirin 81 MG EC tablet Take 1 tablet (81 mg total) by mouth daily. 11/18/19  Yes Oswald Hillock, MD  atorvastatin (LIPITOR) 80 MG tablet Take 1 tablet (80 mg total) by mouth daily at 6 PM. 11/18/19  Yes [name], Marge Duncans, MD  blood glucose meter kit and supplies KIT Dispense based on patient and insurance preference. Use up to four times daily as directed. (FOR ICD-9 250.00, 250.01). 08/14/19  Yes [name], DO  ezetimibe (ZETIA) 10 MG tablet Take 1 tablet (10 mg total) by mouth daily. 11/19/19  Yes [name], Marge Duncans, MD  insulin aspart (NOVOLOG) 100 UNIT/ML FlexPen Inject 4 Units into the skin 3 (three) times daily with meals. 11/18/19  Yes Oswald Hillock, MD  Insulin Glargine (LANTUS) 100 UNIT/ML Solostar Pen Inject 12 Units into the skin daily. 11/18/19  Yes Oswald Hillock, MD  loratadine (CLARITIN) 10 MG tablet Take 1 tablet (10 mg total) by mouth daily. For sneezing/itchy eyes 11/12/19  Yes Fulp, Cammie, MD  ticagrelor (BRILINTA) 90 MG TABS tablet Take 1 tablet (90 mg total) by mouth 2 (two) times daily. 11/18/19  Yes Oswald Hillock, MD  erythromycin ophthalmic ointment Place a 1/2 inch ribbon of ointment into the lower eyelid four times a day Patient not taking: Reported on 12/31/2019 11/12/19   Fulp, Ander Gaster, MD  metoprolol tartrate (LOPRESSOR) 25 MG tablet Take 1 tablet (25 mg total) by mouth 2 (two) times daily. Patient not taking: Reported on 12/31/2019 11/18/19 11/17/20  Oswald Hillock, MD  nicotine (NICODERM CQ -  DOSED IN MG/24 HOURS) 21 mg/24hr patch Place 1 patch (21 mg total) onto the skin daily. Patient not taking: Reported on 11/12/2019 08/15/19   [name], DO   No Known Allergies Review of Systems  Unable to perform ROS: Mental status change   Physical Exam Vitals and nursing note reviewed.  Constitutional:      Appearance: He is ill-appearing.     Comments: Agitated, has mittens on  HENT:     Head: Normocephalic.     Nose: Nose normal.     Mouth/Throat:     Mouth: Mucous membranes are dry.  Eyes:     Pupils: Pupils are equal, round, and reactive to light.  Cardiovascular:     Rate and Rhythm: Normal rate and regular rhythm.  Pulmonary:     Effort: Pulmonary effort is normal.  Abdominal:     General: Abdomen is flat.  Musculoskeletal:     Comments: Moving all extremities  Skin:    General: Skin is dry.     Capillary Refill: Capillary refill takes less than 2 seconds.  Neurological:     Mental Status: He is disoriented.     Comments: Aphasic    Vital Signs: BP 138/66 (BP Location: Right Arm)   Pulse 89   Temp 98.6 F (37 C) (Oral)   Resp 18   Ht 5' 5"  (1.651 m)   Wt 52.8 kg   SpO2 100%   BMI 19.37 kg/m  Pain Scale: 0-10   Pain Score: 0-No pain  SpO2: SpO2: 100 % O2 Device:SpO2:  100 % O2 Flow Rate: .O2 Flow Rate (L/min): 2 L/min  IO: Intake/output summary:   Intake/Output Summary (Last 24 hours) at 01/10/2020 1701 Last data filed at 01/09/2020 1736 Gross per 24 hour  Intake --  Output 437 ml  Net -437 ml   LBM: Last BM Date: 01/09/20 Baseline Weight: Weight: 52.6 kg Most recent weight: Weight: 52.8 kg     Palliative Assessment/Data: 10% Time In: 1630 Time Out: 1740 Time Total: 70 Greater than 50%  of this time was spent counseling and coordinating care related to the above assessment and plan.  Signed by: Rosezella Rumpf, NP   Please contact Palliative Medicine Team phone at [phone] for questions and concerns.  For individual provider: See  Shea Evans

## 2020-01-10 NOTE — Progress Notes (Signed)
Patient pulled out core tract,patient has been confused and with mittens to prevent pulling.Tube feeding stopped,X [place] made aware.Will continue to monitor the patient.

## 2020-01-11 DIAGNOSIS — E875 Hyperkalemia: Principal | ICD-10-CM

## 2020-01-11 DIAGNOSIS — E43 Unspecified severe protein-calorie malnutrition: Secondary | ICD-10-CM | POA: Insufficient documentation

## 2020-01-11 DIAGNOSIS — Z8673 Personal history of transient ischemic attack (TIA), and cerebral infarction without residual deficits: Secondary | ICD-10-CM

## 2020-01-11 DIAGNOSIS — I6340 Cerebral infarction due to embolism of unspecified cerebral artery: Secondary | ICD-10-CM

## 2020-01-11 DIAGNOSIS — Z515 Encounter for palliative care: Secondary | ICD-10-CM

## 2020-01-11 LAB — RENAL FUNCTION PANEL
Albumin: 1.4 g/dL — ABNORMAL LOW (ref 3.5–5.0)
Anion gap: 16 — ABNORMAL HIGH (ref 5–15)
BUN: 52 mg/dL — ABNORMAL HIGH (ref 6–20)
CO2: 23 mmol/L (ref 22–32)
Calcium: 8.2 mg/dL — ABNORMAL LOW (ref 8.9–10.3)
Chloride: 103 mmol/L (ref 98–111)
Creatinine, Ser: 5.77 mg/dL — ABNORMAL HIGH (ref 0.61–1.24)
GFR calc Af Amer: 12 mL/min — ABNORMAL LOW (ref >60)
GFR calc non Af Amer: 11 mL/min — ABNORMAL LOW (ref >60)
Glucose, Bld: 140 mg/dL — ABNORMAL HIGH (ref 70–99)
Phosphorus: 4.1 mg/dL (ref 2.5–4.6)
Potassium: 3.5 mmol/L (ref 3.5–5.1)
Sodium: 142 mmol/L (ref 135–145)

## 2020-01-11 LAB — CBC WITH DIFFERENTIAL/PLATELET
Abs Immature Granulocytes: 2.03 10*3/uL — ABNORMAL HIGH (ref 0.00–0.07)
Basophils Absolute: 0.1 10*3/uL (ref 0.0–0.1)
Basophils Relative: 1 %
Eosinophils Absolute: 0.7 10*3/uL — ABNORMAL HIGH (ref 0.0–0.5)
Eosinophils Relative: 3 %
HCT: 25.3 % — ABNORMAL LOW (ref 39.0–52.0)
Hemoglobin: 7.8 g/dL — ABNORMAL LOW (ref 13.0–17.0)
Immature Granulocytes: 8 %
Lymphocytes Relative: 8 %
Lymphs Abs: 1.9 10*3/uL (ref 0.7–4.0)
MCH: 28.3 pg (ref 26.0–34.0)
MCHC: 30.8 g/dL (ref 30.0–36.0)
MCV: 91.7 fL (ref 80.0–100.0)
Monocytes Absolute: 2.7 10*3/uL — ABNORMAL HIGH (ref 0.1–1.0)
Monocytes Relative: 11 %
Neutro Abs: 17.7 10*3/uL — ABNORMAL HIGH (ref 1.7–7.7)
Neutrophils Relative %: 69 %
Platelets: 633 10*3/uL — ABNORMAL HIGH (ref 150–400)
RBC: 2.76 MIL/uL — ABNORMAL LOW (ref 4.22–5.81)
RDW: 14.8 % (ref 11.5–15.5)
WBC: 25.1 10*3/uL — ABNORMAL HIGH (ref 4.0–10.5)
nRBC: 0.1 % (ref 0.0–0.2)

## 2020-01-11 LAB — GLUCOSE, CAPILLARY
Glucose-Capillary: 140 mg/dL — ABNORMAL HIGH (ref 70–99)
Glucose-Capillary: 139 mg/dL — ABNORMAL HIGH (ref 70–99)
Glucose-Capillary: 133 mg/dL — ABNORMAL HIGH (ref 70–99)
Glucose-Capillary: 123 mg/dL — ABNORMAL HIGH (ref 70–99)
Glucose-Capillary: 105 mg/dL — ABNORMAL HIGH (ref 70–99)

## 2020-01-11 LAB — CULTURE, BLOOD (ROUTINE X 2)
Culture: NO GROWTH
Culture: NO GROWTH

## 2020-01-11 NOTE — Progress Notes (Signed)
[facility] Progress Note   Assessment/ Plan:   Pt is a 49 y.o. yo male with DM. HTN, history of CVA's and progressive CKD-  Followed by Dr. Durwin Reges mose recently who was admitted on 12/31/2019 with an ischemic foot   Assessment/Plan: 1. Renal-  Progressive CKD, failure to thrive and many episodes of hyperkalemia so decision has been made to go ahead and initiate chronic dialysis for ESRD.  S/p [facility] and first HD treatment 3/12  Second treatment Monday 3/15.  HD #3 3/17, s/p AVF 3/15, appreciate VVS. MWF schedule now.  Next planned HD 3/22.  Noted palliative meeting for today 3/21.     2.  Encephalopathy- this is worsening- MRI with new strokes, possibly embolic- agree with repeat TTE and TEE, pending.  Appreciate neuro.      3. Hyperkalemia-  resolved  4. Anemia- iron stores low-  started ESA Aranesp 100 q Monday  5. Secondary hyperparathyroidism- phos high, start binder   6. HTN/volume-  off BP meds fr now, no volume excess  7. Metabolic acidosis-  resolved  8. Ischemic foot- s/p fempop bypass per vasc.    9.  Multiple CVA's-  On secondary prevention.  Neuro following, possible event AM 3/16.  S/p MRI 3/18, possible new embolic strokes  10.  Sepsis: worsening leukocytosis, blood cultures collected peripherally 3/16, culture from HD cath 3/17, all neg so far, CXR looks clear on my read, started vanc/ cefepime 3/17. WBC ct uptrended to 30s and now finally down to 25.1 today.    11.  Dispo-  Needs SNF due to confusion and eyesight.  It appears that prognosis is poor.  Appreciate palliative care.  Right now dialysis doesn't appear to be adding to QOL    Subjective:    No overall change.  He was evaluated by speech- too encephalopathic to have PO intake.  WBC finally trending down at this time.    Objective:   BP 124/85 (BP Location: Right Arm)   Pulse 93   Temp 98.4 F (36.9 C) (Oral)   Resp 18   Ht 5\' 5"  (1.651 m)   Wt 53.4 kg   SpO2 100%   BMI 19.59 kg/m    Physical Exam: Gen: sleeping, encephalopathic CVS: RRR Resp: clear bilaterally  Abd: soft Ext: no LE edema, L leg with well-healing incisions Ischemic changes of the toes. ACCESS: R IJ TDC, L AVF + T/B  Labs: BMET Recent Labs  Lab 01/05/20 0235 01/05/20 0235 01/06/20 0133 01/06/20 0133 01/07/20 6578 01/07/20 4696 01/08/20 0228 01/09/20 0239 01/09/20 1555 01/09/20 1851 01/10/20 0452 01/10/20 1610 01/11/20 0242  NA 141  --  140  --  139  --  135 138  --   --  138  --  142  K 4.2  --  4.3  --  4.5  --  4.5 4.3  --   --  3.3*  --  3.5  CL 106  --  99  --  97*  --  97* 99  --   --  96*  --  103  CO2 26  --  22  --  22  --  19* 19*  --   --  21*  --  23  GLUCOSE 103*  --  88  --  139*  --  216* 163*  --   --  256*  --  140*  BUN 31*  --  19  --  43*  --  35* 58*  --   --  39*  --  52*  CREATININE 4.72*  --  3.77*  --  6.27*  --  4.80* 6.36*  --   --  4.45*  --  5.77*  CALCIUM 8.6*  --  8.1*  --  8.2*  --  7.7* 8.2*  --   --  7.8*  --  8.2*  PHOS 3.6   < > 4.1   < > 5.1*   < > 5.7* 5.1* 2.7 3.2 4.3 4.1 4.1   < > = values in this interval not displayed.   CBC Recent Labs  Lab 01/08/20 0228 01/09/20 0239 01/10/20 0452 01/11/20 0242  WBC 33.5* 35.2* 32.6* 25.1*  NEUTROABS  --   --   --  17.7*  HGB 7.7* 8.0* 8.6* 7.8*  HCT 24.9* 26.0* 28.0* 25.3*  MCV 91.5 91.5 91.2 91.7  PLT 524* 600* 630* 633*      Medications:    . aspirin  300 mg Rectal Daily  . atorvastatin  80 mg Per NG tube q1800  . calcium acetate  667 mg Oral TID WC  . Chlorhexidine Gluconate Cloth  6 each Topical Q0600  . darbepoetin (ARANESP) injection - DIALYSIS  100 mcg Intravenous Q Mon-HD  . feeding supplement (PRO-STAT SUGAR FREE 64)  30 mL Per Tube BID  . heparin  5,000 Units Subcutaneous Q8H  . insulin aspart  0-9 Units Subcutaneous Q4H  . multivitamin  1 tablet Per NG tube QHS  . sodium chloride flush  3 mL Intravenous Q12H  . ticagrelor  90 mg Per NG tube BID  . vancomycin  500 mg  Intravenous Q M,W,F-HD     Madelon Lips, MD 01/11/2020, 12:38 PM

## 2020-01-11 NOTE — Progress Notes (Signed)
[place]  [phone] [phone]   Name: Phillip Camacho Date: 01/11/2020 MRN: [id number]  DOB: 05/14/1971  Patient Care Team: [name], MD as PCP - General (Family Medicine) Thereasa Distance, MD as Referring Physician (Nephrology)    REASON FOR CONSULTATION: Phillip Camacho is a 49 y.o. 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  Patient underwent left knee popliteal bypass.  He subsequently required initiation of hemodialysis for ESRD.  Patient subsequently suffered additional strokes as seen on MRI.  He is been persistently encephalopathic and treated on antibiotics in the setting of presumed infection.  He has pulled his NGT making feeding difficult.  Palliative care was consulted to help address goals with family.  SOCIAL HISTORY:     reports that he has been smoking. He has never used smokeless tobacco. He reports that he does not drink alcohol.   Patient is separated from his wife for the past 5 years.  He lives at home with his son.  He has a daughter in the [employer] who lives out of state.  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  ADVANCE DIRECTIVES:  Does not have  CODE STATUS: Full code  PAST MEDICAL HISTORY: Past Medical History:  Diagnosis Date  . CKD stage 4 due to type 2 diabetes mellitus ([place]) 08/11/2019  . Diabetes mellitus without complication ([place])   . Hyperlipidemia   . Nephrotic syndrome in diabetes mellitus ([place]) 09/17/2019  . PAD (peripheral artery disease) ([place]) 12/31/2019  . Stroke [facility])     PAST SURGICAL HISTORY:  Past Surgical History:  Procedure Laterality Date  . AV FISTULA PLACEMENT Left 01/05/2020   Procedure: LEFT ARTERIOVENOUS (AV) FISTULA CREATION;  Surgeon:  [name], MD;  Location: [place];  Service: Vascular;  Laterality: Left;  . BUBBLE STUDY  08/13/2019   Procedure: BUBBLE STUDY;  Surgeon: [name], MD;  Location: [place] [facility];  Service: Cardiovascular;;  . FEMORAL-POPLITEAL BYPASS GRAFT N/A 01/01/2020   Procedure: LEFT LEG  FEMORAL TO BELOW KNEE POPLITEAL BYPASS;  Surgeon: [name], MD;  Location: [place];  Service: Vascular;  Laterality: N/A;  . INSERTION OF DIALYSIS CATHETER Right 01/02/2020   Procedure: INSERTION OF DIALYSIS CATHETER Right Internal Jugular;  Surgeon: Marty Heck, MD;  Location: [place];  Service: Vascular;  Laterality: Right;  . LOOP RECORDER INSERTION N/A 08/13/2019   Procedure: LOOP RECORDER INSERTION;  Surgeon: [name], MD;  Location: [place] [facility];  Service: Cardiovascular;  Laterality: N/A;  . TEE WITHOUT CARDIOVERSION N/A 08/13/2019   Procedure: TRANSESOPHAGEAL ECHOCARDIOGRAM (TEE);  Surgeon: [name], MD;  Location: [facility] ENDOS[facility];  Service: Cardiovascular;  Laterality: N/A;  PATIENT NEEDS LOOP    HEMATOLOGY/ONCOLOGY HISTORY:  Oncology History   No history exists.    ALLERGIES:  has No Known Allergies.  MEDICATIONS:  Current Facility-Administered Medications  Medication Dose Route Frequency Provider Last Rate Last Admin  . 0.9 %  sodium chloride infusion   Intravenous Continuous Rosalin Hawking, MD 75 mL/hr at 01/11/20 1452 New Bag at 01/11/20 1452  . acetaminophen (TYLENOL) tablet 650 mg  650 mg Oral Q4H PRN Kc, Ramesh, MD       Or  . acetaminophen (TYLENOL) suppository 650 mg  650 mg Rectal Q4H PRN Kc, Ramesh,  MD   650 mg at 01/07/20 1201  . aspirin suppository 300 mg  300 mg Rectal Daily Rosalin Hawking, MD   300 mg at 01/11/20 1013  . atorvastatin (LIPITOR) tablet 80 mg  80 mg Per NG tube q1800 Kc, Maren Beach, MD   80 mg at 01/09/20 1851  . ceFEPIme (MAXIPIME) 1 g in sodium chloride 0.9 % 100 mL IVPB  1 g Intravenous Q24H Kc, Ramesh, MD 200 mL/hr at 01/10/20 2009 1 g at 01/10/20  2009  . Chlorhexidine Gluconate Cloth 2 % PADS 6 each  6 each Topical Q0600 Barbie Banner, PA-C   6 each at 01/11/20 0557  . Darbepoetin Alfa (ARANESP) injection 100 mcg  100 mcg Intravenous Q Mon-HD Barbie Banner, PA-C   100 mcg at 01/05/20 1246  . feeding supplement (NEPRO CARB STEADY) liquid 1,000 mL  1,000 mL Oral Continuous Kc, Ramesh, MD 40 mL/hr at 01/10/20 0300 Rate Change at 01/10/20 0300  . feeding supplement (PRO-STAT SUGAR FREE 64) liquid 30 mL  30 mL Per Tube BID Kc, Ramesh, MD   30 mL at 01/09/20 2249  . fentaNYL (SUBLIMAZE) injection 25-50 mcg  25-50 mcg Intravenous Q3H PRN Barbie Banner, PA-C   25 mcg at 01/01/20 0500  . heparin injection 5,000 Units  5,000 Units Subcutaneous Q8H Barbie Banner, PA-C   5,000 Units at 01/11/20 1458  . hydrALAZINE (APRESOLINE) injection 5 mg  5 mg Intravenous Q6H PRN Setzer, Edman Circle, PA-C      . insulin aspart (novoLOG) injection 0-9 Units  0-9 Units Subcutaneous Q4H Barbie Banner, PA-C   1 Units at 01/11/20 1210  . morphine 2 MG/ML injection 2 mg  2 mg Intravenous Q4H PRN Barbie Banner, PA-C   2 mg at 01/11/20 1454  . multivitamin (RENA-VIT) tablet 1 tablet  1 tablet Per NG tube QHS Antonieta Pert, MD   1 tablet at 01/09/20 2249  . ondansetron (ZOFRAN) tablet 4 mg  4 mg Oral Q6H PRN Setzer, Edman Circle, PA-C       Or  . ondansetron [facility]) injection 4 mg  4 mg Intravenous Q6H PRN Setzer, Edman Circle, PA-C      . oxyCODONE-acetaminophen (PERCOCET/ROXICET) 5-325 MG per tablet 1-2 tablet  1-2 tablet Per NG tube Q4H PRN Kc, Ramesh, MD      . sodium chloride flush (NS) 0.9 % injection 3 mL  3 mL Intravenous Q12H Setzer, Edman Circle, PA-C   3 mL at 01/10/20 1247  . ticagrelor (BRILINTA) tablet 90 mg  90 mg Per NG tube BID Kc, Ramesh, MD   90 mg at 01/09/20 2249  . vancomycin (VANCOCIN) IVPB 500 mg/100 ml premix  500 mg Intravenous Q M,W,F-HD Antonieta Pert, MD   Stopped at 01/09/20 1759    VITAL SIGNS: BP 124/85 (BP Location: Right Arm)   Pulse 94    Temp 98.4 F (36.9 C) (Oral)   Resp 18   Ht 5' 5"  (1.651 m)   Wt 117 lb 11.6 oz (53.4 kg)   SpO2 100%   BMI 19.59 kg/m  Filed Weights   01/09/20 1736 01/10/20 0241 01/11/20 0400  Weight: 114 lb 13.8 oz (52.1 kg) 116 lb 6.5 oz (52.8 kg) 117 lb 11.6 oz (53.4 kg)    Estimated body mass index is 19.59 kg/m as calculated from the following:   Height as of this encounter: 5' 5"  (1.651 m).   Weight as of this encounter: 117 lb 11.6 oz (53.4 kg).  LABS: CBC:    Component Value Date/Time   WBC 25.1 (H) 01/11/2020 0242   HGB 7.8 (L) 01/11/2020 0242   HGB 11.3 (L) 11/12/2019 1616   HCT 25.3 (L) 01/11/2020 0242   HCT 34.1 (L) 11/12/2019 1616   PLT 633 (H) 01/11/2020 0242   PLT 527 (H) 11/12/2019 1616   MCV 91.7 01/11/2020 0242   MCV 84 11/12/2019 1616   NEUTROABS 17.7 (H) 01/11/2020 0242   LYMPHSABS 1.9 01/11/2020 0242   MONOABS 2.7 (H) 01/11/2020 0242   EOSABS 0.7 (H) 01/11/2020 0242   BASOSABS 0.1 01/11/2020 0242   Comprehensive Metabolic Panel:    Component Value Date/Time   NA 142 01/11/2020 0242   NA 139 11/12/2019 1616   K 3.5 01/11/2020 0242   CL 103 01/11/2020 0242   CO2 23 01/11/2020 0242   BUN 52 (H) 01/11/2020 0242   BUN 34 (H) 11/12/2019 1616   CREATININE 5.77 (H) 01/11/2020 0242   GLUCOSE 140 (H) 01/11/2020 0242   CALCIUM 8.2 (L) 01/11/2020 0242   AST 11 (L) 01/01/2020 0251   ALT 13 01/01/2020 0251   ALKPHOS 108 01/01/2020 0251   BILITOT 0.5 01/01/2020 0251   BILITOT <0.2 11/12/2019 1616   PROT 6.4 (L) 01/01/2020 0251   PROT 5.6 (L) 11/12/2019 1616   ALBUMIN 1.4 (L) 01/11/2020 0242   ALBUMIN 3.0 (L) 11/12/2019 1616    RADIOGRAPHIC STUDIES: MR BRAIN WO CONTRAST  Result Date: 01/07/2020 CLINICAL DATA:  Left-sided weakness, code stroke follow-up EXAM: MRI HEAD WITHOUT CONTRAST TECHNIQUE: Multiplanar, multiecho pulse sequences of the brain and surrounding structures were obtained without intravenous contrast. COMPARISON:  11/14/2019 FINDINGS: Brain: There  are numerous small foci of restricted diffusion involving multiple vascular territories bilaterally. Multiple chronic small infarcts are identified including involvement of the central white matter, basal ganglia, thalamus, and cerebellum. There is wallerian degeneration along the right cerebral peduncle. Foci of susceptibility likely reflecting chronic blood products are again identified in association with several of these chronic infarcts. There is no intracranial mass, mass effect, or edema. Ventricles are stable in size. There is no hydrocephalus or extra-axial fluid collection. Vascular: Major vessel flow voids at the skull base are preserved. Skull and upper cervical spine: Normal marrow signal is preserved. Sinuses/Orbits: Paranasal sinuses are aerated. Orbits are unremarkable. Other: Sella is unremarkable. Mastoid air cells are clear. Right posterior scalp lipoma. IMPRESSION: Numerous small acute infarctions involving bilateral anterior and posterior circulations. Multiple chronic infarcts. Electronically Signed   By: Macy Mis M.D.   On: 01/07/2020 15:13   US Abdomen Complete  Result Date: 01/10/2020 CLINICAL DATA:  Acute renal injury. Groin lymphadenopathy. Ordering physician concern for abdominal malignancy. EXAM: ABDOMEN ULTRASOUND COMPLETE COMPARISON:  None. FINDINGS: Gallbladder: The gallbladder wall measures 4.8 mm. No stones. Sludge is identified. No Murphy's sign identified. There is pericholecystic fluid. Common bile duct: Diameter: 3.7 mm Liver: No focal lesion identified. Within normal limits in parenchymal echogenicity. Portal vein is patent on color Doppler imaging with normal direction of blood flow towards the liver. IVC: No abnormality visualized. Pancreas: Not visualized. Spleen: Size and appearance within normal limits. Right Kidney: Length: 9.9 cm. Increased echogenicity. No focal mass or hydronephrosis. Left Kidney: Length: 11.7 cm. Increased echogenicity. No focal mass or  hydronephrosis. Abdominal aorta: The mid and distal abdominal aorta was not well visualized due to shadowing bowel gas. No aneurysm identified. Other findings: None. IMPRESSION: 1. There is gallbladder wall thickening with sludge and pericholecystic fluid. No stones or Murphy's sign.  If there is concern for acute cholecystitis, recommend HIDA scan. 2. The pancreas was not visualized due to shadowing bowel gas. 3. Medical renal disease. 4. No other abnormalities are identified. Electronically Signed   By: Dorise Bullion III M.D   On: 01/10/2020 10:56   DG CHEST PORT 1 VIEW  Result Date: 01/07/2020 CLINICAL DATA:  49 year old male with history of fever. Hemodialysis patient. EXAM: PORTABLE CHEST 1 VIEW COMPARISON:  Chest x-ray 01/02/2020. FINDINGS: Right internal jugular PermCath with tip terminating at the superior cavoatrial junction. Lung volumes are normal. Linear scarring in the left lower lung. No consolidative airspace disease. No pleural effusions. No pneumothorax. No pulmonary nodule or mass noted. Pulmonary vasculature and the cardiomediastinal silhouette are within normal limits. Electronic device projecting over the left side of the heart, presumably an implantable loop recorder. IMPRESSION: 1. Low lung volumes without radiographic evidence of acute cardiopulmonary disease. Electronically Signed   By: Vinnie Langton M.D.   On: 01/07/2020 08:50   DG CHEST PORT 1 VIEW  Result Date: 01/02/2020 CLINICAL DATA:  Renal failure; post-op dialysis catheter placement. Hx of diabetes, stroke, loop recorder insertion(07/2019), TEE w/o cardioversion(07/2019). Smoker. EXAM: PORTABLE CHEST 1 VIEW COMPARISON:  11/14/2019 FINDINGS: Right dialysis catheter tip: Right atrium. Loop recorder noted. Low lung volumes are present, causing crowding of the pulmonary vasculature. Borderline enlargement of the cardiopericardial silhouette. The lungs appear clear. No blunting of the costophrenic angles. IMPRESSION: 1. Right  dialysis catheter tip: Right atrium.  No pneumothorax. 2. Borderline enlargement of the cardiopericardial silhouette. Electronically Signed   By: Van Clines M.D.   On: 01/02/2020 11:54   EEG adult  Result Date: 01/08/2020 Lora Havens, MD     01/08/2020 12:15 PM Patient Name: Phillip Camacho MRN: [id number] Epilepsy Attending: Lora Havens Referring Physician/Provider: Dr. Amie [place] Date: 01/07/2020 Duration: 22.52 minutes Patient history: 49 year old male with renal failure on hemodialysis with worsening encephalopathy.  EEG to evaluate for seizures. Level of alertness: Lethargic AEDs during EEG study: None Technical aspects: This EEG study was done with scalp electrodes positioned according to the 10-20 International system of electrode placement. Electrical activity was acquired at a sampling rate of 500Hz  and reviewed with a high frequency filter of 70Hz  and a low frequency filter of 1Hz . EEG data were recorded continuously and digitally stored. Description: No clear posterior dominant was seen.  EEG showed continuous generalized polymorphic 3 to 6 Hz theta-delta slowing.  Hyperventilation photic summation were not performed. Abnormality - Continuous slow, generalized. IMPRESSION: This study is suggestive of moderate to severe diffuse encephalopathy, nonspecific etiology. No seizures or definite epileptiform discharges were seen throughout the recording. Priyanka Barbra Sarks   DG Fluoro Guide CV Line-No Report  Result Date: 01/02/2020 Fluoroscopy was utilized by the requesting physician.  No radiographic interpretation.   VAS [place] ABI WITH/WO TBI  Result Date: 01/03/2020 LOWER EXTREMITY DOPPLER STUDY Indications: Rest pain. High Risk Factors: Hypertension, Diabetes, prior CVA. Other Factors: CKD,.  Vascular Interventions: S/P Left leg femoral to below knee popliteal bypass. Comparison Study: No prior Performing Technologist: Sharion Dove RVS  Examination Guidelines: A complete evaluation  includes at minimum, Doppler waveform signals and systolic blood pressure reading at the level of bilateral brachial, anterior tibial, and posterior tibial arteries, when vessel segments are accessible. Bilateral testing is considered an integral part of a complete examination. Photoelectric Plethysmograph (PPG) waveforms and toe systolic pressure readings are included as required and additional duplex testing as needed. Limited examinations for reoccurring indications may  be performed as noted.  ABI Findings: +---------+------------------+-----+---------+--------+ Right    Rt Pressure (mmHg)IndexWaveform Comment  +---------+------------------+-----+---------+--------+ Brachial 179                    triphasic         +---------+------------------+-----+---------+--------+ PTA      117               0.65                   +---------+------------------+-----+---------+--------+ DP       109               0.60                   +---------+------------------+-----+---------+--------+ Great Toe31                0.17                   +---------+------------------+-----+---------+--------+ +---------+------------------+-----+---------+-------+ Left     Lt Pressure (mmHg)IndexWaveform Comment +---------+------------------+-----+---------+-------+ Brachial 181                    triphasic        +---------+------------------+-----+---------+-------+ PTA      74                0.41                  +---------+------------------+-----+---------+-------+ DP       81                0.45                  +---------+------------------+-----+---------+-------+ Great Toe22                0.12                  +---------+------------------+-----+---------+-------+ +-------+-----------+-----------+------------+------------+ ABI/TBIToday's ABIToday's TBIPrevious ABIPrevious TBI +-------+-----------+-----------+------------+------------+ Right  0.65       0.17                                 +-------+-----------+-----------+------------+------------+ Left   0.45       0.12                                +-------+-----------+-----------+------------+------------+  Summary: Right: Resting right ankle-brachial index indicates moderate right lower extremity arterial disease. The right toe-brachial index is abnormal. Left: Resting left ankle-brachial index indicates severe left lower extremity arterial disease. The left toe-brachial index is abnormal.  *See table(s) above for measurements and observations.  Electronically signed by Harold Barban MD on 01/03/2020 at 7:05:55 PM.    Final    CUP PACEART REMOTE DEVICE CHECK  Result Date: 12/21/2019 Carelink summary report received. Battery status OK. Normal device function. No new symptom episodes, tachy episodes, brady, or pause episodes. No new AF episodes. Monthly summary reports and ROV/PRN. SChancey  ECHOCARDIOGRAM COMPLETE BUBBLE STUDY  Result Date: 01/10/2020    ECHOCARDIOGRAM REPORT   Patient Name:   Phillip Camacho Date of Exam: 01/10/2020 Medical Rec #:  [id number]     Height:       65.0 in Accession #:    [id number]    Weight:       116.4 lb Date of Birth:  1971/05/03     BSA:  1.572 m Patient Age:    23 years      BP:           128/61 mmHg Patient Gender: M             HR:           90 bpm. Exam Location:  Inpatient Procedure: 2D Echo, Cardiac Doppler and Color Doppler Indications:    Stroke 434.91/I63.9  History:        Patient has no prior history of Echocardiogram examinations,                 most recent 11/15/2019. Risk Factors:Hypertension, Diabetes,                 Current Smoker and Dyslipidemia. Acute renal failure. CKD. CVA.  Sonographer:    Clayton Lefort RDCS (AE) Referring Phys: [id number] Melvenia Beam  Sonographer Comments: Suboptimal parasternal window. Image acquisition challenging due to uncooperative patient. Patient involuntairly moving during test, repeatedly pushing sonographer's had away  from chest. Test stopped after multiple attempts to acquire images. IMPRESSIONS  1. Limited images.  2. Left ventricular ejection fraction, by estimation, is 55%. The left ventricle has normal function. Left ventricular endocardial border not optimally defined to evaluate regional wall motion. There is mild left ventricular hypertrophy. Left ventricular diastolic function could not be evaluated.  3. Right ventricular systolic function was not well visualized. The right ventricular size is normal.  4. Small to moderate pericardial effusion. The pericardial effusion is posterior and lateral to the left ventricle.  5. The mitral valve is grossly normal. No evidence of mitral valve regurgitation.  6. The aortic valve is tricuspid. Aortic valve regurgitation is trivial. FINDINGS  Left Ventricle: Left ventricular ejection fraction, by estimation, is 55%. The left ventricle has normal function. Left ventricular endocardial border not optimally defined to evaluate regional wall motion. The left ventricular internal cavity size was normal in size. There is mild left ventricular hypertrophy. Left ventricular diastolic function could not be evaluated. Right Ventricle: The right ventricular size is normal. Right vetricular wall thickness was not assessed. Right ventricular systolic function was not well visualized. Left Atrium: Left atrial size was normal in size. Right Atrium: Right atrial size was normal in size. Pericardium: A moderately sized pericardial effusion is present. The pericardial effusion is posterior and lateral to the left ventricle. Mitral Valve: The mitral valve is grossly normal. No evidence of mitral valve regurgitation. Tricuspid Valve: The tricuspid valve is grossly normal. Tricuspid valve regurgitation not assessed. Aortic Valve: The aortic valve is tricuspid. Aortic valve regurgitation is trivial. Pulmonic Valve: The pulmonic valve was not well visualized. Pulmonic valve regurgitation not assessed.  Aorta: The aortic root is normal in size and structure. Venous: The inferior vena cava was not well visualized. IAS/Shunts: The interatrial septum was not assessed.  LEFT VENTRICLE PLAX 2D LVIDd:         3.60 cm LVIDs:         2.80 cm LV PW:         1.20 cm LV IVS:        1.20 cm LVOT diam:     2.00 cm LVOT Area:     3.14 cm  LEFT ATRIUM         Index LA diam:    2.30 cm 1.46 cm/m   AORTA Ao Root diam: 2.60 cm  SHUNTS Systemic Diam: 2.00 cm Rozann Lesches MD Electronically signed by Rozann Lesches MD Signature Date/Time: 01/10/2020/3:05:55  PM    Final    CT HEAD CODE STROKE WO CONTRAST  Result Date: 01/06/2020 CLINICAL DATA:  Code stroke. Acute onset of mental status change. Left-sided weakness and left facial droop. EXAM: CT HEAD WITHOUT CONTRAST TECHNIQUE: Contiguous axial images were obtained from the base of the skull through the vertex without intravenous contrast. COMPARISON:  MR head 11/14/2019 FINDINGS: Brain: An area of faint hypoattenuation is present in the subcortical white matter of the right frontal operculum. There is possible involvement of the cortex. No other focal cortical lesions are present. Remote lacunar infarcts of the basal ganglia and thalami are stable bilaterally. White matter changes of the brainstem are stable. No other acute cortical infarct is present. The ventricles are of normal size. No significant extraaxial fluid collection is present. A remote lacunar infarct of the right cerebellum is again noted. Vascular: Atherosclerotic changes are present within the cavernous internal carotid arteries bilaterally. No hyperdense vessel is present. Skull: Calvarium is intact. No focal lytic or blastic lesions are present. No significant extracranial soft tissue lesion is present. Sinuses/Orbits: The paranasal sinuses and mastoid air cells are clear. The globes and orbits are within normal limits. ASPECTS ([place] Stroke Program Early CT Score) - Ganglionic level infarction (caudate,  lentiform nuclei, internal capsule, insula, M1-M3 cortex): 7/7 - Supraganglionic infarction (M4-M6 cortex): 2/3 Total score (0-10 with 10 being normal): 9/10 IMPRESSION: 1. New area of hypoattenuation in the subcortical white matter with probable extension to the cortex in the right frontal operculum. 2. No other acute cortical infarct. 3. Remote lacunar infarcts of the basal ganglia, thalami, and right cerebellum. 4. ASPECTS is 9/10 The above was relayed via text pager to Dr. Leonel Ramsay on 01/06/2020 at 07:29 . Electronically Signed   By: San Morelle M.D.   On: 01/06/2020 07:29   VAS [place] LOWER EXTREMITY VENOUS (DVT)  Result Date: 01/07/2020  Lower Venous DVTStudy Indications: Edema.  Limitations: Open wound and poor ultrasound/tissue interface. Comparison Study: LEV 08-12-19, negative. Performing Technologist: Baldwin Crown ARDMS, RVT  Examination Guidelines: A complete evaluation includes B-mode imaging, spectral Doppler, color Doppler, and power Doppler as needed of all accessible portions of each vessel. Bilateral testing is considered an integral part of a complete examination. Limited examinations for reoccurring indications may be performed as noted. The reflux portion of the exam is performed with the patient in reverse Trendelenburg.  +-----+---------------+---------+-----------+----------+--------------+ RIGHTCompressibilityPhasicitySpontaneityPropertiesThrombus Aging +-----+---------------+---------+-----------+----------+--------------+ CFV  Full           Yes      Yes                                 +-----+---------------+---------+-----------+----------+--------------+   +---------+---------------+---------+-----------+----------+-------------------+ LEFT     CompressibilityPhasicitySpontaneityPropertiesThrombus Aging      +---------+---------------+---------+-----------+----------+-------------------+ CFV      Full           No       Yes                                       +---------+---------------+---------+-----------+----------+-------------------+ SFJ                                                   [place]vest for bypass? +---------+---------------+---------+-----------+----------+-------------------+  FV Prox  Full                                                             +---------+---------------+---------+-----------+----------+-------------------+ FV Mid   Full                                                             +---------+---------------+---------+-----------+----------+-------------------+ FV DistalFull                                                             +---------+---------------+---------+-----------+----------+-------------------+ PFV      Full                                                             +---------+---------------+---------+-----------+----------+-------------------+ POP      Full           No       Yes                                      +---------+---------------+---------+-----------+----------+-------------------+ PTV      Full                                         poorly visualized   +---------+---------------+---------+-----------+----------+-------------------+ PERO     Full                                         poorly visualized   +---------+---------------+---------+-----------+----------+-------------------+ Multiple hypoechoic, oval structures with echogenic centers seen in left groin, likely lymph nodes. Largest measuring 2.4 x 1.1 x 1.4 cm. Anechoic structure with no blood flow seen in left groin, hematoma vs seroma, measures 3.4 x 1.3 x 2.2 cm.   Summary: RIGHT: - No evidence of common femoral vein obstruction.  LEFT: - There is no evidence of deep vein thrombosis in the lower extremity. All veins examined are compressible, however there appears to be continuous flow in CFV and popliteal vein. Portions of this examination were limited- see  technologist comments above.  - No cystic structure found in the popliteal fossa.  - Multiple hypoechoic, oval structures with echogenic centers seen in left groin, likely lymph nodes. Largest measuring 2.4 x 1.1 x 1.4 cm. Anechoic structure with no blood flow seen in left groin, hematoma vs seroma, measures 3.4 x 1.3 x 2.2 cm.  *See table(s) above for measurements and observations. Electronically signed by Curt Jews MD on 01/07/2020 at 3:48:56 PM.  Final    VAS [place] UPPER EXT VEIN MAPPING (PRE-OP AVF)  Result Date: 01/03/2020 UPPER EXTREMITY VEIN MAPPING  Indications: Pre-dialysis access. Limitations: bandages/IVs bilaterally Comparison Study: No prior Performing Technologist: Sharion Dove RVS  Examination Guidelines: A complete evaluation includes B-mode imaging, spectral Doppler, color Doppler, and power Doppler as needed of all accessible portions of each vessel. Bilateral testing is considered an integral part of a complete examination. Limited examinations for reoccurring indications may be performed as noted. +-----------------+-------------+----------+--------------+ Right Cephalic   Diameter (cm)Depth (cm)   Findings    +-----------------+-------------+----------+--------------+ Prox upper arm                          not visualized +-----------------+-------------+----------+--------------+ Mid upper arm                           not visualized +-----------------+-------------+----------+--------------+ Dist upper arm                          not visualized +-----------------+-------------+----------+--------------+ Antecubital fossa    0.27        0.27                  +-----------------+-------------+----------+--------------+ Prox forearm         0.25        0.29                  +-----------------+-------------+----------+--------------+ Mid forearm                             not visualized +-----------------+-------------+----------+--------------+ Wrist                                    not visualized +-----------------+-------------+----------+--------------+ +-----------------+-------------+----------+--------------+ Right Basilic    Diameter (cm)Depth (cm)   Findings    +-----------------+-------------+----------+--------------+ Prox upper arm       0.55        0.52                  +-----------------+-------------+----------+--------------+ Mid upper arm        0.48        0.50                  +-----------------+-------------+----------+--------------+ Dist upper arm       0.51        0.37                  +-----------------+-------------+----------+--------------+ Antecubital fossa    0.55        0.37                  +-----------------+-------------+----------+--------------+ Prox forearm         0.34        0.32     branching    +-----------------+-------------+----------+--------------+ Mid forearm          0.29        0.19                  +-----------------+-------------+----------+--------------+ Wrist                                   not visualized +-----------------+-------------+----------+--------------+ +-----------------+-------------+----------+--------------+ Left Cephalic    Diameter (  cm)Depth (cm)   Findings    +-----------------+-------------+----------+--------------+ Prox upper arm       0.17        0.20                  +-----------------+-------------+----------+--------------+ Mid upper arm        0.19        0.26                  +-----------------+-------------+----------+--------------+ Dist upper arm       0.25        0.24                  +-----------------+-------------+----------+--------------+ Antecubital fossa    0.27        0.27                  +-----------------+-------------+----------+--------------+ Prox forearm         0.25        0.29     branching    +-----------------+-------------+----------+--------------+ Mid forearm          0.20        0.25                   +-----------------+-------------+----------+--------------+ Wrist                                   not visualized +-----------------+-------------+----------+--------------+ +-----------------+-------------+----------+---------+ Left Basilic     Diameter (cm)Depth (cm)Findings  +-----------------+-------------+----------+---------+ Prox upper arm       0.34        0.87   branching +-----------------+-------------+----------+---------+ Mid upper arm        0.28        0.62             +-----------------+-------------+----------+---------+ Dist upper arm       0.38        0.43             +-----------------+-------------+----------+---------+ Antecubital fossa    0.40        0.48             +-----------------+-------------+----------+---------+ Prox forearm         0.18        0.36   branching +-----------------+-------------+----------+---------+ Mid forearm          0.11        0.17             +-----------------+-------------+----------+---------+ Wrist                0.11        0.19   branching +-----------------+-------------+----------+---------+ *See table(s) above for measurements and observations.  Diagnosing physician: Harold Barban MD Electronically signed by Harold Barban MD on 01/03/2020 at 20:06:22 PM.    Final     PERFORMANCE STATUS (ECOG) : 4 - Bedbound  Review of Systems Unable to complete  Physical Exam General: Ill-appearing Pulmonary: Unlabored Extremities: no edema, no joint deformities Skin: no rashes Neurological: Opens eyes to noxious stimuli but does not follow commands  IMPRESSION: I met with patient's son and wife.  Patient's daughter participated in the conversation via phone.  Together, we reviewed patient's hospitalization to date and his multiple medical problems.  Patient pulled his NGT two nights ago and has had to utilize mitt restraints. He continues to require hemodialysis.  Patient is unable to  converse or interact meaningfully with family.  We discussed the probability that this is patient's new cognitive/functional baseline and what continued aggressive care would likely entail.  Patient's wife verbalized feeling that patient has no quality of life and she asked about hospice care, which we discussed in detail.  However, his children say that they would like to continue the current scope of treatment for now and are not prepared to make any decisions.  We did discuss CODE STATUS in detail including the probable futility and trauma associated with such interventions.  Son says that patient told him several weeks ago that he was tired and "ready to meet his maker."  Son verbalized feeling that patient would probably not want continued aggressive measures unless there was significant chance at meaningful improvement.  Family asked for more time to consider decisions.   PLAN: -Continue current scope/code -Will follow   Time Total: 60 minutes  Visit consisted of counseling and education dealing with the complex and emotionally intense issues of symptom management and palliative care in the setting of serious and potentially life-threatening illness.Greater than 50%  of this time was spent counseling and coordinating care related to the above assessment and plan.  Signed by: Altha Harm, PhD, NP-C

## 2020-01-11 NOTE — TOC Progression Note (Signed)
Transition of Care [facility]) - Progression Note    Patient Details  Name: Phillip Camacho MRN: [id number] Date of Birth: 08-Dec-1970  Transition of Care [facility]) CM/SW [place], [place] Phone Number: [phone] 01/11/2020, 8:59 AM  Clinical Narrative:     Patient currently still has not bed offer.  TOC team will continue to follow for discharge planning needs.  Expected Discharge Plan: [place] Barriers to Discharge: Continued Medical Work up, Inadequate or no insurance  Expected Discharge Plan and Services Expected Discharge Plan: [place] arrangements for the past 2 months: Single Family Home                                       Social Determinants of Health (SDOH) Interventions    Readmission Risk Interventions No flowsheet data found.

## 2020-01-11 NOTE — Progress Notes (Signed)
  Speech Language Pathology Treatment: Dysphagia  Patient Details Name: Phillip Camacho MRN: [id number] DOB: [birth year] Today's Date: 01/11/2020 Time: 9242-6834 SLP Time Calculation (min) (ACUTE ONLY): 17 min  Assessment / Plan / Recommendation Clinical Impression  [employer] [place] interpreter (ID # [phone]) was used throughout the session for translation. Pt's level of alertness was improved compared to that which was demonstrated on 01/10/20. However, his bolus awareness, bolus manipulation, and bolus acceptance continue to be poor and he therefore does not present as a candidate for p.o. intake at this time. Per EMR, a palliative care meeting is scheduled for today. Replacement of the Cortrak may be necessary for medication administration and nutrition pending family's decision regarding goals of care. SLP will continue to follow pt at this time.    HPI HPI: Pt is a 49 y.o. male with medical history significant for insulin-dependent diabetes mellitus, hypertension, chronic kidney disease stage IV, and recurrent CVAs, likely embolic, now presenting to the emergency department with left foot pain. MRI of the brain: Numerous small acute infarctions involving bilateral anterior and posterior circulations. EEG: moderate to severe diffuse encephalopathy of non-specific etiology.      SLP Plan  Continue with current plan of care       Recommendations  Diet recommendations: NPO Medication Administration: Via alternative means                Oral Care Recommendations: Oral care QID Follow up Recommendations: Other (comment)(TBD) SLP Visit Diagnosis: Dysphagia, oropharyngeal phase (R13.12) Plan: Continue with current plan of care       Anjolaoluwa Siguenza I. Hardin Negus, [place], [place] Office number [phone] Pager [place] 01/11/2020, 12:13 PM

## 2020-01-11 NOTE — Progress Notes (Signed)
STROKE TEAM PROGRESS NOTE   INTERVAL HISTORY Son is at bedside. 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 He has chronic right eye blind but this time seems to have left eye blind or legally blind, concerning for left CRAO this time. Otherwise, pt nonverbal, from time to time, he mourns a little but no words out. Moving BUEs symmetrically. Withdraw BLEs to pain but R>L. 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   Vitals:   01/11/20 0334 01/11/20 0400 01/11/20 0749 01/11/20 1118  BP: (!) 163/66 (!) 145/65 (!) 158/66 124/85  Pulse: 89 80 91 93  Resp: 16 18 10 18   Temp: (!) 97.3 F (36.3 C)  98.4 F (36.9 C) 98.4 F (36.9 C)  TempSrc: Axillary  Oral Oral  SpO2: 100% 100% 100% 100%  Weight:  53.4 kg    Height:        CBC:  Recent Labs  Lab 01/10/20 0452 01/11/20 0242  WBC 32.6* 25.1*  NEUTROABS  --  17.7*  HGB 8.6* 7.8*  HCT 28.0* 25.3*  MCV 91.2 91.7  PLT 630* 633*    Basic Metabolic Panel:  Recent Labs  Lab 01/10/20 0452 01/10/20 0452 01/10/20 1610 01/11/20 0242  NA 138  --   --  142  K 3.3*  --   --  3.5  CL 96*  --   --  103  CO2 21*  --   --  23  GLUCOSE 256*  --   --  140*  BUN 39*  --   --  52*  CREATININE 4.45*  --   --  5.77*  CALCIUM 7.8*  --   --  8.2*  MG 2.3  --  2.5*  --   PHOS 4.3   < > 4.1 4.1   < > = values in this interval not displayed.   Lipid Panel:     Component Value Date/Time   CHOL 76 01/08/2020 0228   TRIG 151 (H) 01/08/2020 0228   HDL 18 (L) 01/08/2020 0228   CHOLHDL 4.2 01/08/2020 0228   VLDL 30 01/08/2020 0228   LDLCALC 28 01/08/2020 0228   HgbA1c:  Lab Results  Component Value Date   HGBA1C 7.5 (H) 01/07/2020   Urine Drug Screen:     Component Value Date/Time   LABOPIA NONE DETECTED 09/30/2019 1009   COCAINSCRNUR NONE DETECTED 09/30/2019 1009   LABBENZ NONE DETECTED 09/30/2019 1009   AMPHETMU NONE DETECTED 09/30/2019 1009   THCU NONE DETECTED  09/30/2019 1009   LABBARB NONE DETECTED 09/30/2019 1009    Alcohol Level     Component Value Date/Time   ETH <10 09/30/2019 1000    IMAGING past 24 hours No results found.  PHYSICAL EXAM  Temp:  [97.3 F (36.3 C)-98.6 F (37 C)] 98.4 F (36.9 C) (03/21 1118) Pulse Rate:  [80-97] 93 (03/21 1118) Resp:  [10-19] 18 (03/21 1118) BP: (121-163)/(53-85) 124/85 (03/21 1118) SpO2:  [100 %] 100 % (03/21 1118) Weight:  [53.4 kg] 53.4 kg (03/21 0400)  General - thin built, well developed, lethargic.  Ophthalmologic - fundi not visualized due to noncooperation.  Cardiovascular - Regular rhythm and rate.  Neuro - Patient is lethargic, not follow commands. Nonverbal, intermittent mourning. Eyes conjugate and midline, able to have bilateral gaze but incomplete, right pupil 50mm not reactive to light, left pupil 19mm, not blinking to visual threat bilaterally, seems to have bilateral blindness. No quite  tracking sounds with eye movement. Face appears symmetric. He is moving [place] spontaneously and purposefully and symmetrically against gravity, he does withdraw to pain BLEs, however, LLE weaker than the right. No tremor or abnormal movements noted. Sensation, coordination and gait not tested. 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  ASSESSMENT/PLAN Mr. CLAYTEN ALLCOCK is a 49 y.o. 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   Stroke: recurrent embolic infarcts, unknown source - could be due to septic emboli vs. Hypercoagulable state from occult malignancy vs. bilateral severe intracranial stenosis and ulcerated aortic  plaque  Code Stroke CT head new hypodensity subcortical white matter and right frontal operculum, remote infarcts, ASPECTS 9/10.    MRI of the brain (01/07/2020): Bilateral small acute infarcts in multiple vascular territories  MRA head(11/15/2019) - Moderate intracranial atherosclerotic disease including bilateral terminal ICAs and MCAs  MRA neck: negative(11/15/2019)  EEG with diffuse encephalopathy  TTE EF 55%   No TEE due to comfort care measures  Loop recorder interrogation no A. fib  LDL 28  HgbA1c 7.5  Heparin for VTE prophylaxis  Brilinta and ASA (not on plavix due to high P2Y12 level = 232) prior to admission, now on ASA and Brilinta.   Therapy recommendations:  SNF recommended  Disposition:  Pending  Palliative Care on board - family has decided on comfort care measures once daughter comes from [place]  Need to rule out endocarditis  Intermittent fever - 3/16 101.5, 3/17 101.9->102.8  Blood culture so far negative  No TEE due to comfort care measures  On Vanco and cefepime  Need to rule out malignancy  Did not perform CT or MRI with contrast due to ESRD on HD  CXR unremarkable  US abdomen negative for malignancy  LE venous Doppler negative for DVT, however showed Multiple hypoechoic, oval structures with echogenic centers seen in left groin, likely lymph nodes. Largest measuring 2.4 x 1.1 x 1.4 cm. Anechoic structure with no blood flow seen in left groin, hematoma vs seroma, measures 3.4 x 1.3 x 2.2 cm. - no lymph node biopsy given comfort care measures  Aortic large ulcerated plaque  07/2019 TEE showed moderate, protruding and ulcerated plaque involving the transverse aorta. Maximum plaque thickness is 5 mm. Mobile plaque/thrombus is not identified.  Has been on DAPT treatment  MRA Chest 1/25 - no thoracic aortic aneurysm or dissection or ulcerated plaque.  No TEE given comfort care measures  History of stroke  07/2019 right small cerebellar  and bilateral MCA/PCA, right PCA, right thalamic punctate infarcts.  MRA age advanced atherosclerosis, right ICA siphon 50% stenosis, right P2 moderate and P3 high-grade stenosis.  MRA negative.  LE venous Doppler no DVT.  EF 65 to 70%.  TEE showed ulcerated plaque in aortic arch.  Loop recorder placed.  LDL 248 and A1c 11.6.  Patient put on DAPT for 3 months as well as Lipitor 80.  09/2019 admitted for right CRAO, MRI also found new small right frontal lobe, right occipital and left temporal lobe infarcts.  MRA again multifocal severe intracranial stenosis.  Carotid Doppler negative.  Loop recorder no A. fib.  LDL 133 and A1c 9.8.  Patient was asked not to drive.  10/2019: Punctate acute to early  subacute infarcts in the left thalamus, subacute left frontal and right MCA/PCA infarcts - embolic pattern - likely due to bilaterally severe intracranial stenosis, aortic ulcerated plaque and hypercoagulable stated from uncontrolled DM. Changed to ASA and Brilinta. Loop recorder interrogated and no afib.Hypercoagulable panel completed and negative.   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  Critical ischemia of left lower extremity  Failure to thrive  Intermittent fever  No TEE given comfort care measures  On cefepime  Other Active Problems  Anemia of chronic disease, stable - Hb 7.8  Multifactorial Acute Encephalopathy  Hospital day  #11  I had long discussion with son at bedside, updated pt current condition, treatment plan and poor prognosis, and answered all the questions. He expressed understanding and appreciation.   Neurology will sign off. Please call with questions. Thanks for the consult.   Rosalin Hawking, MD PhD Stroke Neurology 01/11/2020 6:21 PM       To contact Stroke Continuity provider, please refer to [email or web address] After hours, contact General Neurology

## 2020-01-11 NOTE — Progress Notes (Signed)
Patient has multi pressure wounds one on buttock and one on rt heel. M.D. please consider air mattress.

## 2020-01-11 NOTE — Progress Notes (Signed)
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 Last hospitalized in January 2021 for a stroke since then has been confused, not been eating or drinking, needing supervision with ADLs. In the ED found to have potassium 6.6, bicarbonate 17, BUN 50, and creatinine 5.69, up from 4.8 in January 2021. CBC notable for leukocytosis to 14,500, stable normocytic anemia, and increased thrombocytosis with platelets of 748,000. INR is normal. Covid and influenza PCR are negative. Patient received temporizing measures with insulin/dextrose, bicarbonate, and Lokelma, nephrology/vascular surgery consulted, patient was admitted for critical left leg ischemia and hyperkalemia and renal failure. 3/11-left femoral to below-knee popliteal bypass. Patient seen by nephrology and also started on dialysis after placing TDC.Patient had episode of some weakness and had CT scan that showed abnormal hypoattenuation and underwent MRI and during that time he has been more confused intermittently febrile. MRI came back with bilateral multiple strokes. Followed by neurology. 3/17-patient more confused encephalopathy with worsening leukocytosis and episodic fever:Blood cultures return from dialysis catheter and dialysis and placed on vancomycin and Zosyn.   Assessment & Plan:   Principal Problem:   Hyperkalemia Active Problems:   Acute renal failure superimposed on stage 4 chronic kidney disease (HCC)   Insulin-requiring or dependent type II diabetes mellitus ([place])   Hypertension   Pressure injury of skin   Palliative care by specialist   Goals of care, counseling/discussion   Protein-calorie malnutrition,  severe   Progressive CKD stage 4 due to poorly controlled diabetes and hypertension: nephro on board, work-up negative so far with negative HIV, hep C, hep B. Holding home losartan. status post tunnel dialysis catheter placement and initiation of dialysis 3/12, AV fistula placed 3/15. Cont HD per nephro and will need clip and skilled nursing facility placement.   Critical ischemia of left lower extremity, 3-5 left toes tip blackish:S/p Left femoral to below-knee popliteal bypass with reverse great saphenous vein 3/11. Discussed with vasc 2/2 loss of cortrac, Continue aspirin PR, Brilinta and Lipitor- once re=establsied- Left leg erythematous and Lt 3-5 toes are blackish and getting demarcated being followed by vascular-will need some degree of amputation per vascular. No DVT in lower extremities.   Sepsis with fever/acute encephalopathy: Patient had an intermittent temperature spike, COVID-19 - on 3/17. Chest x-ray 3/17 no acute finding, blood culture from 3/16 and 3/17-still no growth so far.Now no more fever episode.Continueempiric vancomycin/cefepime (started3/17).He has RT IJ HD linein place-no evidence of infection.  Leukocytosis:Decreased to 25, Suspect multifactorial in the setting of fever/sepsis and also contributed byleg ischemia- LT 3-5 TOESnowischemic/gangrenous.  Acute encephalopathy likely multifactorial, metabolic and in relation to bilateral multiple strokes seen on MRI.he had lethargy with Pupillary asymmetry ( chronic). First noted 3/16 am after bath - code stroke activated 3/16- seen by neuro-CT head area of hypoattenuation in the subcortical white matter with probable extension to the cortex in the right frontal operculum, remote infarcts, seen by neurologist and MRI ordered-unable to be completed at he was moving but see below for repeat attempt. Still appears lethargic this morning-monitor closely.   Multiple acute embolic strokes seen on MRI 3/17,with  history of multiple prior strokes:Appreciate neurology input. MRI of the brain (01/07/2020): Bilateral small acute infarcts in multiple vascular  territorie.MRA head(11/15/2019)- Moderate intracranial atherosclerotic diseaseincluding bilateral terminal ICAs and MCAs.MRA neck: negative(11/15/2019).EEG with diffuse encephalopathy.Repeat TTE limited 2/2 pt non-compliance-did note mod effusion, discuss with cards for consult and possible TEE- deferred to neruo for further workup/management.[id number] smoker will need cessation counseling. currently on Lipitor 80, Zetia added last admission. Continue risk factor modification . Spoke with neuro  2/2 loss of cortrak and inability to give asa/ brilinta, will use asa per rectum for now, cortrak on Monday Palliative care 01/11/2020 at 3p  History recurrent CVAs:Patient was admitted in January with recurrent CVA, likely embolic, has had increased weakness on the left and cognitive deficits since then. Cont home Brilinta, aspirin and Lipitor pending neuro recs-as above lost cortrak and awaiting neuro recs  Severe Hyperkalemia: resolved Metabolic acidosis from renal dysfunction-resolved Nephrotic syndrome presented to diabetic nephropathy. Monitor.  Insulin-requiring or dependent type II diabetes mellitus, hemoglobin A1c7.3: 3/17.Blood sugar stable,cont ssi and monitor.   Hypertension,controlled. Transient episode of hypotension 3/17 afternoon, holding his home Norvasc in the setting of sepsis. Allow permissive hypertension in the setting of acute stroke  HLD:on statins.  Tobacco WGN:FAOZ need to advise cessation.  Anemia of CKD-hemoglobin is stable, transfuse for less than 7 g.   Social issues:Patient lives with his son who works during the day and has had difficulty caring for the patient who has been frequently confused since the recent strokes and is often unable to perform basic ADLs on his own.  Spoke with son  and daughter 01/10/2020 regarding concern for poor prognosis and loss of ability to anticoagulate with cortrak loss, discussed palliative care-who they will have a phone meeting at 3pm on 01/11/2020 fro [place], For now still wants full care  Nutrition- tube feed and meds via ngt once tube in  Body mass index is 20.54 kg/m.  DVT prophylaxis: SCD/Compression stockings  Code Status: full    Code Status Orders  (From admission, onward)         Start     Ordered   01/01/20 1225  Full code  Continuous     01/01/20 1224        Code Status History    Date Active Date Inactive Code Status Order ID Comments User Context   12/31/2019 2154 01/01/2020 1224 Full Code [id number]  Vianne Bulls, MD Inpatient   11/14/2019 1656 11/18/2019 2047 Full Code [id number]  Damita Lack, MD ED   09/30/2019 1126 10/02/2019 2131 Full Code [id number]  [name] Roan, MD ED   08/11/2019 2040 08/14/2019 1737 Full Code [id number]  Alma Friendly, MD ED   Advance Care Planning Activity     Family Communication: Palliative today at 3 PM Disposition Plan:   Patient is from:Home Anticipated Disposition:PT recommends SNF. Son is at work during daytime unable to look after him and he agrees for a skilled nursing facility For ADL and eye sight isseus. Barriers to discharge or conditions that needs to be met prior to discharge: Patient was admitted with progressive CKD and critical ischemia of left leg status post bypass surgery, and with worsening renal failure now onnewdialysis, in the interim found to be encephalopathic, febrile and withbilateral embolicacutestrokes and work-up/managementin progress. Remains hospitalized. Once he is stable will need SNF/CLIP, likely in 3-4 days.Palliative care consulted given his complex comorbidities Consults called: None Admission status: Inpatient   Consultants:   Vascular surgery, neurology, palliative care  Procedures:  MR BRAIN WO CONTRAST  Result  Date: 01/07/2020 CLINICAL DATA:  Left-sided weakness, code stroke follow-up EXAM:  MRI HEAD WITHOUT CONTRAST TECHNIQUE: Multiplanar, multiecho pulse sequences of the brain and surrounding structures were obtained without intravenous contrast. COMPARISON:  11/14/2019 FINDINGS: Brain: There are numerous small foci of restricted diffusion involving multiple vascular territories bilaterally. Multiple chronic small infarcts are identified including involvement of the central white matter, basal ganglia, thalamus, and cerebellum. There is wallerian degeneration along the right cerebral peduncle. Foci of susceptibility likely reflecting chronic blood products are again identified in association with several of these chronic infarcts. There is no intracranial mass, mass effect, or edema. Ventricles are stable in size. There is no hydrocephalus or extra-axial fluid collection. Vascular: Major vessel flow voids at the skull base are preserved. Skull and upper cervical spine: Normal marrow signal is preserved. Sinuses/Orbits: Paranasal sinuses are aerated. Orbits are unremarkable. Other: Sella is unremarkable. Mastoid air cells are clear. Right posterior scalp lipoma. IMPRESSION: Numerous small acute infarctions involving bilateral anterior and posterior circulations. Multiple chronic infarcts. Electronically Signed   By: Macy Mis M.D.   On: 01/07/2020 15:13   US Abdomen Complete  Result Date: 01/10/2020 CLINICAL DATA:  Acute renal injury. Groin lymphadenopathy. Ordering physician concern for abdominal malignancy. EXAM: ABDOMEN ULTRASOUND COMPLETE COMPARISON:  None. FINDINGS: Gallbladder: The gallbladder wall measures 4.8 mm. No stones. Sludge is identified. No Murphy's sign identified. There is pericholecystic fluid. Common bile duct: Diameter: 3.7 mm Liver: No focal lesion identified. Within normal limits in parenchymal echogenicity. Portal vein is patent on color Doppler imaging with normal direction of blood flow  towards the liver. IVC: No abnormality visualized. Pancreas: Not visualized. Spleen: Size and appearance within normal limits. Right Kidney: Length: 9.9 cm. Increased echogenicity. No focal mass or hydronephrosis. Left Kidney: Length: 11.7 cm. Increased echogenicity. No focal mass or hydronephrosis. Abdominal aorta: The mid and distal abdominal aorta was not well visualized due to shadowing bowel gas. No aneurysm identified. Other findings: None. IMPRESSION: 1. There is gallbladder wall thickening with sludge and pericholecystic fluid. No stones or Murphy's sign. If there is concern for acute cholecystitis, recommend HIDA scan. 2. The pancreas was not visualized due to shadowing bowel gas. 3. Medical renal disease. 4. No other abnormalities are identified. Electronically Signed   By: Dorise Bullion III M.D   On: 01/10/2020 10:56   DG CHEST PORT 1 VIEW  Result Date: 01/07/2020 CLINICAL DATA:  49 year old male with history of fever. Hemodialysis patient. EXAM: PORTABLE CHEST 1 VIEW COMPARISON:  Chest x-ray 01/02/2020. FINDINGS: Right internal jugular PermCath with tip terminating at the superior cavoatrial junction. Lung volumes are normal. Linear scarring in the left lower lung. No consolidative airspace disease. No pleural effusions. No pneumothorax. No pulmonary nodule or mass noted. Pulmonary vasculature and the cardiomediastinal silhouette are within normal limits. Electronic device projecting over the left side of the heart, presumably an implantable loop recorder. IMPRESSION: 1. Low lung volumes without radiographic evidence of acute cardiopulmonary disease. Electronically Signed   By: Vinnie Langton M.D.   On: 01/07/2020 08:50   DG CHEST PORT 1 VIEW  Result Date: 01/02/2020 CLINICAL DATA:  Renal failure; post-op dialysis catheter placement. Hx of diabetes, stroke, loop recorder insertion(07/2019), TEE w/o cardioversion(07/2019). Smoker. EXAM: PORTABLE CHEST 1 VIEW COMPARISON:  11/14/2019 FINDINGS:  Right dialysis catheter tip: Right atrium. Loop recorder noted. Low lung volumes are present, causing crowding of the pulmonary vasculature. Borderline enlargement of the cardiopericardial silhouette. The lungs appear clear. No blunting of the costophrenic angles. IMPRESSION: 1. Right dialysis catheter tip: Right atrium.  No pneumothorax. 2. Borderline enlargement of  the cardiopericardial silhouette. Electronically Signed   By: Van Clines M.D.   On: 01/02/2020 11:54   EEG adult  Result Date: 01/08/2020 Lora Havens, MD     01/08/2020 12:15 PM Patient Name: CAPTAIN BLUCHER MRN: [id number] Epilepsy Attending: Lora Havens Referring Physician/Provider: Dr. Amie [place] Date: 01/07/2020 Duration: 22.52 minutes Patient history: 49 year old male with renal failure on hemodialysis with worsening encephalopathy.  EEG to evaluate for seizures. Level of alertness: Lethargic AEDs during EEG study: None Technical aspects: This EEG study was done with scalp electrodes positioned according to the 10-20 International system of electrode placement. Electrical activity was acquired at a sampling rate of 500Hz  and reviewed with a high frequency filter of 70Hz  and a low frequency filter of 1Hz . EEG data were recorded continuously and digitally stored. Description: No clear posterior dominant was seen.  EEG showed continuous generalized polymorphic 3 to 6 Hz theta-delta slowing.  Hyperventilation photic summation were not performed. Abnormality - Continuous slow, generalized. IMPRESSION: This study is suggestive of moderate to severe diffuse encephalopathy, nonspecific etiology. No seizures or definite epileptiform discharges were seen throughout the recording. Priyanka Barbra Sarks   DG Fluoro Guide CV Line-No Report  Result Date: 01/02/2020 Fluoroscopy was utilized by the requesting physician.  No radiographic interpretation.   VAS [place] ABI WITH/WO TBI  Result Date: 01/03/2020 LOWER EXTREMITY DOPPLER STUDY  Indications: Rest pain. High Risk Factors: Hypertension, Diabetes, prior CVA. Other Factors: CKD,.  Vascular Interventions: S/P Left leg femoral to below knee popliteal bypass. Comparison Study: No prior Performing Technologist: Sharion Dove RVS  Examination Guidelines: A complete evaluation includes at minimum, Doppler waveform signals and systolic blood pressure reading at the level of bilateral brachial, anterior tibial, and posterior tibial arteries, when vessel segments are accessible. Bilateral testing is considered an integral part of a complete examination. Photoelectric Plethysmograph (PPG) waveforms and toe systolic pressure readings are included as required and additional duplex testing as needed. Limited examinations for reoccurring indications may be performed as noted.  ABI Findings: +---------+------------------+-----+---------+--------+ Right    Rt Pressure (mmHg)IndexWaveform Comment  +---------+------------------+-----+---------+--------+ Brachial 179                    triphasic         +---------+------------------+-----+---------+--------+ PTA      117               0.65                   +---------+------------------+-----+---------+--------+ DP       109               0.60                   +---------+------------------+-----+---------+--------+ Great Toe31                0.17                   +---------+------------------+-----+---------+--------+ +---------+------------------+-----+---------+-------+ Left     Lt Pressure (mmHg)IndexWaveform Comment +---------+------------------+-----+---------+-------+ Brachial 181                    triphasic        +---------+------------------+-----+---------+-------+ PTA      74                0.41                  +---------+------------------+-----+---------+-------+ DP       81  0.45                  +---------+------------------+-----+---------+-------+ Great Toe22                 0.12                  +---------+------------------+-----+---------+-------+ +-------+-----------+-----------+------------+------------+ ABI/TBIToday's ABIToday's TBIPrevious ABIPrevious TBI +-------+-----------+-----------+------------+------------+ Right  0.65       0.17                                +-------+-----------+-----------+------------+------------+ Left   0.45       0.12                                +-------+-----------+-----------+------------+------------+  Summary: Right: Resting right ankle-brachial index indicates moderate right lower extremity arterial disease. The right toe-brachial index is abnormal. Left: Resting left ankle-brachial index indicates severe left lower extremity arterial disease. The left toe-brachial index is abnormal.  *See table(s) above for measurements and observations.  Electronically signed by Harold Barban MD on 01/03/2020 at 7:05:55 PM.    Final    CUP PACEART REMOTE DEVICE CHECK  Result Date: 12/21/2019 [facility] summary report received. Battery status OK. Normal device function. No new symptom episodes, tachy episodes, brady, or pause episodes. No new AF episodes. Monthly summary reports and ROV/PRN. SChancey  ECHOCARDIOGRAM COMPLETE BUBBLE STUDY  Result Date: 01/10/2020    ECHOCARDIOGRAM REPORT   Patient Name:   LAURIER JASPERSON Date of Exam: 01/10/2020 Medical Rec #:  [id number]     Height:       65.0 in Accession #:    [id number]    Weight:       116.4 lb Date of Birth:  30-Jan-1971     BSA:          1.572 m Patient Age:    71 years      BP:           128/61 mmHg Patient Gender: M             HR:           90 bpm. Exam Location:  Inpatient Procedure: 2D Echo, Cardiac Doppler and Color Doppler Indications:    Stroke 434.91/I63.9  History:        Patient has no prior history of Echocardiogram examinations,                 most recent 11/15/2019. Risk Factors:Hypertension, Diabetes,                 Current Smoker and Dyslipidemia. Acute renal  failure. CKD. CVA.  Sonographer:    Clayton Lefort RDCS (AE) Referring Phys: [id number] Melvenia Beam  Sonographer Comments: Suboptimal parasternal window. Image acquisition challenging due to uncooperative patient. Patient involuntairly moving during test, repeatedly pushing sonographer's had away from chest. Test stopped after multiple attempts to acquire images. IMPRESSIONS  1. Limited images.  2. Left ventricular ejection fraction, by estimation, is 55%. The left ventricle has normal function. Left ventricular endocardial border not optimally defined to evaluate regional wall motion. There is mild left ventricular hypertrophy. Left ventricular diastolic function could not be evaluated.  3. Right ventricular systolic function was not well visualized. The right ventricular size is normal.  4. Small to moderate pericardial effusion. The pericardial effusion is posterior and lateral to the left  ventricle.  5. The mitral valve is grossly normal. No evidence of mitral valve regurgitation.  6. The aortic valve is tricuspid. Aortic valve regurgitation is trivial. FINDINGS  Left Ventricle: Left ventricular ejection fraction, by estimation, is 55%. The left ventricle has normal function. Left ventricular endocardial border not optimally defined to evaluate regional wall motion. The left ventricular internal cavity size was normal in size. There is mild left ventricular hypertrophy. Left ventricular diastolic function could not be evaluated. Right Ventricle: The right ventricular size is normal. Right vetricular wall thickness was not assessed. Right ventricular systolic function was not well visualized. Left Atrium: Left atrial size was normal in size. Right Atrium: Right atrial size was normal in size. Pericardium: A moderately sized pericardial effusion is present. The pericardial effusion is posterior and lateral to the left ventricle. Mitral Valve: The mitral valve is grossly normal. No evidence of mitral valve  regurgitation. Tricuspid Valve: The tricuspid valve is grossly normal. Tricuspid valve regurgitation not assessed. Aortic Valve: The aortic valve is tricuspid. Aortic valve regurgitation is trivial. Pulmonic Valve: The pulmonic valve was not well visualized. Pulmonic valve regurgitation not assessed. Aorta: The aortic root is normal in size and structure. Venous: The inferior vena cava was not well visualized. IAS/Shunts: The interatrial septum was not assessed.  LEFT VENTRICLE PLAX 2D LVIDd:         3.60 cm LVIDs:         2.80 cm LV PW:         1.20 cm LV IVS:        1.20 cm LVOT diam:     2.00 cm LVOT Area:     3.14 cm  LEFT ATRIUM         Index LA diam:    2.30 cm 1.46 cm/m   AORTA Ao Root diam: 2.60 cm  SHUNTS Systemic Diam: 2.00 cm Rozann Lesches MD Electronically signed by Rozann Lesches MD Signature Date/Time: 01/10/2020/3:05:55 PM    Final    CT HEAD CODE STROKE WO CONTRAST  Result Date: 01/06/2020 CLINICAL DATA:  Code stroke. Acute onset of mental status change. Left-sided weakness and left facial droop. EXAM: CT HEAD WITHOUT CONTRAST TECHNIQUE: Contiguous axial images were obtained from the base of the skull through the vertex without intravenous contrast. COMPARISON:  MR head 11/14/2019 FINDINGS: Brain: An area of faint hypoattenuation is present in the subcortical white matter of the right frontal operculum. There is possible involvement of the cortex. No other focal cortical lesions are present. Remote lacunar infarcts of the basal ganglia and thalami are stable bilaterally. White matter changes of the brainstem are stable. No other acute cortical infarct is present. The ventricles are of normal size. No significant extraaxial fluid collection is present. A remote lacunar infarct of the right cerebellum is again noted. Vascular: Atherosclerotic changes are present within the cavernous internal carotid arteries bilaterally. No hyperdense vessel is present. Skull: Calvarium is intact. No focal  lytic or blastic lesions are present. No significant extracranial soft tissue lesion is present. Sinuses/Orbits: The paranasal sinuses and mastoid air cells are clear. The globes and orbits are within normal limits. ASPECTS ([place] Stroke Program Early CT Score) - Ganglionic level infarction (caudate, lentiform nuclei, internal capsule, insula, M1-M3 cortex): 7/7 - Supraganglionic infarction (M4-M6 cortex): 2/3 Total score (0-10 with 10 being normal): 9/10 IMPRESSION: 1. New area of hypoattenuation in the subcortical white matter with probable extension to the cortex in the right frontal operculum. 2. No other acute cortical infarct. 3.  Remote lacunar infarcts of the basal ganglia, thalami, and right cerebellum. 4. ASPECTS is 9/10 The above was relayed via text pager to Dr. Leonel Ramsay on 01/06/2020 at 07:29 . Electronically Signed   By: San Morelle M.D.   On: 01/06/2020 07:29   VAS [place] LOWER EXTREMITY VENOUS (DVT)  Result Date: 01/07/2020  Lower Venous DVTStudy Indications: Edema.  Limitations: Open wound and poor ultrasound/tissue interface. Comparison Study: LEV 08-12-19, negative. Performing Technologist: Baldwin Crown ARDMS, RVT  Examination Guidelines: A complete evaluation includes B-mode imaging, spectral Doppler, color Doppler, and power Doppler as needed of all accessible portions of each vessel. Bilateral testing is considered an integral part of a complete examination. Limited examinations for reoccurring indications may be performed as noted. The reflux portion of the exam is performed with the patient in reverse Trendelenburg.  +-----+---------------+---------+-----------+----------+--------------+ RIGHTCompressibilityPhasicitySpontaneityPropertiesThrombus Aging +-----+---------------+---------+-----------+----------+--------------+ CFV  Full           Yes      Yes                                 +-----+---------------+---------+-----------+----------+--------------+    +---------+---------------+---------+-----------+----------+-------------------+ LEFT     CompressibilityPhasicitySpontaneityPropertiesThrombus Aging      +---------+---------------+---------+-----------+----------+-------------------+ CFV      Full           No       Yes                                      +---------+---------------+---------+-----------+----------+-------------------+ SFJ                                                   [place] for bypass? +---------+---------------+---------+-----------+----------+-------------------+ FV Prox  Full                                                             +---------+---------------+---------+-----------+----------+-------------------+ FV Mid   Full                                                             +---------+---------------+---------+-----------+----------+-------------------+ FV DistalFull                                                             +---------+---------------+---------+-----------+----------+-------------------+ PFV      Full                                                             +---------+---------------+---------+-----------+----------+-------------------+ POP  Full           No       Yes                                      +---------+---------------+---------+-----------+----------+-------------------+ PTV      Full                                         poorly visualized   +---------+---------------+---------+-----------+----------+-------------------+ PERO     Full                                         poorly visualized   +---------+---------------+---------+-----------+----------+-------------------+ Multiple hypoechoic, oval structures with echogenic centers seen in left groin, likely lymph nodes. Largest measuring 2.4 x 1.1 x 1.4 cm. Anechoic structure with no blood flow seen in left groin, hematoma vs seroma, measures 3.4 x 1.3 x 2.2 cm.    Summary: RIGHT: - No evidence of common femoral vein obstruction.  LEFT: - There is no evidence of deep vein thrombosis in the lower extremity. All veins examined are compressible, however there appears to be continuous flow in CFV and popliteal vein. Portions of this examination were limited- see technologist comments above.  - No cystic structure found in the popliteal fossa.  - Multiple hypoechoic, oval structures with echogenic centers seen in left groin, likely lymph nodes. Largest measuring 2.4 x 1.1 x 1.4 cm. Anechoic structure with no blood flow seen in left groin, hematoma vs seroma, measures 3.4 x 1.3 x 2.2 cm.  *See table(s) above for measurements and observations. Electronically signed by Curt Jews MD on 01/07/2020 at 3:48:56 PM.    Final    VAS [place] UPPER EXT VEIN MAPPING (PRE-OP AVF)  Result Date: 01/03/2020 UPPER EXTREMITY VEIN MAPPING  Indications: Pre-dialysis access. Limitations: bandages/IVs bilaterally Comparison Study: No prior Performing Technologist: Sharion Dove RVS  Examination Guidelines: A complete evaluation includes B-mode imaging, spectral Doppler, color Doppler, and power Doppler as needed of all accessible portions of each vessel. Bilateral testing is considered an integral part of a complete examination. Limited examinations for reoccurring indications may be performed as noted. +-----------------+-------------+----------+--------------+ Right Cephalic   Diameter (cm)Depth (cm)   Findings    +-----------------+-------------+----------+--------------+ Prox upper arm                          not visualized +-----------------+-------------+----------+--------------+ Mid upper arm                           not visualized +-----------------+-------------+----------+--------------+ Dist upper arm                          not visualized +-----------------+-------------+----------+--------------+ Antecubital fossa    0.27        0.27                   +-----------------+-------------+----------+--------------+ Prox forearm         0.25        0.29                  +-----------------+-------------+----------+--------------+ Mid forearm  not visualized +-----------------+-------------+----------+--------------+ Wrist                                   not visualized +-----------------+-------------+----------+--------------+ +-----------------+-------------+----------+--------------+ Right Basilic    Diameter (cm)Depth (cm)   Findings    +-----------------+-------------+----------+--------------+ Prox upper arm       0.55        0.52                  +-----------------+-------------+----------+--------------+ Mid upper arm        0.48        0.50                  +-----------------+-------------+----------+--------------+ Dist upper arm       0.51        0.37                  +-----------------+-------------+----------+--------------+ Antecubital fossa    0.55        0.37                  +-----------------+-------------+----------+--------------+ Prox forearm         0.34        0.32     branching    +-----------------+-------------+----------+--------------+ Mid forearm          0.29        0.19                  +-----------------+-------------+----------+--------------+ Wrist                                   not visualized +-----------------+-------------+----------+--------------+ +-----------------+-------------+----------+--------------+ Left Cephalic    Diameter (cm)Depth (cm)   Findings    +-----------------+-------------+----------+--------------+ Prox upper arm       0.17        0.20                  +-----------------+-------------+----------+--------------+ Mid upper arm        0.19        0.26                  +-----------------+-------------+----------+--------------+ Dist upper arm       0.25        0.24                   +-----------------+-------------+----------+--------------+ Antecubital fossa    0.27        0.27                  +-----------------+-------------+----------+--------------+ Prox forearm         0.25        0.29     branching    +-----------------+-------------+----------+--------------+ Mid forearm          0.20        0.25                  +-----------------+-------------+----------+--------------+ Wrist                                   not visualized +-----------------+-------------+----------+--------------+ +-----------------+-------------+----------+---------+ Left Basilic     Diameter (cm)Depth (cm)Findings  +-----------------+-------------+----------+---------+ Prox upper arm       0.34        0.87   branching +-----------------+-------------+----------+---------+ Mid upper  arm        0.28        0.62             +-----------------+-------------+----------+---------+ Dist upper arm       0.38        0.43             +-----------------+-------------+----------+---------+ Antecubital fossa    0.40        0.48             +-----------------+-------------+----------+---------+ Prox forearm         0.18        0.36   branching +-----------------+-------------+----------+---------+ Mid forearm          0.11        0.17             +-----------------+-------------+----------+---------+ Wrist                0.11        0.19   branching +-----------------+-------------+----------+---------+ *See table(s) above for measurements and observations.  Diagnosing physician: Harold Barban MD Electronically signed by Harold Barban MD on 01/03/2020 at 7:06:22 PM.    Final      Antimicrobials:   Cefepime and vancomycin   Subjective: Patient still confused not able to answer questions,   Objective: Vitals:   01/11/20 0334 01/11/20 0400 01/11/20 0749 01/11/20 1118  BP: (!) 163/66 (!) 145/65 (!) 158/66 124/85  Pulse: 89 80 91 93  Resp: 16 18 10 18     Temp: (!) 97.3 F (36.3 C)  98.4 F (36.9 C) 98.4 F (36.9 C)  TempSrc: Axillary  Oral Oral  SpO2: 100% 100% 100% 100%  Weight:  53.4 kg    Height:        Intake/Output Summary (Last 24 hours) at 01/11/2020 1130 Last data filed at 01/10/2020 2009 Gross per 24 hour  Intake 100 ml  Output --  Net 100 ml   Filed Weights   01/09/20 1736 01/10/20 0241 01/11/20 0400  Weight: 52.1 kg 52.8 kg 53.4 kg    Examination:  General exam: Confused Respiratory system: Clear to auscultation. Respiratory effort normal. Cardiovascular system: S1 & S2 heard, RRR. No JVD, murmurs, rubs, gallops or clicks. No pedal edema. Gastrointestinal system: Abdomen is nondistended, soft. No organomegaly or masses felt. Normal bowel sounds heard. Central nervous system: Confused, moving all 4 extremities randomly/nonpurposefully. Extremities: Left lower extremity black gangrenous clear demarcation Skin: As above Psychiatry: patient obtunded, unable to answer questions.     Data Reviewed: I have personally reviewed following labs and imaging studies  CBC: Recent Labs  Lab 01/07/20 0412 01/08/20 0228 01/09/20 0239 01/10/20 0452 01/11/20 0242  WBC 22.8* 33.5* 35.2* 32.6* 25.1*  NEUTROABS  --   --   --   --  17.7*  HGB 7.7* 7.7* 8.0* 8.6* 7.8*  HCT 25.7* 24.9* 26.0* 28.0* 25.3*  MCV 92.4 91.5 91.5 91.2 91.7  PLT 529* 524* 600* 630* 098*   Basic Metabolic Panel: Recent Labs  Lab 01/07/20 0412 01/07/20 0412 01/08/20 0228 01/08/20 0228 01/09/20 0239 01/09/20 0239 01/09/20 1555 01/09/20 1851 01/10/20 0452 01/10/20 1610 01/11/20 0242  NA 139  --  135  --  138  --   --   --  138  --  142  K 4.5  --  4.5  --  4.3  --   --   --  3.3*  --  3.5  CL 97*  --  97*  --  99  --   --   --  96*  --  103  CO2 22  --  19*  --  19*  --   --   --  21*  --  23  GLUCOSE 139*  --  216*  --  163*  --   --   --  256*  --  140*  BUN 43*  --  35*  --  58*  --   --   --  39*  --  52*  CREATININE 6.27*  --  4.80*   --  6.36*  --   --   --  4.45*  --  5.77*  CALCIUM 8.2*  --  7.7*  --  8.2*  --   --   --  7.8*  --  8.2*  MG  --   --   --   --   --   --  2.2 2.1 2.3 2.5*  --   PHOS 5.1*   < > 5.7*   < > 5.1*   < > 2.7 3.2 4.3 4.1 4.1   < > = values in this interval not displayed.   GFR: Estimated Creatinine Clearance: 11.8 mL/min (A) (by C-G formula based on SCr of 5.77 mg/dL (H)). Liver Function Tests: Recent Labs  Lab 01/07/20 0412 01/08/20 0228 01/09/20 0239 01/10/20 0452 01/11/20 0242  ALBUMIN 1.5* 1.4* 1.4* 1.4* 1.4*   No results for input(s): LIPASE, AMYLASE in the last 168 hours. No results for input(s): AMMONIA in the last 168 hours. Coagulation Profile: Recent Labs  Lab 01/05/20 0235  INR 1.0   Cardiac Enzymes: No results for input(s): CKTOTAL, CKMB, CKMBINDEX, TROPONINI in the last 168 hours. BNP (last 3 results) No results for input(s): PROBNP in the last 8760 hours. HbA1C: No results for input(s): HGBA1C in the last 72 hours. CBG: Recent Labs  Lab 01/10/20 1945 01/10/20 2335 01/11/20 0423 01/11/20 0747 01/11/20 1115  GLUCAP 145* 129* 140* 139* 133*   Lipid Profile: No results for input(s): CHOL, HDL, LDLCALC, TRIG, CHOLHDL, LDLDIRECT in the last 72 hours. Thyroid Function Tests: No results for input(s): TSH, T4TOTAL, FREET4, T3FREE, THYROIDAB in the last 72 hours. Anemia Panel: No results for input(s): VITAMINB12, FOLATE, FERRITIN, TIBC, IRON, RETICCTPCT in the last 72 hours. Sepsis Labs: Recent Labs  Lab 01/07/20 1603 01/07/20 1855  LATICACIDVEN 1.1 0.9    Recent Results (from the past 240 hour(s))  Culture, blood (routine x 2)     Status: None   Collection Time: 01/06/20 10:55 AM   Specimen: BLOOD  Result Value Ref Range Status   Specimen Description BLOOD RIGHT ANTECUBITAL  Final   Special Requests   Final    AEROBIC BOTTLE ONLY Blood Culture results may not be optimal due to an inadequate volume of blood received in culture bottles   Culture   Final     NO GROWTH 5 DAYS Performed at [facility], [place] [address], [place], [place] [address]    Report Status 01/11/2020 FINAL  Final  Culture, blood (routine x 2)     Status: None   Collection Time: 01/06/20 12:17 PM   Specimen: BLOOD  Result Value Ref Range Status   Specimen Description BLOOD RIGHT ANTECUBITAL  Final   Special Requests   Final    AEROBIC BOTTLE ONLY Blood Culture results may not be optimal due to an inadequate volume of blood received in culture bottles   Culture   Final  NO GROWTH 5 DAYS Performed at [facility], [place] [address], [place], [place] [address]    Report Status 01/11/2020 FINAL  Final  Culture, blood (routine x 2)     Status: None (Preliminary result)   Collection Time: 01/07/20 11:45 AM   Specimen: BLOOD  Result Value Ref Range Status   Specimen Description BLOOD HEMODIALYSIS CATHETER VENOUS  Final   Special Requests   Final    BOTTLES DRAWN AEROBIC ONLY Blood Culture adequate volume   Culture   Final    NO GROWTH 4 DAYS Performed at [facility], [place] [address], [place], [place] [address]    Report Status PENDING  Incomplete  Culture, blood (routine x 2)     Status: None (Preliminary result)   Collection Time: 01/07/20  4:11 PM   Specimen: BLOOD  Result Value Ref Range Status   Specimen Description BLOOD HEMODIALYSIS CATHETER ARTERIAL  Final   Special Requests   Final    BOTTLES DRAWN AEROBIC AND ANAEROBIC Blood Culture adequate volume   Culture   Final    NO GROWTH 4 DAYS Performed at [facility], [place] [address] [place], [place] [address]    Report Status PENDING  Incomplete  SARS CORONAVIRUS 2 (TAT 6-24 HRS) Nasopharyngeal Nasopharyngeal Swab     Status: None   Collection Time: 01/07/20  4:49 PM   Specimen: Nasopharyngeal Swab  Result Value Ref Range Status   SARS Coronavirus 2 NEGATIVE NEGATIVE Final    Comment: (NOTE) SARS-CoV-2 target nucleic acids are NOT DETECTED. The SARS-CoV-2 RNA is generally  detectable in upper and lower respiratory specimens during the acute phase of infection. Negative results do not preclude SARS-CoV-2 infection, do not rule out co-infections with other pathogens, and should not be used as the sole basis for treatment or other patient management decisions. Negative results must be combined with clinical observations, patient history, and epidemiological information. The expected result is Negative. Fact Sheet for Patients: SugarRoll.be Fact Sheet for Healthcare Providers: https://www.woods-mathews.com/ This test is not yet approved or cleared by the [place] FDA and  has been authorized for detection and/or diagnosis of SARS-CoV-2 by FDA under an Emergency Use Authorization (EUA). This EUA will remain  in effect (meaning this test can be used) for the duration of the COVID-19 declaration under Section 56 4(b)(1) of the Act, 21 U.S.C. section 360bbb-3(b)(1), unless the authorization is terminated or revoked sooner. Performed at [facility], [place] [address] [place], [place] [address]          Radiology Studies: US Abdomen Complete  Result Date: 01/10/2020 CLINICAL DATA:  Acute renal injury. Groin lymphadenopathy. Ordering physician concern for abdominal malignancy. EXAM: ABDOMEN ULTRASOUND COMPLETE COMPARISON:  None. FINDINGS: Gallbladder: The gallbladder wall measures 4.8 mm. No stones. Sludge is identified. No Murphy's sign identified. There is pericholecystic fluid. Common bile duct: Diameter: 3.7 mm Liver: No focal lesion identified. Within normal limits in parenchymal echogenicity. Portal vein is patent on color Doppler imaging with normal direction of blood flow towards the liver. IVC: No abnormality visualized. Pancreas: Not visualized. Spleen: Size and appearance within normal limits. Right Kidney: Length: 9.9 cm. Increased echogenicity. No focal mass or hydronephrosis. Left Kidney: Length: 11.7  cm. Increased echogenicity. No focal mass or hydronephrosis. Abdominal aorta: The mid and distal abdominal aorta was not well visualized due to shadowing bowel gas. No aneurysm identified. Other findings: None. IMPRESSION: 1. There is gallbladder wall thickening with sludge and pericholecystic fluid.  No stones or Murphy's sign. If there is concern for acute cholecystitis, recommend HIDA scan. 2. The pancreas was not visualized due to shadowing bowel gas. 3. Medical renal disease. 4. No other abnormalities are identified. Electronically Signed   By: Dorise Bullion III M.D   On: 01/10/2020 10:56   ECHOCARDIOGRAM COMPLETE BUBBLE STUDY  Result Date: 01/10/2020    ECHOCARDIOGRAM REPORT   Patient Name:   Phillip Camacho Date of Exam: 01/10/2020 Medical Rec #:  [id number]     Height:       65.0 in Accession #:    [id number]    Weight:       116.4 lb Date of Birth:  [birth year]     BSA:          1.572 m Patient Age:    57 years      BP:           128/61 mmHg Patient Gender: M             HR:           90 bpm. Exam Location:  Inpatient Procedure: 2D Echo, Cardiac Doppler and Color Doppler Indications:    Stroke 434.91/I63.9  History:        Patient has no prior history of Echocardiogram examinations,                 most recent 11/15/2019. Risk Factors:Hypertension, Diabetes,                 Current Smoker and Dyslipidemia. Acute renal failure. CKD. CVA.  Sonographer:    Clayton Lefort RDCS (AE) Referring Phys: [id number] Melvenia Beam  Sonographer Comments: Suboptimal parasternal window. Image acquisition challenging due to uncooperative patient. Patient involuntairly moving during test, repeatedly pushing sonographer's had away from chest. Test stopped after multiple attempts to acquire images. IMPRESSIONS  1. Limited images.  2. Left ventricular ejection fraction, by estimation, is 55%. The left ventricle has normal function. Left ventricular endocardial border not optimally defined to evaluate regional wall motion. There is  mild left ventricular hypertrophy. Left ventricular diastolic function could not be evaluated.  3. Right ventricular systolic function was not well visualized. The right ventricular size is normal.  4. Small to moderate pericardial effusion. The pericardial effusion is posterior and lateral to the left ventricle.  5. The mitral valve is grossly normal. No evidence of mitral valve regurgitation.  6. The aortic valve is tricuspid. Aortic valve regurgitation is trivial. FINDINGS  Left Ventricle: Left ventricular ejection fraction, by estimation, is 55%. The left ventricle has normal function. Left ventricular endocardial border not optimally defined to evaluate regional wall motion. The left ventricular internal cavity size was normal in size. There is mild left ventricular hypertrophy. Left ventricular diastolic function could not be evaluated. Right Ventricle: The right ventricular size is normal. Right vetricular wall thickness was not assessed. Right ventricular systolic function was not well visualized. Left Atrium: Left atrial size was normal in size. Right Atrium: Right atrial size was normal in size. Pericardium: A moderately sized pericardial effusion is present. The pericardial effusion is posterior and lateral to the left ventricle. Mitral Valve: The mitral valve is grossly normal. No evidence of mitral valve regurgitation. Tricuspid Valve: The tricuspid valve is grossly normal. Tricuspid valve regurgitation not assessed. Aortic Valve: The aortic valve is tricuspid. Aortic valve regurgitation is trivial. Pulmonic Valve: The pulmonic valve was not well visualized. Pulmonic valve regurgitation not assessed. Aorta: The aortic root is normal  in size and structure. Venous: The inferior vena cava was not well visualized. IAS/Shunts: The interatrial septum was not assessed.  LEFT VENTRICLE PLAX 2D LVIDd:         3.60 cm LVIDs:         2.80 cm LV PW:         1.20 cm LV IVS:        1.20 cm LVOT diam:     2.00 cm LVOT  Area:     3.14 cm  LEFT ATRIUM         Index LA diam:    2.30 cm 1.46 cm/m   AORTA Ao Root diam: 2.60 cm  SHUNTS Systemic Diam: 2.00 cm Rozann Lesches MD Electronically signed by Rozann Lesches MD Signature Date/Time: 01/10/2020/3:05:55 PM    Final         Scheduled Meds: . aspirin  300 mg Rectal Daily  . atorvastatin  80 mg Per NG tube q1800  . calcium acetate  667 mg Oral TID WC  . Chlorhexidine Gluconate Cloth  6 each Topical Q0600  . darbepoetin (ARANESP) injection - DIALYSIS  100 mcg Intravenous Q Mon-HD  . feeding supplement (PRO-STAT SUGAR FREE 64)  30 mL Per Tube BID  . heparin  5,000 Units Subcutaneous Q8H  . insulin aspart  0-9 Units Subcutaneous Q4H  . multivitamin  1 tablet Per NG tube QHS  . sodium chloride flush  3 mL Intravenous Q12H  . ticagrelor  90 mg Per NG tube BID  . vancomycin  500 mg Intravenous Q M,W,F-HD   Continuous Infusions: . sodium chloride 75 mL/hr at 01/10/20 2331  . ceFEPime (MAXIPIME) IV 1 g (01/10/20 2009)  . feeding supplement (NEPRO CARB STEADY) 40 mL/hr at 01/10/20 0300     LOS: 11 days    Time spent: 35 minutes    Nicolette Bang, MD [facility]  If 7PM-7AM, please contact night-coverage  01/11/2020, 11:30 AM

## 2020-01-11 NOTE — Progress Notes (Signed)
Was called by the nurse to room to meet again with patient's family regarding the decision to transfer patient to hospice.  I met with patient's son and ex-wife.  Patient's daughter participated in the conversation via phone.  Family confirmed that they would like for patient to transfer to [facility] for end-of-life care.  However, family do not want to make the transition to comfort care nor transfer him until the daughter can arrive from [place].  She plans to try to fly in tomorrow.  We discussed end-of-life care in detail.  Family recognize that once hemodialysis is discontinued the patient will likely only have days.  They recognize that residential hospice would only provide treatments geared towards patient's comfort.  We again discussed CODE STATUS.  All family present verbalized a desire to change CODE STATUS to DNR.  They say that they are not interested in resuscitation or having his life prolonged artificially on a ventilator.  This plan was discussed with Dr. Wyonia Hough  Plan: -Continue current scope of treatment (including hemodialysis) until daughter can arrive and then family would like to transition to full comfort care and residential hospice -DNR/DNI  Time Total: 30 minutes  Visit consisted of counseling and education dealing with the complex and emotionally intense issues of symptom management and palliative care in the setting of serious and potentially life-threatening illness.Greater than 50%  of this time was spent counseling and coordinating care related to the above assessment and plan.  Signed by: Altha Harm, PhD, NP-C

## 2020-01-12 DIAGNOSIS — N186 End stage renal disease: Secondary | ICD-10-CM

## 2020-01-12 DIAGNOSIS — Z794 Long term (current) use of insulin: Secondary | ICD-10-CM

## 2020-01-12 DIAGNOSIS — E119 Type 2 diabetes mellitus without complications: Secondary | ICD-10-CM

## 2020-01-12 LAB — CBC WITH DIFFERENTIAL/PLATELET
Abs Immature Granulocytes: 0 10*3/uL (ref 0.00–0.07)
Basophils Absolute: 0.2 10*3/uL — ABNORMAL HIGH (ref 0.0–0.1)
Basophils Relative: 1 %
Eosinophils Absolute: 1.1 10*3/uL — ABNORMAL HIGH (ref 0.0–0.5)
Eosinophils Relative: 5 %
HCT: 27.4 % — ABNORMAL LOW (ref 39.0–52.0)
Hemoglobin: 7.9 g/dL — ABNORMAL LOW (ref 13.0–17.0)
Lymphocytes Relative: 4 %
Lymphs Abs: 0.9 10*3/uL (ref 0.7–4.0)
MCH: 27.2 pg (ref 26.0–34.0)
MCHC: 28.8 g/dL — ABNORMAL LOW (ref 30.0–36.0)
MCV: 94.5 fL (ref 80.0–100.0)
Monocytes Absolute: 1.5 10*3/uL — ABNORMAL HIGH (ref 0.1–1.0)
Monocytes Relative: 7 %
Neutro Abs: 17.8 10*3/uL — ABNORMAL HIGH (ref 1.7–7.7)
Neutrophils Relative %: 83 %
Platelets: 659 10*3/uL — ABNORMAL HIGH (ref 150–400)
RBC: 2.9 MIL/uL — ABNORMAL LOW (ref 4.22–5.81)
RDW: 15.1 % (ref 11.5–15.5)
WBC: 21.4 10*3/uL — ABNORMAL HIGH (ref 4.0–10.5)
nRBC: 0.1 % (ref 0.0–0.2)
nRBC: 1 /100 WBC — ABNORMAL HIGH

## 2020-01-12 LAB — CULTURE, BLOOD (ROUTINE X 2)
Culture: NO GROWTH
Culture: NO GROWTH
Special Requests: ADEQUATE
Special Requests: ADEQUATE

## 2020-01-12 LAB — GLUCOSE, CAPILLARY
Glucose-Capillary: 107 mg/dL — ABNORMAL HIGH (ref 70–99)
Glucose-Capillary: 120 mg/dL — ABNORMAL HIGH (ref 70–99)
Glucose-Capillary: 134 mg/dL — ABNORMAL HIGH (ref 70–99)

## 2020-01-12 LAB — RENAL FUNCTION PANEL
Albumin: 1.4 g/dL — ABNORMAL LOW (ref 3.5–5.0)
Anion gap: 17 — ABNORMAL HIGH (ref 5–15)
BUN: 65 mg/dL — ABNORMAL HIGH (ref 6–20)
CO2: 18 mmol/L — ABNORMAL LOW (ref 22–32)
Calcium: 8.2 mg/dL — ABNORMAL LOW (ref 8.9–10.3)
Chloride: 108 mmol/L (ref 98–111)
Creatinine, Ser: 7.07 mg/dL — ABNORMAL HIGH (ref 0.61–1.24)
GFR calc Af Amer: 10 mL/min — ABNORMAL LOW (ref >60)
GFR calc non Af Amer: 8 mL/min — ABNORMAL LOW (ref >60)
Glucose, Bld: 126 mg/dL — ABNORMAL HIGH (ref 70–99)
Phosphorus: 5.4 mg/dL — ABNORMAL HIGH (ref 2.5–4.6)
Potassium: 3.6 mmol/L (ref 3.5–5.1)
Sodium: 143 mmol/L (ref 135–145)

## 2020-01-12 MED ORDER — OXYCODONE HCL 5 MG/5ML PO SOLN: 5.0000 mg | ORAL | Status: DC | PRN

## 2020-01-12 MED ORDER — HYDROMORPHONE HCL 1 MG/ML IJ SOLN
0.5000 mg | INTRAMUSCULAR | Status: DC | PRN
  Administered 2020-01-14: 0.5 mg via INTRAVENOUS
  Filled 2020-01-12: qty 1

## 2020-01-12 MED ORDER — LORAZEPAM 2 MG/ML IJ SOLN
0.5000 mg | Freq: Four times a day (QID) | INTRAMUSCULAR | Status: DC | PRN
  Administered 2020-01-12 – 2020-01-14 (×2): 0.5 mg via INTRAVENOUS
  Filled 2020-01-12 (×2): qty 1

## 2020-01-12 MED ORDER — GLYCOPYRROLATE 0.2 MG/ML IJ SOLN: 0.2000 mg | INTRAMUSCULAR | Status: DC | PRN

## 2020-01-12 NOTE — Progress Notes (Signed)
PROGRESS NOTE    Phillip Camacho  ZOX:[id number] DOB: Nov 04, 1970 DOA: 12/31/2019 PCP: [name], MD   Brief Narrative:  Patient is a 49 year old male with history of insulin-dependent diabetes mellitus hypertension, CKD stage IV, recurrent CVA with left-sided residual hemiparesis who was brought to the emergency department with complaints of left leg pain for 4 weeks.  He was last hospitalized in January 2021 and for management of stroke.  Since that time, he has been confused, not eating or drinking, needing supervision with ADL s.  In the emergency department, he was found to be hyperkalemic, has severe AKI with creatinine of 5.6, leukocytosis.  He was found to have critical left lower extremity ischemia.  Admitted for the management of limb ischemia, hyperkalemia, renal failure.  Vascular surgery was consulted and he underwent left femoral to below-knee popliteal bypass on 3/11.  Nephrology was also following and started on dialysis after placing temporary dialysis catheter.  Due to persistent encephalopathy, MRI was done which showed bilateral multiple strokes. Given his multiple comorbidities, palliative care was consulted.  Patient is now  DNR.  Now waiting for final discussion with daughter before transitioning his care to comfort care.  Assessment & Plan:   Principal Problem:   Hyperkalemia Active Problems:   Acute renal failure superimposed on stage 4 chronic kidney disease ([place])   Insulin-requiring or dependent type II diabetes mellitus ([place])   Hypertension   Pressure injury of skin   Palliative care by specialist   Goals of care, counseling/discussion   Protein-calorie malnutrition, severe   Palliative care encounter   Progressively  CKD stage 4 now ESRD: Secondary to poorly controlled diabetes, hypertension.  Nephrology following.  Underwent tunneled dialysis catheter placement and initiation of dialysis on 3/12.  AV fistula placed on 3/15.  Critical limb ischemia of left  lower extremity: Presented with left foot pain.  Status post left femoral to below-knee popliteal bypass with reverse saphenous  vein on 3/11.  Currently on aspirin, Brilinta, Lipitor.  Left leg is erythematous and left 3-5 toes are blackish and getting demarcated.  No evidence of DVT.  Vascular surgery following.  Sepsis with fever: Hospital course remarkable for intermittent temperatures spike.  Chest x-ray on 3/17 did not show any pneumonia.  Blood cultures have not shown any growth.  On empiric vancomycin and cefepime which was started on 3/17.  Has severe leukocytosis.  Acute encephalopathy: Most likely multifactorial associated with bilateral multiple strokes as per MRI, sepsis, uremia.  Continues to remain lethargic.  He was initially on tube feed but he pulled it himself, no plan to put another one.  Multiple acute embolic strokes: Seen by MRI on 3/17.  He has history of strokes in the past.  EEG showed diffuse encephalopathy.  LDL of 28.  Hemoglobin A1c of 7.3.  Severe hyperkalemia: Resolved  Insulin-dependent diabetes type 2: Hemoglobin A1c 7.3 as per 3/17.  Continue current insulin regimen  Hypertension: Monitor blood pressure.  Allow permissive hypertension in the setting of acute stroke  Hyperlipidemia: Statin  Smoker: Continues to smoke  Normocytic anemia: Most likely associated with CKD.  Currently  hemoglobin stable.   Multiple chronically/poor prognosis/debility/deconditioning: Palliative  care closely following.  Currently DNR.  Palliative care planning to meet with the daughter.  Family interested on transition to full comfort care/residential hospice  Pressure Injury 01/07/20 Sacrum Stage 2 -  Partial thickness loss of dermis presenting as a shallow open injury with a red, pink wound bed without slough. (Active)  01/07/20  Location: Sacrum  Location Orientation:   Staging: Stage 2 -  Partial thickness loss of dermis presenting as a shallow open injury with a red, pink  wound bed without slough.  Wound Description (Comments):   Present on Admission:      Pressure Injury 01/10/20 Scrotum Posterior Stage 2 -  Partial thickness loss of dermis presenting as a shallow open injury with a red, pink wound bed without slough. open area yellow with some yellow drainage pink arount it sl swollen (Active)  01/10/20 2000  Location: Scrotum  Location Orientation: Posterior  Staging: Stage 2 -  Partial thickness loss of dermis presenting as a shallow open injury with a red, pink wound bed without slough.  Wound Description (Comments): open area yellow with some yellow drainage pink arount it sl swollen  Present on Admission:      Pressure Injury 01/10/20 Heel Right Unstageable - Full thickness tissue loss in which the base of the injury is covered by slough (yellow, tan, gray, green or brown) and/or eschar (tan, brown or black) in the wound bed. small black area redden around it (Active)  01/10/20 2000  Location: Heel  Location Orientation: Right  Staging: Unstageable - Full thickness tissue loss in which the base of the injury is covered by slough (yellow, tan, gray, green or brown) and/or eschar (tan, brown or black) in the wound bed.  Wound Description (Comments): small black area redden around it  Present on Admission:              Nutrition Problem: Severe Malnutrition Etiology: chronic illness(CKD, recurrent CVAs)      DVT prophylaxis:SCD,compression stockings Code Status: Full Family Communication: None  Disposition Plan: Patient is from home.  PT recommended skilled nursing facility.  Currently palliative care discussed with the family about his poor prognosis, recommended comfort care.  Waiting to talk to the daughter.   Consultants: Palliative care, nephrology, vascular surgery  Procedures: As above  Antimicrobials:  Anti-infectives (From admission, onward)   Start     Dose/Rate Route Frequency Ordered Stop   01/08/20 2000  ceFEPIme  (MAXIPIME) 1 g in sodium chloride 0.9 % 100 mL IVPB     1 g 200 mL/hr over 30 Minutes Intravenous Every 24 hours 01/07/20 0858     01/07/20 1200  vancomycin (VANCOCIN) IVPB 500 mg/100 ml premix     500 mg 100 mL/hr over 60 Minutes Intravenous Every M-W-F (Hemodialysis) 01/07/20 0858     01/07/20 1000  vancomycin (VANCOREADY) IVPB 1250 mg/250 mL     1,250 mg 166.7 mL/hr over 90 Minutes Intravenous To Hemodialysis 01/07/20 0856 01/07/20 1058   01/07/20 1000  ceFEPIme (MAXIPIME) 1 g in sodium chloride 0.9 % 100 mL IVPB     1 g 200 mL/hr over 30 Minutes Intravenous  Once 01/07/20 0856 01/07/20 1252   01/06/20 1130  doxycycline (VIBRA-TABS) tablet 100 mg  Status:  Discontinued     100 mg Oral Every 12 hours 01/06/20 1045 01/07/20 0838   01/05/20 0000  ceFAZolin (ANCEF) IVPB 1 g/50 mL premix    Note to Pharmacy: Send with pt to OR   1 g 100 mL/hr over 30 Minutes Intravenous On call 01/04/20 1134 01/05/20 0735   01/02/20 0927  ceFAZolin (ANCEF) 2-4 GM/100ML-% IVPB    Note to Pharmacy: [name]   : cabinet override      01/02/20 0927 01/02/20 0945   01/01/20 1600  ceFAZolin (ANCEF) IVPB 2g/100 mL premix     2  g 200 mL/hr over 30 Minutes Intravenous Every 8 hours 01/01/20 1224 01/02/20 0125   01/01/20 0706  ceFAZolin (ANCEF) 2-4 GM/100ML-% IVPB    Note to Pharmacy: Lelon Perla   : cabinet override      01/01/20 0706 01/01/20 1914   12/31/19 1745  ceFAZolin (ANCEF) IVPB 2g/100 mL premix     2 g 200 mL/hr over 30 Minutes Intravenous On call to O.R. 12/31/19 1730 01/01/20 0559      Subjective: Patient seen and examined at the bedside this morning.  Hemodynamically stable.  Remains very lethargic, bedbound.  Opens his eyes when calling his name but does not follow any commands.  Objective: Vitals:   01/12/20 0000 01/12/20 0426 01/12/20 0935 01/12/20 0944  BP: (!) 123/98 (!) 136/59 130/65   Pulse: 92 94 84 (!) 34  Resp: 16 16 18 14   Temp: 98.9 F (37.2 C) 97.9 F (36.6 C) 97.8  F (36.6 C)   TempSrc: Axillary Axillary Axillary   SpO2: 100% 100% 98% 98%  Weight:      Height:        Intake/Output Summary (Last 24 hours) at 01/12/2020 1019 Last data filed at 01/12/2020 0522 Gross per 24 hour  Intake 1000 ml  Output --  Net 1000 ml   Filed Weights   01/09/20 1736 01/10/20 0241 01/11/20 0400  Weight: 52.1 kg 52.8 kg 53.4 kg    Examination:  General exam: Chronically ill looking, generalized weakness, lethargic HEENT: Dilated pupil on the right  respiratory system: Bilateral equal air entry, normal vesicular breath sounds, no wheezes or crackles  Cardiovascular system: S1 & S2 heard, RRR. No JVD, murmurs, rubs, gallops or clicks. No pedal edema. Gastrointestinal system: Abdomen is nondistended, soft and nontender.  Normal bowel sounds heard. Central nervous system: Not alert or oriented, left hemiparesis  extremities: No edema, discoloration of toes of left foot, healing wounds. Skin: no icterus ,no pallor      Data Reviewed: I have personally reviewed following labs and imaging studies  CBC: Recent Labs  Lab 01/08/20 0228 01/09/20 0239 01/10/20 0452 01/11/20 0242 01/12/20 0315  WBC 33.5* 35.2* 32.6* 25.1* 21.4*  NEUTROABS  --   --   --  17.7* 17.8*  HGB 7.7* 8.0* 8.6* 7.8* 7.9*  HCT 24.9* 26.0* 28.0* 25.3* 27.4*  MCV 91.5 91.5 91.2 91.7 94.5  PLT 524* 600* 630* 633* 062*   Basic Metabolic Panel: Recent Labs  Lab 01/08/20 0228 01/08/20 0228 01/09/20 0239 01/09/20 0239 01/09/20 1555 01/09/20 1555 01/09/20 1851 01/10/20 0452 01/10/20 1610 01/11/20 0242 01/12/20 0315  NA 135  --  138  --   --   --   --  138  --  142 143  K 4.5  --  4.3  --   --   --   --  3.3*  --  3.5 3.6  CL 97*  --  99  --   --   --   --  96*  --  103 108  CO2 19*  --  19*  --   --   --   --  21*  --  23 18*  GLUCOSE 216*  --  163*  --   --   --   --  256*  --  140* 126*  BUN 35*  --  58*  --   --   --   --  39*  --  52* 65*  CREATININE 4.80*  --  6.36*  --    --   --   --  4.45*  --  5.77* 7.07*  CALCIUM 7.7*  --  8.2*  --   --   --   --  7.8*  --  8.2* 8.2*  MG  --   --   --   --  2.2  --  2.1 2.3 2.5*  --   --   PHOS 5.7*   < > 5.1*   < > 2.7   < > 3.2 4.3 4.1 4.1 5.4*   < > = values in this interval not displayed.   GFR: Estimated Creatinine Clearance: 9.7 mL/min (A) (by C-G formula based on SCr of 7.07 mg/dL (H)). Liver Function Tests: Recent Labs  Lab 01/08/20 0228 01/09/20 0239 01/10/20 0452 01/11/20 0242 01/12/20 0315  ALBUMIN 1.4* 1.4* 1.4* 1.4* 1.4*   No results for input(s): LIPASE, AMYLASE in the last 168 hours. No results for input(s): AMMONIA in the last 168 hours. Coagulation Profile: No results for input(s): INR, PROTIME in the last 168 hours. Cardiac Enzymes: No results for input(s): CKTOTAL, CKMB, CKMBINDEX, TROPONINI in the last 168 hours. BNP (last 3 results) No results for input(s): PROBNP in the last 8760 hours. HbA1C: No results for input(s): HGBA1C in the last 72 hours. CBG: Recent Labs  Lab 01/11/20 1115 01/11/20 1635 01/11/20 1937 01/12/20 0030 01/12/20 0428  GLUCAP 133* 123* 105* 107* 120*   Lipid Profile: No results for input(s): CHOL, HDL, LDLCALC, TRIG, CHOLHDL, LDLDIRECT in the last 72 hours. Thyroid Function Tests: No results for input(s): TSH, T4TOTAL, FREET4, T3FREE, THYROIDAB in the last 72 hours. Anemia Panel: No results for input(s): VITAMINB12, FOLATE, FERRITIN, TIBC, IRON, RETICCTPCT in the last 72 hours. Sepsis Labs: Recent Labs  Lab 01/07/20 1603 01/07/20 1855  LATICACIDVEN 1.1 0.9    Recent Results (from the past 240 hour(s))  Culture, blood (routine x 2)     Status: None   Collection Time: 01/06/20 10:55 AM   Specimen: BLOOD  Result Value Ref Range Status   Specimen Description BLOOD RIGHT ANTECUBITAL  Final   Special Requests   Final    AEROBIC BOTTLE ONLY Blood Culture results may not be optimal due to an inadequate volume of blood received in culture bottles   Culture    Final    NO GROWTH 5 DAYS Performed at [facility], [place] [address], [place], [place] [address]    Report Status 01/11/2020 FINAL  Final  Culture, blood (routine x 2)     Status: None   Collection Time: 01/06/20 12:17 PM   Specimen: BLOOD  Result Value Ref Range Status   Specimen Description BLOOD RIGHT ANTECUBITAL  Final   Special Requests   Final    AEROBIC BOTTLE ONLY Blood Culture results may not be optimal due to an inadequate volume of blood received in culture bottles   Culture   Final    NO GROWTH 5 DAYS Performed at [facility], [place] [address], [place], [place] [address]    Report Status 01/11/2020 FINAL  Final  Culture, blood (routine x 2)     Status: None (Preliminary result)   Collection Time: 01/07/20 11:45 AM   Specimen: BLOOD  Result Value Ref Range Status   Specimen Description BLOOD HEMODIALYSIS CATHETER VENOUS  Final   Special Requests   Final    BOTTLES DRAWN AEROBIC ONLY Blood Culture adequate volume   Culture   Final    NO GROWTH 4 DAYS Performed at [facility], [place] [address],  [place], [place] [address]    Report Status PENDING  Incomplete  Culture, blood (routine x 2)     Status: None (Preliminary result)   Collection Time: 01/07/20  4:11 PM   Specimen: BLOOD  Result Value Ref Range Status   Specimen Description BLOOD HEMODIALYSIS CATHETER ARTERIAL  Final   Special Requests   Final    BOTTLES DRAWN AEROBIC AND ANAEROBIC Blood Culture adequate volume   Culture   Final    NO GROWTH 4 DAYS Performed at [facility], [place] [address], [place], [place] [address]    Report Status PENDING  Incomplete  SARS CORONAVIRUS 2 (TAT 6-24 HRS) Nasopharyngeal Nasopharyngeal Swab     Status: None   Collection Time: 01/07/20  4:49 PM   Specimen: Nasopharyngeal Swab  Result Value Ref Range Status   SARS Coronavirus 2 NEGATIVE NEGATIVE Final    Comment: (NOTE) SARS-CoV-2 target nucleic acids are NOT DETECTED. The SARS-CoV-2 RNA is generally  detectable in upper and lower respiratory specimens during the acute phase of infection. Negative results do not preclude SARS-CoV-2 infection, do not rule out co-infections with other pathogens, and should not be used as the sole basis for treatment or other patient management decisions. Negative results must be combined with clinical observations, patient history, and epidemiological information. The expected result is Negative. Fact Sheet for Patients: SugarRoll.be Fact Sheet for Healthcare Providers: https://www.woods-mathews.com/ This test is not yet approved or cleared by the [place] FDA and  has been authorized for detection and/or diagnosis of SARS-CoV-2 by FDA under an Emergency Use Authorization (EUA). This EUA will remain  in effect (meaning this test can be used) for the duration of the COVID-19 declaration under Section 56 4(b)(1) of the Act, 21 U.S.C. section 360bbb-3(b)(1), unless the authorization is terminated or revoked sooner. Performed at [facility], [place] [address] [place],  [address]          Radiology Studies: US Abdomen Complete  Result Date: 01/10/2020 CLINICAL DATA:  Acute renal injury. Groin lymphadenopathy. Ordering physician concern for abdominal malignancy. EXAM: ABDOMEN ULTRASOUND COMPLETE COMPARISON:  None. FINDINGS: Gallbladder: The gallbladder wall measures 4.8 mm. No stones. Sludge is identified. No Murphy's sign identified. There is pericholecystic fluid. Common bile duct: Diameter: 3.7 mm Liver: No focal lesion identified. Within normal limits in parenchymal echogenicity. Portal vein is patent on color Doppler imaging with normal direction of blood flow towards the liver. IVC: No abnormality visualized. Pancreas: Not visualized. Spleen: Size and appearance within normal limits. Right Kidney: Length: 9.9 cm. Increased echogenicity. No focal mass or hydronephrosis. Left Kidney: Length: 11.7  cm. Increased echogenicity. No focal mass or hydronephrosis. Abdominal aorta: The mid and distal abdominal aorta was not well visualized due to shadowing bowel gas. No aneurysm identified. Other findings: None. IMPRESSION: 1. There is gallbladder wall thickening with sludge and pericholecystic fluid. No stones or Murphy's sign. If there is concern for acute cholecystitis, recommend HIDA scan. 2. The pancreas was not visualized due to shadowing bowel gas. 3. Medical renal disease. 4. No other abnormalities are identified. Electronically Signed   By: Dorise Bullion III M.D   On: 01/10/2020 10:56   ECHOCARDIOGRAM COMPLETE BUBBLE STUDY  Result Date: 01/10/2020    ECHOCARDIOGRAM REPORT   Patient Name:   STEFEN JUBA Date of Exam: 01/10/2020 Medical Rec #:  [id number]     Height:       65.0 in Accession #:    [id number]    Weight:  116.4 lb Date of Birth:  [birth year]     BSA:          1.572 m Patient Age:    75 years      BP:           128/61 mmHg Patient Gender: M             HR:           90 bpm. Exam Location:  Inpatient Procedure: 2D Echo, Cardiac Doppler and Color Doppler Indications:    Stroke 434.91/I63.9  History:        Patient has no prior history of Echocardiogram examinations,                 most recent 11/15/2019. Risk Factors:Hypertension, Diabetes,                 Current Smoker and Dyslipidemia. Acute renal failure. CKD. CVA.  Sonographer:    Clayton Lefort RDCS (AE) Referring Phys: [id number] Melvenia Beam  Sonographer Comments: Suboptimal parasternal window. Image acquisition challenging due to uncooperative patient. Patient involuntairly moving during test, repeatedly pushing sonographer's had away from chest. Test stopped after multiple attempts to acquire images. IMPRESSIONS  1. Limited images.  2. Left ventricular ejection fraction, by estimation, is 55%. The left ventricle has normal function. Left ventricular endocardial border not optimally defined to evaluate regional wall motion. There is  mild left ventricular hypertrophy. Left ventricular diastolic function could not be evaluated.  3. Right ventricular systolic function was not well visualized. The right ventricular size is normal.  4. Small to moderate pericardial effusion. The pericardial effusion is posterior and lateral to the left ventricle.  5. The mitral valve is grossly normal. No evidence of mitral valve regurgitation.  6. The aortic valve is tricuspid. Aortic valve regurgitation is trivial. FINDINGS  Left Ventricle: Left ventricular ejection fraction, by estimation, is 55%. The left ventricle has normal function. Left ventricular endocardial border not optimally defined to evaluate regional wall motion. The left ventricular internal cavity size was normal in size. There is mild left ventricular hypertrophy. Left ventricular diastolic function could not be evaluated. Right Ventricle: The right ventricular size is normal. Right vetricular wall thickness was not assessed. Right ventricular systolic function was not well visualized. Left Atrium: Left atrial size was normal in size. Right Atrium: Right atrial size was normal in size. Pericardium: A moderately sized pericardial effusion is present. The pericardial effusion is posterior and lateral to the left ventricle. Mitral Valve: The mitral valve is grossly normal. No evidence of mitral valve regurgitation. Tricuspid Valve: The tricuspid valve is grossly normal. Tricuspid valve regurgitation not assessed. Aortic Valve: The aortic valve is tricuspid. Aortic valve regurgitation is trivial. Pulmonic Valve: The pulmonic valve was not well visualized. Pulmonic valve regurgitation not assessed. Aorta: The aortic root is normal in size and structure. Venous: The inferior vena cava was not well visualized. IAS/Shunts: The interatrial septum was not assessed.  LEFT VENTRICLE PLAX 2D LVIDd:         3.60 cm LVIDs:         2.80 cm LV PW:         1.20 cm LV IVS:        1.20 cm LVOT diam:     2.00 cm LVOT  Area:     3.14 cm  LEFT ATRIUM         Index LA diam:    2.30 cm 1.46 cm/m   AORTA Ao Root  diam: 2.60 cm  SHUNTS Systemic Diam: 2.00 cm Rozann Lesches MD Electronically signed by Rozann Lesches MD Signature Date/Time: 01/10/2020/3:05:55 PM    Final         Scheduled Meds: . aspirin  300 mg Rectal Daily  . atorvastatin  80 mg Per NG tube q1800  . Chlorhexidine Gluconate Cloth  6 each Topical Q0600  . darbepoetin (ARANESP) injection - DIALYSIS  100 mcg Intravenous Q Mon-HD  . feeding supplement (PRO-STAT SUGAR FREE 64)  30 mL Per Tube BID  . heparin  5,000 Units Subcutaneous Q8H  . insulin aspart  0-9 Units Subcutaneous Q4H  . multivitamin  1 tablet Per NG tube QHS  . sodium chloride flush  3 mL Intravenous Q12H  . ticagrelor  90 mg Per NG tube BID  . vancomycin  500 mg Intravenous Q M,W,F-HD   Continuous Infusions: . sodium chloride 75 mL/hr at 01/12/20 0505  . ceFEPime (MAXIPIME) IV Stopped (01/11/20 2052)  . feeding supplement (NEPRO CARB STEADY) 40 mL/hr at 01/10/20 0300     LOS: 12 days    Time spent:35 mins.More than 50% of that time was spent in counseling and/or coordination of care.      Shelly Coss, MD [facility] P3/22/2021, 10:19 AM

## 2020-01-12 NOTE — Progress Notes (Signed)
Progress Note    01/12/2020 7:11 AM 11 Days Post-Op  Subjective:  Tracks examiner; withdraw to pain  Patient is now postoperative day11 left femoral to below-knee popliteal bypass with reverse great saphenous vein secondary to critical limb ischemia of the left leg.  In addition, he developed acute renal failure required placement of TDC and creation of left upper extremity brachiocephalic AV fistula.  Embolic strokes. Awake, not conversant. In NAD  Vitals:   01/12/20 0000 01/12/20 0426  BP: (!) 123/98 (!) 136/59  Pulse: 92 94  Resp: 16 16  Temp: 98.9 F (37.2 C) 97.9 F (36.6 C)  SpO2: 100% 100%    Physical Exam: Cardiac:  RRR Lungs:  Non-labored Incisions:  LLE incisions healing Extremities:  Stable ischemic changes to left foot; + PT and AT doppler signals. Right foot warm, + PT  Doppler pulse Left AC incision healing; + bruit in fistula      CBC    Component Value Date/Time   WBC 21.4 (H) 01/12/2020 0315   RBC 2.90 (L) 01/12/2020 0315   HGB 7.9 (L) 01/12/2020 0315   HGB 11.3 (L) 11/12/2019 1616   HCT 27.4 (L) 01/12/2020 0315   HCT 34.1 (L) 11/12/2019 1616   PLT 659 (H) 01/12/2020 0315   PLT 527 (H) 11/12/2019 1616   MCV 94.5 01/12/2020 0315   MCV 84 11/12/2019 1616   MCH 27.2 01/12/2020 0315   MCHC 28.8 (L) 01/12/2020 0315   RDW 15.1 01/12/2020 0315   RDW 14.5 11/12/2019 1616   LYMPHSABS 0.9 01/12/2020 0315   MONOABS 1.5 (H) 01/12/2020 0315   EOSABS 1.1 (H) 01/12/2020 0315   BASOSABS 0.2 (H) 01/12/2020 0315    BMET    Component Value Date/Time   NA 143 01/12/2020 0315   NA 139 11/12/2019 1616   K 3.6 01/12/2020 0315   CL 108 01/12/2020 0315   CO2 18 (L) 01/12/2020 0315   GLUCOSE 126 (H) 01/12/2020 0315   BUN 65 (H) 01/12/2020 0315   BUN 34 (H) 11/12/2019 1616   CREATININE 7.07 (H) 01/12/2020 0315   CALCIUM 8.2 (L) 01/12/2020 0315   GFRNONAA 8 (L) 01/12/2020 0315   GFRAA 10 (L) 01/12/2020 0315     Intake/Output Summary (Last 24 hours) at  01/12/2020 0711 Last data filed at 01/12/2020 0522 Gross per 24 hour  Intake 1000 ml  Output --  Net 1000 ml    HOSPITAL MEDICATIONS Scheduled Meds: . aspirin  300 mg Rectal Daily  . atorvastatin  80 mg Per NG tube q1800  . Chlorhexidine Gluconate Cloth  6 each Topical Q0600  . darbepoetin (ARANESP) injection - DIALYSIS  100 mcg Intravenous Q Mon-HD  . feeding supplement (PRO-STAT SUGAR FREE 64)  30 mL Per Tube BID  . heparin  5,000 Units Subcutaneous Q8H  . insulin aspart  0-9 Units Subcutaneous Q4H  . multivitamin  1 tablet Per NG tube QHS  . sodium chloride flush  3 mL Intravenous Q12H  . ticagrelor  90 mg Per NG tube BID  . vancomycin  500 mg Intravenous Q M,W,F-HD   Continuous Infusions: . sodium chloride 75 mL/hr at 01/12/20 0505  . ceFEPime (MAXIPIME) IV Stopped (01/11/20 2052)  . feeding supplement (NEPRO CARB STEADY) 40 mL/hr at 01/10/20 0300   PRN Meds:.acetaminophen **OR** acetaminophen, fentaNYL (SUBLIMAZE) injection, hydrALAZINE, morphine injection, ondansetron **OR** ondansetron (ZOFRAN) IV, oxyCODONE-acetaminophen  Assessment:  49 y.o. male is s/p: Left fem- BK pop bypass with vein.  Ischemic left foot.  +  Doppler pulses  HD via [facility] Plan: -Family convening to discuss palliative care.    -DVT prophylaxis:  heparin   Risa Grill, PA-C [facility] [phone] 01/12/2020  7:11 AM

## 2020-01-12 NOTE — Progress Notes (Signed)
Pharmacy Antibiotic Note  Phillip Camacho is a 49 y.o. male admitted on 12/31/2019 with ischemic foot. Pharmacy has been consulted for Vancomycin / Cefepime dosing on 3/17 for fever, r/o sepsis.  Cultures have been negative.  New HD patient. Next HD planned for today.   Note possible plans to transition to hospice care after family arrives today.   Plan: Continue vancomycin 500 mg IV after HD MWF Cefepime 1 gram iv Q 24 hours HD orders in place for today  Height: 5\' 5"  (165.1 cm) Weight: (new bed not zeroed, was given a high weight) IBW/kg (Calculated) : 61.5  Temp (24hrs), Avg:98.3 F (36.8 C), Min:97.8 F (36.6 C), Max:98.9 F (37.2 C)  Recent Labs  Lab 01/07/20 0412 01/07/20 1603 01/07/20 1855 01/08/20 0228 01/09/20 0239 01/10/20 0452 01/11/20 0242 01/12/20 0315  WBC   < >  --   --  33.5* 35.2* 32.6* 25.1* 21.4*  CREATININE   < >  --   --  4.80* 6.36* 4.45* 5.77* 7.07*  LATICACIDVEN  --  1.1 0.9  --   --   --   --   --    < > = values in this interval not displayed.    Estimated Creatinine Clearance: 9.7 mL/min (A) (by C-G formula based on SCr of 7.07 mg/dL (H)).    No Known Allergies  Vancomycin 3/17> Cefepime 3/17>  3/16 blood x2- ng 3/17 blood x2- ngtd  Erin Hearing PharmD., BCPS Clinical Pharmacist 01/12/2020 11:07 AM

## 2020-01-12 NOTE — Progress Notes (Signed)
Daily Progress Note   Patient Name: Phillip Camacho       Date: 01/12/2020 DOB: 01/01/1971  Age: 49 y.o. MRN#: [id number] Attending Physician: Shelly Coss, MD Primary Care Physician: [name], MD Admit Date: 12/31/2019  Reason for Consultation/Follow-up: Establishing goals of care  Subjective: Patient will open eyes to voice. Does not follow commands. Nonverbal. No s/s of pain or discomfort.   GOC:  No family at bedside. Spoke with daughter, [name] via telephone to discuss plan of care. Reiterated conversation with Billey Chang, NP yesterday including diagnoses, interventions, poor prognosis. [name] confirms plan for comfort focus care and family decision for hospice home. [name] is unable to find a flight for today but hopes to arrive from [place] tomorrow. Answered many questions regarding plan of care, visitor policy, and hospice philosophy. [name] shares that her father is Catholic and she would like a Clinical biochemist or preferably a Catholic priest to visit for last rites if possible. Answered questions and concerns. PMT contact information given.   Length of Stay: 12  Current Medications: Scheduled Meds:  . aspirin  300 mg Rectal Daily  . atorvastatin  80 mg Per NG tube q1800  . Chlorhexidine Gluconate Cloth  6 each Topical Q0600  . darbepoetin (ARANESP) injection - DIALYSIS  100 mcg Intravenous Q Mon-HD  . feeding supplement (PRO-STAT SUGAR FREE 64)  30 mL Per Tube BID  . heparin  5,000 Units Subcutaneous Q8H  . insulin aspart  0-9 Units Subcutaneous Q4H  . multivitamin  1 tablet Per NG tube QHS  . sodium chloride flush  3 mL Intravenous Q12H  . ticagrelor  90 mg Per NG tube BID  . vancomycin  500 mg Intravenous Q M,W,F-HD    Continuous Infusions: . sodium chloride 75 mL/hr  at 01/12/20 0505  . ceFEPime (MAXIPIME) IV Stopped (01/11/20 2052)  . feeding supplement (NEPRO CARB STEADY) 40 mL/hr at 01/10/20 0300    PRN Meds: acetaminophen **OR** acetaminophen, fentaNYL (SUBLIMAZE) injection, hydrALAZINE, morphine injection, ondansetron **OR** ondansetron (ZOFRAN) IV, oxyCODONE-acetaminophen  Physical Exam Vitals and nursing note reviewed.  Constitutional:      Appearance: He is ill-appearing.  HENT:     Head: Normocephalic and atraumatic.  Cardiovascular:     Rate and Rhythm: Regular rhythm.  Pulmonary:     Effort: No tachypnea, accessory  muscle usage or respiratory distress.     Breath sounds: Normal breath sounds.     Comments: Room air Abdominal:     Tenderness: There is no abdominal tenderness.  Skin:    General: Skin is warm and dry.  Neurological:     Comments: Opens eyes to voice. Does not follow commands. Nonverbal.   Psychiatric:        Attention and Perception: He is inattentive.        Speech: He is noncommunicative.            Vital Signs: BP 130/65 (BP Location: Right Arm)   Pulse (!) 34   Temp 97.8 F (36.6 C) (Axillary)   Resp 14   Ht 5\' 5"  (1.651 m)   Wt 53.4 kg   SpO2 98%   BMI 19.59 kg/m  SpO2: SpO2: 98 % O2 Device: O2 Device: Room Air O2 Flow Rate: O2 Flow Rate (L/min): 2 L/min  Intake/output summary:   Intake/Output Summary (Last 24 hours) at 01/12/2020 1050 Last data filed at 01/12/2020 0522 Gross per 24 hour  Intake 1000 ml  Output --  Net 1000 ml   LBM: Last BM Date: 01/12/20 Baseline Weight: Weight: 52.6 kg Most recent weight: Weight: (new bed not zeroed, was given a high weight)       Palliative Assessment/Data: PPS 10%      Patient Active Problem List   Diagnosis Date Noted  . Protein-calorie malnutrition, severe 01/11/2020  . Palliative care encounter   . Palliative care by specialist   . Goals of care, counseling/discussion   . Pressure injury of skin 01/08/2020  . PAD (peripheral artery  disease) ([place]) 12/31/2019  . Pain of left lower extremity due to ischemia   . Hyperlipidemia   . CKD (chronic kidney disease), stage IV ([place]) 11/14/2019  . Hypertension 11/14/2019  . Hyperkalemia 11/14/2019  . 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 Acute renal failure superimposed on stage 4 chronic kidney disease ([place]) 09/17/2019  . Nephrotic syndrome 09/17/2019  . Insulin-requiring or dependent type II diabetes mellitus ([place]) 09/17/2019  . Nephrotic syndrome in diabetes mellitus ([place]) 09/17/2019  . Acute CVA (cerebrovascular accident) ([place]) 08/11/2019  . DM (diabetes mellitus) ([place]) 08/11/2019  . Benign hypertension with chronic kidney disease, stage III 08/11/2019  . Tobacco abuse 08/11/2019  . CKD stage 4 due to type 2 diabetes mellitus ([place]) 08/11/2019    Palliative Care Assessment & Plan   Patient Profile: Phillip Camacho is a 49 y.o. 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  Patient underwent left knee popliteal bypass.  He subsequently required initiation of hemodialysis for ESRD.  Patient subsequently suffered additional strokes as seen on MRI.  He is been persistently encephalopathic and treated on antibiotics in the setting of presumed infection.  He has pulled his NGT making feeding difficult.  Palliative care was consulted to help address goals with family.  Assessment: Critical left leg ischemia s/p left femoral knee popliteal bypass Progressive CKD stage IV now ESRD receiving dialysis Multiple acute embolic strokes Acute encephalopathy Sepsis with fever Leukocytosis Hx of CVA's  Recommendations/Plan:  After [place] discussion 01/11/20, family decision against prolonged heroic measures and to shift towards comfort measures with transfer to hospice facility.  However, family does not want patient to transfer to hospice home until patient's daughter (  [name]) arrives from [place]. Ashley hopes to get here by 3/23.   Will begin shift to comfort measures as multiple oral medications that patient cannot take. Do not replace cortrak as shifting to comfort. Will continue IVF/ABX for now.  No further dialysis.  Symptom management:  Dilaudid 0.5mg  IV q3h prn pain/dyspnea/air hunger/tachypnea  Ativan 0.5mg  IV q6h prn anxiety  Robinul 0.2mg  IV q4h prn secretions  Unrestricted visitor access  Spiritual care consult. Daughter requesting Catholic priest.    Hospice facility referral when family ready.   Code Status:   DNR   Code Status Orders  (From admission, onward)         Start     Ordered   01/11/20 1706  Do not attempt resuscitation (DNR)  Continuous    Question Answer Comment  In the event of cardiac or respiratory ARREST Do not call a "code blue"   In the event of cardiac or respiratory ARREST Do not perform Intubation, CPR, defibrillation or ACLS   In the event of cardiac or respiratory ARREST Use medication by any route, position, wound care, and other measures to relive pain and suffering. May use oxygen, suction and manual treatment of airway obstruction as needed for comfort.      01/11/20 1705        Code Status History    Date Active Date Inactive Code Status Order ID Comments User Context   01/01/2020 1224 01/11/2020 1705 Full Code [id number]  Ortencia Kick Inpatient   12/31/2019 2154 01/01/2020 1224 Full Code [id number]  Vianne Bulls, MD Inpatient   11/14/2019 1656 11/18/2019 2047 Full Code [id number]  Damita Lack, MD ED   09/30/2019 1126 10/02/2019 2131 Full Code [id number]  [name] Roan, MD ED   08/11/2019 2040 08/14/2019 1737 Full Code [id number]  Alma Friendly, MD ED   Advance Care Planning Activity       Prognosis:   Poor prognosis  Discharge Planning:  Hospice facility   Care plan  was discussed with Dr. Tawanna Solo, RN, RN CM, daughter [name])  Thank you for allowing the Palliative Medicine Team to assist in the care of this patient.   Time In: 1010 1230- Time Out: 6004 5997 Total Time 55 Prolonged Time Billed  no      Greater than 50%  of this time was spent counseling and coordinating care related to the above assessment and plan.  Ihor Dow, DNP FNP-C Palliative Medicine Team  Phone: [phone] Fax: [phone]  Please contact Palliative Medicine Team phone at [phone] for questions and concerns.

## 2020-01-12 NOTE — Progress Notes (Signed)
Patient ID: Phillip Camacho, male   DOB: [birth year], 49 y.o.   MRN: [id number] Plan for comfort measures and to stop hemodialysis noted.  Please call if we can assist

## 2020-01-12 NOTE — Progress Notes (Signed)
   01/12/20 1400  Clinical Encounter Type  Visited With Family  Visit Type Follow-up  Referral From Palliative care team  Consult/Referral To Chaplain  Spiritual Encounters  Spiritual Needs  (None)  Stress Factors  Patient Stress Factors Major life changes  Family Stress Factors Major life changes   Chaplain responded to consult for end of life care, "Catholic [place], if possible." Chaplain contacted Mr. Jaroszewski son [name] and was informed that Mr. Fidela [place]au is no a religious man. No Priest needed per pts son [name].  Chaplain remains available for support as needs arise.   Chaplain Resident, Evelene Croon, M Div [phone] on-call pager

## 2020-01-12 NOTE — Consult Note (Signed)
[facility] Nurse Consult Note: Patient receiving care at [facility], patient encephalopathic at this time. Reason for Consult:Wound to sacrum and scrotum. Wound type:Patient has MASD to the scrotum measuring 2.0 cm x 0.9 cm the area is eighty percent white with a centralized area that is what appears to be a moist brown scab like appearance covering twenty percent of the wound bed.  The wound bed is extremely moist with clear drainage and no odor present at this time.  Order placed to apply a small piece of Aquacel (located in unit supply room) into the wound bed of the scrotum and cover with a small square foam.  Change the Aquacel daily, may lift foam and change the Aquacel piece and may re-cover with foam.  Change the foam every three days or PRN for soilage.  Patient has an un-stageable to the sacrum measuring 6.1 cm x 8.3 cm the skin is black and non blanchable with a twenty percent area that has peeled off the top layer of dermis.  The wound is dry accept for the open area which is moist with no foul odor present.  Order place to apply a foam dressing to the un-stageable sacral wound, change every three days or PRN for soilage.  This wound will most likely evolve and require further interventions.   Pressure Injury POA: No  Monitor the wound area's for worsening of condition such as, Signs/symptoms of infection, Increase in size, development of or worsening of odor, development of pain, or increased pain at the affected locations.  Notify the medical team if any of these develop.  Thank you for the consult.  Discussed plan of care with the patient and bedside nurse.  [facility] nurse will continue to follow.    Austin Miles, RN, [employer]

## 2020-01-12 NOTE — Progress Notes (Signed)
Error, duplicate note deleted

## 2020-01-12 NOTE — Progress Notes (Signed)
[facility] Progress Note   Assessment/ Plan:   Pt is a 49 y.o. yo male with DM. HTN, history of CVA's and progressive CKD-  Followed by Dr. Durwin Reges mose recently who was admitted on 12/31/2019 with an ischemic foot   Assessment/Plan: Pt to transition to comfort care when daughter arrives - per notes should be today.  There are no indications for dialysis at this time and I will not plan for further dialysis as it will not change outcome.    I will sign off, please let me know if I can be of any assistance   Subjective:    No overall change.  DNR and transition to comfort care when daughter arrives.    Objective:   BP 130/65 (BP Location: Right Arm)   Pulse (!) 34   Temp 97.8 F (36.6 C) (Axillary)   Resp 14   Ht 5\' 5"  (1.651 m)   Wt 53.4 kg   SpO2 98%   BMI 19.59 kg/m   Physical Exam: Gen: sleeping, arouses to voice CVS: RRR Resp: clear bilaterally  Abd: soft Ext: no LE edema, L leg with well-healing incisions Ischemic changes of the toes. ACCESS: R IJ TDC, L AVF + T/B  Labs: BMET Recent Labs  Lab 01/06/20 0133 01/06/20 0133 01/07/20 8295 01/07/20 6213 01/08/20 0228 01/08/20 0228 01/09/20 0239 01/09/20 1555 01/09/20 1851 01/10/20 0452 01/10/20 1610 01/11/20 0242 01/12/20 0315  NA 140  --  139  --  135  --  138  --   --  138  --  142 143  K 4.3  --  4.5  --  4.5  --  4.3  --   --  3.3*  --  3.5 3.6  CL 99  --  97*  --  97*  --  99  --   --  96*  --  103 108  CO2 22  --  22  --  19*  --  19*  --   --  21*  --  23 18*  GLUCOSE 88  --  139*  --  216*  --  163*  --   --  256*  --  140* 126*  BUN 19  --  43*  --  35*  --  58*  --   --  39*  --  52* 65*  CREATININE 3.77*  --  6.27*  --  4.80*  --  6.36*  --   --  4.45*  --  5.77* 7.07*  CALCIUM 8.1*  --  8.2*  --  7.7*  --  8.2*  --   --  7.8*  --  8.2* 8.2*  PHOS 4.1   < > 5.1*   < > 5.7*   < > 5.1* 2.7 3.2 4.3 4.1 4.1 5.4*   < > = values in this interval not displayed.   CBC Recent Labs   Lab 01/09/20 0239 01/10/20 0452 01/11/20 0242 01/12/20 0315  WBC 35.2* 32.6* 25.1* 21.4*  NEUTROABS  --   --  17.7* 17.8*  HGB 8.0* 8.6* 7.8* 7.9*  HCT 26.0* 28.0* 25.3* 27.4*  MCV 91.5 91.2 91.7 94.5  PLT 600* 630* 633* 659*      Medications:    . aspirin  300 mg Rectal Daily  . atorvastatin  80 mg Per NG tube q1800  . Chlorhexidine Gluconate Cloth  6 each Topical Q0600  . darbepoetin (ARANESP) injection - DIALYSIS  100 mcg Intravenous Q Mon-HD  .  feeding supplement (PRO-STAT SUGAR FREE 64)  30 mL Per Tube BID  . heparin  5,000 Units Subcutaneous Q8H  . insulin aspart  0-9 Units Subcutaneous Q4H  . multivitamin  1 tablet Per NG tube QHS  . sodium chloride flush  3 mL Intravenous Q12H  . ticagrelor  90 mg Per NG tube BID  . vancomycin  500 mg Intravenous Q M,W,F-HD    Jannifer Hick MD [facility] Pager [phone]

## 2020-01-13 DIAGNOSIS — E43 Unspecified severe protein-calorie malnutrition: Secondary | ICD-10-CM

## 2020-01-13 MED ORDER — DEXTROSE 5 % IV SOLN
500.0000 mg | INTRAVENOUS | Status: DC
  Filled 2020-01-13 (×2): qty 0.5

## 2020-01-13 NOTE — Progress Notes (Signed)
PROGRESS NOTE    Phillip Camacho  VCB:[id number] DOB: 1971-03-24 DOA: 12/31/2019 PCP: [name], MD   Brief Narrative:  Patient is a 49 year old male with history of insulin-dependent diabetes mellitus hypertension, CKD stage IV, recurrent CVA with left-sided residual hemiparesis who was brought to the emergency department with complaints of left leg pain for 4 weeks.  He was last hospitalized in January 2021 and for management of stroke.  Since that time, he has been confused, not eating or drinking, needing supervision with ADL s.  In the emergency department, he was found to be hyperkalemic, has severe AKI with creatinine of 5.6, leukocytosis.  He was found to have critical left lower extremity ischemia.  Admitted for the management of limb ischemia, hyperkalemia, renal failure.  Vascular surgery was consulted and he underwent left femoral to below-knee popliteal bypass on 3/11.  Nephrology was also following and started on dialysis after placing temporary dialysis catheter.  Due to persistent encephalopathy, MRI was done which showed bilateral multiple strokes. Given his multiple comorbidities, palliative care was consulted.  Patient is now  DNR.  Now waiting for final discussion with daughter before transitioning his care to full comfort care/residential hospice transfer.  Assessment & Plan:   Principal Problem:   Hyperkalemia Active Problems:   Acute renal failure superimposed on stage 4 chronic kidney disease ([place])   Insulin-requiring or dependent type II diabetes mellitus ([place])   Hypertension   Pressure injury of skin   Palliative care by specialist   Goals of care, counseling/discussion   Protein-calorie malnutrition, severe   Palliative care encounter   ESRD (end stage renal disease) ([place])   Progressively  CKD stage 4 now ESRD: Secondary to poorly controlled diabetes, hypertension.  Nephrology following.  Underwent tunneled dialysis catheter placement and initiation of dialysis  on 3/12.  AV fistula placed on 3/15.  He has been transitioned to comfort care, dialysis stopped.  Critical limb ischemia of left lower extremity: Presented with left foot pain.  Status post left femoral to below-knee popliteal bypass with reverse saphenous  vein on 3/11. He was started  on aspirin, Brilinta, Lipitor.  Left leg is erythematous and left 3-5 toes are blackish and getting demarcated.  No evidence of DVT.  Vascular surgery was following.  Sepsis with fever: Hospital course remarkable for intermittent temperatures spike.  Chest x-ray on 3/17 did not show any pneumonia.  Blood cultures have not shown any growth.  On empiric vancomycin and cefepime which was started on 3/17.  Has severe leukocytosis.  Acute encephalopathy: Most likely multifactorial associated with bilateral multiple strokes as per MRI, sepsis, uremia.  Continues to remain lethargic.  He was initially on tube feed but he pulled it himself, no plan to put another one.  Multiple acute embolic strokes: Seen by MRI on 3/17.  He has history of strokes in the past.  EEG showed diffuse encephalopathy.  LDL of 28.  Hemoglobin A1c of 7.3.  Severe hyperkalemia: Resolved  Insulin-dependent diabetes type 2: Hemoglobin A1c 7.3 as per 3/17.    Hypertension: Currently blood pressure stable  Hyperlipidemia: He was on statin statin  Smoker: He was smoking before admission.  Normocytic anemia: Most likely associated with CKD.  Currently  hemoglobin stable.No plan to check blood work now   Multiple chronically/poor prognosis/debility/deconditioning: Palliative  care closely following.  Currently DNR.  Palliative care planning to meet with the daughter.  Family interested on transition to full comfort care/residential hospice  Pressure Injury 01/07/20 Sacrum Stage 2 -  Partial thickness loss of dermis presenting as a shallow open injury with a red, pink wound bed without slough. (Active)  01/07/20   Location: Sacrum  Location  Orientation:   Staging: Stage 2 -  Partial thickness loss of dermis presenting as a shallow open injury with a red, pink wound bed without slough.  Wound Description (Comments):   Present on Admission:      Pressure Injury 01/10/20 Scrotum Posterior Stage 2 -  Partial thickness loss of dermis presenting as a shallow open injury with a red, pink wound bed without slough. open area yellow with some yellow drainage pink arount it sl swollen (Active)  01/10/20 2000  Location: Scrotum  Location Orientation: Posterior  Staging: Stage 2 -  Partial thickness loss of dermis presenting as a shallow open injury with a red, pink wound bed without slough.  Wound Description (Comments): open area yellow with some yellow drainage pink arount it sl swollen  Present on Admission:      Pressure Injury 01/10/20 Heel Right Unstageable - Full thickness tissue loss in which the base of the injury is covered by slough (yellow, tan, gray, green or brown) and/or eschar (tan, brown or black) in the wound bed. small black area redden around it (Active)  01/10/20 2000  Location: Heel  Location Orientation: Right  Staging: Unstageable - Full thickness tissue loss in which the base of the injury is covered by slough (yellow, tan, gray, green or brown) and/or eschar (tan, brown or black) in the wound bed.  Wound Description (Comments): small black area redden around it  Present on Admission:              Nutrition Problem: Severe Malnutrition Etiology: chronic illness(CKD, recurrent CVAs)      DVT prophylaxis:SCD,compression stockings Code Status: Full Family Communication: Called daughter [name] twice, went into voice mail.  Palliative care closely communicating with the family. Disposition Plan: Patient is from home.  PT recommended skilled nursing facility.  Palliative  care following.  Discharge plan to residential hospice after talking to the daughter.  Consultants: Palliative care, nephrology,  vascular surgery  Procedures: As above  Antimicrobials:  Anti-infectives (From admission, onward)   Start     Dose/Rate Route Frequency Ordered Stop   01/13/20 2000  ceFEPIme (MAXIPIME) 500 mg in dextrose 5 % 50 mL IVPB     500 mg 100 mL/hr over 30 Minutes Intravenous Every 24 hours 01/13/20 1110     01/08/20 2000  ceFEPIme (MAXIPIME) 1 g in sodium chloride 0.9 % 100 mL IVPB  Status:  Discontinued     1 g 200 mL/hr over 30 Minutes Intravenous Every 24 hours 01/07/20 0858 01/13/20 1110   01/07/20 1200  vancomycin (VANCOCIN) IVPB 500 mg/100 ml premix  Status:  Discontinued     500 mg 100 mL/hr over 60 Minutes Intravenous Every M-W-F (Hemodialysis) 01/07/20 0858 01/13/20 1110   01/07/20 1000  vancomycin (VANCOREADY) IVPB 1250 mg/250 mL     1,250 mg 166.7 mL/hr over 90 Minutes Intravenous To Hemodialysis 01/07/20 0856 01/07/20 1058   01/07/20 1000  ceFEPIme (MAXIPIME) 1 g in sodium chloride 0.9 % 100 mL IVPB     1 g 200 mL/hr over 30 Minutes Intravenous  Once 01/07/20 0856 01/07/20 1252   01/06/20 1130  doxycycline (VIBRA-TABS) tablet 100 mg  Status:  Discontinued     100 mg Oral Every 12 hours 01/06/20 1045 01/07/20 0838   01/05/20 0000  ceFAZolin (ANCEF) IVPB 1 g/50 mL premix  Note to Pharmacy: Send with pt to OR   1 g 100 mL/hr over 30 Minutes Intravenous On call 01/04/20 1134 01/05/20 0735   01/02/20 0927  ceFAZolin (ANCEF) 2-4 GM/100ML-% IVPB    Note to Pharmacy: [name]   : cabinet override      01/02/20 0927 01/02/20 0945   01/01/20 1600  ceFAZolin (ANCEF) IVPB 2g/100 mL premix     2 g 200 mL/hr over 30 Minutes Intravenous Every 8 hours 01/01/20 1224 01/02/20 0125   01/01/20 0706  ceFAZolin (ANCEF) 2-4 GM/100ML-% IVPB    Note to Pharmacy: Lelon Perla   : cabinet override      01/01/20 0706 01/01/20 1914   12/31/19 1745  ceFAZolin (ANCEF) IVPB 2g/100 mL premix     2 g 200 mL/hr over 30 Minutes Intravenous On call to O.R. 12/31/19 1730 01/01/20 0559       Subjective: Patient seen and examined the bedside this morning.  Hemodynamically stable.  Remains unresponsive, shows on purposeful movement.Not in acute distress  Objective: Vitals:   01/12/20 1200 01/12/20 2000 01/13/20 0500 01/13/20 0936  BP: (!) 151/71 (!) 147/74  (!) 153/57  Pulse:    86  Resp: 12 15  16   Temp:  97.8 F (36.6 C)  97.6 F (36.4 C)  TempSrc:  Axillary  Axillary  SpO2:  93%    Weight:   68.9 kg   Height:        Intake/Output Summary (Last 24 hours) at 01/13/2020 1140 Last data filed at 01/13/2020 0400 Gross per 24 hour  Intake 1842.94 ml  Output --  Net 1842.94 ml   Filed Weights   01/10/20 0241 01/11/20 0400 01/13/20 0500  Weight: 52.8 kg 53.4 kg 68.9 kg    Examination:  General exam: Unresponsive, lethargic  HEENT: eyes closed Respiratory system:  no wheezes or crackles  Cardiovascular system: S1 & S2 heard, RRR. No JVD, murmurs, rubs, gallops or clicks. Gastrointestinal system: Abdomen is nondistended, soft and nontender. No organomegaly or masses felt. Normal bowel sounds heard. Central nervous system: Not Alert or awake.  On purposeful movement.  Responds to verbal/painful stimuli only by opening the eyes .  Left hemiparesis extremities: No edema, discoloration of toes of left foot, healing wounds. Skin: no icterus ,no pallor   Data Reviewed: I have personally reviewed following labs and imaging studies  CBC: Recent Labs  Lab 01/08/20 0228 01/09/20 0239 01/10/20 0452 01/11/20 0242 01/12/20 0315  WBC 33.5* 35.2* 32.6* 25.1* 21.4*  NEUTROABS  --   --   --  17.7* 17.8*  HGB 7.7* 8.0* 8.6* 7.8* 7.9*  HCT 24.9* 26.0* 28.0* 25.3* 27.4*  MCV 91.5 91.5 91.2 91.7 94.5  PLT 524* 600* 630* 633* 856*   Basic Metabolic Panel: Recent Labs  Lab 01/08/20 0228 01/08/20 0228 01/09/20 0239 01/09/20 0239 01/09/20 1555 01/09/20 1555 01/09/20 1851 01/10/20 0452 01/10/20 1610 01/11/20 0242 01/12/20 0315  NA 135  --  138  --   --   --   --   138  --  142 143  K 4.5  --  4.3  --   --   --   --  3.3*  --  3.5 3.6  CL 97*  --  99  --   --   --   --  96*  --  103 108  CO2 19*  --  19*  --   --   --   --  21*  --  23 18*  GLUCOSE 216*  --  163*  --   --   --   --  256*  --  140* 126*  BUN 35*  --  58*  --   --   --   --  39*  --  52* 65*  CREATININE 4.80*  --  6.36*  --   --   --   --  4.45*  --  5.77* 7.07*  CALCIUM 7.7*  --  8.2*  --   --   --   --  7.8*  --  8.2* 8.2*  MG  --   --   --   --  2.2  --  2.1 2.3 2.5*  --   --   PHOS 5.7*   < > 5.1*   < > 2.7   < > 3.2 4.3 4.1 4.1 5.4*   < > = values in this interval not displayed.   GFR: Estimated Creatinine Clearance: 11.1 mL/min (A) (by C-G formula based on SCr of 7.07 mg/dL (H)). Liver Function Tests: Recent Labs  Lab 01/08/20 0228 01/09/20 0239 01/10/20 0452 01/11/20 0242 01/12/20 0315  ALBUMIN 1.4* 1.4* 1.4* 1.4* 1.4*   No results for input(s): LIPASE, AMYLASE in the last 168 hours. No results for input(s): AMMONIA in the last 168 hours. Coagulation Profile: No results for input(s): INR, PROTIME in the last 168 hours. Cardiac Enzymes: No results for input(s): CKTOTAL, CKMB, CKMBINDEX, TROPONINI in the last 168 hours. BNP (last 3 results) No results for input(s): PROBNP in the last 8760 hours. HbA1C: No results for input(s): HGBA1C in the last 72 hours. CBG: Recent Labs  Lab 01/11/20 1635 01/11/20 1937 01/12/20 0030 01/12/20 0428 01/12/20 1137  GLUCAP 123* 105* 107* 120* 134*   Lipid Profile: No results for input(s): CHOL, HDL, LDLCALC, TRIG, CHOLHDL, LDLDIRECT in the last 72 hours. Thyroid Function Tests: No results for input(s): TSH, T4TOTAL, FREET4, T3FREE, THYROIDAB in the last 72 hours. Anemia Panel: No results for input(s): VITAMINB12, FOLATE, FERRITIN, TIBC, IRON, RETICCTPCT in the last 72 hours. Sepsis Labs: Recent Labs  Lab 01/07/20 1603 01/07/20 1855  LATICACIDVEN 1.1 0.9    Recent Results (from the past 240 hour(s))  Culture, blood  (routine x 2)     Status: None   Collection Time: 01/06/20 10:55 AM   Specimen: BLOOD  Result Value Ref Range Status   Specimen Description BLOOD RIGHT ANTECUBITAL  Final   Special Requests   Final    AEROBIC BOTTLE ONLY Blood Culture results may not be optimal due to an inadequate volume of blood received in culture bottles   Culture   Final    NO GROWTH 5 DAYS Performed at [facility], [place] [address], [place], [place] [address]    Report Status 01/11/2020 FINAL  Final  Culture, blood (routine x 2)     Status: None   Collection Time: 01/06/20 12:17 PM   Specimen: BLOOD  Result Value Ref Range Status   Specimen Description BLOOD RIGHT ANTECUBITAL  Final   Special Requests   Final    AEROBIC BOTTLE ONLY Blood Culture results may not be optimal due to an inadequate volume of blood received in culture bottles   Culture   Final    NO GROWTH 5 DAYS Performed at [facility], [place] [address] [place], [place] [address]    Report Status 01/11/2020 FINAL  Final  Culture, blood (routine x 2)     Status:  None   Collection Time: 01/07/20 11:45 AM   Specimen: BLOOD  Result Value Ref Range Status   Specimen Description BLOOD HEMODIALYSIS CATHETER VENOUS  Final   Special Requests   Final    BOTTLES DRAWN AEROBIC ONLY Blood Culture adequate volume   Culture   Final    NO GROWTH 5 DAYS Performed at [facility], [address], [place], [place] [address]    Report Status 01/12/2020 FINAL  Final  Culture, blood (routine x 2)     Status: None   Collection Time: 01/07/20  4:11 PM   Specimen: BLOOD  Result Value Ref Range Status   Specimen Description BLOOD HEMODIALYSIS CATHETER ARTERIAL  Final   Special Requests   Final    BOTTLES DRAWN AEROBIC AND ANAEROBIC Blood Culture adequate volume   Culture   Final    NO GROWTH 5 DAYS Performed at [facility], [place] [address], [place],  [address]    Report Status 01/12/2020 FINAL  Final  SARS CORONAVIRUS 2 (TAT 6-24  HRS) Nasopharyngeal Nasopharyngeal Swab     Status: None   Collection Time: 01/07/20  4:49 PM   Specimen: Nasopharyngeal Swab  Result Value Ref Range Status   SARS Coronavirus 2 NEGATIVE NEGATIVE Final    Comment: (NOTE) SARS-CoV-2 target nucleic acids are NOT DETECTED. The SARS-CoV-2 RNA is generally detectable in upper and lower respiratory specimens during the acute phase of infection. Negative results do not preclude SARS-CoV-2 infection, do not rule out co-infections with other pathogens, and should not be used as the sole basis for treatment or other patient management decisions. Negative results must be combined with clinical observations, patient history, and epidemiological information. The expected result is Negative. Fact Sheet for Patients: SugarRoll.be Fact Sheet for Healthcare Providers: https://www.woods-mathews.com/ This test is not yet approved or cleared by the [place] FDA and  has been authorized for detection and/or diagnosis of SARS-CoV-2 by FDA under an Emergency Use Authorization (EUA). This EUA will remain  in effect (meaning this test can be used) for the duration of the COVID-19 declaration under Section 56 4(b)(1) of the Act, 21 U.S.C. section 360bbb-3(b)(1), unless the authorization is terminated or revoked sooner. Performed at [facility], [place] [address] [place],  [address]          Radiology Studies: No results found.      Scheduled Meds: . aspirin  300 mg Rectal Daily  . ceFEPime (MAXIPIME) IV  500 mg Intravenous Q24H  . Chlorhexidine Gluconate Cloth  6 each Topical Q0600  . darbepoetin (ARANESP) injection - DIALYSIS  100 mcg Intravenous Q Mon-HD  . sodium chloride flush  3 mL Intravenous Q12H  . ticagrelor  90 mg Per NG tube BID   Continuous Infusions: . sodium chloride 75 mL/hr at 01/13/20 0610     LOS: 13 days    Time spent:35 mins.More than 50% of that time was  spent in counseling and/or coordination of care.      Shelly Coss, MD [facility] P3/23/2021, 11:40 AM

## 2020-01-13 NOTE — Progress Notes (Signed)
Nutrition Brief Note  Chart reviewed. Pt now transitioning to comfort care.  No further nutrition interventions warranted at this time.  Please re-consult as needed.   Mariana Single RD, LDN Clinical Nutrition Pager listed in [place]

## 2020-01-13 NOTE — Progress Notes (Signed)
Daily Progress Note   Patient Name: Phillip Camacho       Date: 01/13/2020 DOB: 11/09/1970  Age: 49 y.o. MRN#: [id number] Attending Physician: Shelly Coss, MD Primary Care Physician: [name], MD Admit Date: 12/31/2019  Reason for Consultation/Follow-up: Establishing goals of care  Subjective: Eyes open but patient does not track or follow commands. Nonverbal. No s/s of pain or discomfort.   No family at bedside.   Spoke with daughter, Phillip Camacho this afternoon. Phillip Camacho is on the way from [place] and should arrive to [place] by this evening. Answered many questions about plan of care including course of hospitalization, diagnoses, interventions, comfort focused care. Reviewed neurology notes. Daughter tearful and hoping she and family are making the right decision. Discussed poor meaningful recovery following multiple insults to the brain and best case scenario being dependent on feeding tube and need for long-term care facility. Answered all questions. Emotional support provided.   Length of Stay: 13  Current Medications: Scheduled Meds:  . aspirin  300 mg Rectal Daily  . Chlorhexidine Gluconate Cloth  6 each Topical Q0600  . darbepoetin (ARANESP) injection - DIALYSIS  100 mcg Intravenous Q Mon-HD  . sodium chloride flush  3 mL Intravenous Q12H  . ticagrelor  90 mg Per NG tube BID  . vancomycin  500 mg Intravenous Q M,W,F-HD    Continuous Infusions: . sodium chloride 75 mL/hr at 01/13/20 0610  . ceFEPime (MAXIPIME) IV Stopped (01/12/20 2038)    PRN Meds: acetaminophen **OR** acetaminophen, glycopyrrolate, hydrALAZINE, HYDROmorphone (DILAUDID) injection, LORazepam, ondansetron **OR** ondansetron (ZOFRAN) IV, oxyCODONE  Physical Exam Vitals and nursing note reviewed.    Constitutional:      Appearance: He is ill-appearing.  HENT:     Head: Normocephalic and atraumatic.  Cardiovascular:     Rate and Rhythm: Regular rhythm.  Pulmonary:     Effort: No tachypnea, accessory muscle usage or respiratory distress.     Breath sounds: Normal breath sounds.     Comments: Room air Abdominal:     Tenderness: There is no abdominal tenderness.  Skin:    General: Skin is warm and dry.  Neurological:     Comments: Eyes open intermittently. Does not follow commands. Nonverbal.   Psychiatric:        Attention and Perception: He is inattentive.  Speech: He is noncommunicative.            Vital Signs: BP (!) 153/57   Pulse 86   Temp 97.6 F (36.4 C) (Axillary)   Resp 16   Ht 5\' 5"  (1.651 m)   Wt 68.9 kg Comment: took equipment off end of bed  SpO2 93%   BMI 25.29 kg/m  SpO2: SpO2: 93 % O2 Device: O2 Device: Room Air O2 Flow Rate: O2 Flow Rate (L/min): 2 L/min  Intake/output summary:   Intake/Output Summary (Last 24 hours) at 01/13/2020 1018 Last data filed at 01/13/2020 0400 Gross per 24 hour  Intake 1842.94 ml  Output --  Net 1842.94 ml   LBM: Last BM Date: 01/12/20 Baseline Weight: Weight: 52.6 kg Most recent weight: Weight: 68.9 kg(took equipment off end of bed)       Palliative Assessment/Data: PPS 10%    Flowsheet Rows     Most Recent Value  Intake Tab  Referral Department  Hospitalist  Unit at Time of Referral  Cardiac/Telemetry Unit  Palliative Care Primary Diagnosis  Nephrology  Date Notified  01/08/20  Palliative Care Type  New Palliative care  Reason for referral  Clarify Goals of Care  Date of Admission  12/31/19  Date first seen by Palliative Care  01/10/20  # of days Palliative referral response time  2 Day(s)  # of days IP prior to Palliative referral  8  Clinical Assessment  Psychosocial & Spiritual Assessment  Palliative Care Outcomes      Patient Active Problem List   Diagnosis Date Noted  . ESRD (end stage  renal disease) ([place])   . Protein-calorie malnutrition, severe 01/11/2020  . Palliative care encounter   . Palliative care by specialist   . Goals of care, counseling/discussion   . Pressure injury of skin 01/08/2020  . PAD (peripheral artery disease) ([place]) 12/31/2019  . Pain of left lower extremity due to ischemia   . Hyperlipidemia   . CKD (chronic kidney disease), stage IV ([place]) 11/14/2019  . Hypertension 11/14/2019  . Hyperkalemia 11/14/2019  . 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 Acute renal failure superimposed on stage 4 chronic kidney disease ([place]) 09/17/2019  . Nephrotic syndrome 09/17/2019  . Insulin-requiring or dependent type II diabetes mellitus ([place]) 09/17/2019  . Nephrotic syndrome in diabetes mellitus ([place]) 09/17/2019  . Acute CVA (cerebrovascular accident) ([place]) 08/11/2019  . DM (diabetes mellitus) ([place]) 08/11/2019  . Benign hypertension with chronic kidney disease, stage III 08/11/2019  . Tobacco abuse 08/11/2019  . CKD stage 4 due to type 2 diabetes mellitus ([place]) 08/11/2019    Palliative Care Assessment & Plan   Patient Profile: Phillip Camacho is a 49 y.o. 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  Patient underwent left knee popliteal bypass.  He subsequently required initiation of hemodialysis for ESRD.  Patient subsequently suffered additional strokes as seen on MRI.  He is been persistently encephalopathic and treated on antibiotics in the setting of presumed infection.  He has pulled his NGT making feeding difficult.  Palliative care was consulted to help address goals with family.  Assessment: Critical left leg ischemia s/p left femoral knee popliteal bypass Progressive CKD stage IV now ESRD receiving dialysis Multiple acute embolic  strokes Acute encephalopathy Sepsis with fever Leukocytosis Hx of CVA's  Recommendations/Plan:  After  [place] discussion 01/11/20, family decision against prolonged heroic measures and to shift towards comfort measures with transfer to hospice facility. However, family does not want patient to transfer to hospice home until patient's daughter Phillip Camacho) arrives from [place]. Phillip Camacho hopes to get here by 3/23.   Will begin shift to comfort measures as multiple oral medications that patient cannot take. Do not replace cortrak as shifting to comfort. Will continue IVF/ABX for now.  No further dialysis.  Symptom management:  Dilaudid 0.5mg  IV q3h prn pain/dyspnea/air hunger/tachypnea  Ativan 0.5mg  IV q6h prn anxiety  Robinul 0.2mg  IV q4h prn secretions  Unrestricted visitor access  Spiritual care consult. Daughter requesting [employer] priest.    Hospice facility referral when family ready.   Code Status:   DNR   Code Status Orders  (From admission, onward)         Start     Ordered   01/11/20 1706  Do not attempt resuscitation (DNR)  Continuous    Question Answer Comment  In the event of cardiac or respiratory ARREST Do not call a "code blue"   In the event of cardiac or respiratory ARREST Do not perform Intubation, CPR, defibrillation or ACLS   In the event of cardiac or respiratory ARREST Use medication by any route, position, wound care, and other measures to relive pain and suffering. May use oxygen, suction and manual treatment of airway obstruction as needed for comfort.      01/11/20 1705        Code Status History    Date Active Date Inactive Code Status Order ID Comments User Context   01/01/2020 1224 01/11/2020 1705 Full Code [id number]  Ortencia Kick Inpatient   12/31/2019 2154 01/01/2020 1224 Full Code [id number]  Vianne Bulls, MD Inpatient   11/14/2019 1656 11/18/2019 2047 Full Code [id number]  Damita Lack, MD ED   09/30/2019 1126 10/02/2019 2131 Full  Code [id number]  [name] Roan, MD ED   08/11/2019 2040 08/14/2019 1737 Full Code [id number]  Alma Friendly, MD ED   Advance Care Planning Activity       Prognosis:   Poor prognosis  Discharge Planning:  Hospice facility   Care plan was discussed with RN, daughter Phillip Camacho)  Thank you for allowing the Palliative Medicine Team to assist in the care of this patient.   Time In: 1000- 1430- Time Out: [phone] Total Time 35 Prolonged Time Billed  no      Greater than 50%  of this time was spent counseling and coordinating care related to the above assessment and plan.  Ihor Dow, DNP FNP-C Palliative Medicine Team  Phone: [phone] Fax: [phone]  Please contact Palliative Medicine Team phone at [phone] for questions and concerns.

## 2020-01-13 NOTE — Progress Notes (Signed)
Pharmacy Antibiotic Note  Phillip Camacho is a 49 y.o. male admitted on 12/31/2019 with ischemic foot. Pharmacy has been consulted for Vancomycin / Cefepime dosing on 3/17 for fever, r/o sepsis.  Cultures have been negative.  Patient transitioning to comfort care and HD is being stopped.   Plan: Vancomycin given 3/19 - will hold further doses and it is unsafe to continue w/o HD to remove and unreasonable to check multiple levels Decrease Cefepime to 500mg  iv Q 24 hours - would limit duration due to potential adverse effects d/t building up  Height: 5\' 5"  (165.1 cm) Weight: 152 lb (68.9 kg)(took equipment off end of bed) IBW/kg (Calculated) : 61.5  Temp (24hrs), Avg:97.7 F (36.5 C), Min:97.6 F (36.4 C), Max:97.8 F (36.6 C)  Recent Labs  Lab 01/07/20 0412 01/07/20 1603 01/07/20 1855 01/08/20 0228 01/09/20 0239 01/10/20 0452 01/11/20 0242 01/12/20 0315  WBC   < >  --   --  33.5* 35.2* 32.6* 25.1* 21.4*  CREATININE   < >  --   --  4.80* 6.36* 4.45* 5.77* 7.07*  LATICACIDVEN  --  1.1 0.9  --   --   --   --   --    < > = values in this interval not displayed.    Estimated Creatinine Clearance: 11.1 mL/min (A) (by C-G formula based on SCr of 7.07 mg/dL (H)).    No Known Allergies  Vancomycin 3/17> Cefepime 3/17>  3/16 blood x2- ng 3/17 blood x2- ng  Erin Hearing PharmD., BCPS Clinical Pharmacist 01/13/2020 11:10 AM

## 2020-01-14 DIAGNOSIS — M79605 Pain in left leg: Secondary | ICD-10-CM

## 2020-01-14 DIAGNOSIS — I998 Other disorder of circulatory system: Secondary | ICD-10-CM

## 2020-01-14 NOTE — Discharge Summary (Signed)
Physician Discharge Summary  Phillip Camacho OMB:[id number] DOB: 22-Dec-1970 DOA: 12/31/2019  PCP: [name], MD  Admit date: 12/31/2019 Discharge date: 01/14/2020  Admitted From: Home Disposition:  Home  Discharge Condition:Guarded CODE STATUS: Comfort Care Diet recommendation: Regular   Brief/Interim Summary: Patient is a 49 year old male with history of insulin-dependent diabetes mellitus hypertension, CKD stage IV, recurrent CVA with left-sided residual hemiparesis who was brought to the emergency department with complaints of left leg pain for 4 weeks.  He was last hospitalized in January 2021 and for management of stroke.  Since that time, he has been confused, not eating or drinking, needing supervision with ADL s.  In the emergency department, he was found to be hyperkalemic, has severe AKI with creatinine of 5.6, leukocytosis.  He was found to have critical left lower extremity ischemia.  Admitted for the management of limb ischemia, hyperkalemia, renal failure.  Vascular surgery was consulted and he underwent left femoral to below-knee popliteal bypass on 3/11.  Nephrology was also following and started on dialysis after placing temporary dialysis catheter.  Due to persistent encephalopathy, MRI was done which showed bilateral multiple strokes. Given his multiple comorbidities, palliative care was consulted.  Patient is now  DNR.  Initiated comfort care.  Plan is to transfer him to residential hospice.  Following problems were addressed during his hospitalization:  Progressively  CKD stage 4 now ESRD: Secondary to poorly controlled diabetes, hypertension.  Nephrology following.  Underwent tunneled dialysis catheter placement and initiation of dialysis on 3/12.  AV fistula placed on 3/15.  He has been transitioned to comfort care, dialysis stopped.  Critical limb ischemia of left lower extremity: Presented with left foot pain.  Status post left femoral to below-knee popliteal bypass with  reverse saphenous  vein on 3/11. He was started  on aspirin, Brilinta, Lipitor.  Left leg is erythematous and left 3-5 toes are blackish and getting demarcated.  No evidence of DVT.  Vascular surgery was following.  Sepsis with fever: Hospital course remarkable for intermittent temperatures spike.  Chest x-ray on 3/17 did not show any pneumonia.  Blood cultures have not shown any growth.  On empiric vancomycin and cefepime which was started on 3/17.  Has severe leukocytosis.  Acute encephalopathy: Most likely multifactorial associated with bilateral multiple strokes as per MRI, sepsis, uremia.  Continues to remain lethargic.  He was initially on tube feed but he pulled it himself, no plan to put another one.  Multiple acute embolic strokes: Seen by MRI on 3/17.  He has history of strokes in the past.  EEG showed diffuse encephalopathy.  LDL of 28.  Hemoglobin A1c of 7.3.  Severe hyperkalemia: Resolved  Insulin-dependent diabetes type 2: Hemoglobin A1c 7.3 as per 3/17.    Hypertension: Currently blood pressure stable  Hyperlipidemia: He was on statin statin  Smoker: He was smoking before admission.  Normocytic anemia: Most likely associated with CKD.  Currently  hemoglobin stable.No plan to check blood work .     Discharge Diagnoses:  Principal Problem:   Hyperkalemia Active Problems:   Acute renal failure superimposed on stage 4 chronic kidney disease ([place])   Insulin-requiring or dependent type II diabetes mellitus ([name])   Hypertension   Pressure injury of skin   Palliative care by specialist   Goals of care, counseling/discussion   Protein-calorie malnutrition, severe   Palliative care encounter   ESRD (end stage renal disease) [facility])    Discharge Instructions  Discharge Instructions    Diet general  Complete by: As directed      Allergies as of 01/14/2020   No Known Allergies     Medication List    STOP taking these medications   acetaminophen 650 MG CR  tablet Commonly known as: TYLENOL   amLODipine 10 MG tablet Commonly known as: NORVASC   aspirin 81 MG EC tablet   atorvastatin 80 MG tablet Commonly known as: LIPITOR   blood glucose meter kit and supplies Kit   erythromycin ophthalmic ointment   ezetimibe 10 MG tablet Commonly known as: ZETIA   insulin aspart 100 UNIT/ML FlexPen Commonly known as: NOVOLOG   insulin glargine 100 UNIT/ML Solostar Pen Commonly known as: LANTUS   loratadine 10 MG tablet Commonly known as: CLARITIN   metoprolol tartrate 25 MG tablet Commonly known as: LOPRESSOR   nicotine 21 mg/24hr patch Commonly known as: NICODERM CQ - dosed in mg/24 hours   ticagrelor 90 MG Tabs tablet Commonly known as: BRILINTA      Follow-up Information    Early, Arvilla Meres, MD Follow up today.   Specialties: Vascular Surgery, Cardiology Why: 2-3 week follow up for incision check. Office will contact the patient to make appointment Contact information: [place] [address] [phone]          No Known Allergies  Consultations:  Vascular surgery, palliative care, nephrology   Procedures/Studies: MR BRAIN WO CONTRAST  Result Date: 01/07/2020 CLINICAL DATA:  Left-sided weakness, code stroke follow-up EXAM: MRI HEAD WITHOUT CONTRAST TECHNIQUE: Multiplanar, multiecho pulse sequences of the brain and surrounding structures were obtained without intravenous contrast. COMPARISON:  11/14/2019 FINDINGS: Brain: There are numerous small foci of restricted diffusion involving multiple vascular territories bilaterally. Multiple chronic small infarcts are identified including involvement of the central white matter, basal ganglia, thalamus, and cerebellum. There is wallerian degeneration along the right cerebral peduncle. Foci of susceptibility likely reflecting chronic blood products are again identified in association with several of these chronic infarcts. There is no intracranial mass, mass effect, or  edema. Ventricles are stable in size. There is no hydrocephalus or extra-axial fluid collection. Vascular: Major vessel flow voids at the skull base are preserved. Skull and upper cervical spine: Normal marrow signal is preserved. Sinuses/Orbits: Paranasal sinuses are aerated. Orbits are unremarkable. Other: Sella is unremarkable. Mastoid air cells are clear. Right posterior scalp lipoma. IMPRESSION: Numerous small acute infarctions involving bilateral anterior and posterior circulations. Multiple chronic infarcts. Electronically Signed   By: Macy Mis M.D.   On: 01/07/2020 15:13   US Abdomen Complete  Result Date: 01/10/2020 CLINICAL DATA:  Acute renal injury. Groin lymphadenopathy. Ordering physician concern for abdominal malignancy. EXAM: ABDOMEN ULTRASOUND COMPLETE COMPARISON:  None. FINDINGS: Gallbladder: The gallbladder wall measures 4.8 mm. No stones. Sludge is identified. No Murphy's sign identified. There is pericholecystic fluid. Common bile duct: Diameter: 3.7 mm Liver: No focal lesion identified. Within normal limits in parenchymal echogenicity. Portal vein is patent on color Doppler imaging with normal direction of blood flow towards the liver. IVC: No abnormality visualized. Pancreas: Not visualized. Spleen: Size and appearance within normal limits. Right Kidney: Length: 9.9 cm. Increased echogenicity. No focal mass or hydronephrosis. Left Kidney: Length: 11.7 cm. Increased echogenicity. No focal mass or hydronephrosis. Abdominal aorta: The mid and distal abdominal aorta was not well visualized due to shadowing bowel gas. No aneurysm identified. Other findings: None. IMPRESSION: 1. There is gallbladder wall thickening with sludge and pericholecystic fluid. No stones or Murphy's sign. If there is concern for acute cholecystitis,  recommend HIDA scan. 2. The pancreas was not visualized due to shadowing bowel gas. 3. Medical renal disease. 4. No other abnormalities are identified. Electronically  Signed   By: Dorise Bullion III M.D   On: 01/10/2020 10:56   DG CHEST PORT 1 VIEW  Result Date: 01/07/2020 CLINICAL DATA:  49 year old male with history of fever. Hemodialysis patient. EXAM: PORTABLE CHEST 1 VIEW COMPARISON:  Chest x-ray 01/02/2020. FINDINGS: Right internal jugular PermCath with tip terminating at the superior cavoatrial junction. Lung volumes are normal. Linear scarring in the left lower lung. No consolidative airspace disease. No pleural effusions. No pneumothorax. No pulmonary nodule or mass noted. Pulmonary vasculature and the cardiomediastinal silhouette are within normal limits. Electronic device projecting over the left side of the heart, presumably an implantable loop recorder. IMPRESSION: 1. Low lung volumes without radiographic evidence of acute cardiopulmonary disease. Electronically Signed   By: Vinnie Langton M.D.   On: 01/07/2020 08:50   DG CHEST PORT 1 VIEW  Result Date: 01/02/2020 CLINICAL DATA:  Renal failure; post-op dialysis catheter placement. Hx of diabetes, stroke, loop recorder insertion(07/2019), TEE w/o cardioversion(07/2019). Smoker. EXAM: PORTABLE CHEST 1 VIEW COMPARISON:  11/14/2019 FINDINGS: Right dialysis catheter tip: Right atrium. Loop recorder noted. Low lung volumes are present, causing crowding of the pulmonary vasculature. Borderline enlargement of the cardiopericardial silhouette. The lungs appear clear. No blunting of the costophrenic angles. IMPRESSION: 1. Right dialysis catheter tip: Right atrium.  No pneumothorax. 2. Borderline enlargement of the cardiopericardial silhouette. Electronically Signed   By: Van Clines M.D.   On: 01/02/2020 11:54   EEG adult  Result Date: 01/08/2020 Lora Havens, MD     01/08/2020 12:15 PM Patient Name: Phillip Camacho MRN: [id number] Epilepsy Attending: Lora Havens Referring Physician/Provider: Dr. Amie [place] Date: 01/07/2020 Duration: 22.52 minutes Patient history: 49 year old male with renal  failure on hemodialysis with worsening encephalopathy.  EEG to evaluate for seizures. Level of alertness: Lethargic AEDs during EEG study: None Technical aspects: This EEG study was done with scalp electrodes positioned according to the 10-20 International system of electrode placement. Electrical activity was acquired at a sampling rate of _0  and reviewed with a high frequency filter of _1  and a low frequency filter of _2 . EEG data were recorded continuously and digitally stored. Description: No clear posterior dominant was seen.  EEG showed continuous generalized polymorphic 3 to 6 Hz theta-delta slowing.  Hyperventilation photic summation were not performed. Abnormality - Continuous slow, generalized. IMPRESSION: This study is suggestive of moderate to severe diffuse encephalopathy, nonspecific etiology. No seizures or definite epileptiform discharges were seen throughout the recording. Priyanka Barbra Sarks   DG Fluoro Guide CV Line-No Report  Result Date: 01/02/2020 Fluoroscopy was utilized by the requesting physician.  No radiographic interpretation.   VAS [place] ABI WITH/WO TBI  Result Date: 01/03/2020 LOWER EXTREMITY DOPPLER STUDY Indications: Rest pain. High Risk Factors: Hypertension, Diabetes, prior CVA. Other Factors: CKD,.  Vascular Interventions: S/P Left leg femoral to below knee popliteal bypass. Comparison Study: No prior Performing Technologist: Sharion Dove RVS  Examination Guidelines: A complete evaluation includes at minimum, Doppler waveform signals and systolic blood pressure reading at the level of bilateral brachial, anterior tibial, and posterior tibial arteries, when vessel segments are accessible. Bilateral testing is considered an integral part of a complete examination. Photoelectric Plethysmograph (PPG) waveforms and toe systolic pressure readings are included as required and additional duplex testing as needed. Limited examinations for reoccurring indications may be performed  as noted.  ABI  Findings: +---------+------------------+-----+---------+--------+ Right    Rt Pressure (mmHg)IndexWaveform Comment  +---------+------------------+-----+---------+--------+ Brachial 179                    triphasic         +---------+------------------+-----+---------+--------+ PTA      117               0.65                   +---------+------------------+-----+---------+--------+ DP       109               0.60                   +---------+------------------+-----+---------+--------+ Great Toe31                0.17                   +---------+------------------+-----+---------+--------+ +---------+------------------+-----+---------+-------+ Left     Lt Pressure (mmHg)IndexWaveform Comment +---------+------------------+-----+---------+-------+ Brachial 181                    triphasic        +---------+------------------+-----+---------+-------+ PTA      74                0.41                  +---------+------------------+-----+---------+-------+ DP       81                0.45                  +---------+------------------+-----+---------+-------+ Great Toe22                0.12                  +---------+------------------+-----+---------+-------+ +-------+-----------+-----------+------------+------------+ ABI/TBIToday's ABIToday's TBIPrevious ABIPrevious TBI +-------+-----------+-----------+------------+------------+ Right  0.65       0.17                                +-------+-----------+-----------+------------+------------+ Left   0.45       0.12                                +-------+-----------+-----------+------------+------------+  Summary: Right: Resting right ankle-brachial index indicates moderate right lower extremity arterial disease. The right toe-brachial index is abnormal. Left: Resting left ankle-brachial index indicates severe left lower extremity arterial disease. The left toe-brachial index is  abnormal.  *See table(s) above for measurements and observations.  Electronically signed by Harold Barban MD on 01/03/2020 at 7:05:55 PM.    Final    CUP PACEART REMOTE DEVICE CHECK  Result Date: 12/21/2019 [facility] summary report received. Battery status OK. Normal device function. No new symptom episodes, tachy episodes, brady, or pause episodes. No new AF episodes. Monthly summary reports and ROV/PRN. SChancey  ECHOCARDIOGRAM COMPLETE BUBBLE STUDY  Result Date: 01/10/2020    ECHOCARDIOGRAM REPORT   Patient Name:   HATIM HOMANN Date of Exam: 01/10/2020 Medical Rec #:  [id number]     Height:       65.0 in Accession #:    [id number]    Weight:       116.4 lb Date of Birth:  1971-03-29     BSA:  1.572 m Patient Age:    44 years      BP:           128/61 mmHg Patient Gender: M             HR:           90 bpm. Exam Location:  Inpatient Procedure: 2D Echo, Cardiac Doppler and Color Doppler Indications:    Stroke 434.91/I63.9  History:        Patient has no prior history of Echocardiogram examinations,                 most recent 11/15/2019. Risk Factors:Hypertension, Diabetes,                 Current Smoker and Dyslipidemia. Acute renal failure. CKD. CVA.  Sonographer:    Clayton Lefort RDCS (AE) Referring Phys: [id number] Melvenia Beam  Sonographer Comments: Suboptimal parasternal window. Image acquisition challenging due to uncooperative patient. Patient involuntairly moving during test, repeatedly pushing sonographer's had away from chest. Test stopped after multiple attempts to acquire images. IMPRESSIONS  1. Limited images.  2. Left ventricular ejection fraction, by estimation, is 55%. The left ventricle has normal function. Left ventricular endocardial border not optimally defined to evaluate regional wall motion. There is mild left ventricular hypertrophy. Left ventricular diastolic function could not be evaluated.  3. Right ventricular systolic function was not well visualized. The right ventricular  size is normal.  4. Small to moderate pericardial effusion. The pericardial effusion is posterior and lateral to the left ventricle.  5. The mitral valve is grossly normal. No evidence of mitral valve regurgitation.  6. The aortic valve is tricuspid. Aortic valve regurgitation is trivial. FINDINGS  Left Ventricle: Left ventricular ejection fraction, by estimation, is 55%. The left ventricle has normal function. Left ventricular endocardial border not optimally defined to evaluate regional wall motion. The left ventricular internal cavity size was normal in size. There is mild left ventricular hypertrophy. Left ventricular diastolic function could not be evaluated. Right Ventricle: The right ventricular size is normal. Right vetricular wall thickness was not assessed. Right ventricular systolic function was not well visualized. Left Atrium: Left atrial size was normal in size. Right Atrium: Right atrial size was normal in size. Pericardium: A moderately sized pericardial effusion is present. The pericardial effusion is posterior and lateral to the left ventricle. Mitral Valve: The mitral valve is grossly normal. No evidence of mitral valve regurgitation. Tricuspid Valve: The tricuspid valve is grossly normal. Tricuspid valve regurgitation not assessed. Aortic Valve: The aortic valve is tricuspid. Aortic valve regurgitation is trivial. Pulmonic Valve: The pulmonic valve was not well visualized. Pulmonic valve regurgitation not assessed. Aorta: The aortic root is normal in size and structure. Venous: The inferior vena cava was not well visualized. IAS/Shunts: The interatrial septum was not assessed.  LEFT VENTRICLE PLAX 2D LVIDd:         3.60 cm LVIDs:         2.80 cm LV PW:         1.20 cm LV IVS:        1.20 cm LVOT diam:     2.00 cm LVOT Area:     3.14 cm  LEFT ATRIUM         Index LA diam:    2.30 cm 1.46 cm/m   AORTA Ao Root diam: 2.60 cm  SHUNTS Systemic Diam: 2.00 cm Rozann Lesches MD Electronically signed by  Rozann Lesches MD Signature Date/Time: 01/10/2020/3:05:55  PM    Final    CT HEAD CODE STROKE WO CONTRAST  Result Date: 01/06/2020 CLINICAL DATA:  Code stroke. Acute onset of mental status change. Left-sided weakness and left facial droop. EXAM: CT HEAD WITHOUT CONTRAST TECHNIQUE: Contiguous axial images were obtained from the base of the skull through the vertex without intravenous contrast. COMPARISON:  MR head 11/14/2019 FINDINGS: Brain: An area of faint hypoattenuation is present in the subcortical white matter of the right frontal operculum. There is possible involvement of the cortex. No other focal cortical lesions are present. Remote lacunar infarcts of the basal ganglia and thalami are stable bilaterally. White matter changes of the brainstem are stable. No other acute cortical infarct is present. The ventricles are of normal size. No significant extraaxial fluid collection is present. A remote lacunar infarct of the right cerebellum is again noted. Vascular: Atherosclerotic changes are present within the cavernous internal carotid arteries bilaterally. No hyperdense vessel is present. Skull: Calvarium is intact. No focal lytic or blastic lesions are present. No significant extracranial soft tissue lesion is present. Sinuses/Orbits: The paranasal sinuses and mastoid air cells are clear. The globes and orbits are within normal limits. ASPECTS ([place] Stroke Program Early CT Score) - Ganglionic level infarction (caudate, lentiform nuclei, internal capsule, insula, M1-M3 cortex): 7/7 - Supraganglionic infarction (M4-M6 cortex): 2/3 Total score (0-10 with 10 being normal): 9/10 IMPRESSION: 1. New area of hypoattenuation in the subcortical white matter with probable extension to the cortex in the right frontal operculum. 2. No other acute cortical infarct. 3. Remote lacunar infarcts of the basal ganglia, thalami, and right cerebellum. 4. ASPECTS is 9/10 The above was relayed via text pager to Dr. Leonel Ramsay  on 01/06/2020 at 07:29 . Electronically Signed   By: San Morelle M.D.   On: 01/06/2020 07:29   VAS [place] LOWER EXTREMITY VENOUS (DVT)  Result Date: 01/07/2020  Lower Venous DVTStudy Indications: Edema.  Limitations: Open wound and poor ultrasound/tissue interface. Comparison Study: LEV 08-12-19, negative. Performing Technologist: Baldwin Crown ARDMS, RVT  Examination Guidelines: A complete evaluation includes B-mode imaging, spectral Doppler, color Doppler, and power Doppler as needed of all accessible portions of each vessel. Bilateral testing is considered an integral part of a complete examination. Limited examinations for reoccurring indications may be performed as noted. The reflux portion of the exam is performed with the patient in reverse Trendelenburg.  +-----+---------------+---------+-----------+----------+--------------+ RIGHTCompressibilityPhasicitySpontaneityPropertiesThrombus Aging +-----+---------------+---------+-----------+----------+--------------+ CFV  Full           Yes      Yes                                 +-----+---------------+---------+-----------+----------+--------------+   +---------+---------------+---------+-----------+----------+-------------------+ LEFT     CompressibilityPhasicitySpontaneityPropertiesThrombus Aging      +---------+---------------+---------+-----------+----------+-------------------+ CFV      Full           No       Yes                                      +---------+---------------+---------+-----------+----------+-------------------+ SFJ                                                   [place] for bypass? +---------+---------------+---------+-----------+----------+-------------------+ FV  Prox  Full                                                             +---------+---------------+---------+-----------+----------+-------------------+ FV Mid   Full                                                              +---------+---------------+---------+-----------+----------+-------------------+ FV DistalFull                                                             +---------+---------------+---------+-----------+----------+-------------------+ PFV      Full                                                             +---------+---------------+---------+-----------+----------+-------------------+ POP      Full           No       Yes                                      +---------+---------------+---------+-----------+----------+-------------------+ PTV      Full                                         poorly visualized   +---------+---------------+---------+-----------+----------+-------------------+ PERO     Full                                         poorly visualized   +---------+---------------+---------+-----------+----------+-------------------+ Multiple hypoechoic, oval structures with echogenic centers seen in left groin, likely lymph nodes. Largest measuring 2.4 x 1.1 x 1.4 cm. Anechoic structure with no blood flow seen in left groin, hematoma vs seroma, measures 3.4 x 1.3 x 2.2 cm.   Summary: RIGHT: - No evidence of common femoral vein obstruction.  LEFT: - There is no evidence of deep vein thrombosis in the lower extremity. All veins examined are compressible, however there appears to be continuous flow in CFV and popliteal vein. Portions of this examination were limited- see technologist comments above.  - No cystic structure found in the popliteal fossa.  - Multiple hypoechoic, oval structures with echogenic centers seen in left groin, likely lymph nodes. Largest measuring 2.4 x 1.1 x 1.4 cm. Anechoic structure with no blood flow seen in left groin, hematoma vs seroma, measures 3.4 x 1.3 x 2.2 cm.  *See table(s) above for measurements and observations. Electronically signed by Curt Jews MD on 01/07/2020 at 3:48:56 PM.  Final    VAS [place] UPPER EXT VEIN MAPPING (PRE-OP  AVF)  Result Date: 01/03/2020 UPPER EXTREMITY VEIN MAPPING  Indications: Pre-dialysis access. Limitations: bandages/IVs bilaterally Comparison Study: No prior Performing Technologist: Sharion Dove RVS  Examination Guidelines: A complete evaluation includes B-mode imaging, spectral Doppler, color Doppler, and power Doppler as needed of all accessible portions of each vessel. Bilateral testing is considered an integral part of a complete examination. Limited examinations for reoccurring indications may be performed as noted. +-----------------+-------------+----------+--------------+ Right Cephalic   Diameter (cm)Depth (cm)   Findings    +-----------------+-------------+----------+--------------+ Prox upper arm                          not visualized +-----------------+-------------+----------+--------------+ Mid upper arm                           not visualized +-----------------+-------------+----------+--------------+ Dist upper arm                          not visualized +-----------------+-------------+----------+--------------+ Antecubital fossa    0.27        0.27                  +-----------------+-------------+----------+--------------+ Prox forearm         0.25        0.29                  +-----------------+-------------+----------+--------------+ Mid forearm                             not visualized +-----------------+-------------+----------+--------------+ Wrist                                   not visualized +-----------------+-------------+----------+--------------+ +-----------------+-------------+----------+--------------+ Right Basilic    Diameter (cm)Depth (cm)   Findings    +-----------------+-------------+----------+--------------+ Prox upper arm       0.55        0.52                  +-----------------+-------------+----------+--------------+ Mid upper arm        0.48        0.50                   +-----------------+-------------+----------+--------------+ Dist upper arm       0.51        0.37                  +-----------------+-------------+----------+--------------+ Antecubital fossa    0.55        0.37                  +-----------------+-------------+----------+--------------+ Prox forearm         0.34        0.32     branching    +-----------------+-------------+----------+--------------+ Mid forearm          0.29        0.19                  +-----------------+-------------+----------+--------------+ Wrist                                   not visualized +-----------------+-------------+----------+--------------+ +-----------------+-------------+----------+--------------+ Left Cephalic  Diameter (cm)Depth (cm)   Findings    +-----------------+-------------+----------+--------------+ Prox upper arm       0.17        0.20                  +-----------------+-------------+----------+--------------+ Mid upper arm        0.19        0.26                  +-----------------+-------------+----------+--------------+ Dist upper arm       0.25        0.24                  +-----------------+-------------+----------+--------------+ Antecubital fossa    0.27        0.27                  +-----------------+-------------+----------+--------------+ Prox forearm         0.25        0.29     branching    +-----------------+-------------+----------+--------------+ Mid forearm          0.20        0.25                  +-----------------+-------------+----------+--------------+ Wrist                                   not visualized +-----------------+-------------+----------+--------------+ +-----------------+-------------+----------+---------+ Left Basilic     Diameter (cm)Depth (cm)Findings  +-----------------+-------------+----------+---------+ Prox upper arm       0.34        0.87   branching  +-----------------+-------------+----------+---------+ Mid upper arm        0.28        0.62             +-----------------+-------------+----------+---------+ Dist upper arm       0.38        0.43             +-----------------+-------------+----------+---------+ Antecubital fossa    0.40        0.48             +-----------------+-------------+----------+---------+ Prox forearm         0.18        0.36   branching +-----------------+-------------+----------+---------+ Mid forearm          0.11        0.17             +-----------------+-------------+----------+---------+ Wrist                0.11        0.19   branching +-----------------+-------------+----------+---------+ *See table(s) above for measurements and observations.  Diagnosing physician: Harold Barban MD Electronically signed by Harold Barban MD on 01/03/2020 at 61:06:22 PM.    Final        Subjective: Patient seen and examined at the bedside this morning.  Remains very weak, lethargic.  No significant changes from yesterday  Discharge Exam: Vitals:   01/12/20 2000 01/13/20 0936  BP: (!) 147/74 (!) 153/57  Pulse:  86  Resp: 15 16  Temp: 97.8 F (36.6 C) 97.6 F (36.4 C)  SpO2: 93%    Vitals:   01/12/20 1200 01/12/20 2000 01/13/20 0500 01/13/20 0936  BP: (!) 151/71 (!) 147/74  (!) 153/57  Pulse:    86  Resp: _0 Temp:  97.8 F (36.6 C)  97.6 F (36.4 C)  TempSrc:  Axillary  Axillary  SpO2:  93%    Weight:   68.9 kg   Height:        General: Pt is not alert or awake, not in acute distress Cardiovascular: RRR, S1/S2 +, no rubs, no gallops Respiratory: CTA bilaterally, no wheezing, no rhonchi Abdominal: Soft, NT, ND, bowel sounds + Extremities: no edema, no cyanosis    The results of significant diagnostics from this hospitalization (including imaging, microbiology, ancillary and laboratory) are listed below for reference.     Microbiology: Recent Results (from the past 240  hour(s))  Culture, blood (routine x 2)     Status: None   Collection Time: 01/06/20 10:55 AM   Specimen: BLOOD  Result Value Ref Range Status   Specimen Description BLOOD RIGHT ANTECUBITAL  Final   Special Requests   Final    AEROBIC BOTTLE ONLY Blood Culture results may not be optimal due to an inadequate volume of blood received in culture bottles   Culture   Final    NO GROWTH 5 DAYS Performed at [facility], [place] [address], [place], [place] [address]    Report Status 01/11/2020 FINAL  Final  Culture, blood (routine x 2)     Status: None   Collection Time: 01/06/20 12:17 PM   Specimen: BLOOD  Result Value Ref Range Status   Specimen Description BLOOD RIGHT ANTECUBITAL  Final   Special Requests   Final    AEROBIC BOTTLE ONLY Blood Culture results may not be optimal due to an inadequate volume of blood received in culture bottles   Culture   Final    NO GROWTH 5 DAYS Performed at [facility], [place] [address], [place], [place] [address]    Report Status 01/11/2020 FINAL  Final  Culture, blood (routine x 2)     Status: None   Collection Time: 01/07/20 11:45 AM   Specimen: BLOOD  Result Value Ref Range Status   Specimen Description BLOOD HEMODIALYSIS CATHETER VENOUS  Final   Special Requests   Final    BOTTLES DRAWN AEROBIC ONLY Blood Culture adequate volume   Culture   Final    NO GROWTH 5 DAYS Performed at [facility], [place] [address], [place], [place] [address]    Report Status 01/12/2020 FINAL  Final  Culture, blood (routine x 2)     Status: None   Collection Time: 01/07/20  4:11 PM   Specimen: BLOOD  Result Value Ref Range Status   Specimen Description BLOOD HEMODIALYSIS CATHETER ARTERIAL  Final   Special Requests   Final    BOTTLES DRAWN AEROBIC AND ANAEROBIC Blood Culture adequate volume   Culture   Final    NO GROWTH 5 DAYS Performed at [facility], [place] [address] [place], [place] [address]    Report Status 01/12/2020 FINAL  Final   SARS CORONAVIRUS 2 (TAT 6-24 HRS) Nasopharyngeal Nasopharyngeal Swab     Status: None   Collection Time: 01/07/20  4:49 PM   Specimen: Nasopharyngeal Swab  Result Value Ref Range Status   SARS Coronavirus 2 NEGATIVE NEGATIVE Final    Comment: (NOTE) SARS-CoV-2 target nucleic acids are NOT DETECTED. The SARS-CoV-2 RNA is generally detectable in upper and lower respiratory specimens during the acute phase of infection. Negative results do not preclude SARS-CoV-2 infection, do not rule out co-infections with other pathogens, and should not be used as the sole basis for treatment or  other patient management decisions. Negative results must be combined with clinical observations, patient history, and epidemiological information. The expected result is Negative. Fact Sheet for Patients: SugarRoll.be Fact Sheet for Healthcare Providers: https://www.woods-mathews.com/ This test is not yet approved or cleared by the [place] FDA and  has been authorized for detection and/or diagnosis of SARS-CoV-2 by FDA under an Emergency Use Authorization (EUA). This EUA will remain  in effect (meaning this test can be used) for the duration of the COVID-19 declaration under Section 56 4(b)(1) of the Act, 21 U.S.C. section 360bbb-3(b)(1), unless the authorization is terminated or revoked sooner. Performed at [facility], [place] [address] [place], [place] [address]      Labs: BNP (last 3 results) Recent Labs    08/11/19 1050  BNP 213.0*   Basic Metabolic Panel: Recent Labs  Lab 01/08/20 0228 01/08/20 0228 01/09/20 0239 01/09/20 0239 01/09/20 1555 01/09/20 1555 01/09/20 1851 01/10/20 0452 01/10/20 1610 01/11/20 0242 01/12/20 0315  NA 135  --  138  --   --   --   --  138  --  142 143  K 4.5  --  4.3  --   --   --   --  3.3*  --  3.5 3.6  CL 97*  --  99  --   --   --   --  96*  --  103 108  CO2 19*  --  19*  --   --   --   --  21*  --   23 18*  GLUCOSE 216*  --  163*  --   --   --   --  256*  --  140* 126*  BUN 35*  --  58*  --   --   --   --  39*  --  52* 65*  CREATININE 4.80*  --  6.36*  --   --   --   --  4.45*  --  5.77* 7.07*  CALCIUM 7.7*  --  8.2*  --   --   --   --  7.8*  --  8.2* 8.2*  MG  --   --   --   --  2.2  --  2.1 2.3 2.5*  --   --   PHOS 5.7*   < > 5.1*   < > 2.7   < > 3.2 4.3 4.1 4.1 5.4*   < > = values in this interval not displayed.   Liver Function Tests: Recent Labs  Lab 01/08/20 0228 01/09/20 0239 01/10/20 0452 01/11/20 0242 01/12/20 0315  ALBUMIN 1.4* 1.4* 1.4* 1.4* 1.4*   No results for input(s): LIPASE, AMYLASE in the last 168 hours. No results for input(s): AMMONIA in the last 168 hours. CBC: Recent Labs  Lab 01/08/20 0228 01/09/20 0239 01/10/20 0452 01/11/20 0242 01/12/20 0315  WBC 33.5* 35.2* 32.6* 25.1* 21.4*  NEUTROABS  --   --   --  17.7* 17.8*  HGB 7.7* 8.0* 8.6* 7.8* 7.9*  HCT 24.9* 26.0* 28.0* 25.3* 27.4*  MCV 91.5 91.5 91.2 91.7 94.5  PLT 524* 600* 630* 633* 659*   Cardiac Enzymes: No results for input(s): CKTOTAL, CKMB, CKMBINDEX, TROPONINI in the last 168 hours. BNP: Invalid input(s): POCBNP CBG: Recent Labs  Lab 01/11/20 1635 01/11/20 1937 01/12/20 0030 01/12/20 0428 01/12/20 1137  GLUCAP 123* 105* 107* 120* 134*   D-Dimer No results for input(s): DDIMER in the last 72 hours. Hgb A1c No  results for input(s): HGBA1C in the last 72 hours. Lipid Profile No results for input(s): CHOL, HDL, LDLCALC, TRIG, CHOLHDL, LDLDIRECT in the last 72 hours. Thyroid function studies No results for input(s): TSH, T4TOTAL, T3FREE, THYROIDAB in the last 72 hours.  Invalid input(s): FREET3 Anemia work up No results for input(s): VITAMINB12, FOLATE, FERRITIN, TIBC, IRON, RETICCTPCT in the last 72 hours. Urinalysis    Component Value Date/Time   COLORURINE YELLOW 01/01/2020 1644   APPEARANCEUR CLEAR 01/01/2020 1644   LABSPEC 1.012 01/01/2020 1644   PHURINE 5.0  01/01/2020 1644   GLUCOSEU 50 (A) 01/01/2020 1644   HGBUR MODERATE (A) 01/01/2020 1644   BILIRUBINUR NEGATIVE 01/01/2020 1644   KETONESUR 20 (A) 01/01/2020 1644   PROTEINUR 100 (A) 01/01/2020 1644   NITRITE NEGATIVE 01/01/2020 1644   LEUKOCYTESUR NEGATIVE 01/01/2020 1644   Sepsis Labs Invalid input(s): PROCALCITONIN,  WBC,  LACTICIDVEN Microbiology Recent Results (from the past 240 hour(s))  Culture, blood (routine x 2)     Status: None   Collection Time: 01/06/20 10:55 AM   Specimen: BLOOD  Result Value Ref Range Status   Specimen Description BLOOD RIGHT ANTECUBITAL  Final   Special Requests   Final    AEROBIC BOTTLE ONLY Blood Culture results may not be optimal due to an inadequate volume of blood received in culture bottles   Culture   Final    NO GROWTH 5 DAYS Performed at [facility], [place] [address] [place], [place] [address]    Report Status 01/11/2020 FINAL  Final  Culture, blood (routine x 2)     Status: None   Collection Time: 01/06/20 12:17 PM   Specimen: BLOOD  Result Value Ref Range Status   Specimen Description BLOOD RIGHT ANTECUBITAL  Final   Special Requests   Final    AEROBIC BOTTLE ONLY Blood Culture results may not be optimal due to an inadequate volume of blood received in culture bottles   Culture   Final    NO GROWTH 5 DAYS Performed at [facility], [place] [address] [place], [place] [address]    Report Status 01/11/2020 FINAL  Final  Culture, blood (routine x 2)     Status: None   Collection Time: 01/07/20 11:45 AM   Specimen: BLOOD  Result Value Ref Range Status   Specimen Description BLOOD HEMODIALYSIS CATHETER VENOUS  Final   Special Requests   Final    BOTTLES DRAWN AEROBIC ONLY Blood Culture adequate volume   Culture   Final    NO GROWTH 5 DAYS Performed at [facility], [place] [address] [place], [place] [address]    Report Status 01/12/2020 FINAL  Final  Culture, blood (routine x 2)     Status: None   Collection Time:  01/07/20  4:11 PM   Specimen: BLOOD  Result Value Ref Range Status   Specimen Description BLOOD HEMODIALYSIS CATHETER ARTERIAL  Final   Special Requests   Final    BOTTLES DRAWN AEROBIC AND ANAEROBIC Blood Culture adequate volume   Culture   Final    NO GROWTH 5 DAYS Performed at [facility], [place] [address] [place], [place] [address]    Report Status 01/12/2020 FINAL  Final  SARS CORONAVIRUS 2 (TAT 6-24 HRS) Nasopharyngeal Nasopharyngeal Swab     Status: None   Collection Time: 01/07/20  4:49 PM   Specimen: Nasopharyngeal Swab  Result Value Ref Range Status   SARS Coronavirus 2 NEGATIVE NEGATIVE Final  Comment: (NOTE) SARS-CoV-2 target nucleic acids are NOT DETECTED. The SARS-CoV-2 RNA is generally detectable in upper and lower respiratory specimens during the acute phase of infection. Negative results do not preclude SARS-CoV-2 infection, do not rule out co-infections with other pathogens, and should not be used as the sole basis for treatment or other patient management decisions. Negative results must be combined with clinical observations, patient history, and epidemiological information. The expected result is Negative. Fact Sheet for Patients: SugarRoll.be Fact Sheet for Healthcare Providers: https://www.woods-mathews.com/ This test is not yet approved or cleared by the [place] FDA and  has been authorized for detection and/or diagnosis of SARS-CoV-2 by FDA under an Emergency Use Authorization (EUA). This EUA will remain  in effect (meaning this test can be used) for the duration of the COVID-19 declaration under Section 56 4(b)(1) of the Act, 21 U.S.C. section 360bbb-3(b)(1), unless the authorization is terminated or revoked sooner. Performed at [facility], [place] [address], [place], [place] [address]     Please note: You were cared for by a hospitalist during your hospital stay. Once you are discharged,  your primary care physician will handle any further medical issues. Please note that NO REFILLS for any discharge medications will be authorized once you are discharged, as it is imperative that you return to your primary care physician (or establish a relationship with a primary care physician if you do not have one) for your post hospital discharge needs so that they can reassess your need for medications and monitor your lab values.    Time coordinating discharge: 40 minutes  SIGNED:   Shelly Coss, MD  [facility] 01/14/2020, 12:56 PM Pager [phone]  If 7PM-7AM, please contact night-coverage [email or web address] Password [email or web address]

## 2020-01-14 NOTE — Progress Notes (Signed)
Notified by [employer]- family has completed needed paperwork and they are ready for patient to transport to [facility]. [facility] called for transport- paperwork along with GOLD DNR placed on shadow chart for [facility]. (per [facility] there are several patients ahead of pt.  and he has been placed on list for transport)  Bedside RN has been provided phone # to [facility] to call report- [phone]

## 2020-01-14 NOTE — Progress Notes (Signed)
Palliative Medicine RN Note: Rec'd a call from [place] on 4E regarding possible transfer of Phillip Canupp to residential hospice, as his daughter was supposed to arrive from [place] yesterday; no family at bedside, and attending is asking about discharging pt.   I was able to reach PMT NP Ihor Dow between patients. At this time, residential hospice referral is not in line with family goals. They do not agree with de-escalation to full comfort care until his daughter arrives. Phillip Camacho has left a message for Phillip Camacho's daughter this morning and is awaiting a call back to discuss her arrival, de-escalation of care, and hospice referral. Until family agrees with a comfort approach, PMT will not provide an order for residential hospice.  Phillip Skiff [name], RN, BSN, [facility] Palliative Medicine Team 01/14/2020 10:33 AM Office [phone]

## 2020-01-14 NOTE — TOC Transition Note (Signed)
Transition of Care [facility]) - CM/SW Discharge Note Marvetta Gibbons RN, BSN Transitions of Care Unit 4E- RN Case Manager [phone]   Patient Details  Name: Phillip Camacho MRN: [id number] Date of Birth: 07/09/1971  Transition of Care [facility]) CM/SW Contact:  Dawayne Patricia, RN Phone Number: 01/14/2020, 1:37 PM   Clinical Narrative:    PC has been following pt for [place], pt has been made DNR, and goal for comfort care. Plan was to await daughter arrival from [place] however she has not been able to get here yet and family/daughter has agreed to move forward with transition to [facility]. Referral has been received for [place]. Call made to Audrea Muscat with [facility] for [facility] referral- confirmed with Audrea Muscat that [facility] is allowing family to visit at this time- current policy is 2 visitors at a time and will be allowed to rotate out starting tomorrow. Per Audrea Muscat pt should have a bed today at [facility]- pending review by them and f/u with family. They will confirm this afternoon. GOLD DNR has been signed for transport and will await confirmation from [facility] on transfer there later today.    Final next level of care: [place] Barriers to Discharge: Barriers Resolved   Patient Goals and CMS Choice Patient states their goals for this hospitalization and ongoing recovery are:: comfort care and [facility] CMS Medicare.gov Compare Post Acute Care list provided to:: Patient Represenative (must comment)(Jimmy (Son)) Choice offered to / list presented to : Adult Children(Son Jimmy)  Discharge Placement                 [facility]      Discharge Plan and Services In-house Referral: Clinical Social Work Discharge Planning Services: CM Consult Post Acute Care Choice: Hospice          DME Arranged: N/A DME Agency: NA         HH Agency: NA        Social Determinants of Health (SDOH)  Interventions     Readmission Risk Interventions Readmission Risk Prevention Plan 01/14/2020  Transportation Screening Complete  Medication Review Press photographer) Complete  PCP or Specialist appointment within 3-5 days of discharge Not Complete  PCP/Specialist Appt Not Complete comments comfort care  [place] or [place] Complete  SW Recovery Care/Counseling Consult Complete  Palliative Care Screening Complete  [place] Not Applicable  Some recent data might be hidden

## 2020-01-14 NOTE — Progress Notes (Signed)
Manufacturing engineer [facility]) Hospital Liaison Note;  Received request from [place] for family interest in [facility]. Confirmed interest with family after explaining hospice philosophy and services. Family made aware of visitation policy during [place] and limited volunteer services.   [employer] paper work is complete and Plan is for patient to transfer this afternoon.   Please fax discharge summary to [phone] and have bedside RN call report to [phone].   Please arrange transport for patient to arrive as soon as possible.   CM/TOC aware of above.  Thank you,   Gar Ponto, RN [facility] (on [place]) [phone]

## 2020-01-14 NOTE — Progress Notes (Signed)
Daily Progress Note   Patient Name: Phillip Camacho       Date: 01/14/2020 DOB: [birth year]  Age: 49 y.o. MRN#: [id number] Attending Physician: Shelly Coss, MD Primary Care Physician: [name], MD Admit Date: 12/31/2019  Reason for Consultation/Follow-up: Establishing goals of care  Subjective: Eyes open intermittently. Patient does not track or follow commands. Nonverbal. No s/s of pain or discomfort. Receiving bath from NT.  No family at bedside this AM. VM left for daughter, [name]. Received call back from [place] this afternoon. [name] has arrived from [place] and saw her father last night. She plans to come back to the hospital this afternoon. Discussed plan of care including transition to hospice facility for comfort focused care. Answered questions. [name] understands that hospice referral will be placed now that she has arrived. Discussed visitation policy.   Length of Stay: 14  Current Medications: Scheduled Meds:  . aspirin  300 mg Rectal Daily  . ceFEPime (MAXIPIME) IV  500 mg Intravenous Q24H  . Chlorhexidine Gluconate Cloth  6 each Topical Q0600  . darbepoetin (ARANESP) injection - DIALYSIS  100 mcg Intravenous Q Mon-HD  . sodium chloride flush  3 mL Intravenous Q12H  . ticagrelor  90 mg Per NG tube BID    Continuous Infusions: . sodium chloride Stopped (01/13/20 1540)    PRN Meds: acetaminophen **OR** acetaminophen, glycopyrrolate, hydrALAZINE, HYDROmorphone (DILAUDID) injection, LORazepam, ondansetron **OR** ondansetron (ZOFRAN) IV, oxyCODONE  Physical Exam Vitals and nursing note reviewed.  Constitutional:      Appearance: He is ill-appearing.  HENT:     Head: Normocephalic and atraumatic.  Cardiovascular:     Rate and Rhythm: Regular rhythm.  Pulmonary:   Effort: No tachypnea, accessory muscle usage or respiratory distress.     Breath sounds: Normal breath sounds.     Comments: Room air Abdominal:     Tenderness: There is no abdominal tenderness.  Skin:    General: Skin is warm and dry.  Neurological:     Comments: Eyes open intermittently. Does not follow commands. Nonverbal.   Psychiatric:        Attention and Perception: He is inattentive.        Speech: He is noncommunicative.            Vital Signs: BP (!) 153/57   Pulse 86  Temp 97.6 F (36.4 C) (Axillary)   Resp 16   Ht 5\' 5"  (1.651 m)   Wt 68.9 kg Comment: took equipment off end of bed  SpO2 93%   BMI 25.29 kg/m  SpO2: SpO2: 93 % O2 Device: O2 Device: Room Air O2 Flow Rate: O2 Flow Rate (L/min): 2 L/min  Intake/output summary:   Intake/Output Summary (Last 24 hours) at 01/14/2020 1103 Last data filed at 01/14/2020 1594 Gross per 24 hour  Intake 629.64 ml  Output 600 ml  Net 29.64 ml   LBM: Last BM Date: 01/12/20 Baseline Weight: Weight: 52.6 kg Most recent weight: Weight: 68.9 kg(took equipment off end of bed)       Palliative Assessment/Data: PPS 10%    Flowsheet Rows     Most Recent Value  Intake Tab  Referral Department  Hospitalist  Unit at Time of Referral  Cardiac/Telemetry Unit  Palliative Care Primary Diagnosis  Nephrology  Date Notified  01/08/20  Palliative Care Type  New Palliative care  Reason for referral  Clarify Goals of Care  Date of Admission  12/31/19  Date first seen by Palliative Care  01/10/20  # of days Palliative referral response time  2 Day(s)  # of days IP prior to Palliative referral  8  Clinical Assessment  Psychosocial & Spiritual Assessment  Palliative Care Outcomes      Patient Active Problem List   Diagnosis Date Noted  . ESRD (end stage renal disease) ([place])   . Protein-calorie malnutrition, severe 01/11/2020  . Palliative care encounter   . Palliative care by specialist   . Goals of care,  counseling/discussion   . Pressure injury of skin 01/08/2020  . PAD (peripheral artery disease) ([place]) 12/31/2019  . Pain of left lower extremity due to ischemia   . Hyperlipidemia   . CKD (chronic kidney disease), stage IV ([place]) 11/14/2019  . Hypertension 11/14/2019  . Hyperkalemia 11/14/2019  . 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 Acute renal failure superimposed on stage 4 chronic kidney disease ([place]) 09/17/2019  . Nephrotic syndrome 09/17/2019  . Insulin-requiring or dependent type II diabetes mellitus ([place]) 09/17/2019  . Nephrotic syndrome in diabetes mellitus ([place]) 09/17/2019  . Acute CVA (cerebrovascular accident) ([place]) 08/11/2019  . DM (diabetes mellitus) ([place]) 08/11/2019  . Benign hypertension with chronic kidney disease, stage III 08/11/2019  . Tobacco abuse 08/11/2019  . CKD stage 4 due to type 2 diabetes mellitus ([place]) 08/11/2019    Palliative Care Assessment & Plan   Patient Profile: Phillip Camacho is a 49 y.o. 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  Patient underwent left knee popliteal bypass.  He subsequently required initiation of hemodialysis for ESRD.  Patient subsequently suffered additional strokes as seen on MRI.  He is been persistently encephalopathic and treated on antibiotics in the setting of presumed infection.  He has pulled his NGT making feeding difficult.  Palliative care was consulted to help address goals with family.  Assessment: Critical left leg ischemia s/p left femoral knee popliteal bypass Progressive CKD stage IV now ESRD receiving dialysis Multiple acute embolic strokes Acute encephalopathy Sepsis with fever Leukocytosis Hx of CVA's  Recommendations/Plan:  After [place] discussion 01/11/20, family decision against prolonged  heroic measures and to shift towards comfort measures with transfer to hospice facility.  Residential hospice referral placed.  TOC RN CM notified.  Discontinued interventions not aimed at comfort.   No further dialysis.  Symptom management:  Dilaudid 0.5mg  IV q3h prn pain/dyspnea/air hunger/tachypnea  Ativan 0.5mg  IV q6h prn anxiety  Robinul 0.2mg  IV q4h prn secretions  Unrestricted visitor access  Possible transfer to hospice facility today if bed available.    Code Status:   DNR   Code Status Orders  (From admission, onward)         Start     Ordered   01/11/20 1706  Do not attempt resuscitation (DNR)  Continuous    Question Answer Comment  In the event of cardiac or respiratory ARREST Do not call a "code blue"   In the event of cardiac or respiratory ARREST Do not perform Intubation, CPR, defibrillation or ACLS   In the event of cardiac or respiratory ARREST Use medication by any route, position, wound care, and other measures to relive pain and suffering. May use oxygen, suction and manual treatment of airway obstruction as needed for comfort.      01/11/20 1705        Code Status History    Date Active Date Inactive Code Status Order ID Comments User Context   01/01/2020 1224 01/11/2020 1705 Full Code [id number]  Ortencia Kick Inpatient   12/31/2019 2154 01/01/2020 1224 Full Code [id number]  Vianne Bulls, MD Inpatient   11/14/2019 1656 11/18/2019 2047 Full Code [id number]  Damita Lack, MD ED   09/30/2019 1126 10/02/2019 2131 Full Code [id number]  [name] Roan, MD ED   08/11/2019 2040 08/14/2019 1737 Full Code [id number]  Alma Friendly, MD ED   Advance Care Planning Activity       Prognosis:   Poor prognosis  Discharge Planning:  Hospice facility   Care plan was discussed with RN, daughter [name]), Updated RN CM and Dr. Tawanna Solo via secure epic chat.  Thank you for allowing the Palliative Medicine Team to assist in the care of  this patient.   Time In: 0910 1150- Time Out: 0925 1200 Total Time 25 Prolonged Time Billed  no      Greater than 50%  of this time was spent counseling and coordinating care related to the above assessment and plan.  Ihor Dow, DNP FNP-C Palliative Medicine Team  Phone: [phone] Fax: [phone]  Please contact Palliative Medicine Team phone at [phone] for questions and concerns.

## 2020-01-14 NOTE — Plan of Care (Signed)
Report called to [facility], patient being transported via [place].

## 2020-01-20 ENCOUNTER — Encounter: Payer: 59 | Admitting: Vascular Surgery

## 2020-01-21 ENCOUNTER — Ambulatory Visit (INDEPENDENT_AMBULATORY_CARE_PROVIDER_SITE_OTHER): Payer: 59 | Admitting: *Deleted

## 2020-01-21 ENCOUNTER — Telehealth: Payer: Self-pay

## 2020-01-21 DIAGNOSIS — I639 Cerebral infarction, unspecified: Secondary | ICD-10-CM | POA: Diagnosis not present

## 2020-01-21 LAB — CUP PACEART REMOTE DEVICE CHECK
Date Time Interrogation Session: 20210330230539
Implantable Pulse Generator Implant Date: 20201021

## 2020-01-22 NOTE — Progress Notes (Signed)
ILR Remote 

## 2020-01-22 NOTE — Telephone Encounter (Signed)
Message received from Elberta Marsh/[employer], the staff is trying to determine how to assist the patient's family regarding his disability application denial

## 2020-01-22 DEATH — deceased

## 2020-02-11 ENCOUNTER — Ambulatory Visit: Payer: Self-pay | Admitting: Family Medicine

## 2021-07-08 ENCOUNTER — Other Ambulatory Visit (HOSPITAL_COMMUNITY): Payer: Self-pay

## 2021-11-09 IMAGING — MR MR MRA HEAD W/O CM
2 series · 18 of 48 positions shown · non-contrast
Comparison: Brain MRI performed earlier the same day 09/30/2019,
MRA head 08/12/2019

CLINICAL DATA: Visual loss or uveitis/scleritis.

EXAM:
MRA HEAD WITHOUT CONTRAST
TECHNIQUE: Angiographic images of the Circle of Willis were obtained using MRA
technique without intravenous contrast.

[Series 3: (id) mt fs · axial · 1.4mm · 0.43mm/px · z∈[-62,+57]mm · 17 of 182 slices shown]
[im 1/182]
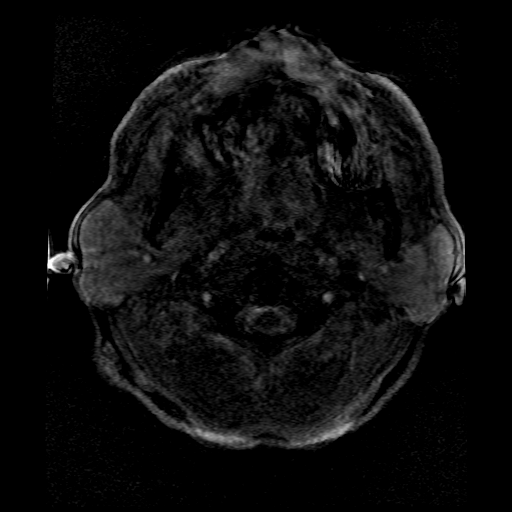
[im 4/182]
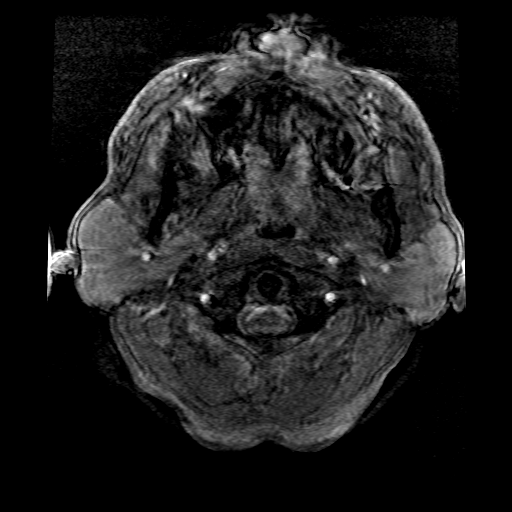
[im 8/182]
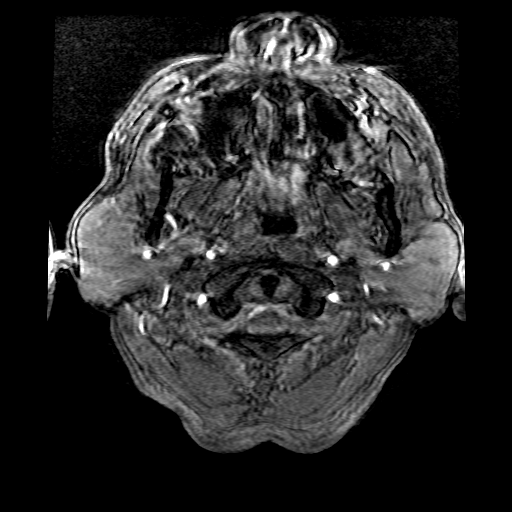
[im 12/182]
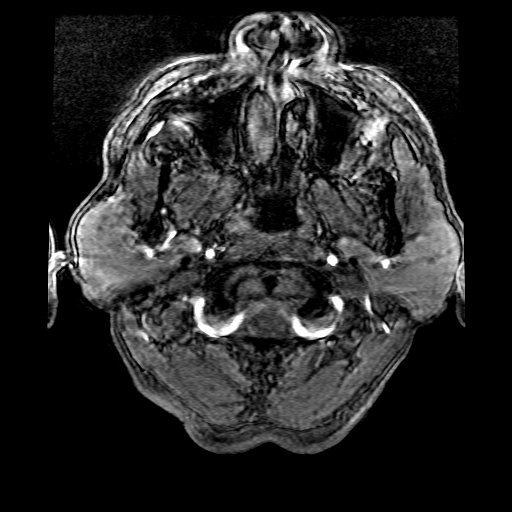
[im 16/182]
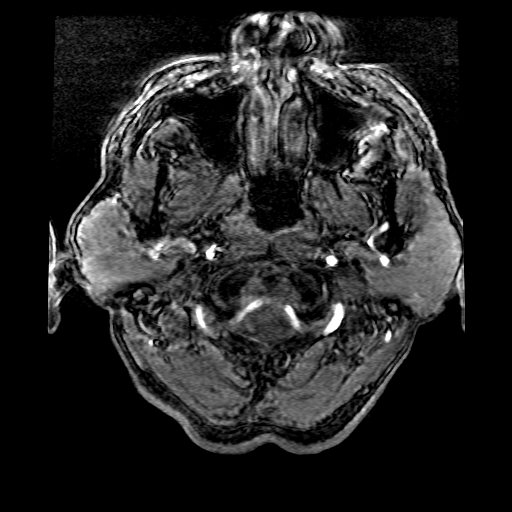
[im 20/182]
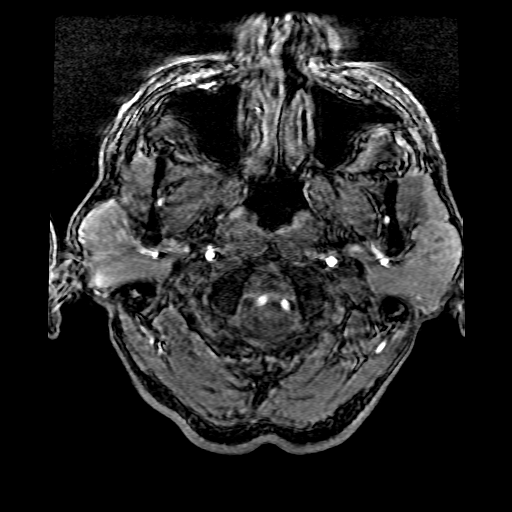
[im 24/182]
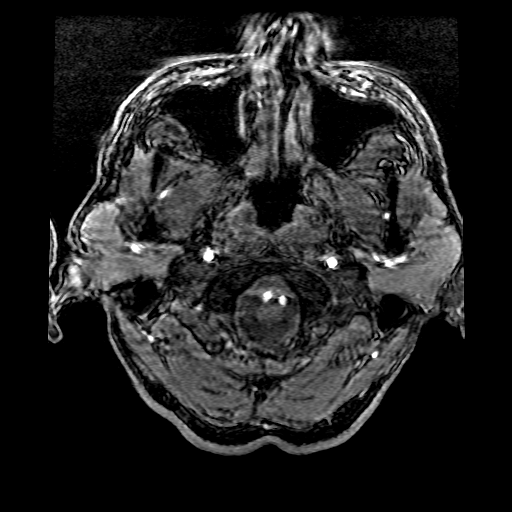
[im 28/182]
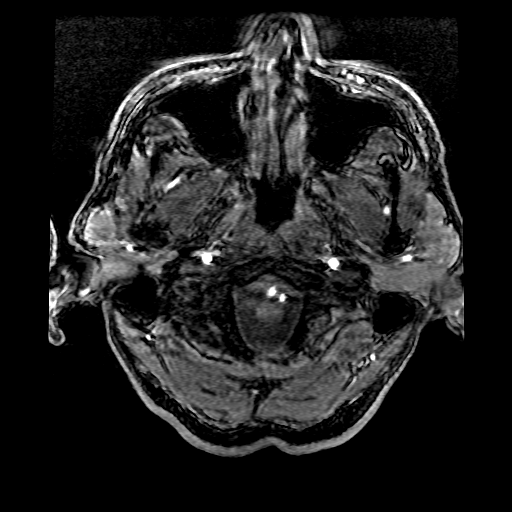
[im 32/182]
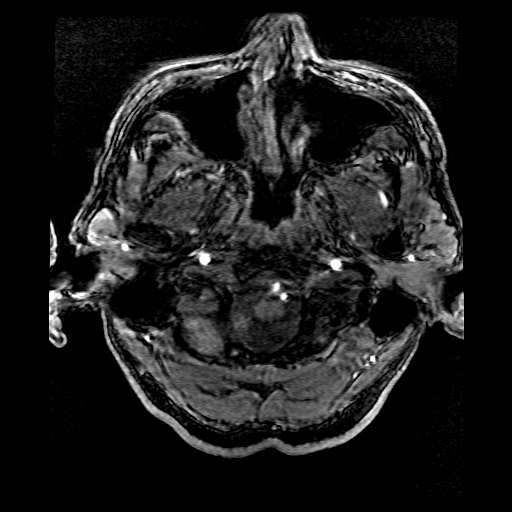
[im 56/182]
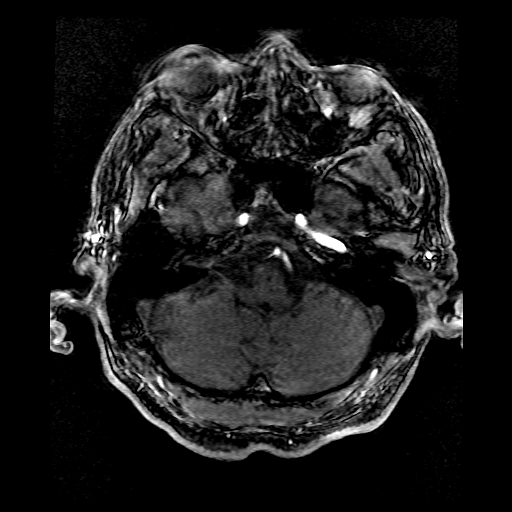
[im 79/182]
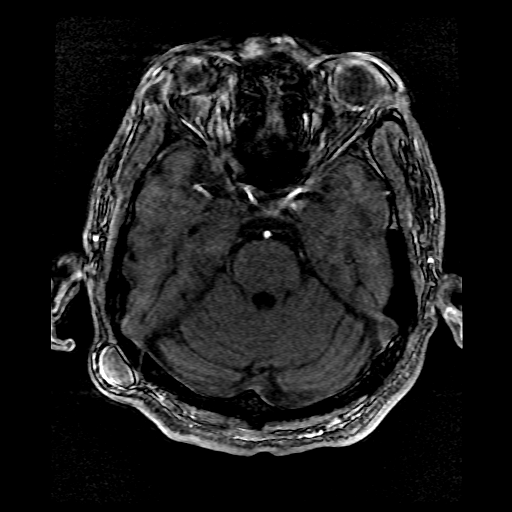
[im 91/182]
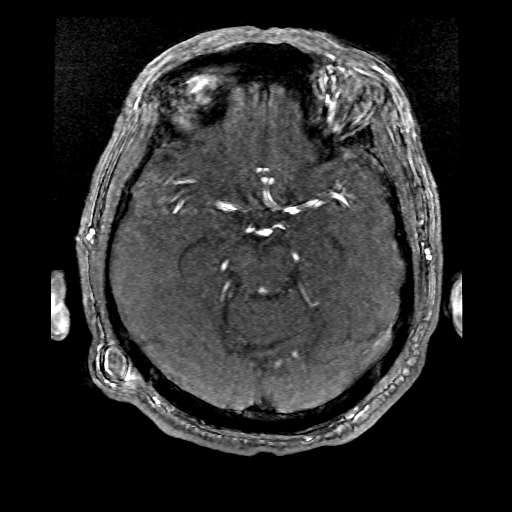
[im 103/182]
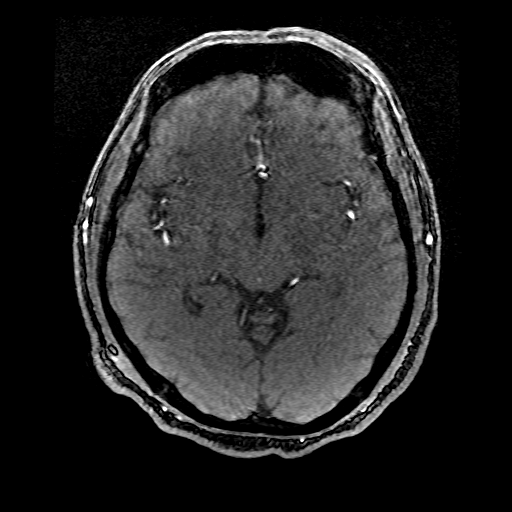
[im 126/182]
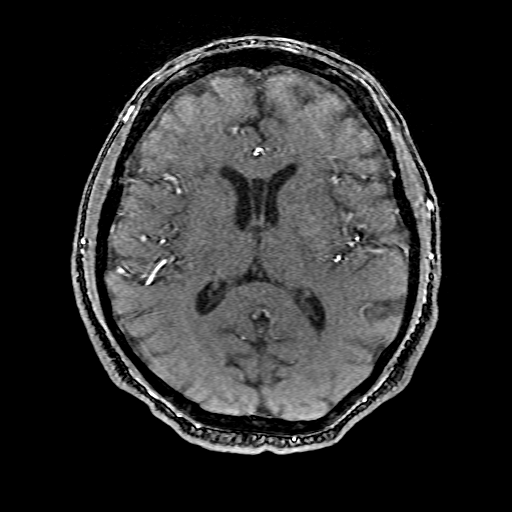
[im 150/182]
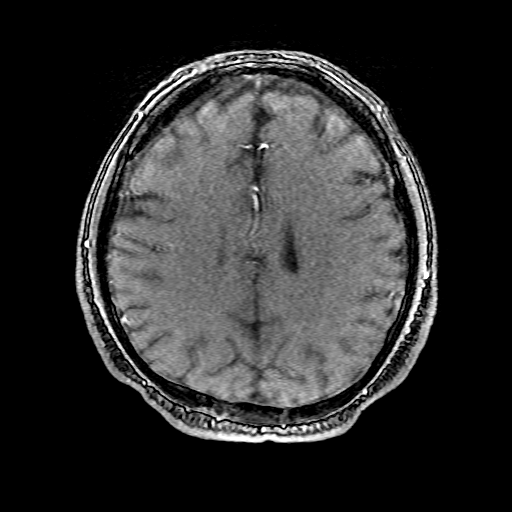
[im 154/182]
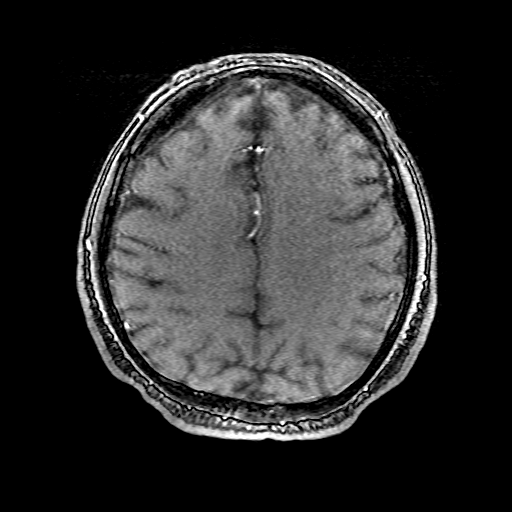
[im 174/182]
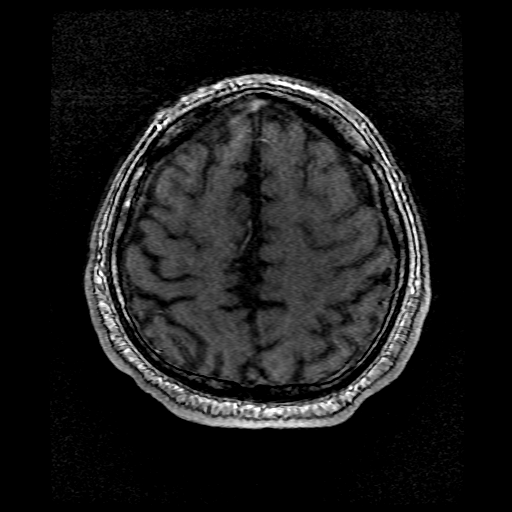

[Series 300: col:(id) mt fs · axial · 1.4mm · 0.43mm/px · 1 of 1 slices shown]
[im 1/1]
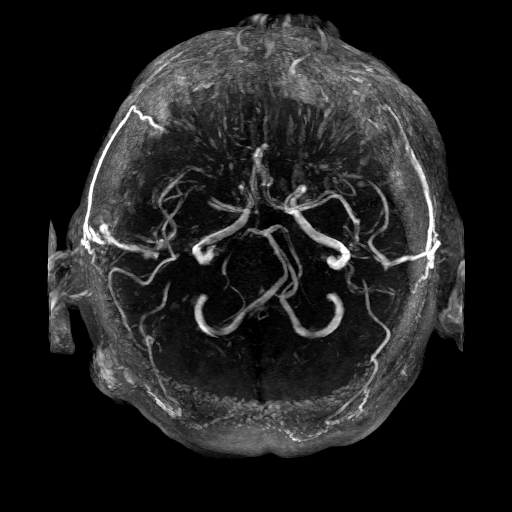

[18 of 48 positions shown; findings below may reference images not displayed]

FINDINGS: The examination is moderate to severely motion degraded, which
limits evaluation for stenoses and for small aneurysms.

The bilateral intracranial internal carotid arteries are patent.
Atherosclerotic irregularity of the cavernous/paraclinoid right ICA
with at least moderate stenosis. No high-grade stenosis identified
within the intracranial left ICA.

The M1 right middle cerebral artery is patent, although marked
motion degradation precludes adequate evaluation for stenosis. No
right MCA M2 proximal branch occlusion is identified.

The M1 left middle cerebral artery is patent without high-grade
stenosis. No left MCA M2 proximal branch occlusion is identified.

The A1 right anterior cerebral artery is poorly delineated, although
this may be related to developmental hypoplasia and significant
motion artifact at this level. The A1 left anterior cerebral artery
is patent without significant stenosis. The A2 and more distal
anterior cerebral arteries are patent bilaterally.

The intracranial vertebral arteries are patent without significant
stenosis, as is the basilar artery. The bilateral posterior cerebral
arteries are patent. Redemonstrated moderate focal stenosis within
the P2 right PCA, as well as high-grade stenosis in the upper branch
of the right PCA more distally.

No intracranial aneurysm is identified.
IMPRESSION: 1. Significantly motion degraded and limited examination, as
detailed.
2. Intracranial atherosclerotic disease with multifocal stenoses,
most notably as follows.
3. Redemonstrated at least moderate stenosis within the
cavernous/paraclinoid right internal carotid artery.
4. The A1 right anterior cerebral artery is poorly delineated,
although this may be related to developmental hypoplasia and
significant motion artifact at this level.
5. Redemonstrated moderate focal stenosis within the P2 right
posterior cerebral artery, as well as high-grade stenosis within the
upper branch of the right PCA more distally.
6. Please note significant motion degradation precludes adequate
evaluation for stenosis within the M1 right middle cerebral artery.
